# Patient Record
Sex: Female | Born: 1950
Health system: Southern US, Community
[De-identification: ages and names within clinical notes are randomized; demographics above are authoritative.]

## PROBLEM LIST (undated history)

## (undated) DIAGNOSIS — Z8711 Personal history of peptic ulcer disease: Secondary | ICD-10-CM

## (undated) DIAGNOSIS — K829 Disease of gallbladder, unspecified: Secondary | ICD-10-CM

## (undated) DIAGNOSIS — N979 Female infertility, unspecified: Secondary | ICD-10-CM

## (undated) DIAGNOSIS — Z9289 Personal history of other medical treatment: Secondary | ICD-10-CM

## (undated) DIAGNOSIS — Z9889 Other specified postprocedural states: Secondary | ICD-10-CM

## (undated) DIAGNOSIS — I493 Ventricular premature depolarization: Secondary | ICD-10-CM

## (undated) DIAGNOSIS — K219 Gastro-esophageal reflux disease without esophagitis: Secondary | ICD-10-CM

## (undated) DIAGNOSIS — H8110 Benign paroxysmal vertigo, unspecified ear: Secondary | ICD-10-CM

## (undated) DIAGNOSIS — E039 Hypothyroidism, unspecified: Secondary | ICD-10-CM

## (undated) DIAGNOSIS — K589 Irritable bowel syndrome without diarrhea: Secondary | ICD-10-CM

## (undated) DIAGNOSIS — Z8659 Personal history of other mental and behavioral disorders: Secondary | ICD-10-CM

## (undated) DIAGNOSIS — E559 Vitamin D deficiency, unspecified: Secondary | ICD-10-CM

## (undated) DIAGNOSIS — R202 Paresthesia of skin: Secondary | ICD-10-CM

## (undated) DIAGNOSIS — M545 Low back pain, unspecified: Secondary | ICD-10-CM

## (undated) DIAGNOSIS — H8109 Meniere's disease, unspecified ear: Secondary | ICD-10-CM

## (undated) DIAGNOSIS — E88819 Insulin resistance, unspecified: Secondary | ICD-10-CM

## (undated) DIAGNOSIS — I491 Atrial premature depolarization: Secondary | ICD-10-CM

## (undated) DIAGNOSIS — I10 Essential (primary) hypertension: Secondary | ICD-10-CM

## (undated) DIAGNOSIS — Z87898 Personal history of other specified conditions: Secondary | ICD-10-CM

## (undated) DIAGNOSIS — T8859XA Other complications of anesthesia, initial encounter: Secondary | ICD-10-CM

## (undated) DIAGNOSIS — E785 Hyperlipidemia, unspecified: Secondary | ICD-10-CM

## (undated) DIAGNOSIS — E8881 Metabolic syndrome: Secondary | ICD-10-CM

## (undated) DIAGNOSIS — R7303 Prediabetes: Secondary | ICD-10-CM

## (undated) DIAGNOSIS — M25559 Pain in unspecified hip: Secondary | ICD-10-CM

## (undated) DIAGNOSIS — R112 Nausea with vomiting, unspecified: Secondary | ICD-10-CM

## (undated) DIAGNOSIS — K635 Polyp of colon: Secondary | ICD-10-CM

## (undated) DIAGNOSIS — F419 Anxiety disorder, unspecified: Secondary | ICD-10-CM

## (undated) HISTORY — DX: Benign paroxysmal vertigo, unspecified ear: H81.10

## (undated) HISTORY — DX: Meniere's disease, unspecified ear: H81.09

## (undated) HISTORY — DX: Personal history of other mental and behavioral disorders: Z86.59

## (undated) HISTORY — DX: Atrial premature depolarization: I49.1

## (undated) HISTORY — DX: Hypothyroidism, unspecified: E03.9

## (undated) HISTORY — DX: Ventricular premature depolarization: I49.3

## (undated) HISTORY — DX: Personal history of other medical treatment: Z92.89

## (undated) HISTORY — DX: Vitamin D deficiency, unspecified: E55.9

## (undated) HISTORY — DX: Disease of gallbladder, unspecified: K82.9

## (undated) HISTORY — PX: REFRACTIVE SURGERY: SHX103

## (undated) HISTORY — DX: Pain in unspecified hip: M25.559

## (undated) HISTORY — DX: Personal history of other specified conditions: Z87.898

## (undated) HISTORY — DX: Paresthesia of skin: R20.2

## (undated) HISTORY — DX: Gastro-esophageal reflux disease without esophagitis: K21.9

## (undated) HISTORY — DX: Low back pain, unspecified: M54.50

## (undated) HISTORY — DX: Anxiety disorder, unspecified: F41.9

## (undated) HISTORY — PX: REDUCTION MAMMAPLASTY: SUR839

## (undated) HISTORY — DX: Insulin resistance, unspecified: E88.819

## (undated) HISTORY — DX: Prediabetes: R73.03

## (undated) HISTORY — DX: Essential (primary) hypertension: I10

## (undated) HISTORY — DX: Polyp of colon: K63.5

## (undated) HISTORY — DX: Female infertility, unspecified: N97.9

## (undated) HISTORY — DX: Irritable bowel syndrome, unspecified: K58.9

## (undated) HISTORY — DX: Other specified postprocedural states: Z98.890

## (undated) HISTORY — PX: OTHER SURGICAL HISTORY: SHX169

## (undated) HISTORY — DX: Hyperlipidemia, unspecified: E78.5

## (undated) HISTORY — DX: Personal history of peptic ulcer disease: Z87.11

## (undated) HISTORY — DX: Metabolic syndrome: E88.81

---
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
## 1973-10-07 HISTORY — PX: TONSILLECTOMY: SUR1361

## 1984-10-07 HISTORY — PX: FOOT NEUROMA SURGERY: SHX646

## 1994-10-07 HISTORY — PX: TOTAL ABDOMINAL HYSTERECTOMY: SHX209

## 1995-10-08 HISTORY — PX: BLADDER SURGERY: SHX569

## 1997-10-07 HISTORY — PX: BREAST REDUCTION SURGERY: SHX8

## 2009-10-07 HISTORY — PX: CHOLECYSTECTOMY: SHX55

## 2010-10-07 HISTORY — PX: THYROIDECTOMY: SHX17

## 2011-10-08 HISTORY — PX: BREAST BIOPSY: SHX20

## 2013-03-30 ENCOUNTER — Encounter: Payer: Self-pay | Admitting: Family Medicine

## 2013-07-03 LAB — HM PAP SMEAR: HM PAP: NEGATIVE

## 2013-08-18 ENCOUNTER — Encounter: Payer: Self-pay | Admitting: Family Medicine

## 2014-10-07 LAB — HM COLONOSCOPY

## 2015-05-08 HISTORY — PX: CARDIAC CATHETERIZATION: SHX172

## 2015-07-10 ENCOUNTER — Encounter: Payer: Self-pay | Admitting: Family Medicine

## 2015-07-17 ENCOUNTER — Encounter: Payer: Self-pay | Admitting: Family Medicine

## 2015-08-20 HISTORY — PX: COLONOSCOPY: SHX174

## 2016-01-24 ENCOUNTER — Encounter: Payer: Self-pay | Admitting: Family Medicine

## 2016-01-24 LAB — HM MAMMOGRAPHY

## 2016-07-08 LAB — HM DEXA SCAN

## 2017-01-13 HISTORY — PX: ESOPHAGOGASTRODUODENOSCOPY: SHX1529

## 2017-01-27 ENCOUNTER — Encounter: Payer: Self-pay | Admitting: Family Medicine

## 2017-08-05 LAB — FECAL OCCULT BLOOD, GUAIAC: FECAL OCCULT BLD: NEGATIVE

## 2017-10-03 ENCOUNTER — Encounter: Payer: Self-pay | Admitting: Family Medicine

## 2017-11-27 LAB — BASIC METABOLIC PANEL
BUN: 12 (ref 4–21)
CREATININE: 0.7 (ref 0.5–1.1)
Glucose: 78
POTASSIUM: 3.5 (ref 3.4–5.3)
SODIUM: 143 (ref 137–147)

## 2017-11-27 LAB — HEPATIC FUNCTION PANEL
ALT: 43 — AB (ref 7–35)
AST: 30 (ref 13–35)
Alkaline Phosphatase: 82 (ref 25–125)
Bilirubin, Total: 0.4

## 2017-11-27 LAB — LIPID PANEL
CHOLESTEROL: 186 (ref 0–200)
HDL: 61 (ref 35–70)
LDL Cholesterol: 92
Triglycerides: 164 — AB (ref 40–160)

## 2017-11-27 LAB — CBC AND DIFFERENTIAL
HEMATOCRIT: 41 (ref 36–46)
HEMOGLOBIN: 13.3 (ref 12.0–16.0)
Neutrophils Absolute: 3
Platelets: 248 (ref 150–399)
WBC: 6.4

## 2017-11-27 LAB — HEMOGLOBIN A1C: HEMOGLOBIN A1C: 5.7

## 2017-11-27 LAB — TSH: TSH: 0.49 (ref 0.41–5.90)

## 2018-02-25 ENCOUNTER — Ambulatory Visit: Payer: Self-pay | Admitting: Family Medicine

## 2018-02-25 ENCOUNTER — Encounter: Payer: Self-pay | Admitting: Family Medicine

## 2018-02-25 VITALS — BP 128/78 | HR 68 | Temp 98.6°F | Ht 64.0 in | Wt 170.0 lb

## 2018-02-25 DIAGNOSIS — R42 Dizziness and giddiness: Secondary | ICD-10-CM | POA: Diagnosis not present

## 2018-02-25 DIAGNOSIS — M79672 Pain in left foot: Secondary | ICD-10-CM

## 2018-02-25 DIAGNOSIS — E1159 Type 2 diabetes mellitus with other circulatory complications: Secondary | ICD-10-CM

## 2018-02-25 DIAGNOSIS — E1169 Type 2 diabetes mellitus with other specified complication: Secondary | ICD-10-CM

## 2018-02-25 DIAGNOSIS — I10 Essential (primary) hypertension: Secondary | ICD-10-CM

## 2018-02-25 DIAGNOSIS — E119 Type 2 diabetes mellitus without complications: Secondary | ICD-10-CM

## 2018-02-25 DIAGNOSIS — E039 Hypothyroidism, unspecified: Secondary | ICD-10-CM

## 2018-02-25 DIAGNOSIS — E785 Hyperlipidemia, unspecified: Secondary | ICD-10-CM

## 2018-02-25 DIAGNOSIS — I152 Hypertension secondary to endocrine disorders: Secondary | ICD-10-CM

## 2018-02-25 DIAGNOSIS — I493 Ventricular premature depolarization: Secondary | ICD-10-CM

## 2018-02-25 DIAGNOSIS — H579 Unspecified disorder of eye and adnexa: Secondary | ICD-10-CM

## 2018-02-25 NOTE — Progress Notes (Signed)
Mary Hernandez is a 67 y.o. female is here for TO ESTABLISH CARE.  History of Present Illness:   Lonell Grandchild, CMA acting as scribe for Dr. [name].   QVZ:DGLOVFI in office to establish care. She moved here from out of town to be closer to family. She was referred to our office by her daughter who is a nutritionist in the area.    Foot pain: her orthopedics in home town wanted her to have surgery to remove extra bone on foot. She would like to get referral to have addressed not what she has moved.   Vertigo:  has been evaluated by specialist in 2006 and has been on several medications that help with that. She feels like it has been going well but was interested in getting some PT to help with that.   Health Maintenance Due  Topic Date Due  . HEMOGLOBIN A1C  [birth year]  . Hepatitis C Screening  1951/06/19  . FOOT EXAM  06/22/1962  . OPHTHALMOLOGY EXAM  06/22/1962  . HIV Screening  06/23/1967  . TETANUS/TDAP  06/23/1971  . PAP SMEAR  06/22/1973  . MAMMOGRAM  06/22/2002  . COLONOSCOPY  06/22/2002  . DEXA SCAN  06/22/2017  . PNA vac Low Risk Adult (1 of 2 - PCV13) 06/22/2017     Depression screen PHQ 2/9 02/25/2018  Decreased Interest 0  Down, Depressed, Hopeless 0  PHQ - 2 Score 0     PMHx, SurgHx, SocialHx, FamHx, Medications, and Allergies were reviewed in the Visit Navigator and updated as appropriate.   Patient Active Problem List   Diagnosis Date Noted  . Type 2 diabetes mellitus without complication, without long-term current use of insulin ([place]) 03/01/2018  . Hypertension associated with diabetes ([place]) 03/01/2018  . Hyperlipidemia associated with type 2 diabetes mellitus ([place]) 03/01/2018  . Acquired hypothyroidism 03/01/2018  . Vertigo 03/01/2018  . Frequent PVCs 03/01/2018   Social History   Tobacco Use  . Smoking status: Never Smoker  . Smokeless tobacco: Never Used  Substance Use Topics  . Alcohol use: Yes    Comment: very limited   . Drug use: Not  on file   Current Medications and Allergies:   Current Outpatient Medications:  .  acebutolol (SECTRAL) 200 MG capsule, Take 200 mg by mouth daily., Disp: , Rfl:  .  benzonatate (TESSALON) 100 MG capsule, Take by mouth 3 (three) times daily as needed for cough., Disp: , Rfl:  .  Calcium Citrate-Vitamin D 315-250 MG-UNIT TABS, Take by mouth., Disp: , Rfl:  .  cetirizine (ZYRTEC) 10 MG tablet, Take 10 mg by mouth daily., Disp: , Rfl:  .  Evening Primrose Oil 1000 MG CAPS, Take 1,300 mg by mouth., Disp: , Rfl:  .  EVENING PRIMROSE OIL PO, Take 1,500 mg by mouth 2 (two) times daily., Disp: , Rfl:  .  flecainide (TAMBOCOR) 100 MG tablet, Take 100 mg by mouth 2 (two) times daily., Disp: , Rfl:  .  Glucosamine-Chondroit-Vit C-Mn (GLUCOSAMINE 1500 COMPLEX) CAPS, Take by mouth., Disp: , Rfl:  .  glycopyrrolate (ROBINUL) 1 MG tablet, Take 1 mg by mouth 2 (two) times daily., Disp: , Rfl:  .  levothyroxine (SYNTHROID, LEVOTHROID) 125 MCG tablet, Take 125 mcg by mouth daily before breakfast., Disp: , Rfl:  .  omega-3 acid ethyl esters (LOVAZA) 1 g capsule, Take 1 g by mouth 2 (two) times daily., Disp: , Rfl:  .  ondansetron (ZOFRAN) 8 MG tablet, Take by mouth every 8 (  eight) hours as needed for nausea or vomiting., Disp: , Rfl:  .  potassium chloride (K-DUR,KLOR-CON) 10 MEQ tablet, Take 10 mEq by mouth 2 (two) times daily., Disp: , Rfl:  .  pravastatin (PRAVACHOL) 10 MG tablet, Take 10 mg by mouth daily., Disp: , Rfl:  .  Probiotic Product (PROBIOTIC-10 PO), Take by mouth., Disp: , Rfl:  .  Semaglutide (OZEMPIC) 0.25 or 0.5 MG/DOSE SOPN, Inject 0.5 mg into the skin once a week., Disp: , Rfl:  .  triamterene-hydrochlorothiazide (DYAZIDE) 37.5-25 MG capsule, Take 1 capsule by mouth daily., Disp: , Rfl:   Allergies  Allergen Reactions  . Adhesive [Tape] Rash   Review of Systems   Pertinent items are noted in the HPI. Otherwise, ROS is negative.  Vitals:   Vitals:   02/25/18 1343  BP: 128/78    Pulse: 68  Temp: 98.6 F (37 C)  TempSrc: Oral  SpO2: 96%  Weight: 170 lb (77.1 kg)  Height: 5\' 4"  (1.626 m)     Body mass index is 29.18 kg/m.  Physical Exam:   Physical Exam  Constitutional: She is oriented to person, place, and time. She appears well-developed and well-nourished. No distress.  HENT:  Head: Normocephalic and atraumatic.  Right Ear: External ear normal.  Left Ear: External ear normal.  Nose: Nose normal.  Mouth/Throat: Oropharynx is clear and moist.  Eyes: Pupils are equal, round, and reactive to light. Conjunctivae and EOM are normal.  Neck: Normal range of motion. Neck supple. No thyromegaly present.  Cardiovascular: Normal rate, regular rhythm, normal heart sounds and intact distal pulses.  Pulmonary/Chest: Effort normal and breath sounds normal.  Abdominal: Soft. Bowel sounds are normal.  Musculoskeletal: Normal range of motion.  Lymphadenopathy:    She has no cervical adenopathy.  Neurological: She is alert and oriented to person, place, and time.  Skin: Skin is warm and dry. Capillary refill takes less than 2 seconds.  Psychiatric: She has a normal mood and affect. Her behavior is normal.  Nursing note and vitals reviewed.   Assessment and Plan:   [name] was seen today for establish care.  Diagnoses and all orders for this visit:  Frequent PVCs -     Ambulatory referral to Cardiology  Eye exam abnormal -     Ambulatory referral to Ophthalmology  Left foot pain -     Ambulatory referral to Orthopedic Surgery  Vertigo  Acquired hypothyroidism  Hyperlipidemia associated with type 2 diabetes mellitus ([place])  Hypertension associated with diabetes ([place])  Type 2 diabetes mellitus without complication, without long-term current use of insulin ([place])   . Reviewed expectations re: course of current medical issues. . Discussed self-management of symptoms. . Outlined signs and symptoms indicating need for more acute intervention. . Patient  verbalized understanding and all questions were answered. Marland Kitchen Health Maintenance issues including appropriate healthy diet, exercise, and smoking avoidance were discussed with patient. . See orders for this visit as documented in the electronic medical record. . Patient received an After Visit Summary.  [name], DO [place] 03/01/2018  Future Appointments  Date Time Provider [place]  03/09/2018  8:30 AM Newt Minion, MD [facility] None  08/28/2018 10:00 AM [name], DO [facility]   CMA served as scribe during this visit. History, Physical, and Plan performed by medical provider. The above documentation has been reviewed and is accurate and complete. [name], D.O.  Records requested if needed. Time spent with the patient: 30 minutes, of  which >50% was spent in obtaining information about her symptoms, reviewing her previous labs, evaluations, and treatments, counseling her about her condition (please see the discussed topics above), and developing a plan to further investigate it; she had a number of questions which I addressed.

## 2018-02-27 ENCOUNTER — Telehealth: Payer: Self-pay | Admitting: Family Medicine

## 2018-02-27 NOTE — Telephone Encounter (Signed)
I am unsure what the patient is calling about- this referral has not been sent to any office yet. If patient has/had a preference I will send it to whichever office she prefers.

## 2018-02-27 NOTE — Telephone Encounter (Signed)
I have updated referral sorry

## 2018-02-27 NOTE — Telephone Encounter (Unsigned)
Copied from [place] [id number]. Topic: Quick Communication - See Telephone Encounter >> Feb 27, 2018 10:19 AM Neva Seat wrote: Pt called Dr. Schuyler Amor - ophthalmology group [phone] ) -  they stated he is not a doctor in this group. Pt wanting to verify the correct number and if her information went to the correct office. Please call pt back to confirm asap.

## 2018-03-01 ENCOUNTER — Encounter: Payer: Self-pay | Admitting: Family Medicine

## 2018-03-01 DIAGNOSIS — E785 Hyperlipidemia, unspecified: Secondary | ICD-10-CM | POA: Insufficient documentation

## 2018-03-01 DIAGNOSIS — E782 Mixed hyperlipidemia: Secondary | ICD-10-CM | POA: Insufficient documentation

## 2018-03-01 DIAGNOSIS — I493 Ventricular premature depolarization: Secondary | ICD-10-CM | POA: Insufficient documentation

## 2018-03-01 DIAGNOSIS — I10 Essential (primary) hypertension: Secondary | ICD-10-CM

## 2018-03-01 DIAGNOSIS — E039 Hypothyroidism, unspecified: Secondary | ICD-10-CM

## 2018-03-01 DIAGNOSIS — R42 Dizziness and giddiness: Secondary | ICD-10-CM

## 2018-03-01 DIAGNOSIS — H8109 Meniere's disease, unspecified ear: Secondary | ICD-10-CM | POA: Insufficient documentation

## 2018-03-01 DIAGNOSIS — H8110 Benign paroxysmal vertigo, unspecified ear: Secondary | ICD-10-CM

## 2018-03-01 HISTORY — DX: Hypothyroidism, unspecified: E03.9

## 2018-03-01 HISTORY — DX: Essential (primary) hypertension: I10

## 2018-03-01 HISTORY — DX: Ventricular premature depolarization: I49.3

## 2018-03-01 HISTORY — DX: Benign paroxysmal vertigo, unspecified ear: H81.10

## 2018-03-01 HISTORY — DX: Dizziness and giddiness: R42

## 2018-03-06 ENCOUNTER — Encounter: Payer: Self-pay | Admitting: Family Medicine

## 2018-03-09 ENCOUNTER — Encounter: Payer: Self-pay | Admitting: Family Medicine

## 2018-03-09 ENCOUNTER — Ambulatory Visit (INDEPENDENT_AMBULATORY_CARE_PROVIDER_SITE_OTHER): Payer: PPO | Admitting: Orthopedic Surgery

## 2018-03-09 ENCOUNTER — Encounter (INDEPENDENT_AMBULATORY_CARE_PROVIDER_SITE_OTHER): Payer: Self-pay | Admitting: Orthopedic Surgery

## 2018-03-09 ENCOUNTER — Telehealth: Payer: Self-pay

## 2018-03-09 VITALS — Ht 64.0 in | Wt 170.0 lb

## 2018-03-09 DIAGNOSIS — E1142 Type 2 diabetes mellitus with diabetic polyneuropathy: Secondary | ICD-10-CM

## 2018-03-09 DIAGNOSIS — M6702 Short Achilles tendon (acquired), left ankle: Secondary | ICD-10-CM | POA: Diagnosis not present

## 2018-03-09 NOTE — Telephone Encounter (Signed)
Called patient she is not having issue any longer will call if any new problems.

## 2018-03-09 NOTE — Telephone Encounter (Signed)
Sent referral to scheduling attached notes to July file  

## 2018-03-09 NOTE — Progress Notes (Signed)
Office Visit Note   Patient: Mary Hernandez           Date of Birth: 1951/04/24           MRN: [id number] Visit Date: 03/09/2018              Requested by: [name], [address] [place], [place] [address] PCP: [name], DO  Chief Complaint  Patient presents with  . Left Foot - Pain      HPI: Patient is a 67 year old woman who presents complaining of pain base of the fifth metatarsal left foot.  She has been in a fracture boot she was seen in [place] an MRI scan was obtained and her treating physician felt that she was suffering from peroneal tendinitis from an os trigonum.  Patient's symptoms are on the plantar aspect of the fifth metatarsal base.  Assessment & Plan: Visit Diagnoses:  1. Achilles tendon contracture, left     Plan: Patient was given instructions of how to modify her orthotic to allow for plantarflexion of the first ray to unload the lateral column and she was given instruction and demonstrated Achilles stretching to do 5 times a day a minute at a time she is also to get a stiff soled Trail running sneaker.  Follow-Up Instructions: Return in about 2 months (around 05/09/2018).   Ortho Exam  Patient is alert, oriented, no adenopathy, well-dressed, normal affect, normal respiratory effort. Examination patient has a good dorsalis pedis and posterior tibial pulse she has good subtalar motion she has heel cord contracture with dorsiflexion about 10 degrees short of neutral with her knee extended she has good dorsiflexion of the knee flexed.  Patient has some tenderness to palpation over the third webspace this is where she has had a neuroma excised.  She is tender to palpation the plantar aspect of the base of the fifth metatarsal the peroneal tendons are nontender to palpation.  The Achilles tendon is nontender to palpation her os trigonum which is posterior medially is a little tender to palpation but is asymptomatic and not involved with her fifth  metatarsal base pain.  Patient's MRI scan was reviewed which showed no peroneal tendinitis or posterior tibial tendinitis the Achilles tendon is intact there is no evidence of any stress fractures.  Imaging: No results found. No images are attached to the encounter.  Labs: No results found for: HGBA1C, ESRSEDRATE, CRP, LABURIC, REPTSTATUS, GRAMSTAIN, CULT, LABORGA   No results found for: ALBUMIN, PREALBUMIN, LABURIC  Body mass index is 29.18 kg/m.  Orders:  No orders of the defined types were placed in this encounter.  No orders of the defined types were placed in this encounter.    Procedures: No procedures performed  Clinical Data: No additional findings.  ROS:  All other systems negative, except as noted in the HPI. Review of Systems  Objective: Vital Signs: Ht 5\' 4"  (1.626 m)   Wt 170 lb (77.1 kg)   BMI 29.18 kg/m   Specialty Comments:  No specialty comments available.  PMFS History: Patient Active Problem List   Diagnosis Date Noted  . Type 2 diabetes mellitus without complication, without long-term current use of insulin ([place]) 03/01/2018  . Hypertension associated with diabetes ([place]) 03/01/2018  . Hyperlipidemia associated with type 2 diabetes mellitus ([place]) 03/01/2018  . Acquired hypothyroidism 03/01/2018  . Vertigo 03/01/2018  . Frequent PVCs 03/01/2018   Past Medical History:  Diagnosis Date  . Acquired hypothyroidism 03/01/2018  . Frequent PVCs 03/01/2018  .  Hyperlipidemia associated with type 2 diabetes mellitus ([place]) 03/01/2018  . Hypertension associated with diabetes ([place]) 03/01/2018  . Type 2 diabetes mellitus without complication, without long-term current use of insulin ([place]) 03/01/2018  . Vertigo 03/01/2018    Family History  Problem Relation Age of Onset  . Cancer Mother   . Heart attack Father   . Early death Sister   . Depression Brother   . Hearing loss Brother   . Diabetes Daughter     Past Surgical History:  Procedure Laterality  Date  . BREAST BIOPSY  2013  . BREAST REDUCTION SURGERY  1999  . CHOLECYSTECTOMY  2011  . [place]  . S/P Eye Right 1967   Muscle Clipped   . THYROIDECTOMY  2012  . TONSILLECTOMY  1975  . TOTAL ABDOMINAL HYSTERECTOMY  1996   Social History   Occupational History  . Not on file  Tobacco Use  . Smoking status: Never Smoker  . Smokeless tobacco: Never Used  Substance and Sexual Activity  . Alcohol use: Yes    Comment: very limited   . Drug use: Not on file  . Sexual activity: Not on file

## 2018-03-19 ENCOUNTER — Other Ambulatory Visit: Payer: Self-pay

## 2018-03-19 ENCOUNTER — Encounter: Payer: Self-pay | Admitting: Family Medicine

## 2018-03-23 ENCOUNTER — Other Ambulatory Visit: Payer: Self-pay

## 2018-03-23 MED ORDER — SEMAGLUTIDE (1 MG/DOSE) 2 MG/1.5ML ~~LOC~~ SOPN: 1.0000 mg | PEN_INJECTOR | SUBCUTANEOUS | 1 refills | Status: DC

## 2018-03-24 ENCOUNTER — Encounter: Payer: Self-pay | Admitting: Family Medicine

## 2018-03-25 ENCOUNTER — Encounter: Payer: Self-pay | Admitting: Family Medicine

## 2018-03-25 DIAGNOSIS — H2513 Age-related nuclear cataract, bilateral: Secondary | ICD-10-CM | POA: Diagnosis not present

## 2018-03-25 DIAGNOSIS — H35371 Puckering of macula, right eye: Secondary | ICD-10-CM | POA: Diagnosis not present

## 2018-03-25 DIAGNOSIS — Z9889 Other specified postprocedural states: Secondary | ICD-10-CM | POA: Diagnosis not present

## 2018-03-26 ENCOUNTER — Telehealth: Payer: Self-pay | Admitting: Physician Assistant

## 2018-03-26 NOTE — Telephone Encounter (Signed)
Records received from [facility]. Pt appointment with Richardson Dopp PA-C. Placed in Chart prep.

## 2018-03-27 ENCOUNTER — Other Ambulatory Visit: Payer: Self-pay

## 2018-03-27 LAB — HM DIABETES EYE EXAM

## 2018-03-27 MED ORDER — POTASSIUM CHLORIDE CRYS ER 10 MEQ PO TBCR: 10.0000 meq | EXTENDED_RELEASE_TABLET | Freq: Two times a day (BID) | ORAL | 1 refills | Status: DC

## 2018-03-27 MED ORDER — FLECAINIDE ACETATE 100 MG PO TABS: 100.0000 mg | ORAL_TABLET | Freq: Two times a day (BID) | ORAL | 3 refills | Status: DC

## 2018-03-27 MED ORDER — LEVOTHYROXINE SODIUM 125 MCG PO TABS: 125.0000 ug | ORAL_TABLET | Freq: Every day | ORAL | 1 refills | Status: DC

## 2018-03-30 ENCOUNTER — Encounter: Payer: Self-pay | Admitting: Surgical

## 2018-03-30 ENCOUNTER — Encounter: Payer: Self-pay | Admitting: Family Medicine

## 2018-03-30 DIAGNOSIS — M1611 Unilateral primary osteoarthritis, right hip: Secondary | ICD-10-CM | POA: Insufficient documentation

## 2018-03-30 DIAGNOSIS — N63 Unspecified lump in unspecified breast: Secondary | ICD-10-CM | POA: Insufficient documentation

## 2018-03-30 DIAGNOSIS — K219 Gastro-esophageal reflux disease without esophagitis: Secondary | ICD-10-CM | POA: Insufficient documentation

## 2018-03-30 DIAGNOSIS — K5790 Diverticulosis of intestine, part unspecified, without perforation or abscess without bleeding: Secondary | ICD-10-CM | POA: Insufficient documentation

## 2018-03-30 DIAGNOSIS — K2960 Other gastritis without bleeding: Secondary | ICD-10-CM | POA: Insufficient documentation

## 2018-03-31 ENCOUNTER — Encounter: Payer: Self-pay | Admitting: Surgical

## 2018-04-01 ENCOUNTER — Encounter: Payer: Self-pay | Admitting: Family Medicine

## 2018-04-01 ENCOUNTER — Encounter: Payer: Self-pay | Admitting: Physician Assistant

## 2018-04-01 NOTE — Telephone Encounter (Signed)
Please check to see if the patient's records have arrived and let her know. Thanks Richardson Dopp, PA-C    04/01/2018 5:17 PM

## 2018-04-03 ENCOUNTER — Encounter (INDEPENDENT_AMBULATORY_CARE_PROVIDER_SITE_OTHER): Payer: Self-pay | Admitting: Orthopedic Surgery

## 2018-04-03 ENCOUNTER — Encounter: Payer: Self-pay | Admitting: Family Medicine

## 2018-04-03 ENCOUNTER — Ambulatory Visit (INDEPENDENT_AMBULATORY_CARE_PROVIDER_SITE_OTHER): Payer: PPO | Admitting: Orthopedic Surgery

## 2018-04-03 VITALS — Ht 64.0 in | Wt 170.0 lb

## 2018-04-03 DIAGNOSIS — E1142 Type 2 diabetes mellitus with diabetic polyneuropathy: Secondary | ICD-10-CM

## 2018-04-03 DIAGNOSIS — M6702 Short Achilles tendon (acquired), left ankle: Secondary | ICD-10-CM | POA: Diagnosis not present

## 2018-04-03 NOTE — Progress Notes (Signed)
Office Visit Note   Patient: Mary Hernandez           Date of Birth: [birth year]           MRN: [id number] Visit Date: 04/03/2018              Requested by: [name], [address] [place], [place] [address] PCP: [name], DO  Chief Complaint  Patient presents with  . Left Foot - Pain      HPI: Patient is a 67 year old woman who presents in follow-up for heel cord contracture on the left.  Patient states she is been doing her heel cord stretching she has modified her orthotics she is wearing the [place] shoes and feels like she is making slow steady progress.  Assessment & Plan: Visit Diagnoses:  1. Achilles tendon contracture, left   2. Diabetic polyneuropathy associated with type 2 diabetes mellitus ([place])     Plan: Continue with the stretching continue with her shoe wear follow-up if her symptoms worsen or she fails to steadily improve.  Follow-Up Instructions: Return if symptoms worsen or fail to improve.   Ortho Exam  Patient is alert, oriented, no adenopathy, well-dressed, normal affect, normal respiratory effort. Examination she has a good pulse she now has dorsiflexion 20 degrees past neutral with excellent improvement she has a relief cut out in her orthotic to allow for plantarflexion of the first ray to unload the lateral column.  The lateral column is still symptomatic but is feeling better.  Imaging: No results found. No images are attached to the encounter.  Labs: Lab Results  Component Value Date   HGBA1C 5.7 11/27/2017     No results found for: ALBUMIN, PREALBUMIN, LABURIC  Body mass index is 29.18 kg/m.  Orders:  No orders of the defined types were placed in this encounter.  No orders of the defined types were placed in this encounter.    Procedures: No procedures performed  Clinical Data: No additional findings.  ROS:  All other systems negative, except as noted in the HPI. Review of Systems  Objective: Vital Signs: Ht  5\' 4"  (1.626 m)   Wt 170 lb (77.1 kg)   BMI 29.18 kg/m   Specialty Comments:  No specialty comments available.  PMFS History: Patient Active Problem List   Diagnosis Date Noted  . Arthritis of right hip 03/30/2018  . Breast nodule 03/30/2018  . Diverticulosis 03/30/2018  . GERD (gastroesophageal reflux disease) 03/30/2018  . Nonerosive nonspecific gastritis 03/30/2018  . Type 2 diabetes mellitus without complication, without long-term current use of insulin ([place]) 03/01/2018  . Hypertension associated with diabetes ([place]) 03/01/2018  . Hyperlipidemia associated with type 2 diabetes mellitus ([place]) 03/01/2018  . Acquired hypothyroidism 03/01/2018  . Vertigo 03/01/2018  . Frequent PVCs 03/01/2018   Past Medical History:  Diagnosis Date  . Acquired hypothyroidism 03/01/2018  . Atrial premature depolarization   . Frequent PVCs 03/01/2018  . History of chest pain   . History of depression   . History of dizziness   . History of palpitations   . History of shortness of breath   . Hyperlipidemia associated with type 2 diabetes mellitus ([place]) 03/01/2018  . Hypertension associated with diabetes ([place]) 03/01/2018  . Type 2 diabetes mellitus without complication, without long-term current use of insulin ([place]) 03/01/2018  . Vertigo 03/01/2018    Family History  Problem Relation Age of Onset  . Breast cancer Mother   . Heart attack Father   .  Pulmonary fibrosis Father   . Early death Sister   . Depression Brother   . Hearing loss Brother   . Diabetes Daughter     Past Surgical History:  Procedure Laterality Date  . BREAST BIOPSY  2013  . BREAST REDUCTION SURGERY  1999  . CARDIAC CATHETERIZATION  05/2015  . Carpel Tunnel Release    . CHOLECYSTECTOMY  2011  . [place]  . REDUCTION MAMMAPLASTY    . REFRACTIVE SURGERY    . S/P Eye Right 1967   Muscle Clipped   . THYROIDECTOMY  2012  . TONSILLECTOMY  1975  . TOTAL ABDOMINAL HYSTERECTOMY  1996   Social History    Occupational History  . Not on file  Tobacco Use  . Smoking status: Never Smoker  . Smokeless tobacco: Never Used  Substance and Sexual Activity  . Alcohol use: Yes    Comment: very limited   . Drug use: Not on file  . Sexual activity: Not on file

## 2018-04-08 ENCOUNTER — Ambulatory Visit: Payer: PPO | Admitting: Physician Assistant

## 2018-04-08 ENCOUNTER — Encounter

## 2018-04-08 ENCOUNTER — Encounter: Payer: Self-pay | Admitting: Physician Assistant

## 2018-04-08 VITALS — BP 116/74 | HR 68 | Ht 64.0 in | Wt 176.8 lb

## 2018-04-08 DIAGNOSIS — I493 Ventricular premature depolarization: Secondary | ICD-10-CM | POA: Diagnosis not present

## 2018-04-08 DIAGNOSIS — E785 Hyperlipidemia, unspecified: Secondary | ICD-10-CM

## 2018-04-08 DIAGNOSIS — E119 Type 2 diabetes mellitus without complications: Secondary | ICD-10-CM | POA: Diagnosis not present

## 2018-04-08 NOTE — Patient Instructions (Addendum)
Medication Instructions:  Your physician recommends that you continue on your current medications as directed. Please refer to the Current Medication list given to you today.   Labwork: NONE ORDERED TODAY  Testing/Procedures: NONE ORDERED TODAY  Follow-Up: DR. Caryl Comes IN 3 MONTHS   Any Other Special Instructions Will Be Listed Below (If Applicable). YOU HAVE SIGNED A RELEASE OF INFORMATION SO THAT WE MAY OBTAIN EKG'S FROM 2015-2016 FROM [facility]     If you need a refill on your cardiac medications before your next appointment, please call your pharmacy.

## 2018-04-08 NOTE — Progress Notes (Signed)
Cardiology Office Note:    Date:  04/08/2018   ID:  Mary Hernandez, DOB [birth year], MRN [id number]  PCP:  Mary Deutscher, DO  Cardiologist: Mary Peach, MD   Referring MD: Mary Deutscher, DO   Chief Complaint  Patient presents with  . PVCs    History of Present Illness:    Mary Hernandez is a 67 y.o. female with symptomatic PVCs, diabetes, hyperlipidemia who is being seen today to establish with Cardiology at the request of Mary Deutscher, DO.   Mary Hernandez was previously followed by Dr. Gershon Hernandez at [facility] in [place].  She has a history of symptomatic PACs and PVCs controlled on flecainide and a sputum wall.  Cardiac catheterization in August 2016 demonstrated normal coronary arteries and normal LV systolic function.  Nuclear stress test in July 2016 demonstrated normal perfusion and EF 76%.  Echocardiogram in June 2007 demonstrated mildly dilated right ventricle, normal LV systolic function, trace MR.  She moved to [facility] in May 2019 to be closer to her children and grandchildren.  She is here today with her husband.  She has remained asymptomatic on Flecainide and Acebutolol.  She denies palpitations, chest pain, shortness of breath, syncope, paroxysmal nocturnal dyspnea.  She has had some foot issues and is followed by Mary Hernandez.    PAD Screen 04/08/2018  Previous PAD dx? No  Previous surgical procedure? No  Pain with walking? No  Feet/toe relief with dangling? No  Painful, non-healing ulcers? No  Extremities discolored? No    Prior CV studies:   The following studies were reviewed today:  Cardiac Catheterization 05/2015 Normal coronary arteries Normal LVEF  Nuclear stress test 04/2015 Normal perfusion, EF 76  Echo 03/2006 Normal LVEF, trace MR  Past Medical History:  Diagnosis Date  . Acquired hypothyroidism 03/01/2018   hx of goiter; s/p thyroidectomy  . Atrial premature depolarization   . BPPV (benign paroxysmal  positional vertigo) 03/01/2018  . DM2 (diabetes mellitus, type 2) ([place]) 03/01/2018  . Essential hypertension 03/01/2018   denies hx - on diuretic for Meniere's and beta blocker for PVCs  . Frequent PVCs 03/01/2018   Controlled on Flecainide, Acebutolol  . History of cardiac catheterization    Nuc 7/16:  normal perfusion; EF 76 // LHC 8/16:  normal coronary arteries  . History of depression   . History of dizziness   . History of echocardiogram    Echo 03/2006:  Normal LVEF  . History of shortness of breath   . HLD (hyperlipidemia)   . Meniere's disease     Past Surgical History:  Procedure Laterality Date  . BREAST BIOPSY  2013  . BREAST REDUCTION SURGERY  1999  . CARDIAC CATHETERIZATION  05/2015  . Carpel Tunnel Release    . CHOLECYSTECTOMY  2011  . [place]  . REDUCTION MAMMAPLASTY    . REFRACTIVE SURGERY    . S/P Eye Right 1967   Muscle Clipped   . THYROIDECTOMY  2012  . TONSILLECTOMY  1975  . TOTAL ABDOMINAL HYSTERECTOMY  1996    Current Medications: Current Meds  Medication Sig  . acebutolol (SECTRAL) 200 MG capsule Take 200 mg by mouth daily.  . benzonatate (TESSALON) 100 MG capsule Take by mouth 3 (three) times daily as needed for cough.  . Calcium Citrate-Vitamin D 315-250 MG-UNIT TABS Take by mouth.  . cetirizine (ZYRTEC) 10 MG tablet Take 10 mg by mouth daily.  . Evening Primrose  Oil 1000 MG CAPS Take 1,300 mg by mouth.  . EVENING PRIMROSE OIL PO Take 1,500 mg by mouth 2 (two) times daily.  . flecainide (TAMBOCOR) 100 MG tablet Take 1 tablet (100 mg total) by mouth 2 (two) times daily.  . Glucosamine-Chondroit-Vit C-Mn (GLUCOSAMINE 1500 COMPLEX) CAPS Take by mouth.  Marland Kitchen glycopyrrolate (ROBINUL) 1 MG tablet Take 1 mg by mouth 2 (two) times daily.  Marland Kitchen levothyroxine (SYNTHROID, LEVOTHROID) 125 MCG tablet Take 1 tablet (125 mcg total) by mouth daily before breakfast.  . omega-3 acid ethyl esters (LOVAZA) 1 g capsule Take 1 g by mouth 2 (two) times  daily.  . ondansetron (ZOFRAN) 8 MG tablet Take by mouth every 8 (eight) hours as needed for nausea or vomiting.  . potassium chloride (K-DUR,KLOR-CON) 10 MEQ tablet Take 1 tablet (10 mEq total) by mouth 2 (two) times daily.  . pravastatin (PRAVACHOL) 10 MG tablet Take 10 mg by mouth daily.  . Probiotic Product (PROBIOTIC-10 PO) Take by mouth.  . Semaglutide (OZEMPIC) 1 MG/DOSE SOPN Inject 1 mg into the skin once a week.  . triamterene-hydrochlorothiazide (DYAZIDE) 37.5-25 MG capsule Take 1 capsule by mouth daily.     Allergies:   Adhesive [tape]   Social History   Socioeconomic History  . Marital status: Married    Spouse name: Not on file  . Number of children: 2  . Years of education: Not on file  . Highest education level: Not on file  Occupational History  . Occupation: Retired  Scientific laboratory technician  . Financial resource strain: Not on file  . Food insecurity:    Worry: Not on file    Inability: Not on file  . Transportation needs:    Medical: Not on file    Non-medical: Not on file  Tobacco Use  . Smoking status: Never Smoker  . Smokeless tobacco: Never Used  Substance and Sexual Activity  . Alcohol use: Yes    Comment: very limited   . Drug use: Not on file  . Sexual activity: Not on file  Lifestyle  . Physical activity:    Days per week: Not on file    Minutes per session: Not on file  . Stress: Not on file  Relationships  . Social connections:    Talks on phone: Not on file    Gets together: Not on file    Attends religious service: Not on file    Active member of club or organization: Not on file    Attends meetings of clubs or organizations: Not on file    Relationship status: Not on file  Other Topics Concern  . 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  ROS:   Please see the history of present illness.    ROS All other systems reviewed and are negative.   EKGs/Labs/Other Test Reviewed:    EKG:  EKG is  ordered today.  The ekg ordered today demonstrates normal sinus rhythm,  HR 68, LAD, IVCD, QRS 136 ms, QTc 476 ms, septal Q waves  Recent Labs: 11/27/2017: ALT 43; BUN 12; Creatinine 0.7; Hemoglobin 13.3; Platelets 248; Potassium 3.5; Sodium 143; TSH 0.49   Recent Lipid Panel Lab Results  Component Value Date/Time   CHOL 186 11/27/2017   TRIG 164 (A) 11/27/2017   HDL 61 11/27/2017   LDLCALC 92 11/27/2017    Physical Exam:    VS:  BP 116/74   Pulse 68   Ht 5\' 4"  (1.626 m)   Wt 176 lb 12.8 oz (80.2 kg)   SpO2 95%   BMI 30.35 kg/m     Wt Readings from Last 3 Encounters:  04/08/18 176 lb 12.8 oz (80.2 kg)  04/03/18 170 lb (77.1 kg)  03/09/18 170 lb (77.1 kg)     Physical Exam  Constitutional: She is oriented to person, place, and time. She appears well-developed and well-nourished. No distress.  HENT:  Head: Normocephalic and atraumatic.  Neck: No JVD present. Carotid bruit is not present.  Cardiovascular: Normal rate, regular rhythm and normal heart sounds.  No murmur heard. Pulmonary/Chest: Effort normal and breath sounds normal. She has no rales.  Abdominal: Soft. There is no hepatomegaly.  Musculoskeletal: She exhibits no edema.  Neurological: She is alert and oriented to person, place, and time.  Skin: Skin is warm and dry.    ASSESSMENT & PLAN:    Frequent PVCs  History of symptomatic PVCs.  She has been controlled on Flecainide and Acebutolol.  She has a hx of Cardiac Catheterization in 2016 with normal coronary arteries.   Continue current medical regimen. We can refill her Flecainide and Acebutolol if her primary doctor would rather.  I reviewed her case today with Dr. Caryl Comes (attending MD).  We reviewed her ECGs.  Her QRS is somewhat wide.  We will request prior ECGs predating her initiation of flecainide.  I will have her follow-up with Dr. Caryl Comes in 3 months to determine if her flecainide dose needs to be adjusted.  Type 2 diabetes mellitus without complication, without long-term current use of insulin (HCC) Recent A1c < 6 on GLP1 agonist.  Continue follow up with primary care.  Hyperlipidemia, unspecified hyperlipidemia type Continue statin.  Managed by primary care.   Dispo:  Return in about 3 months (around 07/09/2018) for Routine Follow Up w/ Dr. Caryl Comes.   Medication Adjustments/Labs and Tests Ordered: Current medicines are reviewed at length with the patient today.  Concerns regarding medicines are outlined above.  Orders/Tests:  Orders Placed This Encounter  Procedures  . EKG 12-Lead   Medication changes: No orders of the defined types were placed in this encounter.  Signed, Richardson Dopp, PA-C  04/08/2018 5:00 PM    [address], [place], [place]  [address] Phone: [phone]; Fax: [phone]

## 2018-04-20 ENCOUNTER — Ambulatory Visit: Payer: Self-pay | Admitting: Family Medicine

## 2018-04-22 ENCOUNTER — Encounter: Payer: Self-pay | Admitting: Family Medicine

## 2018-04-23 NOTE — Telephone Encounter (Signed)
Called patient to get more information number busy.

## 2018-04-27 ENCOUNTER — Encounter: Payer: Self-pay | Admitting: Family Medicine

## 2018-04-29 NOTE — Telephone Encounter (Signed)
Patient called back states she never received a message back in regards to this. Patient scheduled appointment for 05/01/18.

## 2018-04-30 NOTE — Progress Notes (Signed)
I acted as a Education administrator for Dr. Harriette Ohara, LPN  Jermiya Reichl is a 67 y.o. female is here for follow up.  History of Present Illness:   Ear Fullness   There is pain in the left ear. Chronicity: Sarted 2 weeks ago. The problem occurs constantly. The problem has been gradually worsening. There has been no fever. The pain is at a severity of 0/10. The patient is experiencing no pain. Associated symptoms include headaches and hearing loss (Left ear feels muffled). Pertinent negatives include no coughing, diarrhea, ear discharge, sore throat or vomiting. Treatments tried: zyrtec. Has inner ear problem  Back Pain  This is a chronic (Right hip sciatica pain) problem. The problem occurs constantly. The problem has been gradually worsening (past couple of weeks) since onset. The quality of the pain is described as aching. The pain radiates to the right thigh. Pain scale: pain worsens with activity can go from a 3 to a 6. The pain is moderate. The pain is the same all the time (depends on activity). The symptoms are aggravated by sitting, position and standing. Stiffness is present in the morning. Associated symptoms include headaches and leg pain (back of right thigh). Pertinent negatives include no numbness or tingling. Treatments tried: exercises at the poop and stretching occassonally, Tylenol at night. The treatment provided mild relief.   Health Maintenance Due  Topic Date Due  . Hepatitis C Screening  15-Apr-1951  . OPHTHALMOLOGY EXAM  06/22/1962  . URINE MICROALBUMIN  06/22/1962  . HIV Screening  06/23/1967  . COLONOSCOPY  06/22/2002  . DEXA SCAN  06/22/2017  . PNA vac Low Risk Adult (1 of 2 - PCV13) 06/22/2017   Depression screen PHQ 2/9 02/25/2018  Decreased Interest 0  Down, Depressed, Hopeless 0  PHQ - 2 Score 0   PMHx, SurgHx, SocialHx, FamHx, Medications, and Allergies were reviewed in the Visit Navigator and updated as appropriate.   Patient Active Problem List   Diagnosis Date Noted  . Arthritis of right hip 03/30/2018  . Breast nodule 03/30/2018  . Diverticulosis 03/30/2018  . GERD (gastroesophageal reflux disease) 03/30/2018  . Nonerosive nonspecific gastritis 03/30/2018  . Type 2 diabetes mellitus without complication, without long-term current use of insulin ([place]) 03/01/2018  . Hypertension associated with diabetes ([place]) 03/01/2018  . Hyperlipidemia associated with type 2 diabetes mellitus ([place]) 03/01/2018  . Acquired hypothyroidism 03/01/2018  . Vertigo 03/01/2018  . Frequent PVCs 03/01/2018   Social History   Tobacco Use  . Smoking status: Never Smoker  . Smokeless tobacco: Never Used  Substance Use Topics  . Alcohol use: Yes    Comment: very limited   . Drug use: Not on file   Current Medications and Allergies:   Current Outpatient Medications:  .  acebutolol (SECTRAL) 200 MG capsule, Take 200 mg by mouth daily., Disp: , Rfl:  .  benzonatate (TESSALON) 100 MG capsule, Take by mouth 3 (three) times daily as needed for cough., Disp: , Rfl:  .  Calcium Citrate-Vitamin D 315-250 MG-UNIT TABS, Take by mouth., Disp: , Rfl:  .  cetirizine (ZYRTEC) 10 MG tablet, Take 10 mg by mouth daily., Disp: , Rfl:  .  Evening Primrose Oil 1000 MG CAPS, Take 1,300 mg by mouth., Disp: , Rfl:  .  EVENING PRIMROSE OIL PO, Take 1,500 mg by mouth 2 (two) times daily., Disp: , Rfl:  .  flecainide (TAMBOCOR) 100 MG tablet, Take 1 tablet (100 mg total) by mouth 2 (two) times  daily., Disp: 60 tablet, Rfl: 3 .  Glucosamine-Chondroit-Vit C-Mn (GLUCOSAMINE 1500 COMPLEX) CAPS, Take by mouth., Disp: , Rfl:  .  glycopyrrolate (ROBINUL) 1 MG tablet, Take 1 mg by mouth 2 (two) times daily., Disp: , Rfl:  .  levothyroxine (SYNTHROID, LEVOTHROID) 125 MCG tablet, Take 1 tablet (125 mcg total) by mouth daily before breakfast., Disp: 90 tablet, Rfl: 1 .  omega-3 acid ethyl esters (LOVAZA) 1 g capsule, Take 1 g by mouth 2 (two) times daily., Disp: , Rfl:  .  ondansetron  (ZOFRAN) 8 MG tablet, Take by mouth every 8 (eight) hours as needed for nausea or vomiting., Disp: , Rfl:  .  potassium chloride (K-DUR,KLOR-CON) 10 MEQ tablet, Take 1 tablet (10 mEq total) by mouth 2 (two) times daily., Disp: 90 tablet, Rfl: 1 .  pravastatin (PRAVACHOL) 10 MG tablet, Take 10 mg by mouth daily., Disp: , Rfl:  .  Probiotic Product (PROBIOTIC-10 PO), Take by mouth., Disp: , Rfl:  .  Semaglutide (OZEMPIC) 1 MG/DOSE SOPN, Inject 1 mg into the skin once a week., Disp: 6 pen, Rfl: 1 .  triamterene-hydrochlorothiazide (DYAZIDE) 37.5-25 MG capsule, Take 1 capsule by mouth daily., Disp: , Rfl:  .  fluticasone (FLONASE) 50 MCG/ACT nasal spray, Place 2 sprays into both nostrils daily., Disp: 16 g, Rfl: 6 .  traMADol (ULTRAM) 50 MG tablet, Take 1 tablet (50 mg total) by mouth every 8 (eight) hours as needed., Disp: 30 tablet, Rfl: 0   Allergies  Allergen Reactions  . Adhesive [Tape] Rash   Review of Systems   Pertinent items are noted in the HPI. Otherwise, ROS is negative.  Vitals:   Vitals:   05/01/18 0920  BP: 120/70  Pulse: 69  Temp: 98.1 F (36.7 C)  TempSrc: Oral  SpO2: 95%  Weight: 175 lb (79.4 kg)  Height: 5\' 4"  (1.626 m)     Body mass index is 30.04 kg/m.  Physical Exam:   Physical Exam  Constitutional: She appears well-nourished.  HENT:  Head: Normocephalic and atraumatic.  Eyes: Pupils are equal, round, and reactive to light. EOM are normal.  Neck: Normal range of motion. Neck supple.  Cardiovascular: Normal rate, regular rhythm, normal heart sounds and intact distal pulses.  Pulmonary/Chest: Effort normal.  Abdominal: Soft.  Skin: Skin is warm.  Psychiatric: She has a normal mood and affect. Her behavior is normal.  Nursing note and vitals reviewed.    Diabetic Foot Exam - Simple   Simple Foot Form Diabetic Foot exam was performed with the following findings:  Yes 05/01/2018  9:23 AM  Visual Inspection No deformities, no ulcerations, no other skin  breakdown bilaterally:  Yes Sensation Testing Intact to touch and monofilament testing bilaterally:  Yes Pulse Check Posterior Tibialis and Dorsalis pulse intact bilaterally:  Yes Comments     Results for orders placed or performed in visit on 03/31/18  CBC and differential  Result Value Ref Range   Hemoglobin 13.3 12.0 - 16.0   HCT 41 36 - 46   Neutrophils Absolute 3    Platelets 248 150 - 399   WBC 6.4   Basic metabolic panel  Result Value Ref Range   Glucose 78    BUN 12 4 - 21   Creatinine 0.7 0.5 - 1.1   Potassium 3.5 3.4 - 5.3   Sodium 143 137 - 147  Lipid panel  Result Value Ref Range   Triglycerides 164 (A) 40 - 160   Cholesterol 186 0 - 200  HDL 61 35 - 70   LDL Cholesterol 92   Hepatic function panel  Result Value Ref Range   Alkaline Phosphatase 82 25 - 125   ALT 43 (A) 7 - 35   AST 30 13 - 35   Bilirubin, Total 0.4   Hemoglobin A1c  Result Value Ref Range   Hemoglobin A1C 5.7   TSH  Result Value Ref Range   TSH 0.49 0.41 - 5.90    Assessment and Plan:   Mary Hernandez was seen today for left ear problem.  Diagnoses and all orders for this visit:  Acquired hypothyroidism -     TSH -     T4, free  Hyperlipidemia associated with type 2 diabetes mellitus ([place]) -     Lipid panel  Type 2 diabetes mellitus without complication, without long-term current use of insulin (HCC) -     CBC with Differential/Platelet -     Comprehensive metabolic panel -     Hemoglobin A1c -     Microalbumin / creatinine urine ratio  Hypertension associated with diabetes ([place]) -     Comprehensive metabolic panel  Encounter for hepatitis C virus screening test for high risk patient -     Hepatitis C antibody  Screening for HIV (human immunodeficiency virus) -     HIV antibody  Estrogen deficiency -     DG Bone Density; Future  Screening for malignant neoplasm of colon -     Ambulatory referral to Gastroenterology  Right sided sciatica -     traMADol (ULTRAM) 50 MG  tablet; Take 1 tablet (50 mg total) by mouth every 8 (eight) hours as needed. -     Ambulatory referral to Sports Medicine  ETD (Eustachian tube dysfunction), left -     fluticasone (FLONASE) 50 MCG/ACT nasal spray; Place 2 sprays into both nostrils daily.    . Reviewed expectations re: course of current medical issues. . Discussed self-management of symptoms. . Outlined signs and symptoms indicating need for more acute intervention. . Patient verbalized understanding and all questions were answered. Marland Kitchen Health Maintenance issues including appropriate healthy diet, exercise, and smoking avoidance were discussed with patient. . See orders for this visit as documented in the electronic medical record. . Patient received an After Visit Summary.  [name], DO [place] [place] [facility] 05/01/2018  Future Appointments  Date Time Provider [place]  05/28/2018  9:30 AM [facility] DEXA 1 [facility]  05/29/2018  9:40 AM [name], DO [facility]  05/29/2018 10:40 AM Gerda Diss, DO [facility]  07/17/2018  9:00 AM Deboraha Sprang, MD [facility]  08/28/2018 10:00 AM [name], DO [facility]   CMA served as scribe during this visit. History, Physical, and Plan performed by medical provider. The above documentation has been reviewed and is accurate and complete. [name], D.O.

## 2018-05-01 ENCOUNTER — Encounter: Payer: Self-pay | Admitting: Family Medicine

## 2018-05-01 ENCOUNTER — Ambulatory Visit (INDEPENDENT_AMBULATORY_CARE_PROVIDER_SITE_OTHER): Payer: PPO | Admitting: Family Medicine

## 2018-05-01 VITALS — BP 120/70 | HR 69 | Temp 98.1°F | Ht 64.0 in | Wt 175.0 lb

## 2018-05-01 DIAGNOSIS — E119 Type 2 diabetes mellitus without complications: Secondary | ICD-10-CM

## 2018-05-01 DIAGNOSIS — Z9189 Other specified personal risk factors, not elsewhere classified: Secondary | ICD-10-CM | POA: Diagnosis not present

## 2018-05-01 DIAGNOSIS — Z114 Encounter for screening for human immunodeficiency virus [HIV]: Secondary | ICD-10-CM | POA: Diagnosis not present

## 2018-05-01 DIAGNOSIS — E039 Hypothyroidism, unspecified: Secondary | ICD-10-CM

## 2018-05-01 DIAGNOSIS — E1169 Type 2 diabetes mellitus with other specified complication: Secondary | ICD-10-CM | POA: Diagnosis not present

## 2018-05-01 DIAGNOSIS — Z1159 Encounter for screening for other viral diseases: Secondary | ICD-10-CM | POA: Diagnosis not present

## 2018-05-01 DIAGNOSIS — M5431 Sciatica, right side: Secondary | ICD-10-CM | POA: Diagnosis not present

## 2018-05-01 DIAGNOSIS — I10 Essential (primary) hypertension: Secondary | ICD-10-CM

## 2018-05-01 DIAGNOSIS — E1159 Type 2 diabetes mellitus with other circulatory complications: Secondary | ICD-10-CM | POA: Diagnosis not present

## 2018-05-01 DIAGNOSIS — Z1211 Encounter for screening for malignant neoplasm of colon: Secondary | ICD-10-CM

## 2018-05-01 DIAGNOSIS — H6982 Other specified disorders of Eustachian tube, left ear: Secondary | ICD-10-CM

## 2018-05-01 DIAGNOSIS — E2839 Other primary ovarian failure: Secondary | ICD-10-CM | POA: Diagnosis not present

## 2018-05-01 DIAGNOSIS — H6992 Unspecified Eustachian tube disorder, left ear: Secondary | ICD-10-CM

## 2018-05-01 DIAGNOSIS — E785 Hyperlipidemia, unspecified: Secondary | ICD-10-CM | POA: Diagnosis not present

## 2018-05-01 DIAGNOSIS — I152 Hypertension secondary to endocrine disorders: Secondary | ICD-10-CM

## 2018-05-01 LAB — T4, FREE: Free T4: 1.09 ng/dL (ref 0.60–1.60)

## 2018-05-01 LAB — COMPREHENSIVE METABOLIC PANEL
ALT: 40 U/L — ABNORMAL HIGH (ref 0–35)
AST: 31 U/L (ref 0–37)
Albumin: 4.5 g/dL (ref 3.5–5.2)
Alkaline Phosphatase: 70 U/L (ref 39–117)
BUN: 13 mg/dL (ref 6–23)
CO2: 30 mEq/L (ref 19–32)
Calcium: 9.3 mg/dL (ref 8.4–10.5)
Chloride: 99 mEq/L (ref 96–112)
Creatinine, Ser: 0.71 mg/dL (ref 0.40–1.20)
GFR: 87.58 mL/min (ref >60.00)
Glucose, Bld: 89 mg/dL (ref 70–99)
Potassium: 3.2 mEq/L — ABNORMAL LOW (ref 3.5–5.1)
Sodium: 140 mEq/L (ref 135–145)
Total Bilirubin: 0.5 mg/dL (ref 0.2–1.2)
Total Protein: 7.3 g/dL (ref 6.0–8.3)

## 2018-05-01 LAB — LIPID PANEL
Cholesterol: 183 mg/dL (ref 0–200)
HDL: 63.4 mg/dL (ref >39.00)
NonHDL: 119.78
Total CHOL/HDL Ratio: 3
Triglycerides: 222 mg/dL — ABNORMAL HIGH (ref 0.0–149.0)
VLDL: 44.4 mg/dL — ABNORMAL HIGH (ref 0.0–40.0)

## 2018-05-01 LAB — CBC WITH DIFFERENTIAL/PLATELET
Basophils Absolute: 0 10*3/uL (ref 0.0–0.1)
Basophils Relative: 0.6 % (ref 0.0–3.0)
Eosinophils Absolute: 0.1 10*3/uL (ref 0.0–0.7)
Eosinophils Relative: 1.5 % (ref 0.0–5.0)
HCT: 40.1 % (ref 36.0–46.0)
Hemoglobin: 13.5 g/dL (ref 12.0–15.0)
Lymphocytes Relative: 35.6 % (ref 12.0–46.0)
Lymphs Abs: 2.3 10*3/uL (ref 0.7–4.0)
MCHC: 33.7 g/dL (ref 30.0–36.0)
MCV: 89.2 fl (ref 78.0–100.0)
Monocytes Absolute: 0.5 10*3/uL (ref 0.1–1.0)
Monocytes Relative: 7.3 % (ref 3.0–12.0)
Neutro Abs: 3.6 10*3/uL (ref 1.4–7.7)
Neutrophils Relative %: 55 % (ref 43.0–77.0)
Platelets: 215 10*3/uL (ref 150.0–400.0)
RBC: 4.49 Mil/uL (ref 3.87–5.11)
RDW: 13.7 % (ref 11.5–15.5)
WBC: 6.6 10*3/uL (ref 4.0–10.5)

## 2018-05-01 LAB — MICROALBUMIN / CREATININE URINE RATIO
Creatinine,U: 16.8 mg/dL
Microalb Creat Ratio: 4.2 mg/g (ref 0.0–30.0)
Microalb, Ur: <0.7 mg/dL (ref 0.0–1.9)

## 2018-05-01 LAB — LDL CHOLESTEROL, DIRECT: Direct LDL: 94 mg/dL

## 2018-05-01 LAB — HEMOGLOBIN A1C: Hgb A1c MFr Bld: 6.1 % (ref 4.6–6.5)

## 2018-05-01 LAB — TSH: TSH: 1.08 u[IU]/mL (ref 0.35–4.50)

## 2018-05-01 MED ORDER — TRAMADOL HCL 50 MG PO TABS: 50.0000 mg | ORAL_TABLET | Freq: Three times a day (TID) | ORAL | 0 refills | Status: DC | PRN

## 2018-05-01 MED ORDER — FLUTICASONE PROPIONATE 50 MCG/ACT NA SUSP: 2.0000 | Freq: Every day | NASAL | 6 refills | Status: DC

## 2018-05-01 NOTE — Telephone Encounter (Signed)
Copied from [place] [id number]. Topic: General - Other >> May 01, 2018 11:38 AM Lennox Solders wrote: Reason for CRM:pt received  a called from [facility]. Pt would like a specific GI md that dr Juleen China recommends

## 2018-05-01 NOTE — Patient Instructions (Signed)
Look up Gabapentin.  Tumeric goal is 1000 mg twice per day.

## 2018-05-01 NOTE — Telephone Encounter (Signed)
Spoke to pt told her Dr. Juleen China recommends either  Dr. Loletha Carrow or Dr. Hilarie Fredrickson with [facility]. Pt verbalized understanding.

## 2018-05-02 LAB — HIV ANTIBODY (ROUTINE TESTING W REFLEX): HIV 1&2 Ab, 4th Generation: NONREACTIVE

## 2018-05-02 LAB — HEPATITIS C ANTIBODY
Hepatitis C Ab: NONREACTIVE
SIGNAL TO CUT-OFF: 0.01 (ref <1.00)

## 2018-05-03 ENCOUNTER — Encounter: Payer: Self-pay | Admitting: Family Medicine

## 2018-05-07 ENCOUNTER — Other Ambulatory Visit: Payer: Self-pay

## 2018-05-07 MED ORDER — PRAVASTATIN SODIUM 20 MG PO TABS: 10.0000 mg | ORAL_TABLET | Freq: Every day | ORAL | 1 refills | Status: DC

## 2018-05-11 ENCOUNTER — Ambulatory Visit (INDEPENDENT_AMBULATORY_CARE_PROVIDER_SITE_OTHER): Payer: PPO | Admitting: Orthopedic Surgery

## 2018-05-14 ENCOUNTER — Ambulatory Visit (INDEPENDENT_AMBULATORY_CARE_PROVIDER_SITE_OTHER): Payer: PPO | Admitting: Orthopedic Surgery

## 2018-05-20 NOTE — Telephone Encounter (Signed)
LVM for return call to evaluate CP.

## 2018-05-21 NOTE — Telephone Encounter (Signed)
Pt returned my call this morning. She described her episode of CP as transient and followed a large meal with dessert. She stated she took an antacid and drank some water and her CP subsided. Pt denies feeling any PVC's and is currently taking her flecainide as prescribed. She denies any other episodes of CP since that time.    I have moved her new patient consult date up to 9/5 from October per patients request. Earlier appointments were offered, but patient will be out of town.   In the meantime, I have advised Ms Buckhalter if she has sustained or more frequent feelings of chest pain she should seek medical attention at an ED. She has agreed with this plan and has no additional questions or concerns.

## 2018-05-25 ENCOUNTER — Ambulatory Visit (INDEPENDENT_AMBULATORY_CARE_PROVIDER_SITE_OTHER): Payer: PPO | Admitting: Sports Medicine

## 2018-05-25 ENCOUNTER — Ambulatory Visit (INDEPENDENT_AMBULATORY_CARE_PROVIDER_SITE_OTHER): Payer: PPO

## 2018-05-25 ENCOUNTER — Encounter: Payer: Self-pay | Admitting: Sports Medicine

## 2018-05-25 ENCOUNTER — Telehealth: Payer: Self-pay | Admitting: Gastroenterology

## 2018-05-25 VITALS — BP 110/70 | HR 72 | Ht 64.0 in | Wt 177.8 lb

## 2018-05-25 DIAGNOSIS — M25551 Pain in right hip: Secondary | ICD-10-CM

## 2018-05-25 DIAGNOSIS — M25559 Pain in unspecified hip: Secondary | ICD-10-CM

## 2018-05-25 DIAGNOSIS — M75101 Unspecified rotator cuff tear or rupture of right shoulder, not specified as traumatic: Secondary | ICD-10-CM | POA: Diagnosis not present

## 2018-05-25 DIAGNOSIS — M5410 Radiculopathy, site unspecified: Secondary | ICD-10-CM

## 2018-05-25 DIAGNOSIS — K2960 Other gastritis without bleeding: Secondary | ICD-10-CM | POA: Diagnosis not present

## 2018-05-25 DIAGNOSIS — M25511 Pain in right shoulder: Secondary | ICD-10-CM | POA: Diagnosis not present

## 2018-05-25 DIAGNOSIS — M1611 Unilateral primary osteoarthritis, right hip: Secondary | ICD-10-CM

## 2018-05-25 MED ORDER — GABAPENTIN 100 MG PO CAPS: ORAL_CAPSULE | ORAL | 1 refills | Status: DC

## 2018-05-25 MED ORDER — PREDNISONE 50 MG PO TABS: 50.0000 mg | ORAL_TABLET | Freq: Every day | ORAL | 0 refills | Status: AC

## 2018-05-25 NOTE — Progress Notes (Signed)
Mary Hernandez. Mary Hernandez, [employer] [facility]  Mary Hernandez - 67 y.o. female MRN [id number]  Date of birth: [birth year]  Visit Date: 05/25/2018  PCP: [name], DO   Referred by: [name], DO  Scribe(s) for today's visit: Josepha Pigg, CMA  SUBJECTIVE:  Mary Hernandez "Mary Hernandez" is here for Initial Assessment (R hip pain) and New Patient (Initial Visit) (Right shoulder pain) .  Referred by: Dr. [name]  05/25/2018: Her R shoulder pain symptoms INITIALLY: Began about 1 month ago and she reports hx of frozen shoulder. She denies recent injury to the shoulder. She recently moved here from [place].  Described as moderate-severe constant aching, radiating to the R arm toward the elbow.  Worsened with picking anything up, rotation, reaching overhead Improved with rest Additional associated symptoms include: She denies clicking or popping in her shoulder. She denies neck pain but reports that tension builds up in her neck. She has tried massage and chiropractic therapy with some relief. She c/o mild swelling in the R elbow. She has received steroid injection in the past with minimal relief. She has received dry needling in the past with some relief.     At this time symptoms are worsening compared to onset.   No recent XR of neck or R shoulder.   Her R hip pain symptoms INITIALLY: Began several years ago and was dx with bursitis.  Described as moderate constant aching, radiating to the R thigh Worsened with activity, sitting, changing position, standing. Improved with rest, water therapy.  Additional associated symptoms include: She c/o increased stiffness in the morning. She c/o HA and pain in the posterior thigh. She denies n/t. She reports popping/clicking but isn't sure if it is her hip or her knee.     At this time symptoms are worsening compared to onset. She has been taking Tylenol nightly and stretching with  minimal relief. She has tried massage therapy with some relief. She has tried aquatic therapy with some relief. She has been sleeping with a pillow between her knees with minima relief. She has received steroid injection in the past with minimal relief. She has also received dry needling with minimal relief.   No recent XR of hip or lower back.   She has seen Dr. Sharol Given for L foot pain - was told in [place] that she has an extra bone in her foot. Dr. Sharol Given recommended new shoes and cut a hole in her inserts. She has been wearing these shoes for 2 months and reports good relief.   Flur/cyclo/lido 10/2/5% appl 3.2 g to affected area 5 x daily.  Diclofenac sodium 2% apply topically BID prn.  Biofreeze, heat, stretching, Tylenol She does not tolerate IBU/Naproxen - GI upset.    REVIEW OF SYSTEMS: Reports night time disturbances. Denies fevers, chills, or night sweats. Denies unexplained weight loss. Denies personal history of cancer. Denies changes in bowel or bladder habits. Denies recent unreported falls. Denies new or worsening dyspnea or wheezing. Reports headaches or dizziness.  Denies numbness, tingling or weakness  In the extremities.  Denies dizziness or presyncopal episodes Denies lower extremity edema    HISTORY & PERTINENT PRIOR DATA:  Prior History reviewed and updated per electronic medical record.  Significant/pertinent history, findings, studies include:  reports that she has never smoked. She has never used smokeless tobacco. Recent Labs    11/27/17 05/01/18 0955  HGBA1C 5.7 6.1   The 10-year ASCVD risk  score Mikey Bussing DC Jr., et al., 2013) is: 10.7%*   Values used to calculate the score:     Age: 27 years     Sex: Female     Is Non-Hispanic African American: No     Diabetic: Yes     Tobacco smoker: No     Systolic Blood Pressure: 417 mmHg     Is BP treated: Yes     HDL Cholesterol: 61 mg/dL*     Total Cholesterol: 186 mg/dL*     * - Cholesterol units were assumed for  this score calculation Problem  Radiculitis    OBJECTIVE:  VS:  HT:5\' 4"  (162.6 cm)   WT:177 lb 12.8 oz (80.6 kg)  BMI:30.5    BP:110/70  HR:72bpm  TEMP: ( )  RESP:96 %   PHYSICAL EXAM: CONSTITUTIONAL: Well-developed, Well-nourished and In no acute distress Alert & appropriately interactive. and Not depressed or anxious appearing. Respiratory: No increased work of breathing.  Trachea Midline EYES: Pupils are equal., EOM intact without nystagmus. and No scleral icterus.  Upper and Lower extremities: Warm and well perfused  Right HIP Exam: Well aligned, no significant deformity. No overlying skin changes. No focal bony tenderness   Log Roll: normal, no pain FADIR: normal, no pain FABER: normal, no pain Stinchfield testing: normal, no pain Strength: Normal   Right Shoulder Exam: Well aligned, no significant deformity. No overlying skin changes. Neck: normal range of motion and supple Non tender to palpation over: Bony Landmarks Axial loading produces: Mild pain and No crepitation Drop arm test: negative Internal Rotation:  ROM: Normal with no pain.   Strength: Normal External Rotation:  ROM: Normal with no pain.   Strength: Normal Hawkins: positive, mild pain Neers: positive, mild pain  Empty Can: positive, mild pain Strength: 4/5 Speed's: positive, mild pain Strength: 4/5 O'Briens: positive, mild pain Strength: 4/5   PROCEDURES & DATA REVIEWED:  Right shoulder x-rays reviewed today show slight degenerative changes but minimal.  Her right hip is well aligned with only minimal degenerative changes.  She does have some calcific changes of the hip left incidentally.  ASSESSMENT   1. Right hip pain   2. Right shoulder pain, unspecified chronicity   3. Nonerosive nonspecific gastritis   4. Arthritis of right hip   5. Greater trochanteric pain syndrome   6. Rotator cuff syndrome of right shoulder   7. Radiculitis     PLAN:  The pain is likely related to  tendinopathy.  Her hip abduction strength is well-maintained tenderness over the greater trochanter.  She has good internal and external rotation of the hips which is reassuring that osteoarthritis is less of an issue for her.  Given the generalized full body pain she is likely good candidate for aqua therapy to help resume her physical activity.   Right upper extremity is multifactorial likely subacromial bursitis versus rotator cuff tendinopathy with a likely component of radiculitis.  Would like to start her on a steroid Dosepak as well as gabapentin.  If any lack of improvement Further diagnostic evaluation of the cervical spine and/or diagnostic/therapeutic injection of either of the joints above.   No problem-specific Assessment & Plan notes found for this encounter.  Orders Placed This Encounter  Procedures  . DG HIP UNILAT W OR W/O PELVIS 2-3 VIEWS RIGHT  . DG Shoulder Right  . Ambulatory referral to Physical Therapy   Meds ordered this encounter  Medications  . gabapentin (NEURONTIN) 100 MG capsule    Sig: Start  with 1 tab po qhs X 1 week, then increase to 1 tab po bid X 1 week then 1 tab po tid prn    Dispense:  90 capsule    Refill:  1  . predniSONE (DELTASONE) 50 MG tablet    Sig: Take 1 tablet (50 mg total) by mouth daily with breakfast for 5 days.    Dispense:  5 tablet    Refill:  0     Follow-up: Return in about 4 weeks (around 06/22/2018).      Please see additional documentation for Objective, Assessment and Plan sections. Pertinent additional documentation may be included in corresponding procedure notes, imaging studies, problem based documentation and patient instructions. Please see these sections of the encounter for additional information regarding this visit.  CMA/ATC served as Education administrator during this visit. History, Physical, and Plan performed by medical provider. Documentation and orders reviewed and attested to.      Gerda Diss, [facility] Physician

## 2018-05-25 NOTE — Telephone Encounter (Signed)
ROI faxed to Dr. Almond Lint 8/19/19fbg

## 2018-05-28 ENCOUNTER — Telehealth: Payer: Self-pay | Admitting: Internal Medicine

## 2018-05-28 ENCOUNTER — Ambulatory Visit (INDEPENDENT_AMBULATORY_CARE_PROVIDER_SITE_OTHER)
Admission: RE | Admit: 2018-05-28 | Discharge: 2018-05-28 | Disposition: A | Discharge status: Home / Self Care | Payer: PPO | Source: Ambulatory Visit | Attending: Family Medicine | Admitting: Family Medicine

## 2018-05-28 DIAGNOSIS — E2839 Other primary ovarian failure: Secondary | ICD-10-CM

## 2018-05-28 MED ORDER — ACEBUTOLOL HCL 200 MG PO CAPS: 200.0000 mg | ORAL_CAPSULE | Freq: Every day | ORAL | 0 refills | Status: DC

## 2018-05-28 NOTE — Telephone Encounter (Signed)
New Message:   *STAT* If patient is at the pharmacy, call can be transferred to refill team.   1. Which medications need to be refilled? (please list name of each medication and dose if known) acebutolol (SECTRAL) 200 MG capsule  2. Which pharmacy/location (including street and city if local pharmacy) is medication to be sent to? Take 200 mg by mouth daily.  3. Do they need a 30 day or 90 day supply? 90 days

## 2018-05-28 NOTE — Addendum Note (Signed)
Addended by: Michae Kava on: 05/28/2018 02:58 PM   Modules accepted: Orders

## 2018-05-28 NOTE — Telephone Encounter (Signed)
Our office has never filled this for the patient as she is a new patient. Okay to refill? Please advise. Thanks, MI

## 2018-05-28 NOTE — Telephone Encounter (Signed)
[place], NP UNTIL PT SEE'S DR. KLEIN 06/11/18

## 2018-05-28 NOTE — Telephone Encounter (Signed)
It was prescribed by her previous cardiologist in [facility].

## 2018-05-29 ENCOUNTER — Other Ambulatory Visit: Payer: Self-pay | Admitting: *Deleted

## 2018-05-29 ENCOUNTER — Ambulatory Visit: Payer: PPO | Admitting: Sports Medicine

## 2018-05-29 ENCOUNTER — Ambulatory Visit: Payer: PPO | Admitting: Family Medicine

## 2018-05-29 MED ORDER — ACEBUTOLOL HCL 200 MG PO CAPS: 200.0000 mg | ORAL_CAPSULE | Freq: Every day | ORAL | 11 refills | Status: DC

## 2018-05-29 NOTE — Telephone Encounter (Signed)
She has established with Cardiology.  We are managing her Flecainide and Acebutolol.  - Please refill Acebutolol for 1 year. Richardson Dopp, PA-C    05/29/2018 5:56 AM

## 2018-05-29 NOTE — Telephone Encounter (Signed)
Refill for Acebutolol has been sent in for #30 x 11.

## 2018-06-10 ENCOUNTER — Encounter: Payer: Self-pay | Admitting: Internal Medicine

## 2018-06-11 ENCOUNTER — Ambulatory Visit: Payer: PPO | Admitting: Internal Medicine

## 2018-06-11 ENCOUNTER — Encounter: Payer: Self-pay | Admitting: Internal Medicine

## 2018-06-11 VITALS — BP 102/74 | HR 71 | Ht 64.0 in | Wt 175.0 lb

## 2018-06-11 DIAGNOSIS — M25551 Pain in right hip: Secondary | ICD-10-CM | POA: Diagnosis not present

## 2018-06-11 DIAGNOSIS — I493 Ventricular premature depolarization: Secondary | ICD-10-CM | POA: Diagnosis not present

## 2018-06-11 DIAGNOSIS — R079 Chest pain, unspecified: Secondary | ICD-10-CM | POA: Diagnosis not present

## 2018-06-11 DIAGNOSIS — M545 Low back pain: Secondary | ICD-10-CM | POA: Diagnosis not present

## 2018-06-11 DIAGNOSIS — M25521 Pain in right elbow: Secondary | ICD-10-CM | POA: Diagnosis not present

## 2018-06-11 DIAGNOSIS — M542 Cervicalgia: Secondary | ICD-10-CM | POA: Diagnosis not present

## 2018-06-11 DIAGNOSIS — M25511 Pain in right shoulder: Secondary | ICD-10-CM | POA: Diagnosis not present

## 2018-06-11 MED ORDER — METOPROLOL TARTRATE 50 MG PO TABS: 50.0000 mg | ORAL_TABLET | Freq: Once | ORAL | 0 refills | Status: DC

## 2018-06-11 NOTE — Progress Notes (Addendum)
ELECTROPHYSIOLOGY Office NOTE  Patient ID: Mary Hernandez, MRN: [id number], DOB/AGE: [birth year] 67 y.o. Admit date: (Not on file) Date of Consult: 06/11/2018  Primary Physician: [name], DO Primary Cardiologist: new  ( formerly [facility] Cardiology        HPI Mary Hernandez is a 67 y.o. female Seen to establish care in [place].  Previously seen at [facility] or symptomatic PACs and PVCs treated with flecainide.  Initally once daily then twice daily  This has been extremely successful  Has marked limitations in exercise tolerance 2/2 orthopedic issues.    About two weeks ago had prolonged episode of chest tightness without radiation but diaphoresis and SOB while standing; some nausea.  Resolved after 15 min  None previously   DATE TEST EF   2/16 LHC  60-65 % Coronary Art normal  6/17 Echo   75 %         DATE PR interval QRSduration Dose  4/14 170 90 0  2018/  224 112 100  9/19 218 130 100      Past Medical History:  Diagnosis Date  . Acquired hypothyroidism 03/01/2018   hx of goiter; s/p thyroidectomy  . Atrial premature depolarization   . BPPV (benign paroxysmal positional vertigo) 03/01/2018  . DM2 (diabetes mellitus, type 2) ([place]) 03/01/2018  . Essential hypertension 03/01/2018   denies hx - on diuretic for Meniere's and beta blocker for PVCs  . Frequent PVCs 03/01/2018   Controlled on Flecainide, Acebutolol  . History of cardiac catheterization    Nuc 7/16:  normal perfusion; EF 76 // LHC 8/16:  normal coronary arteries  . History of depression   . History of dizziness   . History of echocardiogram    Echo 03/2006:  Normal LVEF  . History of shortness of breath   . HLD (hyperlipidemia)   . Meniere's disease       Surgical History:  Past Surgical History:  Procedure Laterality Date  . BLADDER SURGERY  1997  . BREAST BIOPSY  2013  . BREAST REDUCTION SURGERY  1999  . CARDIAC CATHETERIZATION  05/2015  . Carpel Tunnel Release    .  CHOLECYSTECTOMY  2011  . [place]  . REDUCTION MAMMAPLASTY    . REFRACTIVE SURGERY    . S/P Eye Right 1967   Muscle Clipped   . THYROIDECTOMY  2012  . TONSILLECTOMY  1975  . TOTAL ABDOMINAL HYSTERECTOMY  1996     Home Meds: Prior to Admission medications   Medication Sig Start Date End Date Taking? Authorizing Provider  acebutolol (SECTRAL) 200 MG capsule Take 1 capsule (200 mg total) by mouth daily. 05/29/18  Yes Isaiah Serge, NP  benzonatate (TESSALON) 100 MG capsule Take by mouth 3 (three) times daily as needed for cough.   Yes [provider]  Calcium Citrate-Vitamin D 315-250 MG-UNIT TABS Take by mouth.   Yes [provider]  cetirizine (ZYRTEC) 10 MG tablet Take 10 mg by mouth daily.   Yes [provider]  Evening Primrose Oil 1000 MG CAPS Take 1,300 mg by mouth.   Yes [provider]  EVENING PRIMROSE OIL PO Take 1,500 mg by mouth 2 (two) times daily.   Yes [provider]  flecainide (TAMBOCOR) 100 MG tablet Take 1 tablet (100 mg total) by mouth 2 (two) times daily. 03/27/18  Yes [name], DO  fluticasone (FLONASE) 50 MCG/ACT nasal spray Place 2 sprays into both nostrils  daily. 05/01/18  Yes [name], DO  gabapentin (NEURONTIN) 100 MG capsule Start with 1 tab po qhs X 1 week, then increase to 1 tab po bid X 1 week then 1 tab po tid prn 05/25/18  Yes Gerda Diss, DO  Glucosamine-Chondroit-Vit C-Mn (GLUCOSAMINE 1500 COMPLEX) CAPS Take by mouth.   Yes [provider]  glycopyrrolate (ROBINUL) 1 MG tablet Take 1 mg by mouth 2 (two) times daily.   Yes [provider]  levothyroxine (SYNTHROID, LEVOTHROID) 125 MCG tablet Take 1 tablet (125 mcg total) by mouth daily before breakfast. 03/27/18  Yes [name], DO  omega-3 acid ethyl esters (LOVAZA) 1 g capsule Take 1 g by mouth 2 (two) times daily.   Yes [provider]  ondansetron (ZOFRAN) 8 MG tablet Take by mouth every 8 (eight)  hours as needed for nausea or vomiting.   Yes [provider]  potassium chloride (K-DUR,KLOR-CON) 10 MEQ tablet Take 1 tablet (10 mEq total) by mouth 2 (two) times daily. 03/27/18  Yes [name], DO  pravastatin (PRAVACHOL) 20 MG tablet Take 0.5 tablets (10 mg total) by mouth daily. 05/07/18  Yes [name], DO  Probiotic Product (PROBIOTIC-10 PO) Take by mouth.   Yes [provider]  Semaglutide (OZEMPIC) 1 MG/DOSE SOPN Inject 1 mg into the skin once a week. 03/23/18  Yes [name], DO  traMADol (ULTRAM) 50 MG tablet Take 1 tablet (50 mg total) by mouth every 8 (eight) hours as needed. 05/01/18  Yes [name], DO  triamterene-hydrochlorothiazide (DYAZIDE) 37.5-25 MG capsule Take 1 capsule by mouth daily.   Yes [provider]    Allergies:  Allergies  Allergen Reactions  . Adhesive [Tape] Rash    Social History   Socioeconomic History  . Marital status: Married    Spouse name: Not on file  . Number of children: 2  . Years of education: Not on file  . Highest education level: Not on file  Occupational History  . Occupation: Retired  Scientific laboratory technician  . Financial resource strain: Not on file  . Food insecurity:    Worry: Not on file    Inability: Not on file  . Transportation needs:    Medical: Not on file    Non-medical: Not on file  Tobacco Use  . Smoking status: Never Smoker  . Smokeless tobacco: Never Used  Substance and Sexual Activity  . Alcohol use: Yes    Comment: very limited   . Drug use: Never  . Sexual activity: Not on file  Lifestyle  . Physical activity:    Days per week: Not on file    Minutes per session: Not on file  . Stress: Not on file  Relationships  . Social connections:    Talks on phone: Not on file    Gets together: Not on file    Attends religious service: Not on file    Active member of club or organization: Not on file    Attends meetings of clubs or organizations: Not on file    Relationship status:  Not on file  . Intimate partner violence:    Fear of current or ex partner: Not on file    Emotionally abused: Not on file    Physically abused: Not on file    Forced sexual activity: Not on file  Other Topics Concern  . Not on file  Social History Narrative   Retired Licensed conveyancer   Born [place]  Grew up in [place] in [place] for 4 years   Moved to [place] in 2019   Twin daughters      Family History  Problem Relation Age of Onset  . Breast cancer Mother   . Heart attack Father   . Pulmonary fibrosis Father   . Peripheral Artery Disease Father        s/p CEA  . Early death Sister   . Depression Brother   . Hearing loss Brother   . Diabetes Daughter   . Heart disease Paternal Grandmother      ROS:  Please see the history of present illness.     All other systems reviewed and negative.    Physical Exam: Blood pressure 102/74, pulse 71, height 5\' 4"  (1.626 m), weight 175 lb (79.4 kg), SpO2 97 %. General: Well developed, well nourished female in no acute distress. Head: Normocephalic, atraumatic, sclera non-icteric, no xanthomas, nares are without discharge. EENT: normal  Lymph Nodes:  none Neck: Negative for carotid bruits. JVD not elevated. Back:without scoliosis kyphosis Lungs: Clear bilaterally to auscultation without wheezes, rales, or rhonchi. Breathing is unlabored. Heart: RRR with S1 S2. No  murmur . No rubs, or gallops appreciated. Abdomen: Soft, non-tender, non-distended with normoactive bowel sounds. No hepatomegaly. No rebound/guarding. No obvious abdominal masses. Msk:  Strength and tone appear normal for age. Extremities: No clubbing or cyanosis. No edema.  Distal pedal pulses are 2+ and equal bilaterally. Skin: Warm and Dry Neuro: Alert and oriented X 3. CN III-XII intact Grossly normal sensory and motor function . Psych:  Responds to questions appropriately with a normal affect.      Labs: Cardiac Enzymes No results for  input(s): CKTOTAL, CKMB, TROPONINI in the last 72 hours. CBC Lab Results  Component Value Date   WBC 6.6 05/01/2018   HGB 13.5 05/01/2018   HCT 40.1 05/01/2018   MCV 89.2 05/01/2018   PLT 215.0 05/01/2018   PROTIME: No results for input(s): LABPROT, INR in the last 72 hours. Chemistry No results for input(s): NA, K, CL, CO2, BUN, CREATININE, CALCIUM, PROT, BILITOT, ALKPHOS, ALT, AST, GLUCOSE in the last 168 hours.  Invalid input(s): LABALBU Lipids Lab Results  Component Value Date   CHOL 183 05/01/2018   HDL 63.40 05/01/2018   LDLCALC 92 11/27/2017   TRIG 222.0 (H) 05/01/2018   BNP No results found for: PROBNP Thyroid Function Tests: No results for input(s): TSH, T4TOTAL, T3FREE, THYROIDAB in the last 72 hours.  Invalid input(s): FREET3 Miscellaneous No results found for: DDIMER  Radiology/Studies:  Dg Shoulder Right  Result Date: 05/25/2018 CLINICAL DATA:  Chronic right shoulder pain with no known injury. EXAM: RIGHT SHOULDER - 2+ VIEW COMPARISON:  None. FINDINGS: The bones are subjectively adequately mineralized. The glenohumeral and AC joint spaces are well maintained. There is a small subacromial spur and a small spur from the inferior aspect of the lateral portion of the clavicle. The subacromial subdeltoid space is reasonably well-maintained. There is calcifications in the region of the insertion of the supraspinatus tendon on the greater tuberosity of the humerus. IMPRESSION: Findings compatible with calcific tendinosis of the rotator cuff. Small sub acromial and inferior distal clavicular spurs may be and packed ting the superior aspect of the rotator cuff as well. No high-grade joint space loss. Electronically Signed   By: David  Martinique M.D.   On: 05/25/2018 11:32   Dg Bone Density  Result Date: 05/31/2018 Date of study: 05/28/18 Exam: DUAL X-RAY ABSORPTIOMETRY (DXA)  FOR BONE MINERAL DENSITY (BMD) Instrument: [employer] [facility] Provider: PCP Indication:  screening for osteoporosis Comparison: none (please note that it is not possible to compare data from different instruments) Clinical data: Pt is a 67 y.o. female with previous fractures. Results:  Lumbar spine L1-L4 Femoral neck (FN) 33% distal radius T-score 0.4 RFN: -0.3 LFN: -0.2 n/a Change in BMD from previous DXA test (%) n/a n/a n/a (*) statistically significant Assessment: the BMD is normal according to the [facility] classification for osteoporosis (see below). Fracture risk: low FRAX score: not calculated due to normal BMD Comments: the technical quality of the study is good Recommend optimizing calcium (1200 mg/day) and vitamin D (800 IU/day) intake. No pharmacological treatment is indicated. Followup: Repeat BMD is appropriate after 2 years. WHO criteria for diagnosis of osteoporosis in postmenopausal women and in men 90 y/o or older: - normal: T-score -1.0 to + 1.0 - osteopenia/low bone density: T-score between -2.5 and -1.0 - osteoporosis: T-score below -2.5 - severe osteoporosis: T-score below -2.5 with history of fragility fracture Note: although not part of the WHO classification, the presence of a fragility fracture, regardless of the T-score, should be considered diagnostic of osteoporosis, provided other causes for the fracture have been excluded. Loura Pardon MD   Dg Hip Kaylyn Layer W Or W/o Pelvis 2-3 Views Right  Result Date: 05/25/2018 CLINICAL DATA:  Chronic right hip pain radiating down the right leg with no known injury. EXAM: DG HIP (WITH OR WITHOUT PELVIS) 2-3V RIGHT COMPARISON:  None in PACs FINDINGS: The bony pelvis is subjectively adequately mineralized. There is no lytic nor blastic lesion. AP and lateral views of the right hip reveal preservation of the joint space. The articular surfaces of the femoral head and acetabulum remains smoothly rounded. The femoral neck, intertrochanteric, and subtrochanteric regions are normal. IMPRESSION: There is no acute or significant chronic bony  abnormality of the right hip. Electronically Signed   By: David  Martinique M.D.   On: 05/25/2018 11:30    EKG: sinus 71 22/13/44   Assessment and Plan:  PVCs  Chest pain   She has had long-standing PVCs which have been obliterated with flecainide.  Her QRS/PR intervals are long; we have requested from her home cardiology office for reinitiation ECG to try to estimate QRS widening.  I reviewed this with her as we may need to reduce the dose.  I am also concerned about the episode of chest pain associate with diaphoresis and nausea as to whether it was an ischemic event.  She has not been able to exercise of late because of foot issues and back issues.  Once, we will undertake CTA to look for coronary artery disease.  In the event that is identified we will need to stop her flecainide.        Virl Axe

## 2018-06-11 NOTE — Patient Instructions (Addendum)
Medication Instructions:  Your physician recommends that you continue on your current medications as directed. Please refer to the Current Medication list given to you today.  Labwork: None ordered.  Testing/Procedures: Your physician has requested that you have cardiac CT. Cardiac computed tomography (CT) is a painless test that uses an x-ray machine to take clear, detailed pictures of your heart. For further information please visit [email or web address]. Please follow instruction sheet as given.    Follow-Up: Your physician recommends that you schedule a follow-up appointment in:   6 months with Dr Caryl Comes.  Any Other Special Instructions Will Be Listed Below (If Applicable).    Cardiac CT Instructions.   Please arrive at the [facility] main entrance of [facility] at ________________________ (30-45 minutes prior to test start time)  [facility] [address], [place] [id number] [phone]  Proceed to the [facility] Radiology Department (First Floor).  Please follow these instructions carefully (unless otherwise directed):   On the Night Before the Test: . Drink plenty of water. . Do not consume any caffeinated/decaffeinated beverages or chocolate 12 hours prior to your test. . Do not take any antihistamines 12 hours prior to your test.  On the Day of the Test: . Drink plenty of water. Do not drink any water within one hour of the test. . Do not eat any food 4 hours prior to the test. . You may take your regular medications prior to the test. . IF NOT ON A BETA BLOCKER - Take 50 mg of lopressor (metoprolol) one hour before the test.   After the Test: . Drink plenty of water. . After receiving IV contrast, you may experience a mild flushed feeling. This is normal. . On occasion, you may experience a mild rash up to 24 hours after the test. This is not dangerous. If this occurs, you can take Benadryl 25 mg and increase your fluid  intake. . If you experience trouble breathing, this can be serious. If it is severe call 911 IMMEDIATELY. If it is mild, please call our office.   If you need a refill on your cardiac medications before your next appointment, please call your pharmacy.

## 2018-06-15 NOTE — Progress Notes (Signed)
Mary Hernandez is a 67 y.o. female is here for follow up.  History of Present Illness:   Mary Hernandez, CMA acting as scribe for Dr. [name].   HPI: Patient in office for follow up.   Back Pain  This is a chronic (Right hip sciatica pain) problem. The problem occurs constantly. The problem has been gradually worsening (past couple of weeks) since onset. The quality of the pain is described as aching. The pain radiates to the right thigh. Pain scale: pain worsens with activity can go from a 3 to a 6. The pain is moderate. The pain is the same all the time (depends on activity). The symptoms are aggravated by sitting, position and standing. Stiffness is present in the morning. Associated symptoms include headaches and leg pain (back of right thigh). Pertinent negatives include no numbness or tingling. Treatments tried: exercises at the poop and stretching occassonally, Tylenol at night. The treatment provided mild relief. She has been seen by Dr. Paulla Fore and currently doing PT. She is having trouble with arm pain now. She is now having pain in arm but thinks it may be from overdoing it the last few days. She was informed to make sure that she lets the PT know. She was given tramadol at last visit that did not help at all. She is also on gabapentin that she has not had any help. She is not able to take NSAIDs. She was offered a prescription for pain but patient declined at this time. She will call if changes her mind. She is concerned with side effects. She was given information for lidocaine patches.   Cardiac CT: patient was seen by cardiology for chest pain symptoms. She had episode of chest pain and is followed up by cardiology. They have ordered that and she will follow up with them after to see if the medications need to be changed.   Skin Changes: she has three areas that she would like to have removed. Two in the face and one on the right thigh. She has been seen by dermatology before she  moved here. She admits that they are" bumpy and bothersome". The area on the right side of jaw has changed in shape after she has noticed.   Ear Fullness: has improved from last visit but she would like to have prescriptions called in that she has been on for her previous ENT.   Health Maintenance Due  Topic Date Due  . PNA vac Low Risk Adult (1 of 2 - PCV13) 06/22/2017   Depression screen PHQ 2/9 02/25/2018  Decreased Interest 0  Down, Depressed, Hopeless 0  PHQ - 2 Score 0   PMHx, SurgHx, SocialHx, FamHx, Medications, and Allergies were reviewed in the Visit Navigator and updated as appropriate.   Patient Active Problem List   Diagnosis Date Noted  . Radiculitis 05/25/2018  . Arthritis of right hip 03/30/2018  . Breast nodule 03/30/2018  . Diverticulosis 03/30/2018  . GERD (gastroesophageal reflux disease) 03/30/2018  . Nonerosive nonspecific gastritis 03/30/2018  . Type 2 diabetes mellitus without complication, without long-term current use of insulin ([place]) 03/01/2018  . Hypertension associated with diabetes ([place]) 03/01/2018  . Hyperlipidemia associated with type 2 diabetes mellitus ([place]) 03/01/2018  . Acquired hypothyroidism 03/01/2018  . Vertigo 03/01/2018  . Frequent PVCs 03/01/2018   Social History   Tobacco Use  . Smoking status: Never Smoker  . Smokeless tobacco: Never Used  Substance Use Topics  . Alcohol use: Yes  Comment: very limited   . Drug use: Never   Current Medications and Allergies:   .  acebutolol (SECTRAL) 200 MG capsule, Take 1 capsule (200 mg total) by mouth daily., Disp: 30 capsule, Rfl: 11 .  benzonatate (TESSALON) 100 MG capsule, Take by mouth 3 (three) times daily as needed for cough., Disp: , Rfl:  .  Calcium Citrate-Vitamin D 315-250 MG-UNIT TABS, Take by mouth., Disp: , Rfl:  .  cetirizine (ZYRTEC) 10 MG tablet, Take 10 mg by mouth daily., Disp: , Rfl:  .  Evening Primrose Oil 1000 MG CAPS, Take 1,300 mg by mouth., Disp: , Rfl:  .   EVENING PRIMROSE OIL PO, Take 1,500 mg by mouth 2 (two) times daily., Disp: , Rfl:  .  flecainide (TAMBOCOR) 100 MG tablet, Take 1 tablet (100 mg total) by mouth 2 (two) times daily., Disp: 60 tablet, Rfl: 3 .  fluticasone (FLONASE) 50 MCG/ACT nasal spray, Place 2 sprays into both nostrils daily., Disp: 16 g, Rfl: 6 .  gabapentin (NEURONTIN) 100 MG capsule, Start with 1 tab po qhs X 1 week, then increase to 1 tab po bid X 1 week then 1 tab po tid prn, Disp: 90 capsule, Rfl: 1 .  Glucosamine-Chondroit-Vit C-Mn (GLUCOSAMINE 1500 COMPLEX) CAPS, Take by mouth., Disp: , Rfl:  .  glycopyrrolate (ROBINUL) 1 MG tablet, Take 1 mg by mouth 2 (two) times daily., Disp: , Rfl:  .  levothyroxine (SYNTHROID, LEVOTHROID) 125 MCG tablet, Take 1 tablet (125 mcg total) by mouth daily before breakfast., Disp: 90 tablet, Rfl: 1 .  metoprolol tartrate (LOPRESSOR) 50 MG tablet, Take 1 tablet (50 mg total) by mouth once for 1 dose. Take one hour prior to your cardiac CT., Disp: 1 tablet, Rfl: 0 .  omega-3 acid ethyl esters (LOVAZA) 1 g capsule, Take 1 g by mouth 2 (two) times daily., Disp: , Rfl:  .  ondansetron (ZOFRAN) 8 MG tablet, Take by mouth every 8 (eight) hours as needed for nausea or vomiting., Disp: , Rfl:  .  potassium chloride (K-DUR,KLOR-CON) 10 MEQ tablet, Take 1 tablet (10 mEq total) by mouth 2 (two) times daily., Disp: 90 tablet, Rfl: 1 .  pravastatin (PRAVACHOL) 20 MG tablet, Take 0.5 tablets (10 mg total) by mouth daily., Disp: 30 tablet, Rfl: 1 .  Probiotic Product (PROBIOTIC-10 PO), Take by mouth., Disp: , Rfl:  .  Semaglutide (OZEMPIC) 1 MG/DOSE SOPN, Inject 1 mg into the skin once a week., Disp: 6 pen, Rfl: 1 .  traMADol (ULTRAM) 50 MG tablet, Take 1 tablet (50 mg total) by mouth every 8 (eight) hours as needed., Disp: 30 tablet, Rfl: 0 .  triamterene-hydrochlorothiazide (DYAZIDE) 37.5-25 MG capsule, Take 1 capsule by mouth daily., Disp: , Rfl:    Allergies  Allergen Reactions  . Adhesive [Tape]  Rash   Review of Systems   Pertinent items are noted in the HPI. Otherwise, ROS is negative.  Vitals:   Vitals:   06/16/18 0945  BP: 108/72  Pulse: 78  Temp: 98.5 F (36.9 C)  TempSrc: Oral  SpO2: 96%  Weight: 176 lb (79.8 kg)  Height: 5\' 4"  (1.626 m)     Body mass index is 30.21 kg/m.  Physical Exam:   Physical Exam  Constitutional: She appears well-nourished.  HENT:  Head: Normocephalic and atraumatic.  Eyes: Pupils are equal, round, and reactive to light. EOM are normal.  Neck: Normal range of motion. Neck supple.  Cardiovascular: Normal rate, regular rhythm, normal heart sounds  and intact distal pulses.  Pulmonary/Chest: Effort normal.  Abdominal: Soft.  Skin: Skin is warm.  Irritated SK bilateral temples, right inner thigh.  Psychiatric: She has a normal mood and affect. Her behavior is normal.  Nursing note and vitals reviewed.  Assessment and Plan:   Yaneth was seen today for follow-up.  Diagnoses and all orders for this visit:  Hypertension associated with diabetes ([place]) -     triamterene-hydrochlorothiazide (DYAZIDE) 37.5-25 MG capsule; Take 1 each (1 capsule total) by mouth daily. Must use brand name  Encounter for immunization -     Flu vaccine HIGH DOSE PF  Skin lesion of face Comments: Liquid nitrogen was applied using the liquid nitrogen gun without difficulty with an otoscope tip for concentration. Tolerated well without complications.  Common wart Comments:  Liquid nitrogen was applied using the liquid nitrogen gun without difficulty with an otoscope tip for concentration. Tolerated well without complications.  Vertigo -     glycopyrrolate (ROBINUL) 1 MG tablet; Take 1 tablet (1 mg total) by mouth 2 (two) times daily. -     ondansetron (ZOFRAN) 8 MG tablet; Take 1 tablet (8 mg total) by mouth every 8 (eight) hours as needed for nausea or vomiting.  Needs flu shot -     Flu vaccine HIGH DOSE PF  Right sided sciatica  . Reviewed  expectations re: course of current medical issues. . Discussed self-management of symptoms. . Outlined signs and symptoms indicating need for more acute intervention. . Patient verbalized understanding and all questions were answered. Marland Kitchen Health Maintenance issues including appropriate healthy diet, exercise, and smoking avoidance were discussed with patient. . See orders for this visit as documented in the electronic medical record. . Patient received an After Visit Summary.  CMA served as Education administrator during this visit. History, Physical, and Plan performed by medical provider. The above documentation has been reviewed and is accurate and complete. [name], D.O.  [name], DO [place], [facility] 06/22/2018

## 2018-06-16 ENCOUNTER — Encounter: Payer: Self-pay | Admitting: Family Medicine

## 2018-06-16 ENCOUNTER — Encounter: Payer: Self-pay | Admitting: Internal Medicine

## 2018-06-16 ENCOUNTER — Encounter: Payer: Self-pay | Admitting: Gastroenterology

## 2018-06-16 ENCOUNTER — Ambulatory Visit (INDEPENDENT_AMBULATORY_CARE_PROVIDER_SITE_OTHER): Payer: PPO | Admitting: Family Medicine

## 2018-06-16 ENCOUNTER — Telehealth: Payer: Self-pay | Admitting: Family Medicine

## 2018-06-16 ENCOUNTER — Other Ambulatory Visit: Payer: Self-pay

## 2018-06-16 ENCOUNTER — Telehealth: Payer: Self-pay | Admitting: Internal Medicine

## 2018-06-16 VITALS — BP 108/72 | HR 78 | Temp 98.5°F | Ht 64.0 in | Wt 176.0 lb

## 2018-06-16 DIAGNOSIS — M25551 Pain in right hip: Secondary | ICD-10-CM | POA: Diagnosis not present

## 2018-06-16 DIAGNOSIS — L989 Disorder of the skin and subcutaneous tissue, unspecified: Secondary | ICD-10-CM | POA: Diagnosis not present

## 2018-06-16 DIAGNOSIS — R42 Dizziness and giddiness: Secondary | ICD-10-CM | POA: Diagnosis not present

## 2018-06-16 DIAGNOSIS — I10 Essential (primary) hypertension: Secondary | ICD-10-CM | POA: Diagnosis not present

## 2018-06-16 DIAGNOSIS — Z23 Encounter for immunization: Secondary | ICD-10-CM

## 2018-06-16 DIAGNOSIS — M25511 Pain in right shoulder: Secondary | ICD-10-CM | POA: Diagnosis not present

## 2018-06-16 DIAGNOSIS — I152 Hypertension secondary to endocrine disorders: Secondary | ICD-10-CM

## 2018-06-16 DIAGNOSIS — B078 Other viral warts: Secondary | ICD-10-CM | POA: Diagnosis not present

## 2018-06-16 DIAGNOSIS — E1159 Type 2 diabetes mellitus with other circulatory complications: Secondary | ICD-10-CM | POA: Diagnosis not present

## 2018-06-16 DIAGNOSIS — M25521 Pain in right elbow: Secondary | ICD-10-CM | POA: Diagnosis not present

## 2018-06-16 DIAGNOSIS — M5431 Sciatica, right side: Secondary | ICD-10-CM | POA: Diagnosis not present

## 2018-06-16 DIAGNOSIS — M545 Low back pain: Secondary | ICD-10-CM | POA: Diagnosis not present

## 2018-06-16 DIAGNOSIS — M542 Cervicalgia: Secondary | ICD-10-CM | POA: Diagnosis not present

## 2018-06-16 MED ORDER — ONDANSETRON HCL 8 MG PO TABS: 8.0000 mg | ORAL_TABLET | Freq: Three times a day (TID) | ORAL | 1 refills | Status: DC | PRN

## 2018-06-16 MED ORDER — GLYCOPYRROLATE 1 MG PO TABS: 1.0000 mg | ORAL_TABLET | Freq: Two times a day (BID) | ORAL | 1 refills | Status: DC

## 2018-06-16 MED ORDER — FLECAINIDE ACETATE 50 MG PO TABS: 75.0000 mg | ORAL_TABLET | Freq: Two times a day (BID) | ORAL | 3 refills | Status: DC

## 2018-06-16 MED ORDER — TRIAMTERENE-HCTZ 37.5-25 MG PO CAPS: 1.0000 | ORAL_CAPSULE | Freq: Every day | ORAL | 1 refills | Status: DC

## 2018-06-16 MED ORDER — ONDANSETRON 8 MG PO TBDP: 8.0000 mg | ORAL_TABLET | Freq: Three times a day (TID) | ORAL | 0 refills | Status: DC | PRN

## 2018-06-16 NOTE — Progress Notes (Signed)
REVIEWED ECG from [facility] cardiology 2014 with measurements addended to my last note  We will decrease dose to 75 and reassess as the prolongation of PR/GRS is 35-40%

## 2018-06-16 NOTE — Telephone Encounter (Addendum)
Copied from [place] [id number]. Topic: General - Other >> Jun 16, 2018 12:05 PM Lennox Solders wrote: Reason for VFM:[facility] pharm is calling the pt would like ondansetron 8 mg orally disintegrate tablets instead of regular ondansetron 8 mg. [employer] [address]

## 2018-06-16 NOTE — Telephone Encounter (Signed)
New message  Patient is calling to see if her medical records were received by Dr. Caryl Comes. Would like a call or send to mychart to discuss medication adjustments.

## 2018-06-16 NOTE — Telephone Encounter (Signed)
Called in new script called patient to let her know but no answer and no v/m

## 2018-06-16 NOTE — Patient Instructions (Signed)
4% lidocaine patches over the counter

## 2018-06-18 DIAGNOSIS — M25521 Pain in right elbow: Secondary | ICD-10-CM | POA: Diagnosis not present

## 2018-06-18 DIAGNOSIS — M25511 Pain in right shoulder: Secondary | ICD-10-CM | POA: Diagnosis not present

## 2018-06-18 DIAGNOSIS — M545 Low back pain: Secondary | ICD-10-CM | POA: Diagnosis not present

## 2018-06-18 DIAGNOSIS — M25551 Pain in right hip: Secondary | ICD-10-CM | POA: Diagnosis not present

## 2018-06-18 DIAGNOSIS — M542 Cervicalgia: Secondary | ICD-10-CM | POA: Diagnosis not present

## 2018-06-19 ENCOUNTER — Other Ambulatory Visit: Payer: Self-pay

## 2018-06-19 ENCOUNTER — Encounter: Payer: Self-pay | Admitting: Family Medicine

## 2018-06-19 MED ORDER — POTASSIUM CHLORIDE CRYS ER 10 MEQ PO TBCR: 10.0000 meq | EXTENDED_RELEASE_TABLET | Freq: Two times a day (BID) | ORAL | 1 refills | Status: DC

## 2018-06-22 ENCOUNTER — Encounter: Payer: Self-pay | Admitting: Family Medicine

## 2018-06-22 ENCOUNTER — Telehealth: Payer: Self-pay | Admitting: Gastroenterology

## 2018-06-22 ENCOUNTER — Other Ambulatory Visit: Payer: Self-pay

## 2018-06-22 DIAGNOSIS — M545 Low back pain: Secondary | ICD-10-CM | POA: Diagnosis not present

## 2018-06-22 DIAGNOSIS — M542 Cervicalgia: Secondary | ICD-10-CM | POA: Diagnosis not present

## 2018-06-22 DIAGNOSIS — M25551 Pain in right hip: Secondary | ICD-10-CM | POA: Diagnosis not present

## 2018-06-22 DIAGNOSIS — M25511 Pain in right shoulder: Secondary | ICD-10-CM | POA: Diagnosis not present

## 2018-06-22 DIAGNOSIS — M25521 Pain in right elbow: Secondary | ICD-10-CM | POA: Diagnosis not present

## 2018-06-22 MED ORDER — POTASSIUM CHLORIDE CRYS ER 10 MEQ PO TBCR: 10.0000 meq | EXTENDED_RELEASE_TABLET | Freq: Two times a day (BID) | ORAL | 1 refills | Status: DC

## 2018-06-22 NOTE — Telephone Encounter (Signed)
Please see portal message from patient re: her outside records.  I am not sure how to attend to that, or whether we could really get records without a signed release form from patient.  It sounds like she has already requested them.  Can you find out why she needs a colonoscopy to save her an office visit if possible?

## 2018-06-23 MED ORDER — ACEBUTOLOL HCL 200 MG PO CAPS: 200.0000 mg | ORAL_CAPSULE | Freq: Every day | ORAL | 3 refills | Status: DC

## 2018-06-23 NOTE — Addendum Note (Signed)
Addended by: Dollene Primrose on: 06/23/2018 09:04 AM   Modules accepted: Orders

## 2018-06-24 ENCOUNTER — Ambulatory Visit: Payer: PPO | Admitting: Family Medicine

## 2018-06-24 DIAGNOSIS — M545 Low back pain: Secondary | ICD-10-CM | POA: Diagnosis not present

## 2018-06-24 DIAGNOSIS — M25551 Pain in right hip: Secondary | ICD-10-CM | POA: Diagnosis not present

## 2018-06-24 DIAGNOSIS — M25521 Pain in right elbow: Secondary | ICD-10-CM | POA: Diagnosis not present

## 2018-06-24 DIAGNOSIS — M542 Cervicalgia: Secondary | ICD-10-CM | POA: Diagnosis not present

## 2018-06-24 DIAGNOSIS — M25511 Pain in right shoulder: Secondary | ICD-10-CM | POA: Diagnosis not present

## 2018-06-25 ENCOUNTER — Ambulatory Visit (INDEPENDENT_AMBULATORY_CARE_PROVIDER_SITE_OTHER): Payer: PPO | Admitting: Sports Medicine

## 2018-06-25 ENCOUNTER — Encounter: Payer: Self-pay | Admitting: Sports Medicine

## 2018-06-25 VITALS — BP 102/70 | HR 80 | Ht 64.0 in | Wt 176.6 lb

## 2018-06-25 DIAGNOSIS — M75101 Unspecified rotator cuff tear or rupture of right shoulder, not specified as traumatic: Secondary | ICD-10-CM | POA: Diagnosis not present

## 2018-06-25 DIAGNOSIS — M25559 Pain in unspecified hip: Secondary | ICD-10-CM | POA: Diagnosis not present

## 2018-06-25 DIAGNOSIS — M5431 Sciatica, right side: Secondary | ICD-10-CM | POA: Diagnosis not present

## 2018-06-25 DIAGNOSIS — M25551 Pain in right hip: Secondary | ICD-10-CM | POA: Diagnosis not present

## 2018-06-25 DIAGNOSIS — Z8739 Personal history of other diseases of the musculoskeletal system and connective tissue: Secondary | ICD-10-CM | POA: Insufficient documentation

## 2018-06-25 DIAGNOSIS — M25511 Pain in right shoulder: Secondary | ICD-10-CM | POA: Diagnosis not present

## 2018-06-25 DIAGNOSIS — M7711 Lateral epicondylitis, right elbow: Secondary | ICD-10-CM | POA: Insufficient documentation

## 2018-06-25 MED ORDER — CELECOXIB 100 MG PO CAPS: 100.0000 mg | ORAL_CAPSULE | Freq: Two times a day (BID) | ORAL | 2 refills | Status: DC | PRN

## 2018-06-25 NOTE — Progress Notes (Signed)
Mary Hernandez. Mary Hernandez, [facility] [place]  Mary Hernandez - 67 y.o. female MRN [id number]  Date of birth: April 05, 1951  Visit Date: 06/25/2018  PCP: [name], DO   Referred by: [name], DO  Scribe(s) for today's visit: Josepha Pigg, CMA  SUBJECTIVE:  Mary Hernandez "Mary Hernandez" is here for No chief complaint on file. Marland Kitchen  Referred by: Dr. [name]  HPI   05/25/2018: Her R shoulder pain symptoms INITIALLY: Began about 1 month ago and she reports hx of frozen shoulder. She denies recent injury to the shoulder. She recently moved here from [place].  Described as moderate-severe constant aching, radiating to the R arm toward the elbow.  Worsened with picking anything up, rotation, reaching overhead Improved with rest Additional associated symptoms include: She denies clicking or popping in her shoulder. She denies neck pain but reports that tension builds up in her neck. She has tried massage and chiropractic therapy with some relief. She c/o mild swelling in the R elbow. She has received steroid injection in the past with minimal relief. She has received dry needling in the past with some relief.    At this time symptoms are worsening compared to onset.  No recent XR of neck or R shoulder.   Her R hip pain symptoms INITIALLY: Began several years ago and was dx with bursitis.  Described as moderate constant aching, radiating to the R thigh Worsened with activity, sitting, changing position, standing. Improved with rest, water therapy.  Additional associated symptoms include: She c/o increased stiffness in the morning. She c/o HA and pain in the posterior thigh. She denies n/t. She reports popping/clicking but isn't sure if it is her hip or her knee.    At this time symptoms are worsening compared to onset. She has been taking Tylenol nightly and stretching with minimal relief. She has tried massage therapy with  some relief. She has tried aquatic therapy with some relief. She has been sleeping with a pillow between her knees with minima relief. She has received steroid injection in the past with minimal relief. She has also received dry needling with minimal relief.  No recent XR of hip or lower back.   She has seen Dr. Sharol Given for L foot pain - was told in [place] that she has an extra bone in her foot. Dr. Sharol Given recommended new shoes and cut a hole in her inserts. She has been wearing these shoes for 2 months and reports good relief.   Flur/cyclo/lido 10/2/5% appl 3.2 g to affected area 5 x daily.  Diclofenac sodium 2% apply topically BID prn.  Biofreeze, heat, stretching, Tylenol She does not tolerate IBU/Naproxen - GI upset.   06/25/2018: R shoulder and elbow: Compared to the last office visit, her previously described symptoms are worsening. She did PT yesterday and her sx have been exacerbated.  Current symptoms are moderate-severe & are radiating to the elbow. She is having trouble with ADLs d/t pain.  She has been seeing Emily at [facility], they have done stem, heat, and massage.  She started Gabapentin 100 mg but didn't notice any diffence she she d/t a few days ago. She has been taking Tylenol with minimal relief. She does not tolerate IBU or Naproxen d/t GI upset R hip: Compared to the last office visit, her previously described symptoms are improving. She denies n/t or weakness in the hip but does note that she has to take  her time standing up d/t stiffness.  Current symptoms are mild-moderate & are radiating to the lower back, gluteal region and into the leg if sitting for a long period of time.  She has been going to PT and yesterday they did aqua therapy, she responded well to this. She is doing HEP 2 x daily with no trouble.   REVIEW OF SYSTEMS: Reports night time disturbances. Sleeps better with Melatonin, Tylenol PM, SleepyTime Tea.  Denies fevers, chills, or night sweats. Denies  unexplained weight loss. Denies personal history of cancer. Denies changes in bowel or bladder habits. Denies recent unreported falls. Denies new or worsening dyspnea or wheezing. Denies headaches or dizziness.  Denies numbness, tingling or weakness  In the extremities.  Denies dizziness or presyncopal episodes Denies lower extremity edema    HISTORY:  Prior history reviewed and updated per electronic medical record.  Social History   Occupational History  . Occupation: Retired  Tobacco Use  . Smoking status: Never Smoker  . Smokeless tobacco: Never Used  Substance and Sexual Activity  . Alcohol use: Yes    Comment: very limited   . Drug use: Never  . Sexual activity: Not on file   Social History   Social History Narrative   Retired Licensed conveyancer   Born [place] in [place] in [place] for 30 years   Moved to [place] in 2019   Twin daughters     [place]:   Recent Labs    11/27/17 05/01/18 0955  HGBA1C 5.7 6.1   . X-ray right shoulder 05/25/2018: Mild degenerative changes with calcific tendinosis of the rotator cuff. . X-rays right hip 05/25/2018 normal-appearing .    OBJECTIVE:  VS:  HT:5\' 4"  (162.6 cm)   WT:176 lb 9.6 oz (80.1 kg)  BMI:30.3    BP:102/70  HR:80bpm  TEMP: ( )  RESP:96 %   PHYSICAL EXAM: CONSTITUTIONAL: Well-developed, Well-nourished and In no acute distress PSYCHIATRIC: Alert & appropriately interactive. and Not depressed or anxious appearing. RESPIRATORY: No increased work of breathing and Trachea Midline EYES: Pupils are equal., EOM intact without nystagmus. and No scleral icterus.  VASCULAR EXAM: Warm and well perfused NEURO: unremarkable Normal associated myotomal distribution strength to manual muscle testing Normal sensation to light touch Normal and symmetric associated DTRs    MSK Exam: Right arm  Well aligned, no significant deformity. No overlying skin changes. TTP over  Lateral epicondyle of the elbow.  Common extensor musculature. Non tender over Shoulder anteriorly   RANGE OF MOTION & STRENGTH  Slightly limited overhead range of motion of the shoulder.  Full flexion extension of the elbow.  She has normal intrinsic rotator cuff strength as well as normal wrist extension and elbow flexion and extension although painful.   SPECIALITY TESTING:  Right shoulder positive Hawkins and Neer's but this is minimal.  Intrinsic rotator cuff strength is normal.  No significant pain with axial load and circumduction.  Right elbow is tender over the lateral condyle as well as with wrist extension and has a positive text book test.  No significant pain with third finger extension resistance.  Mild pain with brachial plexus squeeze but this is tightness and does not cause any radiation.  Negative Spurling's compression test and Lhermitte's compression test with overall good range of motion of her neck. Good internal and external rotation of the hip.  Gait is improved and stable anterior chain dominance.    ASSESSMENT  1. Right sided sciatica   2. Right hip pain   3. Right shoulder pain, unspecified chronicity   4. Lateral epicondylitis of right elbow   5. Greater trochanteric pain syndrome   6. Rotator cuff syndrome of right shoulder     PLAN:  Pertinent additional documentation may be included in corresponding procedure notes, imaging studies, problem based documentation and patient instructions.  Procedures:  . None  Medications:  Meds ordered this encounter  Medications  . celecoxib (CELEBREX) 100 MG capsule    Sig: Take 1 capsule (100 mg total) by mouth 2 (two) times daily as needed.    Dispense:  60 capsule    Refill:  2   Discussion/Instructions: No problem-specific Assessment & Plan notes found for this encounter.  . Overall her hip and back symptoms have improved especially since starting aqua therapy which she is only been able to do once.  I  recommend she continue doing this to improve her overall range of motion. . Gabapentin was reportedly not helpful so we will discontinue this at this time.  She did not pick up the steroid and we will actually agree on Celebrex today in place at this since the GI symptoms that she reported with NSAIDs previously mainly been stomach upset that should be mitigated with Celebrex. . Discussed red flag symptoms that warrant earlier emergent evaluation and patient voices understanding. . Activity modifications and the importance of avoiding exacerbating activities (limiting pain to no more than a 4 / 10 during or following activity) recommended and discussed. . >50% of this 25 minutes minute visit spent in direct patient counseling and/or coordination of care. Discussion was focused on education regarding the in discussing the pathoetiology and anticipated clinical course of the above condition.  Follow-up:  . Return in about 4 weeks (around 07/23/2018) for consideration of repeat injections.   . If any lack of improvement consider: Further diagnostic evaluation of the elbow and shoulder with MSK ultrasound.  Can consider nitroglycerin protocol for the elbow if persistent pain but would like to try an anti-inflammatory  .      CMA/ATC served as Education administrator during this visit. History, Physical, and Plan performed by medical provider. Documentation and orders reviewed and attested to.      Gerda Diss, [facility] Physician

## 2018-06-30 DIAGNOSIS — M25551 Pain in right hip: Secondary | ICD-10-CM | POA: Diagnosis not present

## 2018-06-30 DIAGNOSIS — M25511 Pain in right shoulder: Secondary | ICD-10-CM | POA: Diagnosis not present

## 2018-06-30 DIAGNOSIS — M25521 Pain in right elbow: Secondary | ICD-10-CM | POA: Diagnosis not present

## 2018-06-30 DIAGNOSIS — M545 Low back pain: Secondary | ICD-10-CM | POA: Diagnosis not present

## 2018-06-30 DIAGNOSIS — M542 Cervicalgia: Secondary | ICD-10-CM | POA: Diagnosis not present

## 2018-07-01 ENCOUNTER — Telehealth: Payer: Self-pay | Admitting: Gastroenterology

## 2018-07-01 NOTE — Telephone Encounter (Signed)
I have not seen any records.  Whether or not her insurance company considers this a diagnostic or screening procedure is not the question at present.  I am merely trying to determine, if I can, whether or not she is even due for a colonoscopy based on current guidelines.  I can only do that with report of at least the most recent colonoscopy (or prior as well, if available).  I am going to kindly ask her to communicate directly with you about this going forward.  - HD

## 2018-07-01 NOTE — Telephone Encounter (Signed)
Spoke with [facility] and they told her that if she ever had polyps then all future colonoscopies should be coded as diagnostic. She stated that she has had polyps before.

## 2018-07-01 NOTE — Telephone Encounter (Signed)
Dr. Loletha Carrow needs to see the records first before anything can be scheduled. Thanks.

## 2018-07-02 ENCOUNTER — Other Ambulatory Visit: Payer: PPO | Admitting: *Deleted

## 2018-07-02 ENCOUNTER — Other Ambulatory Visit: Payer: Self-pay

## 2018-07-02 DIAGNOSIS — E119 Type 2 diabetes mellitus without complications: Secondary | ICD-10-CM

## 2018-07-02 DIAGNOSIS — E1159 Type 2 diabetes mellitus with other circulatory complications: Secondary | ICD-10-CM

## 2018-07-02 DIAGNOSIS — I10 Essential (primary) hypertension: Secondary | ICD-10-CM | POA: Diagnosis not present

## 2018-07-02 DIAGNOSIS — I152 Hypertension secondary to endocrine disorders: Secondary | ICD-10-CM

## 2018-07-02 DIAGNOSIS — I493 Ventricular premature depolarization: Secondary | ICD-10-CM | POA: Diagnosis not present

## 2018-07-02 LAB — BASIC METABOLIC PANEL
BUN/Creatinine Ratio: 19 (ref 12–28)
BUN: 12 mg/dL (ref 8–27)
CO2: 25 mmol/L (ref 20–29)
Calcium: 9 mg/dL (ref 8.7–10.3)
Chloride: 100 mmol/L (ref 96–106)
Creatinine, Ser: 0.63 mg/dL (ref 0.57–1.00)
GFR calc Af Amer: 108 mL/min/{1.73_m2} (ref >59)
GFR, EST NON AFRICAN AMERICAN: 94 mL/min/{1.73_m2} (ref >59)
Glucose: 86 mg/dL (ref 65–99)
POTASSIUM: 3.8 mmol/L (ref 3.5–5.2)
SODIUM: 142 mmol/L (ref 134–144)

## 2018-07-03 ENCOUNTER — Telehealth: Payer: Self-pay

## 2018-07-03 NOTE — Telephone Encounter (Signed)
Called her prior GI doctor's office and left message for supervisor to please send records. Gave her fax# [phone], attn: Almyra Free.

## 2018-07-06 ENCOUNTER — Telehealth: Payer: Self-pay | Admitting: Gastroenterology

## 2018-07-06 NOTE — Telephone Encounter (Signed)
Recv'd medical records from [facility]/Dr. Frankey Poot and forwarded 10 pages to Dr. Wilfrid Lund

## 2018-07-08 ENCOUNTER — Telehealth: Payer: Self-pay | Admitting: Gastroenterology

## 2018-07-08 ENCOUNTER — Encounter (HOSPITAL_COMMUNITY): Payer: Self-pay

## 2018-07-08 ENCOUNTER — Ambulatory Visit (HOSPITAL_COMMUNITY)
Admission: RE | Admit: 2018-07-08 | Discharge: 2018-07-08 | Disposition: A | Discharge status: Home / Self Care | Payer: PPO | Source: Ambulatory Visit | Attending: Internal Medicine | Admitting: Internal Medicine

## 2018-07-08 ENCOUNTER — Other Ambulatory Visit: Payer: Self-pay

## 2018-07-08 DIAGNOSIS — R079 Chest pain, unspecified: Secondary | ICD-10-CM | POA: Diagnosis not present

## 2018-07-08 DIAGNOSIS — Z1211 Encounter for screening for malignant neoplasm of colon: Secondary | ICD-10-CM

## 2018-07-08 DIAGNOSIS — I493 Ventricular premature depolarization: Secondary | ICD-10-CM | POA: Insufficient documentation

## 2018-07-08 MED ORDER — NITROGLYCERIN 0.4 MG SL SUBL
SUBLINGUAL_TABLET | SUBLINGUAL | Status: AC
  Filled 2018-07-08: qty 2

## 2018-07-08 MED ORDER — METOPROLOL TARTRATE 5 MG/5ML IV SOLN
INTRAVENOUS | Status: AC
  Filled 2018-07-08: qty 5

## 2018-07-08 MED ORDER — METOPROLOL TARTRATE 5 MG/5ML IV SOLN
5.0000 mg | INTRAVENOUS | Status: DC | PRN
  Administered 2018-07-08 (×2): 5 mg via INTRAVENOUS
  Filled 2018-07-08: qty 5

## 2018-07-08 MED ORDER — IOPAMIDOL (ISOVUE-370) INJECTION 76%
100.0000 mL | Freq: Once | INTRAVENOUS | Status: AC | PRN
  Administered 2018-07-08: 80 mL via INTRAVENOUS

## 2018-07-08 MED ORDER — NITROGLYCERIN 0.4 MG SL SUBL
0.8000 mg | SUBLINGUAL_TABLET | Freq: Once | SUBLINGUAL | Status: AC
  Administered 2018-07-08: 0.8 mg via SUBLINGUAL
  Filled 2018-07-08: qty 25

## 2018-07-08 NOTE — Progress Notes (Signed)
Tolerated CT without incident. Drank coffee and ate peanut butter crackers after CT. Ambulatory to exit steady gait.

## 2018-07-08 NOTE — Telephone Encounter (Signed)
Patient wanted to meet Dr. Loletha Carrow, she is not having any issues but wanted to meet him prior to the procedure. Patient is scheduled for colonoscopy at [facility].

## 2018-07-08 NOTE — Telephone Encounter (Signed)
Records reviewed from [facility] [place]  (For documentation: Patient was having epigastric pain spring 2019.  EGD 01/13/2017 showed normal esophagus, nonerosive gastritis, biopsies gastric antrum and body negative for H. pylori.  Normal duodenum.  Pain was felt to be functional and patient was on Bentyl with improvement.  Had previously been on Elavil for diarrhea predominant IBS.  Colonoscopy November 2016 complete to the terminal ileum, preparation good.  Cecal polyp described as 5 mm on colonoscopy report, however measured a 1.2 cm in pathology.  Tubular adenoma.  Transverse colon polyp described as 11 mm, pathology measures at 8 mm, reportedly sessile serrated polyp without dysplasia.  An additional diminutive polyp in transverse colon was fulgurated with snare tip, so no pathology available.  Grade 2 internal hemorrhoids reported as well Colonoscopy 08/16/2013 for chronic diarrhea reports normal terminal ileum, normal colon without biopsies.  EGD November 2014 dysphagia and chronic diarrhea shows grade 1 esophagitis, small bowel biopsies with mild nonspecific lymphocytosis, normal villous architecture. Screening colonoscopy June 2012 terminal ileum, internal hemorrhoids, otherwise normal.  EGD January 2010 for GERD, normal study.  Colonoscopy 01/04/2008 for screening, removed apparent polyp which on pathology was lymphoid aggregate with hyperplastic changes.)  This patient is due for a surveillance colonoscopy for history of adenomatous colon polyps.  If she is not having any particular problems she would like addressed at an office visit, please arrange a direct book colonoscopy with [facility] nurse so patient can be put on my schedule.

## 2018-07-09 NOTE — Progress Notes (Signed)
Subjective:    Mary Hernandez is a 66 y.o. female and is here for a comprehensive physical exam.  Health Maintenance Due  Topic Date Due  . PNA vac Low Risk Adult (1 of 2 - PCV13) 06/10/2018   Current Outpatient Medications:  .  acebutolol (SECTRAL) 200 MG capsule, Take 1 capsule (200 mg total) by mouth daily., Disp: 90 capsule, Rfl: 3 .  Calcium Citrate-Vitamin D 315-250 MG-UNIT TABS, Take by mouth., Disp: , Rfl:  .  celecoxib (CELEBREX) 100 MG capsule, Take 1 capsule (100 mg total) by mouth 2 (two) times daily as needed., Disp: 60 capsule, Rfl: 2 .  cetirizine (ZYRTEC) 10 MG tablet, Take 10 mg by mouth daily., Disp: , Rfl:  .  EVENING PRIMROSE OIL PO, Take 1,500 mg by mouth 2 (two) times daily., Disp: , Rfl:  .  flecainide (TAMBOCOR) 50 MG tablet, Take 1.5 tablets (75 mg total) by mouth 2 (two) times daily., Disp: 270 tablet, Rfl: 3 .  fluticasone (FLONASE) 50 MCG/ACT nasal spray, Place 2 sprays into both nostrils daily., Disp: 16 g, Rfl: 6 .  Glucosamine-Chondroit-Vit C-Mn (GLUCOSAMINE 1500 COMPLEX) CAPS, Take by mouth., Disp: , Rfl:  .  glycopyrrolate (ROBINUL) 1 MG tablet, Take 1 tablet (1 mg total) by mouth 2 (two) times daily., Disp: 180 tablet, Rfl: 1 .  levothyroxine (SYNTHROID, LEVOTHROID) 125 MCG tablet, Take 1 tablet (125 mcg total) by mouth daily before breakfast., Disp: 90 tablet, Rfl: 1 .  metoprolol tartrate (LOPRESSOR) 50 MG tablet, Take 1 tablet (50 mg total) by mouth once for 1 dose. Take one hour prior to your cardiac CT., Disp: 1 tablet, Rfl: 0 .  omega-3 acid ethyl esters (LOVAZA) 1 g capsule, Take 1 g by mouth 2 (two) times daily., Disp: , Rfl:  .  ondansetron (ZOFRAN ODT) 8 MG disintegrating tablet, Take 1 tablet (8 mg total) by mouth every 8 (eight) hours as needed for nausea or vomiting., Disp: 20 tablet, Rfl: 0 .  potassium chloride (K-DUR,KLOR-CON) 10 MEQ tablet, Take 1 tablet (10 mEq total) by mouth 2 (two) times daily., Disp: 180 tablet, Rfl: 1 .  pravastatin  (PRAVACHOL) 20 MG tablet, Take 0.5 tablets (10 mg total) by mouth daily., Disp: 30 tablet, Rfl: 1 .  Probiotic Product (PROBIOTIC-10 PO), Take by mouth., Disp: , Rfl:  .  Semaglutide (OZEMPIC) 1 MG/DOSE SOPN, Inject 1 mg into the skin once a week., Disp: 6 pen, Rfl: 1 .  triamterene-hydrochlorothiazide (DYAZIDE) 37.5-25 MG capsule, Take 1 each (1 capsule total) by mouth daily. Must use brand name, Disp: 90 capsule, Rfl: 1   PMHx, SurgHx, SocialHx, Medications, and Allergies were reviewed in the Visit Navigator and updated as appropriate.   Past Medical History:  Diagnosis Date  . Acquired hypothyroidism 03/01/2018   hx of goiter; s/p thyroidectomy  . Anxiety   . Atrial premature depolarization   . BPPV (benign paroxysmal positional vertigo) 03/01/2018  . Colon polyps   . DM2 (diabetes mellitus, type 2) ([place]) 03/01/2018  . Essential hypertension 03/01/2018   denies hx - on diuretic for Meniere's and beta blocker for PVCs  . Frequent PVCs 03/01/2018   Controlled on Flecainide, Acebutolol  . GERD (gastroesophageal reflux disease)   . History of cardiac catheterization    Nuc 7/16:  normal perfusion; EF 76 // LHC 8/16:  normal coronary arteries  . History of depression   . History of dizziness   . History of echocardiogram    Echo 03/2006:  Normal  LVEF  . History of shortness of breath   . HLD (hyperlipidemia)   . Hypothyroidism   . IBS (irritable bowel syndrome)   . Meniere's disease      Past Surgical History:  Procedure Laterality Date  . BLADDER SURGERY  1997  . BREAST BIOPSY  2013  . BREAST REDUCTION SURGERY  1999  . CARDIAC CATHETERIZATION  05/2015  . Carpel Tunnel Release    . CHOLECYSTECTOMY  2011  . COLONOSCOPY  08/20/2015  . ESOPHAGOGASTRODUODENOSCOPY  01/13/2017  . [place]  . REDUCTION MAMMAPLASTY    . REFRACTIVE SURGERY    . S/P Eye Right 1967   Muscle Clipped   . THYROIDECTOMY  2012  . TONSILLECTOMY  1975  . TOTAL ABDOMINAL HYSTERECTOMY   1996     Family History  Problem Relation Age of Onset  . Breast cancer Mother   . Heart attack Father   . Pulmonary fibrosis Father   . Peripheral Artery Disease Father        s/p CEA  . Heart disease Father   . Early death Sister   . Depression Brother   . Hearing loss Brother   . Diabetes Daughter   . Heart disease Paternal Grandmother   . Colon cancer Neg Hx   . Stomach cancer Neg Hx     Social History   Tobacco Use  . Smoking status: Never Smoker  . Smokeless tobacco: Never Used  Substance Use Topics  . Alcohol use: Yes    Comment: very limited   . Drug use: Never    Review of Systems:   Pertinent items are noted in the HPI. Otherwise, ROS is negative.  Objective:   BP 112/68   Pulse 73   Temp 98.7 F (37.1 C) (Oral)   Ht 5\' 4"  (1.626 m)   Wt 176 lb 6.4 oz (80 kg)   SpO2 97%   BMI 30.28 kg/m   General appearance: alert, cooperative and appears stated age. Head: normocephalic, without obvious abnormality, atraumatic. Neck: no adenopathy, supple, symmetrical, trachea midline; thyroid not enlarged, symmetric, no tenderness/mass/nodules. Lungs: clear to auscultation bilaterally. Breasts: inspection negative, no nipple retraction or dimpling, no nipple discharge or bleeding, no axillary or supraclavicular adenopathy, normal to palpation without dominant masses. Heart: regular rate and rhythm Abdomen: soft, non-tender; no masses,  no organomegaly. Extremities: extremities normal, atraumatic, no cyanosis or edema. Skin: skin color, texture, turgor normal, no rashes or lesions. Lymph: cervical, supraclavicular, and axillary nodes normal; no abnormal inguinal nodes palpated. Neurologic: grossly normal.  Pelvic:  External genitalia: no lesions. Urethra: normal appearing urethra with no masses, tenderness or lesions. Bartholin's and Skene's: normal. Vagina: normal appearing vagina with normal color and discharge, no lesions. Cervix: normal appearance. Pap and  high risk HPV testing done: No.                                       Assessment/Plan:   Mary Hernandez was seen today for annual exam.  Diagnoses and all orders for this visit:  Routine physical examination  Hyperlipidemia associated with type 2 diabetes mellitus ([place]) -     Lipid panel   Patient Counseling: [x]    Nutrition: Stressed importance of moderation in sodium/caffeine intake, saturated fat and cholesterol, caloric balance, sufficient intake of fresh fruits, vegetables, fiber, calcium, iron, and 1 mg of folate supplement per day (for females capable of  pregnancy).  [x]    Stressed the importance of regular exercise.   [x]    Substance Abuse: Discussed cessation/primary prevention of tobacco, alcohol, or other drug use; driving or other dangerous activities under the influence; availability of treatment for abuse.   [x]    Injury prevention: Discussed safety belts, safety helmets, smoke detector, smoking near bedding or upholstery.   [x]    Sexuality: Discussed sexually transmitted diseases, partner selection, use of condoms, avoidance of unintended pregnancy  and contraceptive alternatives.  [x]    Dental health: Discussed importance of regular tooth brushing, flossing, and dental visits.  [x]    Health maintenance and immunizations reviewed. Please refer to Health maintenance section.   [name], DO [place]

## 2018-07-10 ENCOUNTER — Encounter: Payer: Self-pay | Admitting: Family Medicine

## 2018-07-10 ENCOUNTER — Ambulatory Visit (INDEPENDENT_AMBULATORY_CARE_PROVIDER_SITE_OTHER): Payer: PPO | Admitting: Family Medicine

## 2018-07-10 VITALS — BP 112/68 | HR 73 | Temp 98.7°F | Ht 64.0 in | Wt 176.4 lb

## 2018-07-10 DIAGNOSIS — E1169 Type 2 diabetes mellitus with other specified complication: Secondary | ICD-10-CM | POA: Diagnosis not present

## 2018-07-10 DIAGNOSIS — E785 Hyperlipidemia, unspecified: Secondary | ICD-10-CM | POA: Diagnosis not present

## 2018-07-10 DIAGNOSIS — Z0000 Encounter for general adult medical examination without abnormal findings: Secondary | ICD-10-CM | POA: Diagnosis not present

## 2018-07-10 LAB — LIPID PANEL
Cholesterol: 178 mg/dL (ref 0–200)
HDL: 64.4 mg/dL (ref >39.00)
LDL Cholesterol: 76 mg/dL (ref 0–99)
NonHDL: 113.37
Total CHOL/HDL Ratio: 3
Triglycerides: 187 mg/dL — ABNORMAL HIGH (ref 0.0–149.0)
VLDL: 37.4 mg/dL (ref 0.0–40.0)

## 2018-07-13 ENCOUNTER — Encounter: Payer: Self-pay | Admitting: Family Medicine

## 2018-07-14 ENCOUNTER — Ambulatory Visit (INDEPENDENT_AMBULATORY_CARE_PROVIDER_SITE_OTHER): Payer: PPO | Admitting: Sports Medicine

## 2018-07-14 ENCOUNTER — Ambulatory Visit (INDEPENDENT_AMBULATORY_CARE_PROVIDER_SITE_OTHER): Payer: PPO

## 2018-07-14 ENCOUNTER — Encounter: Payer: Self-pay | Admitting: Sports Medicine

## 2018-07-14 VITALS — BP 112/72 | HR 77 | Ht 64.0 in | Wt 178.8 lb

## 2018-07-14 DIAGNOSIS — M25521 Pain in right elbow: Secondary | ICD-10-CM | POA: Diagnosis not present

## 2018-07-14 DIAGNOSIS — M7918 Myalgia, other site: Secondary | ICD-10-CM | POA: Diagnosis not present

## 2018-07-14 DIAGNOSIS — M9908 Segmental and somatic dysfunction of rib cage: Secondary | ICD-10-CM | POA: Diagnosis not present

## 2018-07-14 DIAGNOSIS — M25511 Pain in right shoulder: Secondary | ICD-10-CM

## 2018-07-14 DIAGNOSIS — M9901 Segmental and somatic dysfunction of cervical region: Secondary | ICD-10-CM | POA: Diagnosis not present

## 2018-07-14 DIAGNOSIS — M25421 Effusion, right elbow: Secondary | ICD-10-CM | POA: Diagnosis not present

## 2018-07-14 DIAGNOSIS — M7711 Lateral epicondylitis, right elbow: Secondary | ICD-10-CM

## 2018-07-14 DIAGNOSIS — M9902 Segmental and somatic dysfunction of thoracic region: Secondary | ICD-10-CM

## 2018-07-14 DIAGNOSIS — M542 Cervicalgia: Secondary | ICD-10-CM | POA: Diagnosis not present

## 2018-07-14 NOTE — Progress Notes (Signed)
PROCEDURE NOTE : OSTEOPATHIC MANIPULATION The decision today to treat with Osteopathic Manipulative Therapy (OMT) was based on physical exam findings. Verbal consent was obtained following a discussion with the patient regarding the of risks, benefits and potential side effects, including an acute pain flare,post manipulation soreness and need for repeat treatments. Additionally, we specifically discussed the minimal risk of  injury to neurovascular structures associated with Cervical manipulation.   Contraindications to OMT: NONE  Manipulation was performed as below: Regions Treated OMT Techniques Used  Cervical spine Thoracic spine Ribs HVLA muscle energy myofascial release   The patient tolerated the treatment well and reported Improved symptoms following treatment today. Patient was given medications, exercises, stretches and lifestyle modifications per AVS and verbally.   OSTEOPATHIC/STRUCTURAL EXAM:   OA - rotated right C4 FRS right (Flexed, Rotated & Sidebent) T2 ERS left (Extended, Rotated & Sidebent) T6 -8 Neutral, Rotated RIGHT, Sidebent LEFT Rib 2 Right  Posterior

## 2018-07-14 NOTE — Progress Notes (Signed)
Mary Hernandez at [place]  Mary Hernandez - 67 y.o. female MRN [id number]  Date of birth: Aug 18, 1951  Visit Date: 07/14/2018  PCP: [name], DO   Referred by: [name], DO   Scribe(s) for today's visit: Josepha Pigg, CMA  SUBJECTIVE:  Mary Hernandez "Mary Hernandez" is here for R elbow pain   HPI: Her R elbow pain and swelling symptoms INITIALLY: Began this past Fri/Sat and MOI is unknown.  Described as mild dull ache at rest but moderate-severe pain with use, radiating to the upper arm.  Worsened with activity Improved with rest Additional associated symptoms include: She is not sure if the "bump" on her elbow is new or if she is just now noticing. She denies increased warmth and erythema. She reports that it is tender to palpation.     At this time symptoms are improving compared to onset. The size is about the same but the tenderness is worsening.  She has been doing PT and taking Celebrex with minimal relief. She has tried applying heat with no change in sx.     REVIEW OF SYSTEMS: Reports night time disturbances. Denies fevers, chills, or night sweats. Denies unexplained weight loss. Denies personal history of cancer. Denies changes in bowel or bladder habits. Denies recent unreported falls. Denies new or worsening dyspnea or wheezing. Denies headaches or dizziness.  Denies numbness, tingling or weakness  In the extremities.  Denies dizziness or presyncopal episodes Denies lower extremity edema    HISTORY:  Prior history reviewed and updated per electronic medical record.  Social History   Occupational History  . Occupation: Retired  Tobacco Use  . Smoking status: Never Smoker  . Smokeless tobacco: Never Used  Substance and Sexual Activity  . Alcohol use: Yes    Comment: very limited   . Drug use: Never  . 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 07/14/2018: Two-view x-ray cervical spine 2 view x-ray elbow: Unremarkable.  No significant degenerative changes.  OBJECTIVE:  VS:  HT:5\' 4"  (162.6 cm)   WT:178 lb 12.8 oz (81.1 kg)  BMI:30.68    BP:112/72  HR:77bpm  TEMP: ( )  RESP:96 %   PHYSICAL EXAM: CONSTITUTIONAL: Well-developed, Well-nourished and In no acute distress PSYCHIATRIC: Alert & appropriately interactive. and Not depressed or anxious appearing. RESPIRATORY: No increased work of breathing and Trachea Midline EYES: Pupils are equal., EOM intact without nystagmus. and No scleral icterus.  VASCULAR EXAM: Warm and well perfused NEURO: unremarkable  MSK Exam: Right arm  Well aligned, no significant deformity. No overlying skin changes. TTP over Medial and lateral elbow with a small amount of swelling in the lateral aspect of the upper arm none.  Marked tenderness across the extensor tendons of the forearm, less tender over the flexors.  Only mild TTP over the lateral epicondyle.   RANGE OF MOTION & STRENGTH  Well-maintained cervical range of motion and full flexion and extension of the elbow.   SPECIALITY TESTING:  Negative Spurling's compression test and Lhermitte's compression test.  No pain with arm squeeze.  Some pain with palpation over the arcade of Frohse's as well as within the common extensor tendons of the forearm.     ASSESSMENT   1. Swelling of right elbow   2. Right shoulder pain, unspecified chronicity   3. Myofascial pain syndrome   4. Lateral epicondylitis of right elbow   5. Somatic dysfunction of cervical region   6. Somatic dysfunction of thoracic region   7. Somatic dysfunction of rib cage region     PLAN:    Pertinent additional documentation may be included in corresponding procedure notes, imaging studies, problem based documentation and patient instructions.  Procedures:  . None  Medications:  No orders of the defined types were placed in this encounter.  Discussion/Instructions: Lateral epicondylitis of right elbow Mild pain over the actual epicondyle however she does have more focal pain over the common extensor tendons and arcade of Frohse.  May be reflective of underlying radial nerve radial neuritis with fascial restrictions.  . She has generalized tightness of the entire right upper extremity with slight swelling over the forearm that is of unclear etiology but likely related to myofascial type pain.  Continue with therapeutic exercises and working with physical therapy.  Dry needling for physical therapy would be beneficial. . Discussed red flag symptoms that warrant earlier emergent evaluation and patient voices understanding. . Activity modifications and the importance of avoiding exacerbating activities (limiting pain to no more than a 4 / 10 during or following activity) recommended and discussed.  Follow-up:  . Return for as scheduled next week.    . If any lack of improvement consider: referral to Physical Therapy  . At follow up will plan to consider: Osteopathic manipulation      CMA/ATC served as scribe during this visit. History, Physical, and Plan performed by medical provider. Documentation and orders reviewed and attested to.      Gerda Diss, [facility] Physician

## 2018-07-14 NOTE — Patient Instructions (Addendum)
Get dry needling for your shoulders  [facility] is who I use - they are over near [place] the gabapentin and look into CAPSACIN cream if the elbow continues to hurt.

## 2018-07-15 ENCOUNTER — Encounter: Payer: Self-pay | Admitting: Family Medicine

## 2018-07-15 ENCOUNTER — Telehealth: Payer: Self-pay | Admitting: Radiology

## 2018-07-15 DIAGNOSIS — M542 Cervicalgia: Secondary | ICD-10-CM | POA: Diagnosis not present

## 2018-07-15 DIAGNOSIS — M25551 Pain in right hip: Secondary | ICD-10-CM | POA: Diagnosis not present

## 2018-07-15 DIAGNOSIS — M25521 Pain in right elbow: Secondary | ICD-10-CM | POA: Diagnosis not present

## 2018-07-15 DIAGNOSIS — M545 Low back pain: Secondary | ICD-10-CM | POA: Diagnosis not present

## 2018-07-15 DIAGNOSIS — M25511 Pain in right shoulder: Secondary | ICD-10-CM | POA: Diagnosis not present

## 2018-07-15 NOTE — Telephone Encounter (Signed)
Copied from [place] [id number]. Topic: Quick Communication - See Telephone Encounter >> Jul 15, 2018  4:15 PM Berneta Levins wrote: CRM for notification. See Telephone encounter for: 07/15/18.  Tammy from [facility] calling.  States pt told them she needed dry needling done, but won't let them do it until they have the office notes from last visit.  Please fax notes to [facility] at [phone].

## 2018-07-16 ENCOUNTER — Other Ambulatory Visit: Payer: Self-pay | Admitting: *Deleted

## 2018-07-16 ENCOUNTER — Encounter: Payer: Self-pay | Admitting: *Deleted

## 2018-07-16 MED ORDER — PRAVASTATIN SODIUM 20 MG PO TABS: 10.0000 mg | ORAL_TABLET | Freq: Every day | ORAL | 1 refills | Status: DC

## 2018-07-17 ENCOUNTER — Encounter: Payer: Self-pay | Admitting: Sports Medicine

## 2018-07-17 ENCOUNTER — Telehealth: Payer: Self-pay | Admitting: Podiatry

## 2018-07-17 ENCOUNTER — Institutional Professional Consult (permissible substitution): Payer: PPO | Admitting: Internal Medicine

## 2018-07-17 ENCOUNTER — Ambulatory Visit (INDEPENDENT_AMBULATORY_CARE_PROVIDER_SITE_OTHER): Payer: PPO | Admitting: Family Medicine

## 2018-07-17 ENCOUNTER — Encounter: Payer: Self-pay | Admitting: Family Medicine

## 2018-07-17 VITALS — BP 118/74 | HR 74 | Temp 98.6°F | Ht 64.0 in | Wt 179.0 lb

## 2018-07-17 DIAGNOSIS — L600 Ingrowing nail: Secondary | ICD-10-CM | POA: Diagnosis not present

## 2018-07-17 DIAGNOSIS — M7918 Myalgia, other site: Secondary | ICD-10-CM | POA: Insufficient documentation

## 2018-07-17 MED ORDER — HYDROCODONE-ACETAMINOPHEN 5-325 MG PO TABS: 1.0000 | ORAL_TABLET | Freq: Four times a day (QID) | ORAL | 0 refills | Status: DC | PRN

## 2018-07-17 MED ORDER — LIDOCAINE HCL 2 % EX GEL: 1.0000 "application " | CUTANEOUS | 0 refills | Status: DC | PRN

## 2018-07-17 MED ORDER — AMOXICILLIN-POT CLAVULANATE 875-125 MG PO TABS: 1.0000 | ORAL_TABLET | Freq: Two times a day (BID) | ORAL | 0 refills | Status: DC

## 2018-07-17 MED ORDER — PRAVASTATIN SODIUM 20 MG PO TABS: 20.0000 mg | ORAL_TABLET | Freq: Every day | ORAL | 1 refills | Status: DC

## 2018-07-17 NOTE — Assessment & Plan Note (Signed)
Mild pain over the actual epicondyle however she does have more focal pain over the common extensor tendons and arcade of Frohse.  May be reflective of underlying radial nerve radial neuritis with fascial restrictions.

## 2018-07-17 NOTE — Telephone Encounter (Signed)
I called pt and she states she is very needle phobic and her doctor has prescribed a medication to take an hour prior to the procedure and a numbing cream to use, but she doesn't know when to apply. I told pt to apply about 8:45am and may reapply if necessary. Pt states understanding. I made note on the Appt List for pt's appt.

## 2018-07-17 NOTE — Progress Notes (Signed)
Mary Hernandez is a 67 y.o. female here for an acute visit.  History of Present Illness:   HPI: Ingrown nail, right foot. Tender. Red. No drainage. Ongoing issue that worsens intermittently. Tried treatment with foot bath, Neosporin. Does not tolerate pain very well.  PMHx, SurgHx, SocialHx, Medications, and Allergies were reviewed in the Visit Navigator and updated as appropriate.  Current Medications:   .  acebutolol (SECTRAL) 200 MG capsule, Take 1 capsule (200 mg total) by mouth daily., Disp: 90 capsule, Rfl: 3 .  Calcium Citrate-Vitamin D 315-250 MG-UNIT TABS, Take by mouth., Disp: , Rfl:  .  celecoxib (CELEBREX) 100 MG capsule, Take 1 capsule (100 mg total) by mouth 2 (two) times daily as needed., Disp: 60 capsule, Rfl: 2 .  cetirizine (ZYRTEC) 10 MG tablet, Take 10 mg by mouth daily., Disp: , Rfl:  .  EVENING PRIMROSE OIL PO, Take 1,500 mg by mouth 2 (two) times daily., Disp: , Rfl:  .  flecainide (TAMBOCOR) 50 MG tablet, Take 1.5 tablets (75 mg total) by mouth 2 (two) times daily., Disp: 270 tablet, Rfl: 3 .  fluticasone (FLONASE) 50 MCG/ACT nasal spray, Place 2 sprays into both nostrils daily., Disp: 16 g, Rfl: 6 .  Glucosamine-Chondroit-Vit C-Mn (GLUCOSAMINE 1500 COMPLEX) CAPS, Take by mouth., Disp: , Rfl:  .  glycopyrrolate (ROBINUL) 1 MG tablet, Take 1 tablet (1 mg total) by mouth 2 (two) times daily., Disp: 180 tablet, Rfl: 1 .  levothyroxine (SYNTHROID, LEVOTHROID) 125 MCG tablet, Take 1 tablet (125 mcg total) by mouth daily before breakfast., Disp: 90 tablet, Rfl: 1 .  omega-3 acid ethyl esters (LOVAZA) 1 g capsule, Take 1 g by mouth 2 (two) times daily., Disp: , Rfl:  .  ondansetron (ZOFRAN ODT) 8 MG disintegrating tablet, Take 1 tablet (8 mg total) by mouth every 8 (eight) hours as needed for nausea or vomiting., Disp: 20 tablet, Rfl: 0 .  potassium chloride (K-DUR,KLOR-CON) 10 MEQ tablet, Take 1 tablet (10 mEq total) by mouth 2 (two) times daily., Disp: 180 tablet, Rfl: 1 .   pravastatin (PRAVACHOL) 20 MG tablet, Take 1 tablet (20 mg total) by mouth daily., Disp: 90 tablet, Rfl: 1 .  Probiotic Product (PROBIOTIC-10 PO), Take by mouth., Disp: , Rfl:  .  Semaglutide (OZEMPIC) 1 MG/DOSE SOPN, Inject 1 mg into the skin once a week., Disp: 6 pen, Rfl: 1 .  triamterene-hydrochlorothiazide (DYAZIDE) 37.5-25 MG capsule, Take 1 each (1 capsule total) by mouth daily. Must use brand name, Disp: 90 capsule, Rfl: 1  Allergies  Allergen Reactions  . Adhesive [Tape] Rash   Review of Systems:   Pertinent items are noted in the HPI. Otherwise, ROS is negative.  Vitals:   Vitals:   07/17/18 0933  BP: 118/74  Pulse: 74  Temp: 98.6 F (37 C)  TempSrc: Oral  SpO2: 96%  Weight: 179 lb (81.2 kg)  Height: 5\' 4"  (1.626 m)     Body mass index is 30.73 kg/m.  Physical Exam:   Physical Exam  Constitutional: She appears well-nourished.  HENT:  Head: Normocephalic and atraumatic.  Eyes: Pupils are equal, round, and reactive to light. EOM are normal.  Neck: Normal range of motion. Neck supple.  Cardiovascular: Normal rate and intact distal pulses.  Pulmonary/Chest: Effort normal.  Musculoskeletal:  Great toe paronychia bilateral edges. No fluctuance or streaking.  Neurological: She is alert.  Skin: Skin is warm.  Psychiatric: She has a normal mood and affect. Her behavior is normal.  Nursing  note and vitals reviewed.  Assessment and Plan:   Atalie was seen today for ingrown toenail.  Diagnoses and all orders for this visit:  Ingrown nail of great toe of right foot -     amoxicillin-clavulanate (AUGMENTIN) 875-125 MG tablet; Take 1 tablet by mouth 2 (two) times daily. -     Ambulatory referral to Podiatry -     lidocaine (XYLOCAINE) 2 % jelly; Apply 1 application topically as needed. -     HYDROcodone-acetaminophen (NORCO/VICODIN) 5-325 MG tablet; Take 1 tablet by mouth every 6 (six) hours as needed for moderate pain.  . Reviewed expectations re: course of  current medical issues. . Discussed self-management of symptoms. . Outlined signs and symptoms indicating need for more acute intervention. . Patient verbalized understanding and all questions were answered. Marland Kitchen Health Maintenance issues including appropriate healthy diet, exercise, and smoking avoidance were discussed with patient. . See orders for this visit as documented in the electronic medical record. . Patient received an After Visit Summary.  [name], DO [place], [facility] 07/17/2018

## 2018-07-17 NOTE — Telephone Encounter (Signed)
Pt wants to speak with you about procedure, and a medication she needs to take before have the ingrown removed

## 2018-07-20 ENCOUNTER — Other Ambulatory Visit: Payer: Self-pay

## 2018-07-20 DIAGNOSIS — M25421 Effusion, right elbow: Secondary | ICD-10-CM

## 2018-07-20 DIAGNOSIS — M25511 Pain in right shoulder: Secondary | ICD-10-CM

## 2018-07-20 NOTE — Telephone Encounter (Signed)
Notes have been faxed

## 2018-07-22 ENCOUNTER — Encounter: Payer: Self-pay | Admitting: Family Medicine

## 2018-07-23 ENCOUNTER — Encounter: Payer: Self-pay | Admitting: Sports Medicine

## 2018-07-23 ENCOUNTER — Telehealth: Payer: Self-pay | Admitting: *Deleted

## 2018-07-23 ENCOUNTER — Ambulatory Visit (INDEPENDENT_AMBULATORY_CARE_PROVIDER_SITE_OTHER): Payer: PPO | Admitting: Sports Medicine

## 2018-07-23 VITALS — BP 110/68 | HR 78 | Ht 64.0 in | Wt 180.2 lb

## 2018-07-23 DIAGNOSIS — M9902 Segmental and somatic dysfunction of thoracic region: Secondary | ICD-10-CM | POA: Diagnosis not present

## 2018-07-23 DIAGNOSIS — G540 Brachial plexus disorders: Secondary | ICD-10-CM

## 2018-07-23 DIAGNOSIS — M9908 Segmental and somatic dysfunction of rib cage: Secondary | ICD-10-CM

## 2018-07-23 DIAGNOSIS — M7918 Myalgia, other site: Secondary | ICD-10-CM

## 2018-07-23 DIAGNOSIS — M25421 Effusion, right elbow: Secondary | ICD-10-CM | POA: Diagnosis not present

## 2018-07-23 DIAGNOSIS — M25511 Pain in right shoulder: Secondary | ICD-10-CM

## 2018-07-23 DIAGNOSIS — M9901 Segmental and somatic dysfunction of cervical region: Secondary | ICD-10-CM | POA: Diagnosis not present

## 2018-07-23 MED ORDER — NITROGLYCERIN 0.2 MG/HR TD PT24: MEDICATED_PATCH | TRANSDERMAL | 1 refills | Status: DC

## 2018-07-23 MED ORDER — AMITRIPTYLINE HCL 25 MG PO TABS: 25.0000 mg | ORAL_TABLET | Freq: Every day | ORAL | 3 refills | Status: DC

## 2018-07-23 NOTE — Telephone Encounter (Signed)
Called pt and advised. Will be in contact once we hear back from insurance.

## 2018-07-23 NOTE — Telephone Encounter (Signed)
See note

## 2018-07-23 NOTE — Telephone Encounter (Signed)
Copied from [place] [id number]. Topic: Quick Communication - Rx Refill/Question >> Jul 23, 2018 12:47 PM Reyne Dumas L wrote: Medication:  amitriptyline (ELAVIL) 25 MG tablet  Pharmacy is telling pt that prior authorization must be done by physician before they can give her the medication.  Pt states that this was a medication that Dr. Paulla Fore wanted her to start today.  Pt wants to know what she needs to do. Pt can be reached at [phone]

## 2018-07-23 NOTE — Telephone Encounter (Signed)
PA has been initiated.

## 2018-07-23 NOTE — Progress Notes (Signed)
PROCEDURE NOTE : OSTEOPATHIC MANIPULATION The decision today to treat with Osteopathic Manipulative Therapy (OMT) was based on physical exam findings. Verbal consent was obtained following a discussion with the patient regarding the of risks, benefits and potential side effects, including an acute pain flare,post manipulation soreness and need for repeat treatments.     Contraindications to OMT: NONE  Manipulation was performed as below: Regions Treated OMT Techniques Used  Cervical spine Thoracic spine Ribs HVLA muscle energy myofascial release   The patient tolerated the treatment well and reported Improved symptoms following treatment today. Patient was given medications, exercises, stretches and lifestyle modifications per AVS and verbally.   OSTEOPATHIC/STRUCTURAL EXAM:   OA - rotated right C4 FRS right (Flexed, Rotated & Sidebent) C6 FRS left (Flexed, Rotated & Sidebent) T2 -6 Neutral, Rotated LEFT, Sidebent RIGHT Rib 6 Right  Posterior

## 2018-07-23 NOTE — Patient Instructions (Addendum)

## 2018-07-23 NOTE — Telephone Encounter (Signed)
Please see message. °

## 2018-07-23 NOTE — Progress Notes (Signed)
Juanda Bond. Ngan Qualls, [facility]  Bevelyn Arriola - 67 y.o. female MRN [id number]  Date of birth: 10/29/1950  Visit Date: 07/23/2018  PCP: [name], DO   Referred by: [name], DO   Scribe(s) for today's visit: Wendy Poet, LAT, ATC  SUBJECTIVE:  Mary Hernandez "Alisa Graff Bettes" is here for Follow-up (R shoulder and elbow pain)   HPI: Her R elbow pain and swelling symptoms INITIALLY: Began this past Fri/Sat and MOI is unknown.  Described as mild dull ache at rest but moderate-severe pain with use, radiating to the upper arm.  Worsened with activity Improved with rest Additional associated symptoms include: She is not sure if the "bump" on her elbow is new or if she is just now noticing. She denies increased warmth and erythema. She reports that it is tender to palpation.     At this time symptoms are improving compared to onset. The size is about the same but the tenderness is worsening.  She has been doing PT and taking Celebrex with minimal relief. She has tried applying heat with no change in sx.   07/23/2018: Compared to the last office visit on 07/14/18, her previously described R elbow and shoulder symptoms show no change.  She states that she had one round of dry needling at [facility] and notes that temporarily helped her R shoulder and neck but notes no change in her elbow. Current symptoms are moderate-severe aching pain that varies w/ activity & are radiating to R upper arm and elbow She has been taking Celebrex and going to PT.  R elbow and C-spine XR - 07/14/18  REVIEW OF SYSTEMS: Reports night time disturbances. Denies fevers, chills, or night sweats. Denies unexplained weight loss. Denies personal history of cancer. Denies changes in bowel or bladder habits. Denies recent unreported falls. Denies new or worsening dyspnea or wheezing. Denies headaches or dizziness.  Denies  numbness, tingling or weakness  In the extremities.  Denies dizziness or presyncopal episodes Denies lower extremity edema    HISTORY:  Prior history reviewed and updated per electronic medical record.  Social History   Occupational History  . Occupation: Retired  Tobacco Use  . Smoking status: Never Smoker  . Smokeless tobacco: Never Used  Substance and Sexual Activity  . Alcohol use: Yes    Comment: very limited   . Drug use: Never  . Sexual activity: Not on file   Social History   Social History Narrative   Retired Licensed conveyancer   Born [place] in [place] in [place] for 62 years   Moved to [place] in 2019   Twin daughters       [place]:   Recent Labs    11/27/17 05/01/18 0955  HGBA1C 5.7 6.1   . 07/14/2018: Two-view x-ray cervical spine 2 view x-ray elbow: Unremarkable.  No significant degenerative changes.  OBJECTIVE:  VS:  HT:5\' 4"  (162.6 cm)   WT:180 lb 3.2 oz (81.7 kg)  BMI:30.92    BP:110/68  HR:78bpm  TEMP: ( )  RESP:96 %   PHYSICAL EXAM: CONSTITUTIONAL: Well-developed, Well-nourished and In no acute distress PSYCHIATRIC: Alert & appropriately interactive. and Not depressed or anxious appearing. RESPIRATORY: No increased work of breathing and Trachea Midline EYES: Pupils are equal., EOM intact without nystagmus. and No scleral icterus.  VASCULAR EXAM: Warm and well perfused NEURO: unremarkable  MSK Exam:  Right arm  Well aligned, no significant deformity. No overlying skin changes. TTP over Medial and lateral elbow with a small amount of swelling in the lateral aspect of the upper arm none.  Marked tenderness across the extensor tendons of the forearm, less tender over the flexors.  Only mild TTP over the lateral epicondyle.   RANGE OF MOTION & STRENGTH  Well-maintained cervical range of motion and full flexion and extension of the elbow.   SPECIALITY TESTING:  Negative Spurling's  compression test and Lhermitte's compression test.  No pain with arm squeeze.  Some pain with palpation over the arcade of Frohse's as well as within the common extensor tendons of the forearm.     ASSESSMENT   1. Right shoulder pain, unspecified chronicity   2. Swelling of right elbow   3. Myofascial pain syndrome   4. Thoracic outlet syndrome   5. Somatic dysfunction of cervical region   6. Somatic dysfunction of thoracic region   7. Somatic dysfunction of rib cage region     PLAN:  Pertinent additional documentation may be included in corresponding procedure notes, imaging studies, problem based documentation and patient instructions.  Procedures:  . Osteopathic manipulation was performed today based on physical exam findings.  Please see procedure note for further information including Osteopathic Exam findings  Medications:  Meds ordered this encounter  Medications  . amitriptyline (ELAVIL) 25 MG tablet    Sig: Take 1 tablet (25 mg total) by mouth at bedtime.    Dispense:  30 tablet    Refill:  3  . nitroGLYCERIN (NITRODUR - DOSED IN MG/24 HR) 0.2 mg/hr patch    Sig: Place 1/4 to 1/2 of a patch over affected region. Remove and replace once daily.  Slightly alter skin placement daily    Dispense:  30 patch    Refill:  1    For musculoskeletal purposes.  Okay to cut patch.   Discussion/Instructions: No problem-specific Assessment & Plan notes found for this encounter.  . She has generalized tightness of the entire right upper extremity with slight swelling over the forearm that is of unclear etiology but likely related to myofascial type pain.  Continue with therapeutic exercises and working with physical therapy.  Dry needling for physical therapy would be beneficial. . Add Amitriptyline qhs and d/c gabapentin . Sx consistent with Thoracic Outlet / Myofascial pain . Continue previously prescribed home exercise program.  . Discussed options with the patient today including  biologic treatment with topical nitroglycerin. Patient has no contraindications & understands the risks, benefits and intentions of treatment. Emphasized the importance of rotating sites as well as appropriate and expected adverse reactions including orthostasis, headache, adhesive sensitivity. Begin with 1/4 patch to the affected area. Okay to titrate to half a patch as tolerated. . Discussed red flag symptoms that warrant earlier emergent evaluation and patient voices understanding. . Activity modifications and the importance of avoiding exacerbating activities (limiting pain to no more than a 4 / 10 during or following activity) recommended and discussed.  Follow-up:  . Return in about 3 weeks (around 08/13/2018) for consideration of repeat Osteopathic Manipulation.    . If any lack of improvement consider: referral to Physical Therapy  . At follow up will plan to consider: Osteopathic manipulation      CMA/ATC served as scribe during this visit. History, Physical, and Plan performed by medical provider. Documentation and orders reviewed and attested to.      Gerda Diss, DO  [facility]

## 2018-07-23 NOTE — Telephone Encounter (Signed)
Patient wants to know what the plan will be if the insurance denies this. She said that Dr Yong Channel wants her to be on this medication as soon as possible. Patient would like a call back, patient is aware the PA has been started.

## 2018-07-24 DIAGNOSIS — M25521 Pain in right elbow: Secondary | ICD-10-CM | POA: Diagnosis not present

## 2018-07-24 DIAGNOSIS — M542 Cervicalgia: Secondary | ICD-10-CM | POA: Diagnosis not present

## 2018-07-24 DIAGNOSIS — M25551 Pain in right hip: Secondary | ICD-10-CM | POA: Diagnosis not present

## 2018-07-24 DIAGNOSIS — M545 Low back pain: Secondary | ICD-10-CM | POA: Diagnosis not present

## 2018-07-24 DIAGNOSIS — M25511 Pain in right shoulder: Secondary | ICD-10-CM | POA: Diagnosis not present

## 2018-07-24 NOTE — Telephone Encounter (Signed)
Called pt and advised that insurance has approved coverage of Amitriptyline.

## 2018-07-28 ENCOUNTER — Ambulatory Visit: Payer: PPO | Admitting: Gastroenterology

## 2018-07-28 ENCOUNTER — Encounter: Payer: Self-pay | Admitting: Gastroenterology

## 2018-07-28 VITALS — BP 116/72 | HR 79 | Ht 65.0 in | Wt <= 1120 oz

## 2018-07-28 DIAGNOSIS — Z86010 Personal history of colonic polyps: Secondary | ICD-10-CM

## 2018-07-28 MED ORDER — PEG-KCL-NACL-NASULF-NA ASC-C 140 G PO SOLR: 140.0000 g | ORAL | 0 refills | Status: DC

## 2018-07-28 NOTE — Progress Notes (Signed)
[facility] Consult Note:  History: Mary Hernandez 07/28/2018  Referring physician: [name], DO  Reason for consult/chief complaint: Colonoscopy (already scheduled for Colon 08/10/18)   Subjective  HPI:  This is a very pleasant woman referred to [place] for history of colon polyps.  Her records came to me for review after some initial difficulty obtaining them from her previous GI physician.  From my recent telephone note summarizing her previous records: "Records reviewed from [facility] [place]   (For documentation: Patient was having epigastric pain spring 2019.  EGD 01/13/2017 showed normal esophagus, nonerosive gastritis, biopsies gastric antrum and body negative for H. pylori.  Normal duodenum.  Pain was felt to be functional and patient was on Bentyl with improvement.  Had previously been on Elavil for diarrhea predominant IBS.    Colonoscopy November 2016 complete to the terminal ileum, preparation good.  Cecal polyp described as 5 mm on colonoscopy report, however measured a 1.2 cm in pathology.  Tubular adenoma.  Transverse colon polyp described as 11 mm, pathology measures at 8 mm, reportedly sessile serrated polyp without dysplasia.  An additional diminutive polyp in transverse colon was fulgurated with snare tip, so no pathology available.  Grade 2 internal hemorrhoids reported as well  Colonoscopy 08/16/2013 for chronic diarrhea reports normal terminal ileum, normal colon without biopsies.  EGD November 2014 dysphagia and chronic diarrhea shows grade 1 esophagitis, small bowel biopsies with mild nonspecific lymphocytosis, normal villous architecture.  Screening colonoscopy June 2012 terminal ileum, internal hemorrhoids, otherwise normal.  EGD January 2010 for GERD, normal study.  Colonoscopy 01/04/2008 for screening, removed apparent polyp which on pathology was lymphoid aggregate with hyperplastic  changes.)   This patient is due for a surveillance colonoscopy for history of adenomatous colon polyps.  If she is not having any particular problems she would like addressed at an office visit, please arrange a direct book colonoscopy with [facility] nurse so patient can be put on my schedule."  She is scheduled for a colonoscopy with me next month, and wanted to meet me at a clinic visit today.  ROS:  Review of Systems  She denies chest pain dyspnea or dysuria Lately has had right shoulder and arm pain being treated by primary care.  Past Medical History: Past Medical History:  Diagnosis Date  . Acquired hypothyroidism 03/01/2018   hx of goiter; s/p thyroidectomy  . Anxiety   . Atrial premature depolarization   . BPPV (benign paroxysmal positional vertigo) 03/01/2018  . Colon polyps   . DM2 (diabetes mellitus, type 2) ([employer]) 03/01/2018  . Essential hypertension 03/01/2018   denies hx - on diuretic for Meniere's and beta blocker for PVCs  . Frequent PVCs 03/01/2018   Controlled on Flecainide, Acebutolol  . GERD (gastroesophageal reflux disease)   . History of cardiac catheterization    Nuc 7/16:  normal perfusion; EF 76 // LHC 8/16:  normal coronary arteries  . History of depression   . History of dizziness   . History of echocardiogram    Echo 03/2006:  Normal LVEF  . History of shortness of breath   . HLD (hyperlipidemia)   . Hypothyroidism   . IBS (irritable bowel syndrome)   . Meniere's disease      Past Surgical History: Past Surgical History:  Procedure Laterality Date  . BLADDER SURGERY  1997  . BREAST BIOPSY  2013  . BREAST REDUCTION SURGERY  1999  . CARDIAC CATHETERIZATION  05/2015  .  Carpel Tunnel Release    . CHOLECYSTECTOMY  2011  . COLONOSCOPY  08/20/2015  . ESOPHAGOGASTRODUODENOSCOPY  01/13/2017  . [place]  . REDUCTION MAMMAPLASTY    . REFRACTIVE SURGERY    . S/P Eye Right 1967   Muscle Clipped   . THYROIDECTOMY  2012  .  TONSILLECTOMY  1975  . TOTAL ABDOMINAL HYSTERECTOMY  1996     Family History: Family History  Problem Relation Age of Onset  . Breast cancer Mother   . Heart attack Father   . Pulmonary fibrosis Father   . Peripheral Artery Disease Father        s/p CEA  . Heart disease Father   . Early death Sister   . Depression Brother   . Hearing loss Brother   . Diabetes Daughter   . Heart disease Paternal Grandmother   . Colon cancer Neg Hx   . Stomach cancer Neg Hx     Social History: Social History   Socioeconomic History  . Marital status: Married    Spouse name: Not on file  . Number of children: 2  . Years of education: Not on file  . Highest education level: Not on file  Occupational History  . Occupation: Retired  Scientific laboratory technician  . Financial resource strain: Not on file  . Food insecurity:    Worry: Not on file    Inability: Not on file  . Transportation needs:    Medical: Not on file    Non-medical: Not on file  Tobacco Use  . Smoking status: Never Smoker  . Smokeless tobacco: Never Used  Substance and Sexual Activity  . Alcohol use: Yes    Comment: very limited   . Drug use: Never  . Sexual activity: Not on file  Lifestyle  . Physical activity:    Days per week: Not on file    Minutes per session: Not on file  . Stress: Not on file  Relationships  . Social connections:    Talks on phone: Not on file    Gets together: Not on file    Attends religious service: Not on file    Active member of club or organization: Not on file    Attends meetings of clubs or organizations: Not on file    Relationship status: Not on file  Other Topics Concern  . Not on file  Social History Narrative   Retired Licensed conveyancer   Born [place] up in [place] in [place] for 20 years   Moved to [place] in 2019   Twin daughters     Allergies: Allergies  Allergen Reactions  . Adhesive [Tape] Rash    Outpatient Meds: Current Outpatient  Medications  Medication Sig Dispense Refill  . acebutolol (SECTRAL) 200 MG capsule Take 1 capsule (200 mg total) by mouth daily. 90 capsule 3  . amitriptyline (ELAVIL) 25 MG tablet Take 1 tablet (25 mg total) by mouth at bedtime. 30 tablet 3  . Calcium Citrate-Vitamin D 315-250 MG-UNIT TABS Take by mouth.    . cetirizine (ZYRTEC) 10 MG tablet Take 10 mg by mouth daily.    Marland Kitchen EVENING PRIMROSE OIL PO Take 1,500 mg by mouth 2 (two) times daily.    . flecainide (TAMBOCOR) 50 MG tablet Take 1.5 tablets (75 mg total) by mouth 2 (two) times daily. 270 tablet 3  . fluticasone (FLONASE) 50 MCG/ACT nasal spray Place 2 sprays into both nostrils  daily. 16 g 6  . Glucosamine-Chondroit-Vit C-Mn (GLUCOSAMINE 1500 COMPLEX) CAPS Take by mouth.    Marland Kitchen glycopyrrolate (ROBINUL) 1 MG tablet Take 1 tablet (1 mg total) by mouth 2 (two) times daily. 180 tablet 1  . HYDROcodone-acetaminophen (NORCO/VICODIN) 5-325 MG tablet Take 1 tablet by mouth every 6 (six) hours as needed for moderate pain. 5 tablet 0  . levothyroxine (SYNTHROID, LEVOTHROID) 125 MCG tablet Take 1 tablet (125 mcg total) by mouth daily before breakfast. 90 tablet 1  . lidocaine (XYLOCAINE) 2 % jelly Apply 1 application topically as needed. 30 mL 0  . nitroGLYCERIN (NITRODUR - DOSED IN MG/24 HR) 0.2 mg/hr patch Place 1/4 to 1/2 of a patch over affected region. Remove and replace once daily.  Slightly alter skin placement daily 30 patch 1  . omega-3 acid ethyl esters (LOVAZA) 1 g capsule Take 1 g by mouth 2 (two) times daily.    . ondansetron (ZOFRAN ODT) 8 MG disintegrating tablet Take 1 tablet (8 mg total) by mouth every 8 (eight) hours as needed for nausea or vomiting. 20 tablet 0  . potassium chloride (K-DUR,KLOR-CON) 10 MEQ tablet Take 1 tablet (10 mEq total) by mouth 2 (two) times daily. 180 tablet 1  . pravastatin (PRAVACHOL) 20 MG tablet Take 1 tablet (20 mg total) by mouth daily. 90 tablet 1  . Probiotic Product (PROBIOTIC-10 PO) Take by mouth.    .  Semaglutide (OZEMPIC) 1 MG/DOSE SOPN Inject 1 mg into the skin once a week. 6 pen 1  . triamterene-hydrochlorothiazide (DYAZIDE) 37.5-25 MG capsule Take 1 each (1 capsule total) by mouth daily. Must use brand name 90 capsule 1  . metoprolol tartrate (LOPRESSOR) 50 MG tablet Take 1 tablet (50 mg total) by mouth once for 1 dose. Take one hour prior to your cardiac CT. 1 tablet 0  . PEG-KCl-NaCl-NaSulf-Na Asc-C (PLENVU) 140 g SOLR Take 140 g by mouth as directed. 1 each 0   No current facility-administered medications for this visit.       ___________________________________________________________________ Objective   Exam:  BP 116/72   Pulse 79   Ht 5\' 5"  (1.651 m)   Wt 18 lb 4 oz (8.278 kg)   BMI 3.04 kg/m    General: this is a(n) well-appearing woman  Eyes: sclera anicteric, no redness  ENT: oral mucosa moist without lesions, no cervical or supraclavicular lymphadenopathy, good dentition  CV: RRR without murmur, S1/S2, no JVD, no peripheral edema  Resp: clear to auscultation bilaterally, normal RR and effort noted  GI: soft, no tenderness, with active bowel sounds. No guarding or palpable organomegaly noted.    Labs:  CBC Latest Ref Rng & Units 05/01/2018 11/27/2017  WBC 4.0 - 10.5 K/uL 6.6 6.4  Hemoglobin 12.0 - 15.0 g/dL 13.5 13.3  Hematocrit 36.0 - 46.0 % 40.1 41  Platelets 150.0 - 400.0 K/uL 215.0 248    Assessment: Encounter Diagnosis  Name Primary?  . Personal history of colonic polyps Yes    She had questions regarding review of her prior records, current surveillance guidelines, type of sedation used in bowel preparation as well as other logistical questions related to the colonoscopy.  All questions were answered, total 30-minute visit, over half spent face-to-face with patient and husband, remainder in record review.   Thank you for the courtesy of this consult.  Please call me with any questions or concerns.  Nelida Meuse III  CC: [name],  DO

## 2018-07-28 NOTE — Patient Instructions (Signed)
If you are age 66 or older, your body mass index should be between 23-30. Your Body mass index is 3.04 kg/m. If this is out of the aforementioned range listed, please consider follow up with your Primary Care Provider.  If you are age 51 or younger, your body mass index should be between 19-25. Your Body mass index is 3.04 kg/m. If this is out of the aformentioned range listed, please consider follow up with your Primary Care Provider.   You have been scheduled for a colonoscopy. Please follow written instructions given to you at your visit today.  Please pick up your prep supplies at the pharmacy within the next 1-3 days. If you use inhalers (even only as needed), please bring them with you on the day of your procedure. Your physician has requested that you go to www.startemmi.com and enter the access code given to you at your visit today. This web site gives a general overview about your procedure. However, you should still follow specific instructions given to you by our office regarding your preparation for the procedure.  It was a pleasure to see you today!  Dr. Loletha Carrow

## 2018-07-29 DIAGNOSIS — M25551 Pain in right hip: Secondary | ICD-10-CM | POA: Diagnosis not present

## 2018-07-29 DIAGNOSIS — M25521 Pain in right elbow: Secondary | ICD-10-CM | POA: Diagnosis not present

## 2018-07-29 DIAGNOSIS — M542 Cervicalgia: Secondary | ICD-10-CM | POA: Diagnosis not present

## 2018-07-29 DIAGNOSIS — M25511 Pain in right shoulder: Secondary | ICD-10-CM | POA: Diagnosis not present

## 2018-07-29 DIAGNOSIS — M545 Low back pain: Secondary | ICD-10-CM | POA: Diagnosis not present

## 2018-07-31 ENCOUNTER — Telehealth: Payer: Self-pay | Admitting: Gastroenterology

## 2018-07-31 ENCOUNTER — Ambulatory Visit: Payer: PPO | Admitting: Podiatry

## 2018-07-31 ENCOUNTER — Encounter: Payer: Self-pay | Admitting: Podiatry

## 2018-07-31 DIAGNOSIS — L600 Ingrowing nail: Secondary | ICD-10-CM

## 2018-07-31 MED ORDER — NEOMYCIN-POLYMYXIN-HC 3.5-10000-1 OT SOLN: OTIC | 0 refills | Status: DC

## 2018-07-31 NOTE — Progress Notes (Signed)
   Subjective:    Patient ID: Mary Hernandez, female    DOB: [birth year], 67 y.o.   MRN: [id number]  HPI    Review of Systems  All other systems reviewed and are negative.      Objective:   Physical Exam        Assessment & Plan:

## 2018-07-31 NOTE — Telephone Encounter (Signed)
Returned patients call. A sample of Plenvu was left at the 3rd floor front desk for the patient. Patient will pick it up on Monday.   Riki Sheer, LPN ( [facility] )

## 2018-07-31 NOTE — Patient Instructions (Signed)

## 2018-08-03 ENCOUNTER — Encounter: Payer: Self-pay | Admitting: Gastroenterology

## 2018-08-04 ENCOUNTER — Encounter: Payer: Self-pay | Admitting: Podiatry

## 2018-08-05 DIAGNOSIS — M25521 Pain in right elbow: Secondary | ICD-10-CM | POA: Diagnosis not present

## 2018-08-05 DIAGNOSIS — M25511 Pain in right shoulder: Secondary | ICD-10-CM | POA: Diagnosis not present

## 2018-08-05 DIAGNOSIS — M542 Cervicalgia: Secondary | ICD-10-CM | POA: Diagnosis not present

## 2018-08-05 DIAGNOSIS — M545 Low back pain: Secondary | ICD-10-CM | POA: Diagnosis not present

## 2018-08-05 DIAGNOSIS — M25551 Pain in right hip: Secondary | ICD-10-CM | POA: Diagnosis not present

## 2018-08-05 NOTE — Telephone Encounter (Signed)
Left message requesting a call back to discuss her toe.

## 2018-08-05 NOTE — Telephone Encounter (Signed)
Pt returned my call. I asked pt, what post procedure soak she was performing. Pt states epsom salt. I told pt the toenail procedure site was look about like it should at this timeframe, but the rash looked like it could be caused by an irritant from the soak or a bandaid. I asked pt if she was rinsing after the epsom salt soaks. Pt stated no. I told pt to change to 1/2 cup white vinegar to 1 quart of warm water and rinse after the soak and continue with the drops, then cover the toe with a trimmed telfa and wrap with gauze. Pt states she is just using the gauze after the drops and using a sock to hold in place. I told pt that was fine, and to call or write with any questions. Pt states understanding.

## 2018-08-05 NOTE — Progress Notes (Signed)
Subjective:   Patient ID: Mary Hernandez, female   DOB: 66 y.o.   MRN: [id number]   HPI Patient presents stating this ingrown toenail is really bother me make it hard for me to be comfortable.  Patient states she is on antibiotics is not been draining but it still sore patient has irritation of the border and states that she is tried to soak it trim it herself.  Patient does not smoke likes to be active   Review of Systems  All other systems reviewed and are negative.       Objective:  Physical Exam  Constitutional: She appears well-developed and well-nourished.  Cardiovascular: Intact distal pulses.  Pulmonary/Chest: Effort normal.  Musculoskeletal: Normal range of motion.  Neurological: She is alert.  Skin: Skin is warm.  Nursing note and vitals reviewed.   Neurovascular status intact muscle strength is adequate range of motion within normal limits with patient found to have incurvated painful right hallux lateral border with no drainage or redness noted.  There is structural deformity of the nailbed and it is painful when palpated.  Patient has good digital perfusion well oriented x3     Assessment:  Chronic ingrown toenail deformity right hallux lateral border with no indications of acute infection     Plan:  H&P condition reviewed and recommended nail correction.  Explained procedure and risks and I reviewed with her the surgery to correct this.  Patient wants surgery understanding risk and at this time I infiltrated the right hallux 60 mg like Marcaine mixture remove the border exposed matrix and applied phenol 3 applications 30 seconds followed by alcohol lavage sterile dressing.  Gave instructions on soaks and patient is instructed to leave dressing on 24 hours but to take it off earlier if it should start to throb.  Patient will be checked back in the next couple weeks and is encouraged to call with any questions

## 2018-08-10 ENCOUNTER — Ambulatory Visit (AMBULATORY_SURGERY_CENTER): Payer: PPO | Admitting: Gastroenterology

## 2018-08-10 ENCOUNTER — Encounter: Payer: Self-pay | Admitting: Gastroenterology

## 2018-08-10 VITALS — BP 155/76 | HR 79 | Temp 96.4°F | Resp 13 | Ht 65.0 in | Wt 180.0 lb

## 2018-08-10 DIAGNOSIS — Z1211 Encounter for screening for malignant neoplasm of colon: Secondary | ICD-10-CM | POA: Diagnosis not present

## 2018-08-10 DIAGNOSIS — Z86010 Personal history of colonic polyps: Secondary | ICD-10-CM | POA: Diagnosis not present

## 2018-08-10 DIAGNOSIS — I10 Essential (primary) hypertension: Secondary | ICD-10-CM | POA: Diagnosis not present

## 2018-08-10 DIAGNOSIS — E119 Type 2 diabetes mellitus without complications: Secondary | ICD-10-CM | POA: Diagnosis not present

## 2018-08-10 MED ORDER — SODIUM CHLORIDE 0.9 % IV SOLN: 500.0000 mL | Freq: Once | INTRAVENOUS | Status: DC

## 2018-08-10 NOTE — Patient Instructions (Signed)
YOU HAD AN ENDOSCOPIC PROCEDURE TODAY AT THE [facility]:   Refer to the procedure report that was given to you for any specific questions about what was found during the examination.  If the procedure report does not answer your questions, please call your gastroenterologist to clarify.  If you requested that your care partner not be given the details of your procedure findings, then the procedure report has been included in a sealed envelope for you to review at your convenience later.  YOU SHOULD EXPECT: Some feelings of bloating in the abdomen. Passage of more gas than usual.  Walking can help get rid of the air that was put into your GI tract during the procedure and reduce the bloating. If you had a lower endoscopy (such as a colonoscopy or flexible sigmoidoscopy) you may notice spotting of blood in your stool or on the toilet paper. If you underwent a bowel prep for your procedure, you may not have a normal bowel movement for a few days.  Please Note:  You might notice some irritation and congestion in your nose or some drainage.  This is from the oxygen used during your procedure.  There is no need for concern and it should clear up in a day or so.  SYMPTOMS TO REPORT IMMEDIATELY:   Following lower endoscopy (colonoscopy or flexible sigmoidoscopy):  Excessive amounts of blood in the stool  Significant tenderness or worsening of abdominal pains  Swelling of the abdomen that is new, acute  Fever of 100F or higher  For urgent or emergent issues, a gastroenterologist can be reached at any hour by calling [phone].   DIET:  We do recommend a small meal at first, but then you may proceed to your regular diet.  Drink plenty of fluids but you should avoid alcoholic beverages for 24 hours.  ACTIVITY:  You should plan to take it easy for the rest of today and you should NOT DRIVE or use heavy machinery until tomorrow (because of the sedation medicines used during the test).     FOLLOW UP: Our staff will call the number listed on your records the next business day following your procedure to check on you and address any questions or concerns that you may have regarding the information given to you following your procedure. If we do not reach you, we will leave a message.  However, if you are feeling well and you are not experiencing any problems, there is no need to return our call.  We will assume that you have returned to your regular daily activities without incident.  If any biopsies were taken you will be contacted by phone or by letter within the next 1-3 weeks.  Please call us at [phone] if you have not heard about the biopsies in 3 weeks.    SIGNATURES/CONFIDENTIALITY: You and/or your care partner have signed paperwork which will be entered into your electronic medical record.  These signatures attest to the fact that that the information above on your After Visit Summary has been reviewed and is understood.  Full responsibility of the confidentiality of this discharge information lies with you and/or your care-partner. 

## 2018-08-10 NOTE — Op Note (Signed)
[facility] Patient Name: Mary Hernandez Procedure Date: 08/10/2018 2:16 PM MRN: [id number] Endoscopist: Mallie Mussel L. Loletha Carrow , MD Age: 67 Referring MD:  Date of Birth: 11-11-1950 Gender: Female Account #: [id number] Procedure:                Colonoscopy Indications:              Surveillance: Personal history of adenomatous                            polyps on last colonoscopy 3 years ago (TA <66mm                            and TA > 4mm in 08/2015 at another institution) Medicines:                Monitored Anesthesia Care Procedure:                Pre-Anesthesia Assessment:                           - Prior to the procedure, a History and Physical                            was performed, and patient medications and                            allergies were reviewed. The patient's tolerance of                            previous anesthesia was also reviewed. The risks                            and benefits of the procedure and the sedation                            options and risks were discussed with the patient.                            All questions were answered, and informed consent                            was obtained. Prior Anticoagulants: The patient has                            taken no previous anticoagulant or antiplatelet                            agents. ASA Grade Assessment: II - A patient with                            mild systemic disease. After reviewing the risks                            and benefits, the patient was deemed in  satisfactory condition to undergo the procedure.                           After obtaining informed consent, the colonoscope                            was passed under direct vision. Throughout the                            procedure, the patient's blood pressure, pulse, and                            oxygen saturations were monitored continuously. The                            Model PCF-H190DL  [id number]) scope was introduced                            through the anus and advanced to the the cecum,                            identified by appendiceal orifice and ileocecal                            valve. The colonoscopy was somewhat difficult due                            to a redundant colon. Successful completion of the                            procedure was aided by using manual pressure. The                            quality of the bowel preparation was good. The                            ileocecal valve, appendiceal orifice, and rectum                            were photographed. The quality of the bowel                            preparation was evaluated using the BBPS [facility]                            Bowel Preparation Scale) with scores of: Right                            Colon = 2, Transverse Colon = 2 and Left Colon = 2.                            The total BBPS score equals 6.,after lavage. The  bowel preparation used was Plenvu. Scope In: 2:27:48 PM Scope Out: 2:47:47 PM Scope Withdrawal Time: 0 hours 12 minutes 36 seconds  Total Procedure Duration: 0 hours 19 minutes 59 seconds  Findings:                 The perianal and digital rectal examinations were                            normal.                           The sigmoid colon was redundant.                           The exam was otherwise without abnormality on                            direct and retroflexion views. Complications:            No immediate complications. Estimated Blood Loss:     Estimated blood loss: none. Impression:               - Redundant colon.                           - The examination was otherwise normal on direct                            and retroflexion views.                           - No specimens collected. Recommendation:           - Patient has a contact number available for                            emergencies. The signs and  symptoms of potential                            delayed complications were discussed with the                            patient. Return to normal activities tomorrow.                            Written discharge instructions were provided to the                            patient.                           - Resume previous diet.                           - Continue present medications.                           - Repeat colonoscopy in 5 years for surveillance. Anetta Olvera L. Loletha Carrow, MD 08/10/2018 2:53:41  PM This report has been signed electronically.

## 2018-08-10 NOTE — Progress Notes (Signed)
Pt's states no medical or surgical changes since previsit or office visit. 

## 2018-08-10 NOTE — Progress Notes (Signed)
Report given to PACU, vss 

## 2018-08-11 ENCOUNTER — Telehealth: Payer: Self-pay | Admitting: *Deleted

## 2018-08-11 ENCOUNTER — Telehealth: Payer: Self-pay

## 2018-08-11 NOTE — Telephone Encounter (Signed)
  Follow up Call-  Call back number 08/10/2018  Post procedure Call Back phone  # [phone]  Permission to leave phone message Yes  Some recent data might be hidden    [facility]

## 2018-08-11 NOTE — Telephone Encounter (Signed)
Was not able to leave message, will try again later this afternoon.

## 2018-08-12 ENCOUNTER — Ambulatory Visit (INDEPENDENT_AMBULATORY_CARE_PROVIDER_SITE_OTHER): Payer: PPO | Admitting: Sports Medicine

## 2018-08-12 ENCOUNTER — Encounter: Payer: Self-pay | Admitting: Sports Medicine

## 2018-08-12 VITALS — BP 102/66 | HR 75 | Ht 65.0 in | Wt 176.4 lb

## 2018-08-12 DIAGNOSIS — M7918 Myalgia, other site: Secondary | ICD-10-CM | POA: Diagnosis not present

## 2018-08-12 DIAGNOSIS — M25511 Pain in right shoulder: Secondary | ICD-10-CM | POA: Diagnosis not present

## 2018-08-12 DIAGNOSIS — M7711 Lateral epicondylitis, right elbow: Secondary | ICD-10-CM

## 2018-08-12 DIAGNOSIS — G8929 Other chronic pain: Secondary | ICD-10-CM | POA: Diagnosis not present

## 2018-08-12 DIAGNOSIS — M25521 Pain in right elbow: Secondary | ICD-10-CM | POA: Diagnosis not present

## 2018-08-12 DIAGNOSIS — M75101 Unspecified rotator cuff tear or rupture of right shoulder, not specified as traumatic: Secondary | ICD-10-CM

## 2018-08-12 NOTE — Progress Notes (Signed)
Juanda Bond. Desera Graffeo, [place] [facility] [place]  [place] - 67 y.o. female MRN [id number]  Date of birth: 1951-04-03  Visit Date: 08/12/2018  PCP: [name], DO   Referred by: [name], DO   Scribe(s) for today's visit: Wendy Poet, LAT, ATC  SUBJECTIVE:  Mary Hernandez is here for Follow-up (R shoulder and R elbow pain)   HPI: Her R elbow pain and swelling symptoms INITIALLY: Began this past Fri/Sat and MOI is unknown.  Described as mild dull ache at rest but moderate-severe pain with use, radiating to the upper arm.  Worsened with activity Improved with rest Additional associated symptoms include: She is not sure if the "bump" on her elbow is new or if she is just now noticing. She denies increased warmth and erythema. She reports that it is tender to palpation.     At this time symptoms are improving compared to onset. The size is about the same but the tenderness is worsening.  She has been doing PT and taking Celebrex with minimal relief. She has tried applying heat with no change in sx.   07/23/2018: Compared to the last office visit on 07/14/18, her previously described R elbow and shoulder symptoms show no change.  She states that she had one round of dry needling at [facility] and notes that temporarily helped her R shoulder and neck but notes no change in her elbow. Current symptoms are moderate-severe aching pain that varies w/ activity & are radiating to R upper arm and elbow She has been taking Celebrex and going to PT.  08/12/2018: Compared to the last office visit on 07/23/18, her previously described R elbow and shoulder symptoms are improving slightly.  She's had 4 rounds of dry needling and has another appt on Friday.  She states that she gets mild relief for a few days w/ the dry needling but nothing long-lasting.  She states that she doesn't feel that the nitro patches are helping her  R shoulder. Current symptoms are moderate-severe aching pain that varies in intensity depending on activity & are radiating to R upper arm She has been taking Celebrex and Amitriptyline and using the nitroglycerin patches. She's been going to PT at [facility] x 4 visits.  R elbow and C-spine XR - 07/14/18  REVIEW OF SYSTEMS: Reports night time disturbances. Denies fevers, chills, or night sweats. Denies unexplained weight loss. Denies personal history of cancer. Denies changes in bowel or bladder habits. Denies recent unreported falls. Denies new or worsening dyspnea or wheezing. Denies headaches or dizziness.  Denies numbness, tingling or weakness  In the extremities.  Denies dizziness or presyncopal episodes Denies lower extremity edema    HISTORY:  Prior history reviewed and updated per electronic medical record.  Social History   Occupational History  . Occupation: Retired  Tobacco Use  . Smoking status: Never Smoker  . Smokeless tobacco: Never Used  Substance and Sexual Activity  . Alcohol use: Yes    Comment: very limited   . Drug use: Never  . Sexual activity: Not on file   Social History   Social History Narrative   Retired Licensed conveyancer   Born [place] in [place] for 52 years   Moved to [place] in 2019   Twin daughters       DATA OBTAINED & REVIEWED:   Recent Labs  11/27/17 05/01/18 0955 10/12/18 0955  HGBA1C 5.7 6.1 5.9   . 07/14/2018: Two-view x-ray cervical spine 2 view x-ray elbow: Unremarkable.  No significant degenerative changes.  OBJECTIVE:  VS:  HT:5\' 5"  (176.1 cm)   WT:176 lb 6.4 oz (80 kg)  BMI:29.35    BP:102/66  HR:75bpm  TEMP: ( )  RESP:94 %   PHYSICAL EXAM: CONSTITUTIONAL: Well-developed, Well-nourished and In no acute distress PSYCHIATRIC: Alert & appropriately interactive. and Not depressed or anxious appearing. RESPIRATORY: No increased work of breathing and Trachea  Midline EYES: Pupils are equal., EOM intact without nystagmus. and No scleral icterus.  VASCULAR EXAM: Warm and well perfused NEURO: unremarkable  MSK Exam: Right arm  Well aligned, no significant deformity. No overlying skin changes. TTP over Medial and lateral elbow with a small amount of swelling in the lateral aspect of the upper arm none.  Marked tenderness across the extensor tendons of the forearm, less tender over the flexors.  Only mild TTP over the lateral epicondyle.   RANGE OF MOTION & STRENGTH  Well-maintained cervical range of motion and full flexion and extension of the elbow.   Right shoulder with painful shoulder arc and positive Hawkins.  Empty can test is slightly weak and painful. Marked TTP over the lateral condyle.   ASSESSMENT   1. Right shoulder pain, unspecified chronicity   2. Right elbow pain   3. Chronic right shoulder pain   4. Myofascial pain syndrome   5. Lateral epicondylitis of right elbow   6. Rotator cuff syndrome of right shoulder     PLAN:  Pertinent additional documentation may be included in corresponding procedure notes, imaging studies, problem based documentation and patient instructions.  Procedures:  . None  Medications:  No orders of the defined types were placed in this encounter.  Discussion/Instructions: No problem-specific Assessment & Plan notes found for this encounter.  . Symptoms are consistent with rotator cuff tendinopathy as well as severe lateral epicondylosis.  Further diagnostic evaluation given failure of previous treatments indicated at this time.  Follow-up:  . Return for MRI results review in person.       CMA/ATC served as Education administrator during this visit. History, Physical, and Plan performed by medical provider. Documentation and orders reviewed and attested to.      Gerda Diss, [facility] Physician

## 2018-08-12 NOTE — Patient Instructions (Addendum)
We are ordering an MRI for you today.  The imaging office will be calling you to schedule your appointment after we obtain authorization from your insurance company.   Please be sure you have signed up for MyChart so that we can get your results to you.  We will be in touch with you as soon as we can.  Please know, it can take up to 3-4 business days for the radiologist and Dr. Rigby to have time to review the results and determine the best appropriate action.  If there is something that appears to be surgical or needs a referral to other specialists we will let you know through MyChart or telephone.  Otherwise we will plan to schedule a follow up appointment with Dr. Rigby once we have the results.    [facility]: [phone]  

## 2018-08-14 DIAGNOSIS — M25521 Pain in right elbow: Secondary | ICD-10-CM | POA: Diagnosis not present

## 2018-08-14 DIAGNOSIS — M542 Cervicalgia: Secondary | ICD-10-CM | POA: Diagnosis not present

## 2018-08-14 DIAGNOSIS — M545 Low back pain: Secondary | ICD-10-CM | POA: Diagnosis not present

## 2018-08-14 DIAGNOSIS — M25551 Pain in right hip: Secondary | ICD-10-CM | POA: Diagnosis not present

## 2018-08-14 DIAGNOSIS — M25511 Pain in right shoulder: Secondary | ICD-10-CM | POA: Diagnosis not present

## 2018-08-22 ENCOUNTER — Ambulatory Visit
Admission: RE | Admit: 2018-08-22 | Discharge: 2018-08-22 | Disposition: A | Discharge status: Home / Self Care | Payer: PPO | Source: Ambulatory Visit | Attending: Sports Medicine | Admitting: Sports Medicine

## 2018-08-22 DIAGNOSIS — M25521 Pain in right elbow: Secondary | ICD-10-CM

## 2018-08-22 DIAGNOSIS — M25511 Pain in right shoulder: Secondary | ICD-10-CM | POA: Diagnosis not present

## 2018-08-22 DIAGNOSIS — S56511A Strain of other extensor muscle, fascia and tendon at forearm level, right arm, initial encounter: Secondary | ICD-10-CM | POA: Diagnosis not present

## 2018-08-22 DIAGNOSIS — G8929 Other chronic pain: Secondary | ICD-10-CM

## 2018-08-24 ENCOUNTER — Telehealth: Payer: Self-pay | Admitting: Sports Medicine

## 2018-08-24 NOTE — Progress Notes (Signed)
We will plan to follow-up at tomorrow's visit on 08/25/2018.

## 2018-08-24 NOTE — Telephone Encounter (Signed)
See note  Copied from [facility] [id number]. Topic: General - Inquiry >> Aug 24, 2018  9:24 AM Mary Hernandez, NT wrote: Reason for CRM: patient is calling and states she had a MRI on Saturday and Dr. Paulla Fore wanted to discuss it with her before she left time. She states she leaves town Wednesday morning so she will need to hear from him by tomorrow morning. Please advise and contact patient.

## 2018-08-24 NOTE — Telephone Encounter (Signed)
Called pt and scheduled her for MRI f/u visit at 8:40 am on 08/25/18

## 2018-08-25 ENCOUNTER — Ambulatory Visit (INDEPENDENT_AMBULATORY_CARE_PROVIDER_SITE_OTHER): Payer: PPO | Admitting: Family Medicine

## 2018-08-25 ENCOUNTER — Ambulatory Visit: Payer: PPO | Admitting: Sports Medicine

## 2018-08-25 VITALS — BP 110/64 | HR 74 | Temp 98.3°F | Ht 65.0 in | Wt 177.6 lb

## 2018-08-25 DIAGNOSIS — M25521 Pain in right elbow: Secondary | ICD-10-CM | POA: Diagnosis not present

## 2018-08-25 DIAGNOSIS — G4701 Insomnia due to medical condition: Secondary | ICD-10-CM | POA: Diagnosis not present

## 2018-08-25 DIAGNOSIS — M25511 Pain in right shoulder: Secondary | ICD-10-CM | POA: Diagnosis not present

## 2018-08-25 DIAGNOSIS — G8929 Other chronic pain: Secondary | ICD-10-CM

## 2018-08-25 MED ORDER — TRAZODONE HCL 50 MG PO TABS: 25.0000 mg | ORAL_TABLET | Freq: Every evening | ORAL | 3 refills | Status: DC | PRN

## 2018-08-25 MED ORDER — NITROGLYCERIN 0.2 MG/HR TD PT24: MEDICATED_PATCH | TRANSDERMAL | 1 refills | Status: DC

## 2018-08-25 NOTE — Progress Notes (Signed)
Mary Hernandez is a 67 y.o. female here for an acute visit.  History of Present Illness:   Lonell Grandchild, CMA acting as scribe for Dr. [name].   HPI: Patient in office for MRI results from Dr. Paulla Fore. She will be traveling to see grandchildren and would like to know her restrictions and plan of care going forward. Still with pain in right shoulder and elbow.  PMHx, SurgHx, SocialHx, Medications, and Allergies were reviewed in the Visit Navigator and updated as appropriate.  Current Medications:   .  acebutolol (SECTRAL) 200 MG capsule, Take 1 capsule (200 mg total) by mouth daily., Disp: 90 capsule, Rfl: 3 .  Calcium Citrate-Vitamin D 315-250 MG-UNIT TABS, Take by mouth., Disp: , Rfl:  .  cetirizine (ZYRTEC) 10 MG tablet, Take 10 mg by mouth daily., Disp: , Rfl:  .  EVENING PRIMROSE OIL PO, Take 1,500 mg by mouth 2 (two) times daily., Disp: , Rfl:  .  flecainide (TAMBOCOR) 50 MG tablet, Take 1.5 tablets (75 mg total) by mouth 2 (two) times daily., Disp: 270 tablet, Rfl: 3 .  fluticasone (FLONASE) 50 MCG/ACT nasal spray, Place 2 sprays into both nostrils daily., Disp: 16 g, Rfl: 6 .  Glucosamine-Chondroit-Vit C-Mn (GLUCOSAMINE 1500 COMPLEX) CAPS, Take by mouth., Disp: , Rfl:  .  glycopyrrolate (ROBINUL) 1 MG tablet, Take 1 tablet (1 mg total) by mouth 2 (two) times daily., Disp: 180 tablet, Rfl: 1 .  HYDROcodone-acetaminophen (NORCO/VICODIN) 5-325 MG tablet, Take 1 tablet by mouth every 6 (six) hours as needed for moderate pain., Disp: 5 tablet, Rfl: 0 .  levothyroxine (SYNTHROID, LEVOTHROID) 125 MCG tablet, Take 1 tablet (125 mcg total) by mouth daily before breakfast., Disp: 90 tablet, Rfl: 1 .  lidocaine (XYLOCAINE) 2 % jelly, Apply 1 application topically as needed., Disp: 30 mL, Rfl: 0 .  neomycin-polymyxin-hydrocortisone (CORTISPORIN) OTIC solution, Apply 1-2 drops to toe after soaking twice a day, Disp: 10 mL, Rfl: 0 .  nitroGLYCERIN (NITRODUR - DOSED IN MG/24 HR) 0.2 mg/hr  patch, Place 1/4 to 1/2 of a patch over affected region. Remove and replace once daily.  Slightly alter skin placement daily, Disp: 30 patch, Rfl: 1 .  omega-3 acid ethyl esters (LOVAZA) 1 g capsule, Take 1 g by mouth 2 (two) times daily., Disp: , Rfl:  .  ondansetron (ZOFRAN ODT) 8 MG disintegrating tablet, Take 1 tablet (8 mg total) by mouth every 8 (eight) hours as needed for nausea or vomiting., Disp: 20 tablet, Rfl: 0 .  potassium chloride (K-DUR,KLOR-CON) 10 MEQ tablet, Take 1 tablet (10 mEq total) by mouth 2 (two) times daily., Disp: 180 tablet, Rfl: 1 .  pravastatin (PRAVACHOL) 20 MG tablet, Take 1 tablet (20 mg total) by mouth daily., Disp: 90 tablet, Rfl: 1 .  Probiotic Product (PROBIOTIC-10 PO), Take by mouth., Disp: , Rfl:  .  Semaglutide (OZEMPIC) 1 MG/DOSE SOPN, Inject 1 mg into the skin once a week., Disp: 6 pen, Rfl: 1 .  traZODone (DESYREL) 50 MG tablet, Take 0.5-1 tablets (25-50 mg total) by mouth at bedtime as needed for sleep., Disp: 30 tablet, Rfl: 3 .  triamterene-hydrochlorothiazide (DYAZIDE) 37.5-25 MG capsule, Take 1 each (1 capsule total) by mouth daily. Must use brand name, Disp: 90 capsule, Rfl: 1 .  metoprolol tartrate (LOPRESSOR) 50 MG tablet, Take 1 tablet (50 mg total) by mouth once for 1 dose. Take one hour prior to your cardiac CT., Disp: 1 tablet, Rfl: 0   Allergies  Allergen Reactions  .  Adhesive [Tape] Rash   Review of Systems:   Pertinent items are noted in the HPI. Otherwise, ROS is negative.  Vitals:   Vitals:   08/25/18 1407  BP: 110/64  Pulse: 74  Temp: 98.3 F (36.8 C)  TempSrc: Oral  SpO2: 96%  Weight: 177 lb 9.6 oz (80.6 kg)  Height: 5\' 5"  (1.651 m)     Body mass index is 29.55 kg/m.  Physical Exam:   Physical Exam  Constitutional: She appears well-nourished.  HENT:  Head: Normocephalic and atraumatic.  Eyes: Pupils are equal, round, and reactive to light. EOM are normal.  Neck: Normal range of motion. Neck supple.    Cardiovascular: Normal rate, regular rhythm, normal heart sounds and intact distal pulses.  Pulmonary/Chest: Effort normal.  Abdominal: Soft.  Musculoskeletal:       Right shoulder: She exhibits decreased range of motion and bony tenderness.       Right elbow: She exhibits decreased range of motion. She exhibits no effusion. Tenderness found.  Skin: Skin is warm.  Psychiatric: She has a normal mood and affect. Her behavior is normal.  Nursing note and vitals reviewed.  Assessment and Plan:   Mary Hernandez was seen today for results.  Diagnoses and all orders for this visit:  Chronic right shoulder pain -     nitroGLYCERIN (NITRODUR - DOSED IN MG/24 HR) 0.2 mg/hr patch; Place 1/4 to 1/2 of a patch over affected region. Remove and replace once daily.  Slightly alter skin placement daily  Right elbow pain -     nitroGLYCERIN (NITRODUR - DOSED IN MG/24 HR) 0.2 mg/hr patch; Place 1/4 to 1/2 of a patch over affected region. Remove and replace once daily.  Slightly alter skin placement daily  Insomnia due to medical condition -     traZODone (DESYREL) 50 MG tablet; Take 0.5-1 tablets (25-50 mg total) by mouth at bedtime as needed for sleep.  Reviewed images with patient and her husband. Spoke with Dr. Paulla Fore via phone to discuss management going forward. See AVS for changes. Reviewed PT and surgery (as a last resort). She will fu with Dr. Paulla Fore when she gets back.  . Reviewed expectations re: course of current medical issues. . Discussed self-management of symptoms. . Outlined signs and symptoms indicating need for more acute intervention. . Patient verbalized understanding and all questions were answered. Marland Kitchen Health Maintenance issues including appropriate healthy diet, exercise, and smoking avoidance were discussed with patient. . See orders for this visit as documented in the electronic medical record. . Patient received an After Visit Summary.  CMA served as Education administrator during this visit. History,  Physical, and Plan performed by medical provider. The above documentation has been reviewed and is accurate and complete. [name], D.O.  [name], DO [place], [facility] 09/01/2018

## 2018-08-25 NOTE — Patient Instructions (Signed)
Take Tylenol 1000 mg three times daily (8 am, 1 pm, 8 pm). Take Trazodone at night. Stop the Elavil. You may use 1/2 nitroglycerin patch at shoulder and elbow.

## 2018-08-28 ENCOUNTER — Ambulatory Visit: Payer: Medicare PPO | Admitting: Family Medicine

## 2018-09-01 ENCOUNTER — Encounter: Payer: Self-pay | Admitting: Family Medicine

## 2018-09-11 ENCOUNTER — Encounter: Payer: Self-pay | Admitting: Sports Medicine

## 2018-09-11 ENCOUNTER — Ambulatory Visit (INDEPENDENT_AMBULATORY_CARE_PROVIDER_SITE_OTHER): Payer: PPO | Admitting: Sports Medicine

## 2018-09-11 VITALS — BP 102/62 | HR 74 | Ht 65.0 in | Wt 177.0 lb

## 2018-09-11 DIAGNOSIS — M7711 Lateral epicondylitis, right elbow: Secondary | ICD-10-CM

## 2018-09-11 DIAGNOSIS — M75101 Unspecified rotator cuff tear or rupture of right shoulder, not specified as traumatic: Secondary | ICD-10-CM | POA: Diagnosis not present

## 2018-09-11 DIAGNOSIS — M25511 Pain in right shoulder: Secondary | ICD-10-CM | POA: Diagnosis not present

## 2018-09-11 DIAGNOSIS — G8929 Other chronic pain: Secondary | ICD-10-CM | POA: Diagnosis not present

## 2018-09-11 DIAGNOSIS — Z8739 Personal history of other diseases of the musculoskeletal system and connective tissue: Secondary | ICD-10-CM | POA: Insufficient documentation

## 2018-09-11 MED ORDER — ZOLPIDEM TARTRATE 5 MG PO TABS: ORAL_TABLET | ORAL | 0 refills | Status: DC

## 2018-09-11 NOTE — Patient Instructions (Signed)
We are going to be setting you up to perform a PRP (platelet rich plasma) injection. The cost of this is $495 out of pocket.  We will plan to have you come back at your convenience.  You will need to be off of anti-inflammatories for approximately 2 weeks prior to the injection and continue to avoid these medications (ibuprofen, Aleeve(naproxen), Meloxicam(mobic), Voltaren(diclofenac) during the initial 6 weeks following your injection. It is okay to take Tylenol if needed.  Please go ahead and fill your tramadol prescription so you have it available for after the procedure.

## 2018-09-14 ENCOUNTER — Encounter: Payer: Self-pay | Admitting: Sports Medicine

## 2018-09-16 ENCOUNTER — Encounter: Payer: Self-pay | Admitting: Sports Medicine

## 2018-09-17 NOTE — Telephone Encounter (Signed)
Rx pending. Forwarding to Dr. Paulla Fore.

## 2018-09-20 ENCOUNTER — Encounter: Payer: Self-pay | Admitting: Sports Medicine

## 2018-09-21 MED ORDER — TRAMADOL HCL 50 MG PO TABS: 50.0000 mg | ORAL_TABLET | Freq: Four times a day (QID) | ORAL | 0 refills | Status: AC | PRN

## 2018-09-22 ENCOUNTER — Encounter: Payer: Self-pay | Admitting: Family Medicine

## 2018-10-01 MED ORDER — TRAMADOL HCL 50 MG PO TABS: 50.0000 mg | ORAL_TABLET | Freq: Four times a day (QID) | ORAL | 0 refills | Status: DC | PRN

## 2018-10-02 ENCOUNTER — Ambulatory Visit: Payer: Self-pay | Admitting: Sports Medicine

## 2018-10-02 ENCOUNTER — Other Ambulatory Visit: Payer: Self-pay

## 2018-10-02 ENCOUNTER — Ambulatory Visit: Payer: PPO | Admitting: Sports Medicine

## 2018-10-03 ENCOUNTER — Encounter: Payer: Self-pay | Admitting: Sports Medicine

## 2018-10-05 ENCOUNTER — Other Ambulatory Visit: Payer: Self-pay | Admitting: *Deleted

## 2018-10-05 ENCOUNTER — Ambulatory Visit: Payer: Self-pay

## 2018-10-05 ENCOUNTER — Ambulatory Visit: Payer: PPO | Admitting: Sports Medicine

## 2018-10-05 ENCOUNTER — Other Ambulatory Visit: Payer: Self-pay

## 2018-10-05 DIAGNOSIS — G8929 Other chronic pain: Secondary | ICD-10-CM

## 2018-10-05 DIAGNOSIS — M25521 Pain in right elbow: Secondary | ICD-10-CM

## 2018-10-05 DIAGNOSIS — M7711 Lateral epicondylitis, right elbow: Secondary | ICD-10-CM

## 2018-10-05 DIAGNOSIS — M25511 Pain in right shoulder: Secondary | ICD-10-CM

## 2018-10-05 DIAGNOSIS — M75101 Unspecified rotator cuff tear or rupture of right shoulder, not specified as traumatic: Secondary | ICD-10-CM

## 2018-10-05 MED ORDER — DIAZEPAM 10 MG PO TABS: ORAL_TABLET | ORAL | 0 refills | Status: DC

## 2018-10-05 MED ORDER — OXYCODONE-ACETAMINOPHEN 5-325 MG PO TABS: 1.0000 | ORAL_TABLET | Freq: Four times a day (QID) | ORAL | 0 refills | Status: DC | PRN

## 2018-10-05 MED FILL — DIAZEPAM 10 MG TABS: 10 | 1 days supply | Qty: 1 | Fill #0

## 2018-10-05 NOTE — Telephone Encounter (Signed)
Per Dr. Paulla Fore, [place] to send in [place]. Forwarding to Dr. Paulla Fore.

## 2018-10-05 NOTE — Telephone Encounter (Signed)
Rx sent in. Take with zofran

## 2018-10-05 NOTE — Addendum Note (Signed)
Addended by: Jasper Loser on: 10/05/2018 12:32 PM   Modules accepted: Orders

## 2018-10-05 NOTE — Addendum Note (Signed)
Addended by: Teresa Coombs D on: 10/05/2018 12:55 PM   Modules accepted: Orders

## 2018-10-05 NOTE — Telephone Encounter (Signed)
Spoke with pt and advised that Percocet has been sent in to [facility]. Per Dr. Paulla Fore, [place] to take when she is able to pick up from pharmacy, even after taking Tramadol 2 hours ago. Do not take Tramadol while taking Percocet. Take Zofran prior to taking 1st Percocet and then a directed on the bottle. Pt already had Zofran so no additional rx was sent in for Zofran. Pt verbalized understanding.

## 2018-10-05 NOTE — Telephone Encounter (Signed)
Pt and husband called. Pt has had a procedure today by Dr Paulla Fore and states after taking all prescribed medication she is in extreme pain.  Her pain is in the right elbow and rt shoulder. Pt demands to speak now to the doctor or nurse that was part of the procedure.  Brandy at the office was contacted and pt was transferred to office as requested. No triage was done.  Answer Assessment - Initial Assessment Questions 1. ONSET: "When did the pain start?"     today 2. LOCATION: "Where is the pain located?"     *No Answer* 3. PAIN: "How bad is the pain?" (Scale 1-10; or mild, moderate, severe)   - MILD (1-3): doesn't interfere with normal activities   - MODERATE (4-7): interferes with normal activities (e.g., work or school) or awakens from sleep   - SEVERE (8-10): excruciating pain, unable to do any normal activities, unable to hold a cup of water     *No Answer* 4. WORK OR EXERCISE: "Has there been any recent work or exercise that involved this part of the body?"     *No Answer* 5. CAUSE: "What do you think is causing the arm pain?"     *No Answer* 6. OTHER SYMPTOMS: "Do you have any other symptoms?" (e.g., neck pain, swelling, rash, fever, numbness, weakness)     *No Answer* 7. PREGNANCY: "Is there any chance you are pregnant?" "When was your last menstrual period?"     *No Answer*  Protocols used: ARM PAIN-A-AH

## 2018-10-05 NOTE — Telephone Encounter (Signed)
Returned call left VM to call office

## 2018-10-05 NOTE — Telephone Encounter (Signed)
Called pt and left VM to call the office.  

## 2018-10-05 NOTE — Telephone Encounter (Signed)
See second triage encounter. 

## 2018-10-05 NOTE — Procedures (Signed)
  Mary Hernandez. , [facility] [place]  W[place] - 67 y.o. female MRN [id number]  Date of birth: December 27, 1950  Visit Date: 10/05/2018  PCP: [name], DO   Referred by: [name], DO  PRP Visit & Procedure Note  SUBJECTIVE:  Joelys Staubs is here for a procedure only visit for PRP (Platelet Rich Plasma).    Pt reports no significant changes in her general health or in her day to day symptoms for the condition(s) that are being treated today.   They deny any fevers, chills, or night sweats.   OBJECTIVE:  PHYSICAL EXAM: Please see previous exam notes for full joint and extremities No overlying skin changes appreciated.  MSK Exam: She has focal pain over the lateral epicondyle.  Rotator cuff strength is intact but slightly painful with supraspinatus testing.  ASSESSMENT   1. Chronic right shoulder pain   2. Right elbow pain   3. Lateral epicondylitis of right elbow   4. Rotator cuff syndrome of right shoulder     PLAN:   PROCEDURE NOTE:  Ultrasound Guided: PRP Injection: Right rotator cuff interval and lateral epicondyle common extensor tendon  DESCRIPTION OF PROCEDURE:  The patient's clinical condition is marked by substantial pain and/or significant functional disability. Other conservative therapy has not provided relief, is contraindicated, or not appropriate. There is a reasonable likelihood that injection will significantly improve the patient's pain and/or functional impairment.   After discussing the risks, benefits and expected outcomes of the injection and all questions were reviewed and answered, the patient wished to undergo the above named procedure.  Verbal consent was obtained. The skin was then prepped in sterile fashion and the target structure was injected as below:  Both of the above regions were injected in similar fashion as below.  PREP: Alcohol, Ethel Chloride and 3 cc 1% lidocaine  on 25g 1.5 in. needle APPROACH: direct, single injection, 21g 2 in. INJECTATE: 2 cc at each site using PRP harvested with PEAK PRP System per manufactures recommendations. DRESSING: Band-Aid  Post procedural instructions including recommending icing and warning signs for infection were reviewed.    This procedure was well tolerated and there were no complications.  Ultimately we discussed the appropriate healing time and healing components with PRP and she is to expect an acute flare in her pain over the next several days to weeks.  Arm immobilizer provided today.  Prescription for tramadol initially provided but once arriving home she did request a slightly stronger medicine of Percocet was sent in.  She should take this with her Zofran to prevent GI/nausea/vomiting.   Return in about 2 weeks (around 10/19/2018).        Gerda Diss, [facility] Physician

## 2018-10-05 NOTE — Telephone Encounter (Signed)
Spoke with Dr. Paulla Fore and pt. Called in Diazepam 10 mg to take 1 tablet prior to procedure. Rx called in to [facility], pt aware.

## 2018-10-05 NOTE — Telephone Encounter (Signed)
Summary: advise for pain relief    Pt husband called in wanting to speak with Dr. Nicolasa Ducking nurse stating that Pt took meds prescribed and had no relief from pain and then took a tylenol and still no relief. Pt is in severe pain and needs to know what to do or if there is any meds that are stronger to help with the relief of her pain from the procedure she had done this morning. Husband asked if they can be called before lunch      Left message to call back

## 2018-10-06 ENCOUNTER — Other Ambulatory Visit: Payer: Self-pay

## 2018-10-06 MED ORDER — PROMETHAZINE HCL 25 MG RE SUPP: 25.0000 mg | RECTAL | 1 refills | Status: DC | PRN

## 2018-10-06 NOTE — Addendum Note (Signed)
Addended by: Jasper Loser on: 10/06/2018 12:06 PM   Modules accepted: Orders

## 2018-10-06 NOTE — Telephone Encounter (Signed)
Gene called the office and left VM to advise that pt has been taking Percocet q4h and using Zofran with no relief. They have Phenergan suppositories but they are out of date. He requested a call back because he had questions/concerns.   Spoke with Gene, pt took Percocet at 8:00 PM, 12:15 AM, and 4:00 AM. She had a lot of nausea in the morning and vomited. She took ODT Zofran and vomited again. At 10:45 AM she used an expired phenergan suppository and got some relief. She has not taken a Percocet since 4:00 AM. She has been drowsy and sleeping since using the phenergan. Gene also advised that she had been taking Tylenol 500 mg, 2 tablets, between doses of Percocet. He feels that her nausea may be related to her vertigo, as she feels nauseated when sitting up or standing.   Made Dr. Paulla Fore verbally aware. Per Dr. Paulla Fore, advised Gene that pt should take Percocet ONLY every 6 hours for pain. She may take 1 Tylenol 500 mg 3 hours after taking Percocet if needed. Advised that she should take the meds for pain on regular schedule to help get ahead of the pain. Will send in new rx for Phenergan 25 mg suppository to use q4h prn for nausea. Pt should stay hydrated and rest. May sleep in sling using abd strap to keep arm close to the body if that feels better. Advised Gene that the office is closing for the day soon and will also be closed tomorrow. There will be a provider on call if he has any questions/concerns while the office is closed. Gene verbalized understanding.

## 2018-10-11 NOTE — Progress Notes (Signed)
Mary Hernandez is a 68 y.o. female is here for follow up.  History of Present Illness:   HPI:   1. Acquired hypothyroidism.   Current symptoms: none.   Lab Results  Component Value Date   TSH 1.51 10/12/2018   TSH 1.08 05/01/2018   TSH 0.49 11/27/2017   Lab Results  Component Value Date   FREET4 0.98 10/12/2018   FREET4 1.09 05/01/2018       2. Insulin resistance. Medication compliance: compliant all of the time, diabetic diet compliance: compliant most of the time, home glucose monitoring: is performed sporadically, further diabetic ROS: no polyuria or polydipsia, no chest pain, dyspnea or TIA's, no numbness, tingling or pain in extremities.    3. Pure hypercholesterolemia.  Trying to exercise on a regular basis? No. Compliant with diet? Yes.  Lab Results  Component Value Date   CHOL 178 07/10/2018   HDL 64.40 07/10/2018   LDLCALC 76 07/10/2018   LDLDIRECT 94.0 05/01/2018   TRIG 187.0 (H) 07/10/2018   CHOLHDL 3 07/10/2018   Lab Results  Component Value Date   ALT 46 (H) 10/12/2018   AST 33 10/12/2018   ALKPHOS 89 10/12/2018   BILITOT 0.4 10/12/2018       4. Essential hypertension.   Review: taking medications as instructed, no medication side effects noted, no TIAs, no chest pain on exertion, no dyspnea on exertion, no swelling of ankles. Smoker: No. BP Readings from Last 3 Encounters:  10/12/18 122/70  10/12/18 122/70  09/11/18 102/62   Lab Results  Component Value Date   CREATININE 0.63 10/12/2018   CREATININE 0.63 07/02/2018   CREATININE 0.71 05/01/2018        There are no preventive care reminders to display for this patient.   Depression screen [facility] 2/9 10/12/2018 02/25/2018  Decreased Interest 0 0  Down, Depressed, Hopeless 0 0  PHQ - 2 Score 0 0   PMHx, SurgHx, SocialHx, FamHx, Medications, and Allergies were reviewed in the Visit Navigator and updated as appropriate.   Patient Active Problem List   Diagnosis Date Noted  . Insomnia due to  medical condition 10/18/2018  . Elevated ALT measurement 10/18/2018  . Insulin resistance 10/12/2018  . Chronic right shoulder pain 09/11/2018  . Myofascial pain syndrome 07/17/2018  . Lateral epicondylitis of right elbow 06/25/2018  . Radiculitis 05/25/2018  . Arthritis of right hip 03/30/2018  . Breast nodule 03/30/2018  . Diverticulosis 03/30/2018  . GERD (gastroesophageal reflux disease) 03/30/2018  . Nonerosive nonspecific gastritis 03/30/2018  . Hypertension 03/01/2018  . Hyperlipidemia 03/01/2018  . Acquired hypothyroidism 03/01/2018  . Vertigo 03/01/2018  . Frequent PVCs 03/01/2018   Social History   Tobacco Use  . Smoking status: Never Smoker  . Smokeless tobacco: Never Used  Substance Use Topics  . Alcohol use: Yes    Comment: very limited   . Drug use: Never   Current Medications and Allergies:   .  acebutolol (SECTRAL) 200 MG capsule, Take 1 capsule (200 mg total) by mouth daily., Disp: 90 capsule, Rfl: 3 .  Calcium Citrate-Vitamin D 315-250 MG-UNIT TABS, Take by mouth., Disp: , Rfl:  .  cetirizine (ZYRTEC) 10 MG tablet, Take 10 mg by mouth daily., Disp: , Rfl:  .  diazepam (VALIUM) 10 MG tablet, Take 1 tablet prior to procedure, Disp: 1 tablet, Rfl: 0 .  EVENING PRIMROSE OIL PO, Take 1,500 mg by mouth 2 (two) times daily., Disp: , Rfl:  .  flecainide (TAMBOCOR) 50 MG tablet,  Take 1.5 tablets (75 mg total) by mouth 2 (two) times daily., Disp: 270 tablet, Rfl: 3 .  fluticasone (FLONASE) 50 MCG/ACT nasal spray, Place 2 sprays into both nostrils daily., Disp: 16 g, Rfl: 6 .  Glucosamine-Chondroit-Vit C-Mn (GLUCOSAMINE 1500 COMPLEX) CAPS, Take by mouth., Disp: , Rfl:  .  glycopyrrolate (ROBINUL) 1 MG tablet, Take 1 tablet (1 mg total) by mouth 2 (two) times daily., Disp: 180 tablet, Rfl: 1 .  HYDROcodone-acetaminophen (NORCO/VICODIN) 5-325 MG tablet, Take 1 tablet by mouth every 6 (six) hours as needed for moderate pain., Disp: 5 tablet, Rfl: 0 .  levothyroxine  (SYNTHROID, LEVOTHROID) 125 MCG tablet, Take 1 tablet (125 mcg total) by mouth daily before breakfast., Disp: 90 tablet, Rfl: 1 .  lidocaine (XYLOCAINE) 2 % jelly, Apply 1 application topically as needed., Disp: 30 mL, Rfl: 0 .  neomycin-polymyxin-hydrocortisone (CORTISPORIN) OTIC solution, Apply 1-2 drops to toe after soaking twice a day, Disp: 10 mL, Rfl: 0 .  nitroGLYCERIN (NITRODUR - DOSED IN MG/24 HR) 0.2 mg/hr patch, Place 1/4 to 1/2 of a patch over affected region. Remove and replace once daily.  Slightly alter skin placement daily, Disp: 30 patch, Rfl: 1 .  omega-3 acid ethyl esters (LOVAZA) 1 g capsule, Take 1 g by mouth 2 (two) times daily., Disp: , Rfl:  .  oxyCODONE-acetaminophen (PERCOCET) 5-325 MG tablet, Take 1 tablet by mouth every 6 (six) hours as needed for severe pain., Disp: 15 tablet, Rfl: 0 .  potassium chloride (K-DUR,KLOR-CON) 10 MEQ tablet, Take 1 tablet (10 mEq total) by mouth 2 (two) times daily., Disp: 180 tablet, Rfl: 1 .  pravastatin (PRAVACHOL) 20 MG tablet, Take 1 tablet (20 mg total) by mouth daily., Disp: 90 tablet, Rfl: 1 .  Probiotic Product (PROBIOTIC-10 PO), Take by mouth., Disp: , Rfl:  .  promethazine (PHENERGAN) 25 MG suppository, Place 1 suppository (25 mg total) rectally every 4 (four) hours as needed for nausea or vomiting., Disp: 12 each, Rfl: 1 .  Semaglutide (OZEMPIC) 1 MG/DOSE SOPN, Inject 1 mg into the skin once a week., Disp: 6 pen, Rfl: 1 .  traMADol (ULTRAM) 50 MG tablet, Take 1 tablet (50 mg total) by mouth every 6 (six) hours as needed., Disp: 20 tablet, Rfl: 0 .  traZODone (DESYREL) 50 MG tablet, Take 0.5-1 tablets (25-50 mg total) by mouth at bedtime as needed for sleep., Disp: 30 tablet, Rfl: 3 .  triamterene-hydrochlorothiazide (DYAZIDE) 37.5-25 MG capsule, Take 1 each (1 capsule total) by mouth daily. Must use brand name, Disp: 90 capsule, Rfl: 1   Allergies  Allergen Reactions  . Adhesive [Tape] Rash   Review of Systems   Pertinent  items are noted in the HPI. Otherwise, a complete ROS is negative.  Vitals:   Vitals:   10/12/18 0901  BP: 122/70  Pulse: (!) 56  Temp: 98.5 F (36.9 C)  TempSrc: Oral  SpO2: 94%  Weight: 179 lb 9.6 oz (81.5 kg)  Height: 5\' 5"  (1.651 m)     Body mass index is 29.89 kg/m.  Physical Exam:   Physical Exam Vitals signs and nursing note reviewed.  HENT:     Head: Normocephalic and atraumatic.  Eyes:     Pupils: Pupils are equal, round, and reactive to light.  Neck:     Musculoskeletal: Normal range of motion and neck supple.  Cardiovascular:     Rate and Rhythm: Normal rate and regular rhythm.     Heart sounds: Normal heart sounds.  Pulmonary:  Effort: Pulmonary effort is normal.  Abdominal:     Palpations: Abdomen is soft.  Musculoskeletal:     Right shoulder: She exhibits decreased range of motion and bony tenderness.     Right elbow: She exhibits decreased range of motion. She exhibits no effusion. Tenderness found.  Skin:    General: Skin is warm.  Psychiatric:        Behavior: Behavior normal.    Assessment and Plan:   Tymeshia was seen today for follow-up.  Diagnoses and all orders for this visit:  Acquired hypothyroidism -     TSH -     T4, free  Insulin resistance -     Hemoglobin A1c -     Semaglutide, 1 MG/DOSE, (OZEMPIC, 1 MG/DOSE,) 2 MG/1.5ML SOPN; Inject 1 mg into the skin once a week.  Pure hypercholesterolemia -     Comprehensive metabolic panel  Essential hypertension  Insomnia due to medical condition  Chronic right shoulder pain -     gabapentin (NEURONTIN) 100 MG capsule; Take 1 capsule (100 mg total) by mouth 3 (three) times daily. -     celecoxib (CELEBREX) 100 MG capsule; Take 1 capsule (100 mg total) by mouth 2 (two) times daily. -     oxyCODONE-acetaminophen (PERCOCET/ROXICET) 5-325 MG tablet; Take 1-2 tablets by mouth every 4 (four) hours as needed for up to 7 days for severe pain.  Nonerosive nonspecific gastritis -      omeprazole (PRILOSEC) 40 MG capsule; Take 1 capsule (40 mg total) by mouth daily.  Elevated ALT measurement -     US ABDOMEN LIMITED RUQ; Future   . Orders and follow up as documented in [place], reviewed diet, exercise and weight control, cardiovascular risk and specific lipid/LDL goals reviewed, reviewed medications and side effects in detail.  . Reviewed expectations re: course of current medical issues. . Outlined signs and symptoms indicating need for more acute intervention. . Patient verbalized understanding and all questions were answered. . Patient received an After Visit Summary.  [name], DO [place], [facility] 10/18/2018

## 2018-10-12 ENCOUNTER — Encounter: Payer: Self-pay | Admitting: Sports Medicine

## 2018-10-12 ENCOUNTER — Ambulatory Visit (INDEPENDENT_AMBULATORY_CARE_PROVIDER_SITE_OTHER): Payer: PPO | Admitting: Sports Medicine

## 2018-10-12 ENCOUNTER — Encounter: Payer: Self-pay | Admitting: Family Medicine

## 2018-10-12 ENCOUNTER — Ambulatory Visit (INDEPENDENT_AMBULATORY_CARE_PROVIDER_SITE_OTHER): Payer: PPO | Admitting: Family Medicine

## 2018-10-12 VITALS — BP 122/70 | HR 56 | Ht 65.0 in | Wt 179.0 lb

## 2018-10-12 VITALS — BP 122/70 | HR 56 | Temp 98.5°F | Ht 65.0 in | Wt 179.6 lb

## 2018-10-12 DIAGNOSIS — E7800 Pure hypercholesterolemia, unspecified: Secondary | ICD-10-CM

## 2018-10-12 DIAGNOSIS — R7303 Prediabetes: Secondary | ICD-10-CM | POA: Insufficient documentation

## 2018-10-12 DIAGNOSIS — M7711 Lateral epicondylitis, right elbow: Secondary | ICD-10-CM

## 2018-10-12 DIAGNOSIS — M25521 Pain in right elbow: Secondary | ICD-10-CM

## 2018-10-12 DIAGNOSIS — E8881 Metabolic syndrome: Secondary | ICD-10-CM | POA: Diagnosis not present

## 2018-10-12 DIAGNOSIS — G8929 Other chronic pain: Secondary | ICD-10-CM | POA: Diagnosis not present

## 2018-10-12 DIAGNOSIS — I10 Essential (primary) hypertension: Secondary | ICD-10-CM | POA: Diagnosis not present

## 2018-10-12 DIAGNOSIS — K2960 Other gastritis without bleeding: Secondary | ICD-10-CM

## 2018-10-12 DIAGNOSIS — M25511 Pain in right shoulder: Secondary | ICD-10-CM

## 2018-10-12 DIAGNOSIS — R740 Nonspecific elevation of levels of transaminase and lactic acid dehydrogenase [LDH]: Secondary | ICD-10-CM | POA: Diagnosis not present

## 2018-10-12 DIAGNOSIS — M75101 Unspecified rotator cuff tear or rupture of right shoulder, not specified as traumatic: Secondary | ICD-10-CM

## 2018-10-12 DIAGNOSIS — E88819 Insulin resistance, unspecified: Secondary | ICD-10-CM

## 2018-10-12 DIAGNOSIS — E039 Hypothyroidism, unspecified: Secondary | ICD-10-CM | POA: Diagnosis not present

## 2018-10-12 DIAGNOSIS — G4701 Insomnia due to medical condition: Secondary | ICD-10-CM

## 2018-10-12 DIAGNOSIS — R7401 Elevation of levels of liver transaminase levels: Secondary | ICD-10-CM

## 2018-10-12 LAB — COMPREHENSIVE METABOLIC PANEL
ALT: 46 U/L — ABNORMAL HIGH (ref 0–35)
AST: 33 U/L (ref 0–37)
Albumin: 4.8 g/dL (ref 3.5–5.2)
Alkaline Phosphatase: 89 U/L (ref 39–117)
BUN: 12 mg/dL (ref 6–23)
CO2: 29 mEq/L (ref 19–32)
Calcium: 9.4 mg/dL (ref 8.4–10.5)
Chloride: 98 mEq/L (ref 96–112)
Creatinine, Ser: 0.63 mg/dL (ref 0.40–1.20)
GFR: 100.39 mL/min (ref >60.00)
Glucose, Bld: 78 mg/dL (ref 70–99)
Potassium: 3.5 mEq/L (ref 3.5–5.1)
Sodium: 139 mEq/L (ref 135–145)
Total Bilirubin: 0.4 mg/dL (ref 0.2–1.2)
Total Protein: 7.5 g/dL (ref 6.0–8.3)

## 2018-10-12 LAB — TSH: TSH: 1.51 u[IU]/mL (ref 0.35–4.50)

## 2018-10-12 LAB — T4, FREE: Free T4: 0.98 ng/dL (ref 0.60–1.60)

## 2018-10-12 LAB — HEMOGLOBIN A1C: Hgb A1c MFr Bld: 5.9 % (ref 4.6–6.5)

## 2018-10-12 MED ORDER — GABAPENTIN 100 MG PO CAPS: 100.0000 mg | ORAL_CAPSULE | Freq: Three times a day (TID) | ORAL | 3 refills | Status: DC

## 2018-10-12 MED ORDER — HYDROCORTISONE 2.5 % RE CREA: 1.0000 "application " | TOPICAL_CREAM | Freq: Two times a day (BID) | RECTAL | 3 refills | Status: DC

## 2018-10-12 MED ORDER — OMEPRAZOLE 40 MG PO CPDR: 40.0000 mg | DELAYED_RELEASE_CAPSULE | Freq: Every day | ORAL | 3 refills | Status: DC

## 2018-10-12 MED ORDER — SEMAGLUTIDE (1 MG/DOSE) 2 MG/1.5ML ~~LOC~~ SOPN: 1.0000 mg | PEN_INJECTOR | SUBCUTANEOUS | 1 refills | Status: DC

## 2018-10-12 MED ORDER — OXYCODONE-ACETAMINOPHEN 5-325 MG PO TABS: 1.0000 | ORAL_TABLET | ORAL | 0 refills | Status: DC | PRN

## 2018-10-12 MED ORDER — CELECOXIB 100 MG PO CAPS: 100.0000 mg | ORAL_CAPSULE | Freq: Two times a day (BID) | ORAL | 3 refills | Status: DC

## 2018-10-12 NOTE — Telephone Encounter (Signed)
Mr. Boyack calling, yesterday the pain continued to increase appreciably in her right arm until it was really bad. She tried her medication, on schedule, last night as she went to bed. About 4:30 this morning she woke up and it was really bad, she took some Tylenol then. She couldn't wait any longer to take her other medication which was due at 8, she took it at 6:00. She has her 6 mo f/u this morning with Dr. Juleen China at 9:20. She needs to be worked in with Dr. Paulla Fore before then or after then to tell them what time they can come in to see Dr. Paulla Fore. The pain was worse this morning than it has ever been.   Pt calling back again to make sure we got the earlier message. She reports that she is in excruciating unbearable pain and needs to be worked in this morning to see Dr. Paulla Fore.

## 2018-10-12 NOTE — Telephone Encounter (Signed)
Spoke with pt prior to her appointment with Dr. Juleen China this morning. Dr. Juleen China consulted with Dr. Paulla Fore and she will offer treatments options during their visit. If pt still wishes to see Dr. Paulla Fore after seeing Dr. Juleen China, we will work her in on Dr. Nicolasa Ducking schedule at that time.

## 2018-10-12 NOTE — Progress Notes (Signed)
Patient comes in today for follow-up after PRP injection last week. She had an acute exacerbation of pain last night after going for a 20-minute walk without a shoulder immobilizer on. She is also been performing some home therapeutic exercises including internal and external rotation exercises.  She reports some stiffness with wearing the arm immobilizer and has discontinued at this time.  Patient is sitting comfortably and appears in no acute distress. She reports her pain is severe at this time. Her injection site is slightly bruised but no signs of infection. She is able to externally rotate and internally rotate with pain but strength is intact. Empty can testing has a moderate amount of pain but this is minimal.  Assessment: Status post PRP injection to the right elbow and shoulder  Plan We did discuss that this really should be something she should be almost immobilizing at this point as we previously instructed. She has received refills from Dr. Juleen China including Percocet, gabapentin, trazodone, omeprazole and does have a prescription for Celebrex. We discussed that using Celebrex on a regular basis is not recommended but if she has an acute flareup it is acceptable to do this. She should avoid any type of motion with her shoulder and elbow at this time and the exercises she was performing should not be continued at this time. We also recommended that if she is ambulatory at all that she wear the shoulder immobilizer as a suspect the acute flareup she had yesterday was secondary to overdoing it and having gravity effect pulling on the shoulder and elbow.  She has a follow-up scheduled already for next week and we will plan to see her back at that time. Refills have been provided per Dr. Juleen China and I appreciate her assistance with this.

## 2018-10-14 ENCOUNTER — Encounter: Payer: Self-pay | Admitting: Family Medicine

## 2018-10-15 ENCOUNTER — Encounter: Payer: Self-pay | Admitting: Family Medicine

## 2018-10-15 DIAGNOSIS — G8929 Other chronic pain: Secondary | ICD-10-CM

## 2018-10-15 DIAGNOSIS — M25511 Pain in right shoulder: Principal | ICD-10-CM

## 2018-10-15 NOTE — Telephone Encounter (Signed)
See note

## 2018-10-15 NOTE — Telephone Encounter (Signed)
Have addressed in another message to patient

## 2018-10-15 NOTE — Progress Notes (Signed)
Pt calling to state that she would ike this med in by tomorrow morning if possible.

## 2018-10-16 ENCOUNTER — Telehealth: Payer: Self-pay | Admitting: Family Medicine

## 2018-10-16 ENCOUNTER — Encounter: Payer: Self-pay | Admitting: Family Medicine

## 2018-10-16 ENCOUNTER — Telehealth: Payer: Self-pay | Admitting: Radiology

## 2018-10-16 MED ORDER — OXYCODONE-ACETAMINOPHEN 5-325 MG PO TABS: 1.0000 | ORAL_TABLET | ORAL | 0 refills | Status: AC | PRN

## 2018-10-16 NOTE — Telephone Encounter (Signed)
My chart message sent to make sure she had picked up.

## 2018-10-16 NOTE — Telephone Encounter (Signed)
Spoke with Dr. Juleen China, she advised OK to have rx refilled. [place] and advised. Sent MyChart message to pt advising.

## 2018-10-16 NOTE — Telephone Encounter (Signed)
Copied from [place] [id number]. Topic: Quick Communication - See Telephone Encounter >> Oct 16, 2018 12:31 PM Percell Belt A wrote: CRM for notification. See Telephone encounter for: 10/16/18.   Pt called in and stated that the pharmacy told her that she is not able to fill the oxyCODONE-acetaminophen (PERCOCET/ROXICET) 5-325 MG tablet [[id number]] today due to it saying something about the 7 days.  She is in a panic and said that she totally out and is due for another at 1pm and is panicking because she may not get to take it on time.  She asked that I walk back to nurse right now and tell jolleen that she was not able to fill it and to call it in as quick as she can so she can make sure she gets it at 1 pm.  I explain I was going to send a message to let nurse know what happened.  She stated that she wanted me to promise that someone would call her right after 1 to let her know that this has been handled.  She Stated she did not want to keep calling back to check on the status.

## 2018-10-16 NOTE — Telephone Encounter (Signed)
I believe that Theadora Rama took care of this? Please confirm. I think that they needed verbal confirmation?

## 2018-10-16 NOTE — Telephone Encounter (Signed)
Copied from [place] [id number]. Topic: General - Other >> Oct 16, 2018 12:34 PM Leward Quan A wrote: Reason for CRM: Amy with [place] called to say that Pt received an Rx on 10/12/2018 for oxyCODONE-acetaminophen (PERCOCET/ROXICET) 5-325 MG tablet and another Rx was received today for same med in the same quantity for 7 days. She is needing to know if its ok to go ahead and fill this today. Please call pharmacy to clarify Ph# [phone]

## 2018-10-16 NOTE — Telephone Encounter (Signed)
Please advise 

## 2018-10-18 ENCOUNTER — Encounter: Payer: Self-pay | Admitting: Family Medicine

## 2018-10-18 DIAGNOSIS — R740 Nonspecific elevation of levels of transaminase and lactic acid dehydrogenase [LDH]: Secondary | ICD-10-CM

## 2018-10-18 DIAGNOSIS — G4701 Insomnia due to medical condition: Secondary | ICD-10-CM | POA: Insufficient documentation

## 2018-10-18 DIAGNOSIS — R7401 Elevation of levels of liver transaminase levels: Secondary | ICD-10-CM | POA: Insufficient documentation

## 2018-10-20 ENCOUNTER — Ambulatory Visit (INDEPENDENT_AMBULATORY_CARE_PROVIDER_SITE_OTHER): Payer: PPO | Admitting: Sports Medicine

## 2018-10-20 ENCOUNTER — Encounter: Payer: Self-pay | Admitting: Sports Medicine

## 2018-10-20 VITALS — BP 120/80 | HR 72 | Ht 65.0 in | Wt 175.2 lb

## 2018-10-20 DIAGNOSIS — M75101 Unspecified rotator cuff tear or rupture of right shoulder, not specified as traumatic: Secondary | ICD-10-CM

## 2018-10-20 DIAGNOSIS — M25521 Pain in right elbow: Secondary | ICD-10-CM | POA: Diagnosis not present

## 2018-10-20 DIAGNOSIS — M7711 Lateral epicondylitis, right elbow: Secondary | ICD-10-CM | POA: Diagnosis not present

## 2018-10-20 DIAGNOSIS — G8929 Other chronic pain: Secondary | ICD-10-CM | POA: Diagnosis not present

## 2018-10-20 DIAGNOSIS — M25511 Pain in right shoulder: Secondary | ICD-10-CM

## 2018-10-20 NOTE — Progress Notes (Signed)
Mary Hernandez, Mary Hernandez at [place]  Gizel Riedlinger - 68 y.o. female MRN [id number]  Date of birth: [birth year]  Visit Date: October 20, 2018  PCP: [name], DO   Referred by: [name], DO  SUBJECTIVE:  Chief Complaint  Patient presents with  . f/u R shoulder    Received PRP injection 10/05/2018, did not tolerate well. Taking Percocet, last dose this past Sunday. Taking Tylenol prn. Using sling.   . f/u R elbow    PRP injection 10/05/18.     HPI: She is here and reports overall doing better over the last several days.  She has been able to wean off of the Percocet has not needed any over the past 3 days.  She is also been able to sleep through the night for the past 2 nights.  She denies any fevers, chills or night sweats.  She does continue to have some GI irritation and is eating a bland diet due to the side effects from the medications.  She is using gabapentin and trazodone intermittently.  She has not been using Celebrex but is taking Tylenol at night.  She is using the arm sling on a regular basis.  Not performing any significant therapeutic exercises as requested.  REVIEW OF SYSTEMS: Per HPI  HISTORY:  Prior history reviewed and updated per electronic medical record.  Social History   Occupational History  . Occupation: Retired  Tobacco Use  . Smoking status: Never Smoker  . Smokeless tobacco: Never Used  Substance and Sexual Activity  . Alcohol use: Yes    Comment: very limited   . Drug use: Never  . Sexual activity: Not on file   Social History   Social History Narrative   Retired Licensed conveyancer   Born [place] up in [place] in [place] for 69 years   Moved to [place] in 2019   Twin daughters      OBJECTIVE:  VS:  HT:5\' 5"  (165.1 cm)   WT:175 lb 3.2 oz (79.5 kg)  BMI:29.15    BP:120/80  HR:72bpm  TEMP: ( )  RESP:97 %   PHYSICAL EXAM: Adult female.   No acute distress.  Alert and appropriate. Her right shoulder and elbow appear normal although focally tender over the lateral shoulder and over the lateral epicondyle.  She does have tenderness within the common extensor tendon but has good wrist flexion and extension.  Rotator cuff strength is intact with manual muscle testing although painful with resisted shoulder abduction.   ASSESSMENT   1. Chronic right shoulder pain   2. Right elbow pain   3. Lateral epicondylitis of right elbow   4. Rotator cuff syndrome of right shoulder      PROCEDURES:  None  PLAN:  Pertinent additional documentation may be included in corresponding procedure notes, imaging studies, problem based documentation and patient instructions.  No problem-specific Assessment & Plan notes found for this encounter.  She is continuing to improve following the PRP injection 2 weeks ago.  Ultimately she should continue trying to avoid exacerbating activities but will have to have her begin working on Codman exercises that were reviewed personally with her today.  In 2 weeks we will plan to repeat ultrasound at that time and likely restart nitroglycerin protocol then but at this time we will have her continue to hold off on dual therapy with PRP and nitroglycerin.  Activity  modifications and the importance of avoiding exacerbating activities (limiting pain to no more than a 4 / 10 during or following activity) recommended and discussed. Discussed red flag symptoms that warrant earlier emergent evaluation and patient voices understanding.  At follow up will plan: repeat [facility] Ultrasound Return in about 2 weeks (around 11/03/2018).          Gerda Diss, [facility] Physician

## 2018-10-21 ENCOUNTER — Encounter: Payer: Self-pay | Admitting: Sports Medicine

## 2018-10-26 ENCOUNTER — Encounter: Payer: Self-pay | Admitting: Family Medicine

## 2018-10-26 ENCOUNTER — Ambulatory Visit
Admission: RE | Admit: 2018-10-26 | Discharge: 2018-10-26 | Disposition: A | Discharge status: Home / Self Care | Payer: PPO | Source: Ambulatory Visit | Attending: Family Medicine | Admitting: Family Medicine

## 2018-10-26 DIAGNOSIS — K760 Fatty (change of) liver, not elsewhere classified: Secondary | ICD-10-CM | POA: Diagnosis not present

## 2018-10-26 DIAGNOSIS — R7401 Elevation of levels of liver transaminase levels: Secondary | ICD-10-CM

## 2018-10-26 DIAGNOSIS — R740 Nonspecific elevation of levels of transaminase and lactic acid dehydrogenase [LDH]: Principal | ICD-10-CM

## 2018-10-27 MED ORDER — PRAVASTATIN SODIUM 20 MG PO TABS: 20.0000 mg | ORAL_TABLET | Freq: Every day | ORAL | 3 refills | Status: DC

## 2018-11-01 ENCOUNTER — Encounter: Payer: Self-pay | Admitting: Sports Medicine

## 2018-11-03 ENCOUNTER — Ambulatory Visit (INDEPENDENT_AMBULATORY_CARE_PROVIDER_SITE_OTHER): Payer: PPO | Admitting: Sports Medicine

## 2018-11-03 ENCOUNTER — Ambulatory Visit: Payer: Self-pay

## 2018-11-03 VITALS — BP 102/70 | HR 77 | Ht 65.0 in | Wt 175.8 lb

## 2018-11-03 DIAGNOSIS — M7711 Lateral epicondylitis, right elbow: Secondary | ICD-10-CM

## 2018-11-03 DIAGNOSIS — M75101 Unspecified rotator cuff tear or rupture of right shoulder, not specified as traumatic: Secondary | ICD-10-CM

## 2018-11-03 DIAGNOSIS — M25521 Pain in right elbow: Secondary | ICD-10-CM

## 2018-11-03 DIAGNOSIS — G8929 Other chronic pain: Secondary | ICD-10-CM | POA: Diagnosis not present

## 2018-11-03 DIAGNOSIS — M25511 Pain in right shoulder: Secondary | ICD-10-CM

## 2018-11-03 MED ORDER — AMITRIPTYLINE HCL 25 MG PO TABS: 25.0000 mg | ORAL_TABLET | Freq: Every day | ORAL | 3 refills | Status: DC

## 2018-11-03 NOTE — Procedures (Signed)
LIMITED [facility] ULTRASOUND OF Right shoulder, elbow Images were obtained and interpreted by myself, Teresa Coombs, DO  Images have been saved and stored to PACS system. Images obtained on: GE S7 Ultrasound machine  FINDINGS:   Right elbow with improved areas of hypoechoic change within the common extensor tendon origin.    There is marked increased neovascularity within the prior areas of dysfunction.  This is significantly improved compared to the ultrasound at the time of the PRP.    Good tissue congruency.   Right shoulder with intact supraspinatus tendon with a small amount of bursal swelling.    She does have poor tissue mobility consistent with early adhesive capsulitis.  IMPRESSION:  1. Healing lateral epicondylosis 2. Healing rotator cuff tendinosis involving the supraspinatus with likely postprocedural early adhesive capsulitis.

## 2018-11-03 NOTE — Progress Notes (Signed)
[name], [place] [facility] [name]  Jodean Valade - 68 y.o. female MRN [id number]  Date of birth: [birth year]  Visit Date: November 08, 2018  PCP: [name], DO   Referred by: [name], DO  SUBJECTIVE:  Chief Complaint  Patient presents with  . Right Shoulder - Follow-up    PRP inj 10/05/2018. MRI R shoulder 08/22/2018.   . Right Elbow - Follow-up    PRP inj 10/05/2018. MRI R elbow 08/22/2018.     HPI: Patient is here for follow-up after PRP performed on 10/05/2018.  She reports her pain is moderate and continues to be improving.  She has some increased range of motion.  She has swelling that is mild in her fingers this is mild.  She continues to use the shoulder immobilizer.  Becoming more independent in her ADLs but she does have right-sided neck tightness and right-sided chest tightness and pain.  She has continues take the trazodone and gabapentin.  She continues to perform her Codman exercises 3 times a day.  She is no longer using the sling at bedtime or right after exercises but otherwise has been diligently in this.  REVIEW OF SYSTEMS: Per HPI   HISTORY:  Prior history reviewed and updated per electronic medical record.  Social History   Occupational History  . Occupation: Retired  Tobacco Use  . Smoking status: Never Smoker  . Smokeless tobacco: Never Used  Substance and Sexual Activity  . Alcohol use: Yes    Comment: very limited   . Drug use: Never  . Sexual activity: Not on file   Social History   Social History Narrative   Retired Licensed conveyancer   Born [place] up in [place] in [place] for 28 years   Moved to [place] in 2019   Twin daughters     OBJECTIVE:  VS:  HT:5\' 5"  (165.1 cm)   WT:175 lb 12.8 oz (79.7 kg)  BMI:29.25    BP:102/70  HR:77bpm  TEMP: ( )  RESP:97 %   PHYSICAL EXAM: Adult female. No acute distress.  Alert and  appropriate. Right shoulder has limited shoulder arc especially at 30 degrees of abduction.  Her intrinsic rotator cuff strength with internal rotation, external rotation and forward flexion are normal although she has marked limitations in range of motion with this.  Her right elbow is normal-appearing with no significant tissue disruption.  She has decreased pain at the lateral epicondyle but continues to have pain over the common extensor tendons.   ASSESSMENT   1. Chronic right shoulder pain   2. Right elbow pain   3. Lateral epicondylitis of right elbow   4. Rotator cuff syndrome of right shoulder      PROCEDURES:      PLAN:  Pertinent additional documentation may be included in corresponding procedure notes, imaging studies, problem based documentation and patient instructions.  Her elbow is doing significantly better. Right shoulder has evidence of good healing occurring within the supraspinatus tendon but she does have a generalized hyperesthesia in the area and I am concerned for a likely early adhesive capsulitis.  I would like to transition her from gabapentin to amitriptyline and see if this is beneficial for the is a adhesive capsulitis as well.  I would like to plan to have her follow-up in 2 additional weeks to consider the addition of nitroglycerin back to her treatment regimen.  We will discuss this further at follow-up.  Formal physical therapy referral placed today as well to begin working on appropriate rehab for rotator cuff mainly as well as for the lateral epicondylosis.  Ultimately this should be addressed like a postsurgical/posterior repair rotator cuff rehab.  We did discuss adhesive capsulitis is a possible outcome following any type of shoulder intervention and if any persistent ongoing symptoms would consider large-volume intra-articular injection.  I would like to see her wean out of the arm sling at this time but okay to use just for symptomatic relief but  she should not be spending the majority of her time using this.  Referral to physical therapy placed today  TENDINOPATHY - Discussed that the anticipated amount of time for healing is 12- 24 weeks for Tendinopathic changes.  Emphasized the importance of improving blood flow as well as eccentric loading of the tendon.   Discussed appropriate use of both heat and ice with the patient today.    Activity modifications and the importance of avoiding exacerbating activities (limiting pain to no more than a 4 / 10 during or following activity) recommended and discussed.   Discussed red flag symptoms that warrant earlier emergent evaluation and patient voices understanding.    Meds ordered this encounter  Medications  . amitriptyline (ELAVIL) 25 MG tablet    Sig: Take 1 tablet (25 mg total) by mouth at bedtime.    Dispense:  30 tablet    Refill:  3    Referral Orders     Ambulatory referral to Physical Therapy   Return in about 2 weeks (around 11/17/2018).          Gerda Diss, [facility] Physician

## 2018-11-08 ENCOUNTER — Encounter: Payer: Self-pay | Admitting: Sports Medicine

## 2018-11-11 ENCOUNTER — Encounter

## 2018-11-11 ENCOUNTER — Encounter: Payer: Self-pay | Admitting: Physical Therapy

## 2018-11-11 ENCOUNTER — Ambulatory Visit: Payer: PPO | Admitting: Physical Therapy

## 2018-11-11 DIAGNOSIS — M25511 Pain in right shoulder: Secondary | ICD-10-CM

## 2018-11-11 DIAGNOSIS — M6281 Muscle weakness (generalized): Secondary | ICD-10-CM

## 2018-11-11 DIAGNOSIS — M25521 Pain in right elbow: Secondary | ICD-10-CM

## 2018-11-11 NOTE — Patient Instructions (Addendum)
Access Code: [email or web address]  URL: [email or web address]  Date: 11/11/2018  Prepared by: Lyndee Hensen   Exercises  Supine Active Assistive Single Arm Shoulder flexion  - 10 reps - 1 sets - 5 hold - 3x daily  Supine Shoulder Flexion Extension AAROM with Dowel - 10 reps - 1 sets - 3x daily  Supine Shoulder External Rotation with Dowel - 10 reps - 1 sets - 3x daily  Seated Shoulder Flexion Towel Slide at Table Top - 10 reps - 1 sets - 5 hold - 2x daily  Seated Scapular Retraction - 10 reps - 1 sets - 2x daily  Circular Shoulder Pendulum with Table Support - 10 reps - 2 sets - 2x daily

## 2018-11-12 ENCOUNTER — Telehealth: Payer: Self-pay | Admitting: Family Medicine

## 2018-11-12 ENCOUNTER — Encounter: Payer: Self-pay | Admitting: Sports Medicine

## 2018-11-12 NOTE — Telephone Encounter (Signed)
See note  Copied from [facility] [phone]. Topic: General - Other >> Nov 12, 2018 12:34 PM Mcneil, Ja-Kwan wrote: Reason for CRM: Pt stated that she received a denial for the arm sling due to non-participating provider. Pt provided a denial reason code: [id number] and a procedure code: I3382. Pt stated that she needs a statement from Dr. Paulla Fore to assist with the appeals process as it requires her to get a supporting statement for medical necessity along with the provider sign off.

## 2018-11-12 NOTE — Telephone Encounter (Signed)
Think this is for you guys

## 2018-11-12 NOTE — Progress Notes (Signed)
Mary Hernandez. Mary Hernandez, [place] at [place]  Mary Hernandez - 68 y.o. female MRN [id number]  Date of birth: Feb 08, 1951  Visit Date: 12/06/2019February 6, 2020  PCP: [name], DO   Referred by: [name], DO  SUBJECTIVE:   Chief Complaint  Patient presents with  . f/u R shoulder and R elbow    Sx have improved slightly. D/c Celebrex, Amitriptyline. Following Nitro Protocol, increased dose 1/4 in 2 areas. Taking Tylenol and Trazodone. Has been to PT at [facility], tried dry needling, now doing HEP.   Marland Kitchen MRI review    HPI: Symptoms as above.  MRIs do show Tendinopathic changes within the rotator cuff and small partial-thickness tearing as well as  REVIEW OF SYSTEMS: Per HPI  HISTORY:  Prior history reviewed and updated per electronic medical record.  Patient Active Problem List   Diagnosis Date Noted  . Chronic right shoulder pain 09/11/2018    MRI R shoulder 08/22/18: IMPRESSION: 1. Calcific tendinosis and low-grade partial-thickness bursal surface tear of the distal anterior supraspinatus tendon at the footprint. 2. Moderate acromioclavicular osteoarthritis.   . Myofascial pain syndrome 07/17/2018  . Lateral epicondylitis of right elbow 06/25/2018    MRI R elbow 08/22/18: IMPRESSION: 1. Partial tear of the common forearm extensor tendon origin.   . Radiculitis 05/25/2018  . Arthritis of right hip 03/30/2018  . Breast nodule 03/30/2018    Right breast. Benign. Noted 01/14/13.   . Diverticulosis 03/30/2018  . GERD (gastroesophageal reflux disease) 03/30/2018  . Nonerosive nonspecific gastritis 03/30/2018    Noted on EGD 01/2017.   Marland Kitchen Hypertension 03/01/2018  . Hyperlipidemia 03/01/2018  . Acquired hypothyroidism 03/01/2018  . Vertigo 03/01/2018  . Frequent PVCs 03/01/2018   Social History   Occupational History  . Occupation: Retired  Tobacco Use  . Smoking status: Never Smoker  . Smokeless  tobacco: Never Used  Substance and Sexual Activity  . Alcohol use: Yes    Comment: very limited   . Drug use: Never  . 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  In no acute distress.  Alert appropriate. Right elbow with focal pain over the lateral epicondyle with marked tightness within the common extensor tendons. Right shoulder with pain with Hawkins and Neer's as well as with empty can testing.  Strength is moderately intact.   ASSESSMENT:   1. Rotator cuff syndrome of right shoulder   2. Lateral epicondylitis of right elbow   3. Chronic right shoulder pain     PROCEDURES:  None  PLAN:  Pertinent additional documentation may be included in corresponding procedure notes, imaging studies, problem based documentation and patient instructions.  No problem-specific Assessment & Plan notes found for this encounter.   >50% of this 30+ minute visit spent in direct patient counseling and/or coordination of care.  Discussion was focused on education regarding the in discussing the pathoetiology and anticipated clinical course of the above condition.  We did discuss the options for  intervention including surgical intervention versus biologic therapy with PRP and she understands that this is considered experimental and she will have to pay out-of-pocket for this but is electing to go with the PRP route.  We will plan to have her follow-up for this at her convenience and provided her a short prescription for Ambien pre-procedural for preprocedural sedation.  We discussed the technical details of the procedure in detail and all questions were  answered including expected course of action and recovery following the procedure and need for short-term pain control.  Additionally she does understand that a subacromial impingement that she has will not be addressed with the PRP and that ultimately she may require surgical intervention on the shoulder.  TENDINOPATHY - Discussed that the anticipated amount of time for healing is 12- 24 weeks for Tendinopathic changes.  Emphasized the importance of improving blood flow as well as eccentric loading of the tendon.  Activity modifications and the importance of avoiding exacerbating activities (limiting pain to no more than a 4 / 10 during or following activity) recommended and discussed.  Discussed red flag symptoms that warrant earlier emergent evaluation and patient voices understanding.   Meds ordered this encounter  Medications  . DISCONTD: zolpidem (AMBIEN) 5 MG tablet    Sig: Take 1 tablet po 2 hours prior to procedure    Dispense:  5 tablet    Refill:  0   Lab Orders  No laboratory test(s) ordered today   Imaging Orders  No imaging studies ordered today   Referral Orders  No referral(s) requested today    Return for for PRP.          Gerda Diss, [facility] Physician

## 2018-11-15 ENCOUNTER — Encounter: Payer: Self-pay | Admitting: Physical Therapy

## 2018-11-15 NOTE — Therapy (Signed)
[facility] [address] [place], [place], [address] Phone: [phone]   Fax:  [phone]  Physical Therapy Evaluation  Patient Details  Name: Mary Hernandez MRN: [id number] Date of Birth: 07-14-1951 Referring Provider (PT): Teresa Coombs   Encounter Date: 11/11/2018  PT End of Session - 11/15/18 2144    Visit Number  1    Number of Visits  12    Date for PT Re-Evaluation  12/23/18    Authorization Type  HTA    PT Start Time  1100    PT Stop Time  1148    PT Time Calculation (min)  48 min    Activity Tolerance  Patient tolerated treatment well;Patient limited by pain    Behavior During Therapy  [facility] for tasks assessed/performed       Past Medical History:  Diagnosis Date  . Acquired hypothyroidism 03/01/2018   hx of goiter; s/p thyroidectomy  . Anxiety   . Atrial premature depolarization   . BPPV (benign paroxysmal positional vertigo) 03/01/2018  . Colon polyps   . Essential hypertension 03/01/2018   denies hx - on diuretic for Meniere's and beta blocker for PVCs  . Frequent PVCs 03/01/2018   Controlled on Flecainide, Acebutolol  . GERD (gastroesophageal reflux disease)   . History of cardiac catheterization    Nuc 7/16:  normal perfusion; EF 76 // LHC 8/16:  normal coronary arteries  . History of depression   . History of dizziness   . History of echocardiogram    Echo 03/2006:  Normal LVEF  . HLD (hyperlipidemia)   . Hypothyroidism   . IBS (irritable bowel syndrome)   . Insulin resistance   . Meniere's disease     Past Surgical History:  Procedure Laterality Date  . BLADDER SURGERY  1997  . BREAST BIOPSY  2013  . BREAST REDUCTION SURGERY  1999  . CARDIAC CATHETERIZATION  05/2015  . Carpel Tunnel Release    . CHOLECYSTECTOMY  2011  . COLONOSCOPY  08/20/2015  . ESOPHAGOGASTRODUODENOSCOPY  01/13/2017  . [place]  . REDUCTION MAMMAPLASTY    . REFRACTIVE SURGERY    . S/P Eye Right 1967   Muscle  Clipped   . THYROIDECTOMY  2012  . TONSILLECTOMY  1975  . TOTAL ABDOMINAL HYSTERECTOMY  1996    There were no vitals filed for this visit.   Subjective Assessment - 11/15/18 2142    Subjective  Pt states increased pain in R shoulder, in May. R handed. She also reports frozen shoulder bilaterally (in last 10 years). She states most pain with behind the back motions, elevation, reaching, ADLS. Pt is retired. She has been unable to do most activities, dressing, cooking, etc at this time, due pain. She also has had recent PRP injections. She is still wearing sling today, but states she is only wearing in public, for arm protection.     Limitations  Lifting;House hold activities;Writing    Patient Stated Goals  Decreased pain, improved use of UE.     Currently in Pain?  Yes    Pain Score  7     Pain Location  Shoulder    Pain Orientation  Right    Pain Descriptors / Indicators  Aching    Pain Type  Acute pain    Pain Onset  More than a month ago    Pain Frequency  Intermittent    Aggravating Factors   reaching, ADLS, most movements.  Multiple Pain Sites  Yes    Pain Score  5    Pain Location  Elbow    Pain Orientation  Right    Pain Descriptors / Indicators  Aching    Pain Type  Acute pain    Pain Onset  More than a month ago    Pain Frequency  Intermittent    Aggravating Factors   reaching, use of R arm, ADLs.          [facility] PT Assessment - 11/15/18 0001      Assessment   Medical Diagnosis  R shoulder pain/ R elbow Pain    Referring Provider (PT)  Teresa Coombs    Hand Dominance  Right    Prior Therapy  no      Balance Screen   Has the patient fallen in the past 6 months  No      Prior Function   Level of Independence  Independent      Cognition   Overall Cognitive Status  Within Functional Limits for tasks assessed      AROM   Right Shoulder Flexion  40 Degrees    Right Shoulder ABduction  25 Degrees    Right Shoulder Internal Rotation  60 Degrees    Right  Shoulder External Rotation  40 Degrees      PROM   Right Shoulder Flexion  85 Degrees    Right Shoulder ABduction  85 Degrees    Right Shoulder Internal Rotation  60 Degrees    Right Shoulder External Rotation  40 Degrees      Strength   Right Shoulder Extension  2/5    Right Shoulder ABduction  2/5    Right Shoulder Internal Rotation  4/5    Right Shoulder External Rotation  3+/5      Special Tests   Other special tests  Unable to perform special tests, unable to position arm, due to pain.                 Objective measurements completed on examination: See above findings.      [facility] Adult PT Treatment/Exercise - 11/15/18 0001      Exercises   Exercises  Shoulder      Shoulder Exercises: Supine   Flexion  AAROM;10 reps    Flexion Limitations  x10 with 2 UE, x10 with cane      Shoulder Exercises: Seated   Retraction  20 reps    Retraction Limitations  scap squeeze      Shoulder Exercises: Stretch   Table Stretch - Flexion  10 seconds;5 reps    Other Shoulder Stretches  Pendulum x20              PT Education - 11/15/18 2202    Education Details  PT POC, HEP     Person(s) Educated  Patient;Spouse    Methods  Explanation;Demonstration;Tactile cues;Verbal cues;Handout    Comprehension  Verbalized understanding;Verbal cues required;Tactile cues required;Need further instruction;Returned demonstration       PT Short Term Goals - 11/15/18 2147      PT SHORT TERM GOAL #1   Title  Pt to report decreased pain in R Shoulder to 4/10     Time  2    Period  Weeks    Status  New    Target Date  11/25/18      PT SHORT TERM GOAL #2   Title  Pt to demo improved PROM of R shoulder, by at least  20 degrees for flexion and abd.     Time  2    Period  Weeks    Status  New    Target Date  11/25/18        PT Long Term Goals - 11/15/18 2155      PT LONG TERM GOAL #1   Title  Pt to demo improved PROM to be WNL    Time  6    Period  Weeks    Status  New     Target Date  12/23/18      PT LONG TERM GOAL #2   Title  Pt to demo improved AROM of R shoulder, to be [facility], to improve ability for reaching, and ADLs.     Time  6    Period  Weeks    Status  New    Target Date  12/23/18      PT LONG TERM GOAL #3   Title  Pt to demo improved strength of R shoulder, to at least 4/5 to improve ability for reaching, lifting, and IADLs.     Time  6    Period  Weeks    Status  New    Target Date  12/23/18      PT LONG TERM GOAL #4   Title  Pt to report decreased pain to 0-2/10 with activity and AROM.     Time  6    Period  Weeks    Status  New    Target Date  12/23/18             Plan - 11/15/18 2203    Clinical Impression Statement  Pt presents with primary complaint of increased pain in R shoulder. Pt with severe ROM limitations for AROM, and moderate limitations for PROM, due to pain. She has poor strength measurements today, likely due to pain, unable to get arm to neutral elevation for testing. Pt with severe limitations for functional use of R UE at this time. She has inability for reaching, lifting, carrying, ADLs and IADLS with R UE. Pt admits to having poor pain tolerance, but pain levels are much elevated from what is expected, given imaging results. Pt to benefit from skilled PT to improve functional use of R UE, to return to PLOF without pain.     Clinical Presentation  Stable    Clinical Decision Making  Low    Rehab Potential  Good    PT Frequency  2x / week    PT Duration  6 weeks    PT Treatment/Interventions  ADLs/Self Care Home Management;Cryotherapy;Electrical Stimulation;Iontophoresis 4mg /ml Dexamethasone;Functional mobility training;Ultrasound;Moist Heat;Therapeutic activities;Therapeutic exercise;Neuromuscular re-education;Patient/family education;Passive range of motion;Manual techniques;Dry needling;Taping;Vasopneumatic Device;Joint Manipulations;Spinal Manipulations    Consulted and Agree with Plan of Care  Patient        Patient will benefit from skilled therapeutic intervention in order to improve the following deficits and impairments:  Abnormal gait, Decreased range of motion, Impaired UE functional use, Increased muscle spasms, Decreased endurance, Decreased activity tolerance, Pain, Improper body mechanics, Impaired flexibility, Hypomobility, Postural dysfunction, Decreased strength, Decreased mobility  Visit Diagnosis: Acute pain of right shoulder  Muscle weakness (generalized)  Right elbow pain     Problem List Patient Active Problem List   Diagnosis Date Noted  . Insomnia due to medical condition 10/18/2018  . Elevated ALT measurement 10/18/2018  . Insulin resistance 10/12/2018  . Chronic right shoulder pain 09/11/2018  . Myofascial pain syndrome 07/17/2018  . Lateral epicondylitis of right  elbow 06/25/2018  . Radiculitis 05/25/2018  . Arthritis of right hip 03/30/2018  . Breast nodule 03/30/2018  . Diverticulosis 03/30/2018  . GERD (gastroesophageal reflux disease) 03/30/2018  . Nonerosive nonspecific gastritis 03/30/2018  . Hypertension 03/01/2018  . Hyperlipidemia 03/01/2018  . Acquired hypothyroidism 03/01/2018  . Vertigo 03/01/2018  . Frequent PVCs 03/01/2018    Lyndee Hensen, PT, DPT 10:12 PM  11/15/18    [place], [place], [address] Phone: [phone]   Fax:  [phone]  Name: Mary Hernandez MRN: [id number] Date of Birth: 06-18-1951

## 2018-11-16 ENCOUNTER — Encounter: Payer: Self-pay | Admitting: Physical Therapy

## 2018-11-16 ENCOUNTER — Ambulatory Visit: Payer: PPO | Admitting: Physical Therapy

## 2018-11-16 DIAGNOSIS — M25521 Pain in right elbow: Secondary | ICD-10-CM

## 2018-11-16 DIAGNOSIS — M6281 Muscle weakness (generalized): Secondary | ICD-10-CM | POA: Diagnosis not present

## 2018-11-16 DIAGNOSIS — M25511 Pain in right shoulder: Secondary | ICD-10-CM

## 2018-11-16 NOTE — Therapy (Signed)
[facility] [address] [place], [place], [address] Phone: [phone]   Fax:  [phone]  Physical Therapy Treatment  Patient Details  Name: Mary Hernandez MRN: [id number] Date of Birth: [birth year] Referring Provider (PT): Teresa Coombs   Encounter Date: 11/16/2018  PT End of Session - 11/16/18 1213    Visit Number  2    Number of Visits  12    Date for PT Re-Evaluation  12/23/18    Authorization Type  HTA    PT Start Time  1020    PT Stop Time  1100    PT Time Calculation (min)  40 min    Activity Tolerance  Patient tolerated treatment well;Patient limited by pain    Behavior During Therapy  [facility] for tasks assessed/performed       Past Medical History:  Diagnosis Date  . Acquired hypothyroidism 03/01/2018   hx of goiter; s/p thyroidectomy  . Anxiety   . Atrial premature depolarization   . BPPV (benign paroxysmal positional vertigo) 03/01/2018  . Colon polyps   . Essential hypertension 03/01/2018   denies hx - on diuretic for Meniere's and beta blocker for PVCs  . Frequent PVCs 03/01/2018   Controlled on Flecainide, Acebutolol  . GERD (gastroesophageal reflux disease)   . History of cardiac catheterization    Nuc 7/16:  normal perfusion; EF 76 // LHC 8/16:  normal coronary arteries  . History of depression   . History of dizziness   . History of echocardiogram    Echo 03/2006:  Normal LVEF  . HLD (hyperlipidemia)   . Hypothyroidism   . IBS (irritable bowel syndrome)   . Insulin resistance   . Meniere's disease     Past Surgical History:  Procedure Laterality Date  . BLADDER SURGERY  1997  . BREAST BIOPSY  2013  . BREAST REDUCTION SURGERY  1999  . CARDIAC CATHETERIZATION  05/2015  . Carpel Tunnel Release    . CHOLECYSTECTOMY  2011  . COLONOSCOPY  08/20/2015  . ESOPHAGOGASTRODUODENOSCOPY  01/13/2017  . [place]  . REDUCTION MAMMAPLASTY    . REFRACTIVE SURGERY    . S/P Eye Right 1967   Muscle  Clipped   . THYROIDECTOMY  2012  . TONSILLECTOMY  1975  . TOTAL ABDOMINAL HYSTERECTOMY  1996    There were no vitals filed for this visit.  Subjective Assessment - 11/16/18 1212    Subjective  Pt states soreness in shoulder. She has been doing HEP. Did purchase pulley.     Currently in Pain?  Yes    Pain Score  6     Pain Location  Shoulder    Pain Orientation  Right    Pain Descriptors / Indicators  Aching    Pain Type  Acute pain    Pain Onset  More than a month ago    Pain Frequency  Intermittent    Pain Score  3    Pain Location  Elbow    Pain Orientation  Right    Pain Descriptors / Indicators  Aching    Pain Type  Acute pain    Pain Onset  More than a month ago    Pain Frequency  Intermittent                       [facility] Adult PT Treatment/Exercise - 11/16/18 1044      Exercises   Exercises  Shoulder  Shoulder Exercises: Supine   External Rotation  AAROM;20 reps    External Rotation Limitations  cane    Flexion  AAROM;20 reps    Flexion Limitations  x10 with 2 UE, x10 with cane    Other Supine Exercises  Chest press AAROm with cane x10;       Shoulder Exercises: Seated   Retraction  20 reps    Retraction Limitations  scap squeeze      Shoulder Exercises: Standing   Other Standing Exercises  wall slides 2 UE, x15;  Wall walk flexion x5;        Shoulder Exercises: Pulleys   Flexion  1 minute    Scaption  1 minute      Shoulder Exercises: Stretch   Table Stretch - Flexion  10 seconds;5 reps    Table Stretch -Flexion Limitations  Standing.     Other Shoulder Stretches  --      Manual Therapy   Manual Therapy  Passive ROM;Joint mobilization    Joint Mobilization  GHJ mobs posterior and inferior     Passive ROM   R GHJ all motions, R elbow                PT Short Term Goals - 11/15/18 2147      PT SHORT TERM GOAL #1   Title  Pt to report decreased pain in R Shoulder to 4/10     Time  2    Period  Weeks    Status  New     Target Date  11/25/18      PT SHORT TERM GOAL #2   Title  Pt to demo improved PROM of R shoulder, by at least 20 degrees for flexion and abd.     Time  2    Period  Weeks    Status  New    Target Date  11/25/18        PT Long Term Goals - 11/15/18 2155      PT LONG TERM GOAL #1   Title  Pt to demo improved PROM to be WNL    Time  6    Period  Weeks    Status  New    Target Date  12/23/18      PT LONG TERM GOAL #2   Title  Pt to demo improved AROM of R shoulder, to be [facility], to improve ability for reaching, and ADLs.     Time  6    Period  Weeks    Status  New    Target Date  12/23/18      PT LONG TERM GOAL #3   Title  Pt to demo improved strength of R shoulder, to at least 4/5 to improve ability for reaching, lifting, and IADLs.     Time  6    Period  Weeks    Status  New    Target Date  12/23/18      PT LONG TERM GOAL #4   Title  Pt to report decreased pain to 0-2/10 with activity and AROM.     Time  6    Period  Weeks    Status  New    Target Date  12/23/18            Plan - 11/16/18 1214    Clinical Impression Statement  Pt with mild improvments of PROM today. Ther ex progressed with AAROM, and for ER, added to HEP today. Pt with  most pain with ER. Plan to progress ROM as tolerated.     Rehab Potential  Good    PT Frequency  2x / week    PT Duration  6 weeks    PT Treatment/Interventions  ADLs/Self Care Home Management;Cryotherapy;Electrical Stimulation;Iontophoresis 4mg /ml Dexamethasone;Functional mobility training;Ultrasound;Moist Heat;Therapeutic activities;Therapeutic exercise;Neuromuscular re-education;Patient/family education;Passive range of motion;Manual techniques;Dry needling;Taping;Vasopneumatic Device;Joint Manipulations;Spinal Manipulations    Consulted and Agree with Plan of Care  Patient       Patient will benefit from skilled therapeutic intervention in order to improve the following deficits and impairments:  Abnormal gait, Decreased range  of motion, Impaired UE functional use, Increased muscle spasms, Decreased endurance, Decreased activity tolerance, Pain, Improper body mechanics, Impaired flexibility, Hypomobility, Postural dysfunction, Decreased strength, Decreased mobility  Visit Diagnosis: Acute pain of right shoulder  Muscle weakness (generalized)  Right elbow pain     Problem List Patient Active Problem List   Diagnosis Date Noted  . Insomnia due to medical condition 10/18/2018  . Elevated ALT measurement 10/18/2018  . Insulin resistance 10/12/2018  . Chronic right shoulder pain 09/11/2018  . Myofascial pain syndrome 07/17/2018  . Lateral epicondylitis of right elbow 06/25/2018  . Radiculitis 05/25/2018  . Arthritis of right hip 03/30/2018  . Breast nodule 03/30/2018  . Diverticulosis 03/30/2018  . GERD (gastroesophageal reflux disease) 03/30/2018  . Nonerosive nonspecific gastritis 03/30/2018  . Hypertension 03/01/2018  . Hyperlipidemia 03/01/2018  . Acquired hypothyroidism 03/01/2018  . Vertigo 03/01/2018  . Frequent PVCs 03/01/2018    Lyndee Hensen, PT, DPT 12:15 PM  11/16/18    [place], [place], [address] Phone: [phone]   Fax:  [phone]  Name: Mary Hernandez MRN: [id number] Date of Birth: January 01, 1951

## 2018-11-17 ENCOUNTER — Encounter: Payer: Self-pay | Admitting: Sports Medicine

## 2018-11-17 ENCOUNTER — Ambulatory Visit (INDEPENDENT_AMBULATORY_CARE_PROVIDER_SITE_OTHER): Payer: PPO | Admitting: Sports Medicine

## 2018-11-17 VITALS — BP 94/68 | HR 68 | Ht 65.0 in | Wt 176.4 lb

## 2018-11-17 DIAGNOSIS — M9902 Segmental and somatic dysfunction of thoracic region: Secondary | ICD-10-CM

## 2018-11-17 DIAGNOSIS — M7711 Lateral epicondylitis, right elbow: Secondary | ICD-10-CM | POA: Diagnosis not present

## 2018-11-17 DIAGNOSIS — M25521 Pain in right elbow: Secondary | ICD-10-CM

## 2018-11-17 DIAGNOSIS — G8929 Other chronic pain: Secondary | ICD-10-CM

## 2018-11-17 DIAGNOSIS — M75101 Unspecified rotator cuff tear or rupture of right shoulder, not specified as traumatic: Secondary | ICD-10-CM | POA: Diagnosis not present

## 2018-11-17 DIAGNOSIS — M9901 Segmental and somatic dysfunction of cervical region: Secondary | ICD-10-CM

## 2018-11-17 DIAGNOSIS — M7918 Myalgia, other site: Secondary | ICD-10-CM | POA: Diagnosis not present

## 2018-11-17 DIAGNOSIS — M25511 Pain in right shoulder: Secondary | ICD-10-CM | POA: Diagnosis not present

## 2018-11-17 DIAGNOSIS — M9907 Segmental and somatic dysfunction of upper extremity: Secondary | ICD-10-CM

## 2018-11-17 NOTE — Telephone Encounter (Signed)
Letter completed and will give to pt at today's visit.

## 2018-11-17 NOTE — Progress Notes (Signed)
Mary Hernandez. [place], [facility] [place]  Mary Hernandez - 68 y.o. female MRN [id number]  Date of birth: [birth year]  Visit Date: November 17, 2018  PCP: [name], DO   Referred by: [name], DO  SUBJECTIVE:  Chief Complaint  Patient presents with  . Right Shoulder - Follow-up  . Right Elbow - Follow-up    HPI: Patient is here in follow-up of right shoulder and elbow pain as well as neck and back pain.  She is 6 weeks out from [facility] and reports that her elbow is feeling better than prior to the PRP injection her shoulder still stiff and sore.  She has worked with physical therapy has some soreness following this but is significantly improved since last visit.  Range of motion continues to improve in her shoulder.  She is sleeping well at night.  No nighttime disturbances.  She is taking the amitriptyline 25 mg as well as gabapentin at night.  She has been out of the shoulder immobilizer for the past 1-1/2 to 2 weeks.  She is performing home therapeutic exercises 3 times a day.  REVIEW OF SYSTEMS: No significant nighttime awakenings due to this issue. Per HPI  HISTORY:  Prior history reviewed and updated per electronic medical record.  Patient Active Problem List   Diagnosis Date Noted  . Insomnia due to medical condition 10/18/2018  . Elevated ALT measurement 10/18/2018  . Insulin resistance 10/12/2018  . Chronic right shoulder pain 09/11/2018    MRI R shoulder 08/22/18: IMPRESSION: 1. Calcific tendinosis and low-grade partial-thickness bursal surface tear of the distal anterior supraspinatus tendon at the footprint. 2. Moderate acromioclavicular osteoarthritis.  10/05/18 - PRP inj   . Myofascial pain syndrome 07/17/2018  . Lateral epicondylitis of right elbow 06/25/2018    MRI R elbow 08/22/18: IMPRESSION: 1. Partial tear of the common forearm extensor tendon origin.  09/25/18 - PRP inj    . Radiculitis  05/25/2018  . Arthritis of right hip 03/30/2018  . Breast nodule 03/30/2018    Right breast. Benign. Noted 01/14/13.   . Diverticulosis 03/30/2018  . GERD (gastroesophageal reflux disease) 03/30/2018  . Nonerosive nonspecific gastritis 03/30/2018    Noted on EGD 01/2017.   [facility] Hypertension 03/01/2018  . Hyperlipidemia 03/01/2018  . Acquired hypothyroidism 03/01/2018  . Vertigo 03/01/2018  . Frequent PVCs 03/01/2018   Social History   Occupational History  . Occupation: Retired  Tobacco Use  . Smoking status: Never Smoker  . Smokeless tobacco: Never Used  Substance and Sexual Activity  . Alcohol use: Yes    Comment: very limited   . Drug use: Never  . Sexual activity: Not on file   Social History   Social History Narrative   Retired Licensed conveyancer   Born [place] in [place] for 55 years   Moved to [place] in 2019   Twin daughters    OBJECTIVE:  VS:  HT:5\' 5"  (165.1 cm)   WT:176 lb 6.4 oz (80 kg)  BMI:29.35    BP:94/68  HR:68bpm  TEMP: ( )  RESP:96 %   PHYSICAL EXAM: Adult female. No acute distress.  Alert and appropriate. Right shoulder elbow overall well aligned.  She has good elbow flexion extension with full range of motion.  Mild pain over the common extensor tendons and musculature but this is significantly improved.  She is still tight but once again  the forearm compartment is more supple than in the past.  Her right shoulder has limited overhead range of motion although this is improved compared to last visit.  She has a shoulder arc while held at 30 degrees of abduction approaching 60 degrees.  Intrinsic rotator cuff strength with internal rotation, external rotation, empty can testing strength is 5/5 although this does produce pain.  She has marked paraspinal cervical muscle spasms.  Negative Spurling's compression test.   ASSESSMENT:   1. Chronic right shoulder pain   2. Right elbow pain   3. Rotator cuff  syndrome of right shoulder   4. Lateral epicondylitis of right elbow   5. Myofascial pain syndrome   6. Somatic dysfunction of cervical region   7. Somatic dysfunction of thoracic region   8. Somatic dysfunction of upper extremity     PROCEDURES:  PROCEDURE NOTE: OSTEOPATHIC MANIPULATION  The decision today to treat with Osteopathic Manipulative Therapy (OMT) was based on physical exam findings. Verbal consent was obtained following a discussion with the patient regarding the of risks, benefits and potential side effects, including an acute pain flare,post manipulation soreness and need for repeat treatments.   Contraindications to OMT: NONE Manipulation was performed as below: [facility] Treated & Osteopathic Exam Findings CERVICAL SPINE: OA - rotated right C5 FRS RIGHT (Flexed, Rotated & Sidebent) THORACIC SPINE:  T2 - 6 Neutral, rotated RIGHT, sidebent LEFT UPPER EXTREMITIES: UPPER EXTREM: Tissue texture changes over the inferior triangle of the shoulder.  Tenderness of the common extensor tendons. Right supraspinatous tenderpoint Right trapezius tenderpoint Right anterior radial head  OMT Techniques Used: muscle energy myofascial release articulatory HVLA - (Not in cervical spine region)  The patient tolerated the treatment well and reported Improved symptoms following treatment today. Patient was given medications, exercises, stretches and lifestyle modifications per AVS and verbally.      PLAN:  Pertinent additional documentation may be included in corresponding procedure notes, imaging studies, problem based documentation and patient instructions.  No problem-specific Assessment & Plan notes found for this encounter.   She is continue to improve.  She could be a candidate for dry needling if any lack of improvement.  Other options would also consider changing from amitriptyline to Cymbalta given the generalized myofascial pain syndrome that she has this will be followed  up in 2 weeks I see her back for repeat consideration of osteopathic manipulation.  Continue previously prescribed home exercise program.  Continue working with physical therapy  Osteopathic manipulation was performed today based on physical exam findings.  Patient has responded well to osteopathic manipulation previously the prior manipulation did not provide permanent long lasting relief.  The patient does feel as though there was significant benefit to the prior manipulation and they wished for repeat manipulation today.  They understand that home therapeutic exercises are critical part of the healing/treatment process and will continue with self treatment between now and their next visit as outlined.  The patient understands that the frequency of visits is meant to provide a stimulus to promote the body's own ability to heal and is not meant to be the sole means for improvement in their symptoms.  Activity modifications and the importance of avoiding exacerbating activities (limiting pain to no more than a 4 / 10 during or following activity) recommended and discussed.  Discussed red flag symptoms that warrant earlier emergent evaluation and patient voices understanding.   No orders of the defined types were placed in this encounter.  Lab Orders  No  laboratory test(s) ordered today   Imaging Orders  No imaging studies ordered today   Referral Orders  No referral(s) requested today    Return in about 2 weeks (around 12/01/2018) for consideration of repeat Osteopathic Manipulation.          Gerda Diss, [facility] Physician

## 2018-11-18 ENCOUNTER — Ambulatory Visit: Payer: PPO | Admitting: Physical Therapy

## 2018-11-18 ENCOUNTER — Encounter: Payer: Self-pay | Admitting: Physical Therapy

## 2018-11-18 DIAGNOSIS — M25521 Pain in right elbow: Secondary | ICD-10-CM

## 2018-11-18 DIAGNOSIS — M25511 Pain in right shoulder: Secondary | ICD-10-CM

## 2018-11-18 DIAGNOSIS — G8929 Other chronic pain: Secondary | ICD-10-CM

## 2018-11-18 NOTE — Patient Instructions (Signed)
Access Code: [email or web address]  URL: [email or web address]  Date: 11/18/2018  Prepared by: Lyndee Hensen   Exercises  Supine Active Assistive Single Arm Shoulder Protraction - 10 reps - 1 sets - 5 hold - 3x daily  Supine Shoulder Flexion Extension AAROM with Dowel - 10 reps - 1 sets - 3x daily  Supine Shoulder External Rotation with Dowel - 10 reps - 1 sets - 3x daily  Seated Shoulder Flexion Towel Slide at Table Top - 10 reps - 1 sets - 5 hold - 2x daily  Seated Scapular Retraction - 10 reps - 1 sets - 2x daily  Circular Shoulder Pendulum with Table Support - 10 reps - 2 sets - 2x daily  Supine Chest Stretch with Elbows Bent - 10 reps - 2 sets - 2x daily  Supine Chest Stretch with Elbows Bent - 10 reps - 10 hold - 2x daily  Standing Shoulder Internal Rotation Stretch with Hands Behind Back - 10 reps - 5 hold - 2x daily  Doorway Pec Stretch at 60 Elevation - 3 reps - 30 hold - 2x daily

## 2018-11-18 NOTE — Therapy (Signed)
[facility] [address] [place], [place], [address] Phone: [phone]   Fax:  [phone]  Physical Therapy Treatment  Patient Details  Name: Mary Hernandez MRN: [id number] Date of Birth: [birth year] Referring Provider (PT): Teresa Coombs   Encounter Date: 11/18/2018  PT End of Session - 11/18/18 1247    Visit Number  3    Number of Visits  12    Date for PT Re-Evaluation  12/23/18    Authorization Type  HTA    PT Start Time  1217    PT Stop Time  1303    PT Time Calculation (min)  46 min    Activity Tolerance  Patient tolerated treatment well;Patient limited by pain    Behavior During Therapy  [facility] for tasks assessed/performed       Past Medical History:  Diagnosis Date  . Acquired hypothyroidism 03/01/2018   hx of goiter; s/p thyroidectomy  . Anxiety   . Atrial premature depolarization   . BPPV (benign paroxysmal positional vertigo) 03/01/2018  . Colon polyps   . Essential hypertension 03/01/2018   denies hx - on diuretic for Meniere's and beta blocker for PVCs  . Frequent PVCs 03/01/2018   Controlled on Flecainide, Acebutolol  . GERD (gastroesophageal reflux disease)   . History of cardiac catheterization    Nuc 7/16:  normal perfusion; EF 76 // LHC 8/16:  normal coronary arteries  . History of depression   . History of dizziness   . History of echocardiogram    Echo 03/2006:  Normal LVEF  . HLD (hyperlipidemia)   . Hypothyroidism   . IBS (irritable bowel syndrome)   . Insulin resistance   . Meniere's disease     Past Surgical History:  Procedure Laterality Date  . BLADDER SURGERY  1997  . BREAST BIOPSY  2013  . BREAST REDUCTION SURGERY  1999  . CARDIAC CATHETERIZATION  05/2015  . Carpel Tunnel Release    . CHOLECYSTECTOMY  2011  . COLONOSCOPY  08/20/2015  . ESOPHAGOGASTRODUODENOSCOPY  01/13/2017  . [place]  . REDUCTION MAMMAPLASTY    . REFRACTIVE SURGERY    . S/P Eye Right 1967   Muscle  Clipped   . THYROIDECTOMY  2012  . TONSILLECTOMY  1975  . TOTAL ABDOMINAL HYSTERECTOMY  1996    There were no vitals filed for this visit.  Subjective Assessment - 11/18/18 1247    Subjective  Pt states soreness today.     Patient Stated Goals  Decreased pain, improved use of UE.     Currently in Pain?  Yes    Pain Score  4     Pain Location  Shoulder    Pain Orientation  Right    Pain Descriptors / Indicators  Aching    Pain Type  Acute pain    Pain Onset  More than a month ago    Pain Frequency  Intermittent    Pain Score  2    Pain Location  Elbow    Pain Orientation  Right    Pain Descriptors / Indicators  Aching    Pain Type  Acute pain                       [facility] Adult PT Treatment/Exercise - 11/18/18 1243      Exercises   Exercises  Shoulder      Shoulder Exercises: Supine   External Rotation  AAROM;20 reps  External Rotation Limitations  cane    Flexion  AAROM;20 reps    Flexion Limitations  cane    Other Supine Exercises  Chest press AAROm with cane x10;       Shoulder Exercises: Seated   Retraction  --    Retraction Limitations  --    Row  20 reps    Theraband Level (Shoulder Row)  Level 2 (Red)      Shoulder Exercises: Standing   Other Standing Exercises  wall slides 2 UE, x15;        Shoulder Exercises: Pulleys   Flexion  1 minute    Scaption  1 minute      Shoulder Exercises: Stretch   Internal Rotation Stretch  5 reps    Internal Rotation Stretch Limitations  2 hands behind back;     Table Stretch - Flexion  --    Table Stretch -Flexion Limitations  Pball rolls on table x20 flex/abd    Other Shoulder Stretches  Post shoulder stretch 30 sec     Other Shoulder Stretches  Supine ER butterfly stretch 10 sec x10;       Manual Therapy   Manual Therapy  Passive ROM;Joint mobilization    Joint Mobilization  GHJ mobs posterior and inferior     Passive ROM   R GHJ all motions, R elbow              PT Education - 11/18/18  1301    Education Details  HEP updated.     Person(s) Educated  Patient    Methods  Explanation;Handout    Comprehension  Verbalized understanding       PT Short Term Goals - 11/15/18 2147      PT SHORT TERM GOAL #1   Title  Pt to report decreased pain in R Shoulder to 4/10     Time  2    Period  Weeks    Status  New    Target Date  11/25/18      PT SHORT TERM GOAL #2   Title  Pt to demo improved PROM of R shoulder, by at least 20 degrees for flexion and abd.     Time  2    Period  Weeks    Status  New    Target Date  11/25/18        PT Long Term Goals - 11/15/18 2155      PT LONG TERM GOAL #1   Title  Pt to demo improved PROM to be WNL    Time  6    Period  Weeks    Status  New    Target Date  12/23/18      PT LONG TERM GOAL #2   Title  Pt to demo improved AROM of R shoulder, to be [facility], to improve ability for reaching, and ADLs.     Time  6    Period  Weeks    Status  New    Target Date  12/23/18      PT LONG TERM GOAL #3   Title  Pt to demo improved strength of R shoulder, to at least 4/5 to improve ability for reaching, lifting, and IADLs.     Time  6    Period  Weeks    Status  New    Target Date  12/23/18      PT LONG TERM GOAL #4   Title  Pt to report decreased pain to  0-2/10 with activity and AROM.     Time  6    Period  Weeks    Status  New    Target Date  12/23/18            Plan - 11/18/18 1303    Clinical Impression Statement  Pt able to progress ther ex for ROM today, HEP updated. Pt limited by soreness for PROM and most all ther ex. Plan to progress as tolerated.     Rehab Potential  Good    PT Frequency  2x / week    PT Duration  6 weeks    PT Treatment/Interventions  ADLs/Self Care Home Management;Cryotherapy;Electrical Stimulation;Iontophoresis 4mg /ml Dexamethasone;Functional mobility training;Ultrasound;Moist Heat;Therapeutic activities;Therapeutic exercise;Neuromuscular re-education;Patient/family education;Passive range of  motion;Manual techniques;Dry needling;Taping;Vasopneumatic Device;Joint Manipulations;Spinal Manipulations    Consulted and Agree with Plan of Care  Patient       Patient will benefit from skilled therapeutic intervention in order to improve the following deficits and impairments:  Abnormal gait, Decreased range of motion, Impaired UE functional use, Increased muscle spasms, Decreased endurance, Decreased activity tolerance, Pain, Improper body mechanics, Impaired flexibility, Hypomobility, Postural dysfunction, Decreased strength, Decreased mobility  Visit Diagnosis: Chronic right shoulder pain  Right elbow pain     Problem List Patient Active Problem List   Diagnosis Date Noted  . Insomnia due to medical condition 10/18/2018  . Elevated ALT measurement 10/18/2018  . Insulin resistance 10/12/2018  . Chronic right shoulder pain 09/11/2018  . Myofascial pain syndrome 07/17/2018  . Lateral epicondylitis of right elbow 06/25/2018  . Radiculitis 05/25/2018  . Arthritis of right hip 03/30/2018  . Breast nodule 03/30/2018  . Diverticulosis 03/30/2018  . GERD (gastroesophageal reflux disease) 03/30/2018  . Nonerosive nonspecific gastritis 03/30/2018  . Hypertension 03/01/2018  . Hyperlipidemia 03/01/2018  . Acquired hypothyroidism 03/01/2018  . Vertigo 03/01/2018  . Frequent PVCs 03/01/2018    Lyndee Hensen, PT, DPT 1:05 PM  11/18/18    [facility] [place], [place], [address] Phone: [phone]   Fax:  [phone]  Name: Larkyn Greenberger MRN: [id number] Date of Birth: 1950/11/06

## 2018-11-23 ENCOUNTER — Encounter: Payer: Self-pay | Admitting: Physical Therapy

## 2018-11-23 ENCOUNTER — Encounter: Payer: Self-pay | Admitting: Sports Medicine

## 2018-11-23 ENCOUNTER — Ambulatory Visit: Payer: PPO | Admitting: Physical Therapy

## 2018-11-23 DIAGNOSIS — G8929 Other chronic pain: Secondary | ICD-10-CM

## 2018-11-23 DIAGNOSIS — M25521 Pain in right elbow: Secondary | ICD-10-CM

## 2018-11-23 DIAGNOSIS — M25511 Pain in right shoulder: Secondary | ICD-10-CM | POA: Diagnosis not present

## 2018-11-23 NOTE — Therapy (Signed)
[facility] [address] [place], [place], [address] Phone: [phone]   Fax:  [phone]  Physical Therapy Treatment  Patient Details  Name: Mary Hernandez MRN: [id number] Date of Birth: 03-17-1951 Referring Provider (PT): Teresa Coombs   Encounter Date: 11/23/2018  PT End of Session - 11/23/18 1321    Visit Number  4    Number of Visits  12    Date for PT Re-Evaluation  12/23/18    Authorization Type  HTA    PT Start Time  2831    PT Stop Time  1345    PT Time Calculation (min)  40 min    Activity Tolerance  Patient tolerated treatment well;Patient limited by pain    Behavior During Therapy  [facility] for tasks assessed/performed       Past Medical History:  Diagnosis Date  . Acquired hypothyroidism 03/01/2018   hx of goiter; s/p thyroidectomy  . Anxiety   . Atrial premature depolarization   . BPPV (benign paroxysmal positional vertigo) 03/01/2018  . Colon polyps   . Essential hypertension 03/01/2018   denies hx - on diuretic for Meniere's and beta blocker for PVCs  . Frequent PVCs 03/01/2018   Controlled on Flecainide, Acebutolol  . GERD (gastroesophageal reflux disease)   . History of cardiac catheterization    Nuc 7/16:  normal perfusion; EF 76 // LHC 8/16:  normal coronary arteries  . History of depression   . History of dizziness   . History of echocardiogram    Echo 03/2006:  Normal LVEF  . HLD (hyperlipidemia)   . Hypothyroidism   . IBS (irritable bowel syndrome)   . Insulin resistance   . Meniere's disease     Past Surgical History:  Procedure Laterality Date  . BLADDER SURGERY  1997  . BREAST BIOPSY  2013  . BREAST REDUCTION SURGERY  1999  . CARDIAC CATHETERIZATION  05/2015  . Carpel Tunnel Release    . CHOLECYSTECTOMY  2011  . COLONOSCOPY  08/20/2015  . ESOPHAGOGASTRODUODENOSCOPY  01/13/2017  . [place]  . REDUCTION MAMMAPLASTY    . REFRACTIVE SURGERY    . S/P Eye Right 1967   Muscle  Clipped   . THYROIDECTOMY  2012  . TONSILLECTOMY  1975  . TOTAL ABDOMINAL HYSTERECTOMY  1996    There were no vitals filed for this visit.  Subjective Assessment - 11/23/18 1320    Subjective  Pt states improving motion and pain.     Currently in Pain?  Yes    Pain Score  3     Pain Location  Shoulder    Pain Orientation  Right    Pain Descriptors / Indicators  Aching    Pain Type  Acute pain    Pain Onset  More than a month ago    Pain Frequency  Intermittent                       [facility] Adult PT Treatment/Exercise - 11/23/18 1318      Exercises   Exercises  Shoulder      Shoulder Exercises: Supine   External Rotation  AAROM;20 reps    External Rotation Limitations  cane    Flexion  AAROM;20 reps    Flexion Limitations  cane    Other Supine Exercises  Chest press AAROm with cane x10;       Shoulder Exercises: Seated   Row  --  Theraband Level (Shoulder Row)  --      Shoulder Exercises: Standing   Row  20 reps    Theraband Level (Shoulder Row)  Level 2 (Red)    Other Standing Exercises  wall slides 2 UE, x15;        Shoulder Exercises: Pulleys   Flexion  1 minute    Scaption  1 minute      Shoulder Exercises: Stretch   Internal Rotation Stretch  --    Internal Rotation Stretch Limitations  --    Table Stretch -Flexion Limitations  Pball rolls on table x20 flex/abd    Other Shoulder Stretches  --    Other Shoulder Stretches  --      Manual Therapy   Manual Therapy  Passive ROM;Joint mobilization    Joint Mobilization  GHJ mobs posterior and inferior     Passive ROM   R GHJ all motions, R elbow                PT Short Term Goals - 11/15/18 2147      PT SHORT TERM GOAL #1   Title  Pt to report decreased pain in R Shoulder to 4/10     Time  2    Period  Weeks    Status  New    Target Date  11/25/18      PT SHORT TERM GOAL #2   Title  Pt to demo improved PROM of R shoulder, by at least 20 degrees for flexion and abd.     Time   2    Period  Weeks    Status  New    Target Date  11/25/18        PT Long Term Goals - 11/15/18 2155      PT LONG TERM GOAL #1   Title  Pt to demo improved PROM to be WNL    Time  6    Period  Weeks    Status  New    Target Date  12/23/18      PT LONG TERM GOAL #2   Title  Pt to demo improved AROM of R shoulder, to be [facility], to improve ability for reaching, and ADLs.     Time  6    Period  Weeks    Status  New    Target Date  12/23/18      PT LONG TERM GOAL #3   Title  Pt to demo improved strength of R shoulder, to at least 4/5 to improve ability for reaching, lifting, and IADLs.     Time  6    Period  Weeks    Status  New    Target Date  12/23/18      PT LONG TERM GOAL #4   Title  Pt to report decreased pain to 0-2/10 with activity and AROM.     Time  6    Period  Weeks    Status  New    Target Date  12/23/18            Plan - 11/23/18 1339    Clinical Impression Statement  Pt with improving PROM, up to 100 degrees for flexion and abd today, does have pain and GHJ stiffness at this level that limits further motion. Pt with increased muscle guarding with PROM due to pain. Plan to progress as tolerated.     Rehab Potential  Good    PT Frequency  2x / week  PT Duration  6 weeks    PT Treatment/Interventions  ADLs/Self Care Home Management;Cryotherapy;Electrical Stimulation;Iontophoresis 4mg /ml Dexamethasone;Functional mobility training;Ultrasound;Moist Heat;Therapeutic activities;Therapeutic exercise;Neuromuscular re-education;Patient/family education;Passive range of motion;Manual techniques;Dry needling;Taping;Vasopneumatic Device;Joint Manipulations;Spinal Manipulations    Consulted and Agree with Plan of Care  Patient       Patient will benefit from skilled therapeutic intervention in order to improve the following deficits and impairments:  Abnormal gait, Decreased range of motion, Impaired UE functional use, Increased muscle spasms, Decreased endurance,  Decreased activity tolerance, Pain, Improper body mechanics, Impaired flexibility, Hypomobility, Postural dysfunction, Decreased strength, Decreased mobility  Visit Diagnosis: Chronic right shoulder pain  Right elbow pain     Problem List Patient Active Problem List   Diagnosis Date Noted  . Insomnia due to medical condition 10/18/2018  . Elevated ALT measurement 10/18/2018  . Insulin resistance 10/12/2018  . Chronic right shoulder pain 09/11/2018  . Myofascial pain syndrome 07/17/2018  . Lateral epicondylitis of right elbow 06/25/2018  . Radiculitis 05/25/2018  . Arthritis of right hip 03/30/2018  . Breast nodule 03/30/2018  . Diverticulosis 03/30/2018  . GERD (gastroesophageal reflux disease) 03/30/2018  . Nonerosive nonspecific gastritis 03/30/2018  . Hypertension 03/01/2018  . Hyperlipidemia 03/01/2018  . Acquired hypothyroidism 03/01/2018  . Vertigo 03/01/2018  . Frequent PVCs 03/01/2018    Lyndee Hensen, PT, DPT 1:43 PM  11/23/18    [facility] [place], [place], [address] Phone: [phone]   Fax:  [phone]  Name: Mary Hernandez MRN: [id number] Date of Birth: Oct 20, 1950

## 2018-11-25 ENCOUNTER — Encounter: Payer: Self-pay | Admitting: Physical Therapy

## 2018-11-25 ENCOUNTER — Ambulatory Visit: Payer: PPO | Admitting: Physical Therapy

## 2018-11-25 DIAGNOSIS — M25521 Pain in right elbow: Secondary | ICD-10-CM

## 2018-11-25 DIAGNOSIS — G8929 Other chronic pain: Secondary | ICD-10-CM

## 2018-11-25 DIAGNOSIS — M25511 Pain in right shoulder: Secondary | ICD-10-CM | POA: Diagnosis not present

## 2018-11-25 NOTE — Therapy (Signed)
[facility] [address] [place], [place], [address] Phone: [phone]   Fax:  [phone]  Physical Therapy Treatment  Patient Details  Name: Mary Hernandez MRN: [id number] Date of Birth: 05/06/1951 Referring Provider (PT): Teresa Coombs   Encounter Date: 11/25/2018  PT End of Session - 11/25/18 1108    Visit Number  5    Number of Visits  12    Date for PT Re-Evaluation  12/23/18    Authorization Type  HTA    PT Start Time  0935    PT Stop Time  1015    PT Time Calculation (min)  40 min    Activity Tolerance  Patient tolerated treatment well;Patient limited by pain    Behavior During Therapy  [facility] for tasks assessed/performed       Past Medical History:  Diagnosis Date  . Acquired hypothyroidism 03/01/2018   hx of goiter; s/p thyroidectomy  . Anxiety   . Atrial premature depolarization   . BPPV (benign paroxysmal positional vertigo) 03/01/2018  . Colon polyps   . Essential hypertension 03/01/2018   denies hx - on diuretic for Meniere's and beta blocker for PVCs  . Frequent PVCs 03/01/2018   Controlled on Flecainide, Acebutolol  . GERD (gastroesophageal reflux disease)   . History of cardiac catheterization    Nuc 7/16:  normal perfusion; EF 76 // LHC 8/16:  normal coronary arteries  . History of depression   . History of dizziness   . History of echocardiogram    Echo 03/2006:  Normal LVEF  . HLD (hyperlipidemia)   . Hypothyroidism   . IBS (irritable bowel syndrome)   . Insulin resistance   . Meniere's disease     Past Surgical History:  Procedure Laterality Date  . BLADDER SURGERY  1997  . BREAST BIOPSY  2013  . BREAST REDUCTION SURGERY  1999  . CARDIAC CATHETERIZATION  05/2015  . Carpel Tunnel Release    . CHOLECYSTECTOMY  2011  . COLONOSCOPY  08/20/2015  . ESOPHAGOGASTRODUODENOSCOPY  01/13/2017  . [place]  . REDUCTION MAMMAPLASTY    . REFRACTIVE SURGERY    . S/P Eye Right 1967   Muscle  Clipped   . THYROIDECTOMY  2012  . TONSILLECTOMY  1975  . TOTAL ABDOMINAL HYSTERECTOMY  1996    There were no vitals filed for this visit.  Subjective Assessment - 11/25/18 1107    Subjective  Pt had massage yesterday. States improivng ROM.     Limitations  Lifting;House hold activities;Writing    Patient Stated Goals  Decreased pain, improved use of UE.     Currently in Pain?  Yes    Pain Score  4     Pain Location  Shoulder    Pain Orientation  Right    Pain Descriptors / Indicators  Aching    Pain Type  Acute pain    Pain Onset  More than a month ago    Pain Frequency  Intermittent    Aggravating Factors   reaching, ADLS, IADLS,     Pain Relieving Factors  none         [facility] PT Assessment - 11/25/18 0001      AROM   Right Shoulder Flexion  90 Degrees    Right Shoulder ABduction  80 Degrees      PROM   Right Shoulder Flexion  125 Degrees    Right Shoulder ABduction  125 Degrees    Right Shoulder  Internal Rotation  70 Degrees    Right Shoulder External Rotation  55 Degrees                   [facility] Adult PT Treatment/Exercise - 11/25/18 0001      Exercises   Exercises  Shoulder      Shoulder Exercises: Supine   External Rotation  --    External Rotation Limitations  --    Flexion  AAROM;15 reps    Flexion Limitations  cane    Other Supine Exercises  --      Shoulder Exercises: Standing   Row  20 reps    Theraband Level (Shoulder Row)  Level 2 (Red)    Other Standing Exercises  wall slides 2 UE, x15;        Shoulder Exercises: Pulleys   Flexion  2 minutes    Scaption  1 minute      Shoulder Exercises: Stretch   Corner Stretch  3 reps;30 seconds    Corner Stretch Limitations  45 deg    Internal Rotation Stretch  5 reps    Internal Rotation Stretch Limitations  stick behind back    Table Stretch -Flexion Limitations  standing at counter x5, 10 sec holds    Other Shoulder Stretches  Supine ER butterfly stretch 10 sec x10;       Manual Therapy    Manual Therapy  Passive ROM;Joint mobilization    Joint Mobilization  GHJ mobs posterior and inferior     Passive ROM   R GHJ all motions, R elbow              PT Education - 11/25/18 1108    Education Details  HEP discussed    Person(s) Educated  Patient    Methods  Explanation    Comprehension  Verbalized understanding       PT Short Term Goals - 11/25/18 1108      PT SHORT TERM GOAL #1   Title  Pt to report decreased pain in R Shoulder to 4/10     Time  2    Period  Weeks    Status  Achieved    Target Date  11/25/18      PT SHORT TERM GOAL #2   Title  Pt to demo improved PROM of R shoulder, by at least 20 degrees for flexion and abd.     Time  2    Period  Weeks    Status  Achieved    Target Date  11/25/18        PT Long Term Goals - 11/15/18 2155      PT LONG TERM GOAL #1   Title  Pt to demo improved PROM to be WNL    Time  6    Period  Weeks    Status  New    Target Date  12/23/18      PT LONG TERM GOAL #2   Title  Pt to demo improved AROM of R shoulder, to be [facility], to improve ability for reaching, and ADLs.     Time  6    Period  Weeks    Status  New    Target Date  12/23/18      PT LONG TERM GOAL #3   Title  Pt to demo improved strength of R shoulder, to at least 4/5 to improve ability for reaching, lifting, and IADLs.     Time  6  Period  Weeks    Status  New    Target Date  12/23/18      PT LONG TERM GOAL #4   Title  Pt to report decreased pain to 0-2/10 with activity and AROM.     Time  6    Period  Weeks    Status  New    Target Date  12/23/18            Plan - 11/25/18 1109    Clinical Impression Statement  Pt with significant soreness at end of available range for elevation and ER. All motions improving. Pt with improved abilty for IR behind the back today.     Rehab Potential  Good    PT Frequency  2x / week    PT Duration  6 weeks    PT Treatment/Interventions  ADLs/Self Care Home Management;Cryotherapy;Electrical  Stimulation;Iontophoresis 4mg /ml Dexamethasone;Functional mobility training;Ultrasound;Moist Heat;Therapeutic activities;Therapeutic exercise;Neuromuscular re-education;Patient/family education;Passive range of motion;Manual techniques;Dry needling;Taping;Vasopneumatic Device;Joint Manipulations;Spinal Manipulations    Consulted and Agree with Plan of Care  Patient       Patient will benefit from skilled therapeutic intervention in order to improve the following deficits and impairments:  Abnormal gait, Decreased range of motion, Impaired UE functional use, Increased muscle spasms, Decreased endurance, Decreased activity tolerance, Pain, Improper body mechanics, Impaired flexibility, Hypomobility, Postural dysfunction, Decreased strength, Decreased mobility  Visit Diagnosis: Chronic right shoulder pain  Right elbow pain     Problem List Patient Active Problem List   Diagnosis Date Noted  . Insomnia due to medical condition 10/18/2018  . Elevated ALT measurement 10/18/2018  . Insulin resistance 10/12/2018  . Chronic right shoulder pain 09/11/2018  . Myofascial pain syndrome 07/17/2018  . Lateral epicondylitis of right elbow 06/25/2018  . Radiculitis 05/25/2018  . Arthritis of right hip 03/30/2018  . Breast nodule 03/30/2018  . Diverticulosis 03/30/2018  . GERD (gastroesophageal reflux disease) 03/30/2018  . Nonerosive nonspecific gastritis 03/30/2018  . Hypertension 03/01/2018  . Hyperlipidemia 03/01/2018  . Acquired hypothyroidism 03/01/2018  . Vertigo 03/01/2018  . Frequent PVCs 03/01/2018    Lyndee Hensen, PT, DPT 11:10 AM  11/25/18    [facility] [place], [place], [address] Phone: [phone]   Fax:  [phone]  Name: Ladoris Lythgoe MRN: [id number] Date of Birth: 1951-01-17

## 2018-11-28 ENCOUNTER — Encounter: Payer: Self-pay | Admitting: Family Medicine

## 2018-11-30 ENCOUNTER — Other Ambulatory Visit: Payer: Self-pay

## 2018-11-30 ENCOUNTER — Encounter: Payer: Self-pay | Admitting: Physical Therapy

## 2018-11-30 ENCOUNTER — Ambulatory Visit: Payer: PPO | Admitting: Physical Therapy

## 2018-11-30 DIAGNOSIS — G8929 Other chronic pain: Secondary | ICD-10-CM

## 2018-11-30 DIAGNOSIS — M25511 Pain in right shoulder: Secondary | ICD-10-CM | POA: Diagnosis not present

## 2018-11-30 DIAGNOSIS — M25521 Pain in right elbow: Secondary | ICD-10-CM

## 2018-11-30 MED ORDER — LEVOTHYROXINE SODIUM 125 MCG PO TABS: 125.0000 ug | ORAL_TABLET | Freq: Every day | ORAL | 3 refills | Status: DC

## 2018-11-30 NOTE — Therapy (Signed)
[facility] [address] [place], [place], [address] Phone: [phone]   Fax:  [phone]  Physical Therapy Treatment  Patient Details  Name: Mary Hernandez MRN: [id number] Date of Birth: 22-Apr-1951 Referring Provider (PT): Teresa Coombs   Encounter Date: 11/30/2018  PT End of Session - 11/30/18 1334    Visit Number  6    Number of Visits  12    Date for PT Re-Evaluation  12/23/18    Authorization Type  HTA    PT Start Time  1302    PT Stop Time  1342    PT Time Calculation (min)  40 min    Activity Tolerance  Patient tolerated treatment well;Patient limited by pain    Behavior During Therapy  [facility] for tasks assessed/performed       Past Medical History:  Diagnosis Date  . Acquired hypothyroidism 03/01/2018   hx of goiter; s/p thyroidectomy  . Anxiety   . Atrial premature depolarization   . BPPV (benign paroxysmal positional vertigo) 03/01/2018  . Colon polyps   . Essential hypertension 03/01/2018   denies hx - on diuretic for Meniere's and beta blocker for PVCs  . Frequent PVCs 03/01/2018   Controlled on Flecainide, Acebutolol  . GERD (gastroesophageal reflux disease)   . History of cardiac catheterization    Nuc 7/16:  normal perfusion; EF 76 // LHC 8/16:  normal coronary arteries  . History of depression   . History of dizziness   . History of echocardiogram    Echo 03/2006:  Normal LVEF  . HLD (hyperlipidemia)   . Hypothyroidism   . IBS (irritable bowel syndrome)   . Insulin resistance   . Meniere's disease     Past Surgical History:  Procedure Laterality Date  . BLADDER SURGERY  1997  . BREAST BIOPSY  2013  . BREAST REDUCTION SURGERY  1999  . CARDIAC CATHETERIZATION  05/2015  . Carpel Tunnel Release    . CHOLECYSTECTOMY  2011  . COLONOSCOPY  08/20/2015  . ESOPHAGOGASTRODUODENOSCOPY  01/13/2017  . [place]  . REDUCTION MAMMAPLASTY    . REFRACTIVE SURGERY    . S/P Eye Right 1967   Muscle  Clipped   . THYROIDECTOMY  2012  . TONSILLECTOMY  1975  . TOTAL ABDOMINAL HYSTERECTOMY  1996    There were no vitals filed for this visit.  Subjective Assessment - 11/30/18 1333    Subjective  Pt states she is doing a little better all the time.     Currently in Pain?  Yes    Pain Score  5     Pain Location  Shoulder    Pain Orientation  Right    Pain Descriptors / Indicators  Aching    Pain Type  Acute pain    Pain Onset  More than a month ago    Pain Frequency  Intermittent                       [facility] Adult PT Treatment/Exercise - 11/30/18 1304      Exercises   Exercises  Shoulder      Shoulder Exercises: Supine   Protraction  20 reps    Protraction Limitations  cane, SA punch    External Rotation  AAROM;20 reps    External Rotation Limitations  cane    Flexion  AAROM;20 reps    Flexion Limitations  cane    Other Supine Exercises  90/90 small  range up/down and L/R x15 each;       Shoulder Exercises: Standing   Row  20 reps    Theraband Level (Shoulder Row)  Level 2 (Red)    Other Standing Exercises  wall slides 1 UE, x15;        Shoulder Exercises: Pulleys   Flexion  2 minutes    Scaption  1 minute      Shoulder Exercises: Chemical engineer Limitations  --    Internal Rotation Stretch  --    Internal Rotation Stretch Limitations  --    Table Stretch -Flexion Limitations  --    Other Shoulder Stretches  Supine ER butterfly stretch 10 sec x10;       Modalities   Modalities  Iontophoresis      Iontophoresis   Type of Iontophoresis  Dexamethasone    Location  R lateral elbow    Time  4-6 hour patch      Manual Therapy   Manual Therapy  Passive ROM;Joint mobilization;Soft tissue mobilization    Joint Mobilization  GHJ mobs posterior and inferior     Soft tissue mobilization  DTM/IASTM to R lateral elbow     Passive ROM   R GHJ all motions, R elbow                PT Short Term Goals - 11/25/18 1108       PT SHORT TERM GOAL #1   Title  Pt to report decreased pain in R Shoulder to 4/10     Time  2    Period  Weeks    Status  Achieved    Target Date  11/25/18      PT SHORT TERM GOAL #2   Title  Pt to demo improved PROM of R shoulder, by at least 20 degrees for flexion and abd.     Time  2    Period  Weeks    Status  Achieved    Target Date  11/25/18        PT Long Term Goals - 11/15/18 2155      PT LONG TERM GOAL #1   Title  Pt to demo improved PROM to be WNL    Time  6    Period  Weeks    Status  New    Target Date  12/23/18      PT LONG TERM GOAL #2   Title  Pt to demo improved AROM of R shoulder, to be [facility], to improve ability for reaching, and ADLs.     Time  6    Period  Weeks    Status  New    Target Date  12/23/18      PT LONG TERM GOAL #3   Title  Pt to demo improved strength of R shoulder, to at least 4/5 to improve ability for reaching, lifting, and IADLs.     Time  6    Period  Weeks    Status  New    Target Date  12/23/18      PT LONG TERM GOAL #4   Title  Pt to report decreased pain to 0-2/10 with activity and AROM.     Time  6    Period  Weeks    Status  New    Target Date  12/23/18            Plan - 11/30/18 1334  Clinical Impression Statement  Pt with quite a bit of soreness with PROM today, most limited and painful for ER. She is improivng with ability for ther ex and improved AAROM. Pain is limiting factor, will progres as able .    Rehab Potential  Good    PT Frequency  2x / week    PT Duration  6 weeks    PT Treatment/Interventions  ADLs/Self Care Home Management;Cryotherapy;Electrical Stimulation;Iontophoresis 4mg /ml Dexamethasone;Functional mobility training;Ultrasound;Moist Heat;Therapeutic activities;Therapeutic exercise;Neuromuscular re-education;Patient/family education;Passive range of motion;Manual techniques;Dry needling;Taping;Vasopneumatic Device;Joint Manipulations;Spinal Manipulations    Consulted and Agree with Plan of  Care  Patient       Patient will benefit from skilled therapeutic intervention in order to improve the following deficits and impairments:  Abnormal gait, Decreased range of motion, Impaired UE functional use, Increased muscle spasms, Decreased endurance, Decreased activity tolerance, Pain, Improper body mechanics, Impaired flexibility, Hypomobility, Postural dysfunction, Decreased strength, Decreased mobility  Visit Diagnosis: Chronic right shoulder pain  Right elbow pain     Problem List Patient Active Problem List   Diagnosis Date Noted  . Insomnia due to medical condition 10/18/2018  . Elevated ALT measurement 10/18/2018  . Insulin resistance 10/12/2018  . Chronic right shoulder pain 09/11/2018  . Myofascial pain syndrome 07/17/2018  . Lateral epicondylitis of right elbow 06/25/2018  . Radiculitis 05/25/2018  . Arthritis of right hip 03/30/2018  . Breast nodule 03/30/2018  . Diverticulosis 03/30/2018  . GERD (gastroesophageal reflux disease) 03/30/2018  . Nonerosive nonspecific gastritis 03/30/2018  . Hypertension 03/01/2018  . Hyperlipidemia 03/01/2018  . Acquired hypothyroidism 03/01/2018  . Vertigo 03/01/2018  . Frequent PVCs 03/01/2018     Lyndee Hensen, PT, DPT 1:58 PM  11/30/18   [facility] [place], [place], [address] Phone: [phone]   Fax:  [phone]  Name: Mary Hernandez MRN: [id number] Date of Birth: [birth year]

## 2018-12-01 ENCOUNTER — Encounter: Payer: Self-pay | Admitting: Sports Medicine

## 2018-12-01 ENCOUNTER — Ambulatory Visit (INDEPENDENT_AMBULATORY_CARE_PROVIDER_SITE_OTHER): Payer: PPO | Admitting: Sports Medicine

## 2018-12-01 VITALS — BP 112/72 | HR 79 | Ht 65.0 in | Wt 177.4 lb

## 2018-12-01 DIAGNOSIS — M25511 Pain in right shoulder: Secondary | ICD-10-CM

## 2018-12-01 DIAGNOSIS — M25521 Pain in right elbow: Secondary | ICD-10-CM | POA: Diagnosis not present

## 2018-12-01 DIAGNOSIS — G8929 Other chronic pain: Secondary | ICD-10-CM | POA: Diagnosis not present

## 2018-12-01 DIAGNOSIS — M9902 Segmental and somatic dysfunction of thoracic region: Secondary | ICD-10-CM | POA: Diagnosis not present

## 2018-12-01 DIAGNOSIS — M9901 Segmental and somatic dysfunction of cervical region: Secondary | ICD-10-CM

## 2018-12-01 DIAGNOSIS — M9905 Segmental and somatic dysfunction of pelvic region: Secondary | ICD-10-CM

## 2018-12-01 DIAGNOSIS — M75101 Unspecified rotator cuff tear or rupture of right shoulder, not specified as traumatic: Secondary | ICD-10-CM | POA: Diagnosis not present

## 2018-12-01 DIAGNOSIS — M9907 Segmental and somatic dysfunction of upper extremity: Secondary | ICD-10-CM

## 2018-12-01 DIAGNOSIS — M9904 Segmental and somatic dysfunction of sacral region: Secondary | ICD-10-CM

## 2018-12-01 DIAGNOSIS — M7711 Lateral epicondylitis, right elbow: Secondary | ICD-10-CM

## 2018-12-01 DIAGNOSIS — M7918 Myalgia, other site: Secondary | ICD-10-CM | POA: Diagnosis not present

## 2018-12-01 NOTE — Patient Instructions (Addendum)

## 2018-12-01 NOTE — Progress Notes (Signed)
Mary Hernandez. Mary Hernandez, [facility]  Mary Hernandez - 68 y.o. female MRN [id number]  Date of birth: May 15, 1951  Visit Date: December 01, 2018  PCP: Mary Deutscher, DO   Referred by: Mary Deutscher, DO  SUBJECTIVE:  Chief Complaint  Patient presents with  . Left Elbow - Follow-up    MRI 08/22/18. PRP 10/05/18. Going to PT. Taking Amitriptyline 25 mg and Gabapentin.   . Left Shoulder - Follow-up    MRI 08/22/18. PRP 10/05/18. Going to PT. Taking Amitriptyline 25 mg and Gabapentin.     HPI: Patient is here for routine follow-up after PRP injections as above.  She is started working with physical therapy and is having good improvements but is quite sore from interventions yesterday.  She did have iontophoresis performed on her elbow yesterday and this was helpful and she tolerated it well.  She is overall happy with her progress and she is back to doing normal day-to-day activities including washing her hair and driving.  She is continued to have neck stiffness and pain with underlying scapular dyskinesis.  REVIEW OF SYSTEMS: No significant nighttime awakenings due to this issue.  Only intermittent discomfort. Denies fevers, chills, recent weight gain or weight loss.  No night sweats.  Pt denies any change in bowel or bladder habits, muscle weakness, numbness or falls associated with this pain.  HISTORY:  Prior history reviewed and updated per electronic medical record.  Patient Active Problem List   Diagnosis Date Noted  . Insomnia due to medical condition 10/18/2018  . Elevated ALT measurement 10/18/2018  . Insulin resistance 10/12/2018  . Chronic right shoulder pain 09/11/2018    MRI R shoulder 08/22/18: IMPRESSION: 1. Calcific tendinosis and low-grade partial-thickness bursal surface tear of the distal anterior supraspinatus tendon at the footprint. 2. Moderate acromioclavicular osteoarthritis.  10/05/18 - PRP inj   . Myofascial pain syndrome 07/17/2018  . Lateral epicondylitis of right elbow 06/25/2018    MRI R elbow 08/22/18: IMPRESSION: 1. Partial tear of the common forearm extensor tendon origin.  09/25/18 - PRP inj    . Radiculitis 05/25/2018  . Arthritis of right hip 03/30/2018  . Breast nodule 03/30/2018    Right breast. Benign. Noted 01/14/13.   . Diverticulosis 03/30/2018  . GERD (gastroesophageal reflux disease) 03/30/2018  . Nonerosive nonspecific gastritis 03/30/2018    Noted on EGD 01/2017.   [facility] [facility] Hypertension 03/01/2018  . Hyperlipidemia 03/01/2018  . Acquired hypothyroidism 03/01/2018  . Vertigo 03/01/2018  . Frequent PVCs 03/01/2018   Social History   Occupational History  . Occupation: Retired  Tobacco Use  . Smoking status: Never Smoker  . Smokeless tobacco: Never Used  Substance and Sexual Activity  . Alcohol use: Yes    Comment: very limited   . Drug use: Never  . Sexual activity: Not on file   Social History   Social History Narrative   Retired Licensed conveyancer   Born [place] in [place] in [place] for 23 years   Moved to [place] in 2019   Twin daughters     OBJECTIVE:  VS:  HT:5\' 5"  (165.1 cm)   WT:177 lb 6.4 oz (80.5 kg)  BMI:29.52    BP:112/72  HR:79bpm  TEMP: ( )  RESP:97 %   PHYSICAL EXAM: Adult female. No acute distress.  Alert and appropriate. Overhead range of motion is markedly limited on the right compared to the  left.  She continues to have generalized tenderness within the pectoralis and periscapular regions.  Intrinsic rotator cuff strength is diminished but markedly improved compared to the past.  She has antigravity with empty can testing, speeds testing and O'Brien's.  Consistently painful Hawkins test.  Tenderness over the lateral epicondyle.  She does have underlying hip pain as well.  Slightly restricted internal and external rotation of her bilateral legs.  Negative straight leg raise bilaterally.    ASSESSMENT:   1. Right elbow pain   2. Chronic right shoulder pain   3. Rotator cuff syndrome of right shoulder   4. Lateral epicondylitis of right elbow   5. Myofascial pain syndrome   6. Somatic dysfunction of cervical region   7. Somatic dysfunction of thoracic region   8. Somatic dysfunction of upper extremity   9. Somatic dysfunction of pelvis region   10. Somatic dysfunction of sacral region     PROCEDURES:  PROCEDURE NOTE : OSTEOPATHIC MANIPULATION The decision today to treat with Osteopathic Manipulative Therapy (OMT) was based on physical exam findings. Verbal consent was obtained following a discussion with the patient regarding the of risks, benefits and potential side effects, including an acute pain flare,post manipulation soreness and need for repeat treatments. Minimal risk of injury to neurovascular structures with cervical manipulation previously discussed in detailed and reaffirmed today.   Contraindications to OMT: NONE  Manipulation was performed as below: [facility] Treated & Osteopathic Exam Findings  CERVICAL SPINE: OA - rotated right C3 Flexed, rotated LEFT, sidebent RIGHT C5 Extended, rotated RIGHT, sidebent LEFT THORACIC SPINE:  T2 - 4 Neutral, rotated RIGHT, sidebent LEFT T6 - 8 Neutral, rotated LEFT, sidebent RIGHT PELVIS:  Right psoas spasm Right anterior innonimate SACRUM:  L on L sacral torsion UPPER EXTREMITIES: UPPER EXTREM: Right pec minor tenderpoint Right supraspinatous tenderpoint Right trapezius tenderpoint   OMT Techniques Used  HVLA muscle energy myofascial release HVLA - Long Lever    The patient tolerated the treatment well and reported Improved symptoms following treatment today. Patient was given medications, exercises, stretches and lifestyle modifications per AVS and verbally.     PLAN:  Pertinent additional documentation may be included in corresponding procedure notes, imaging studies, problem based documentation and  patient instructions.  No problem-specific Assessment & Plan notes found for this encounter.   Overall she is continue to make steady progress.  At this time her scapular dyskinesis is still limiting her progress.  She is also having continued ongoing pain in her hips and back and she responded well to osteopathic manipulation.  We did perform kinesiotaping on her shoulder with retraction from the supraclavicular fossa to the tip of the left scapula.  Links to [employer] provided today per Patient Instructions.  These exercises were developed by Minerva Ends, DC with a strong emphasis on core neuromuscular reducation and postural realignment through body-weight exercises.  Continue working with physical therapy  Discussed the underlying features of tight hip flexors leading to crouched, fetal like position that results in spinal column compression.  Including lumbar hyperflexion with hypermobility, thoracic flexion with restrictive rotation and cervical lordosis reversal.   Osteopathic manipulation was performed today based on physical exam findings.  Patient has responded well to osteopathic manipulation previously the prior manipulation did not provide permanent long lasting relief.  The patient does feel as though there was significant benefit to the prior manipulation and they wished for repeat manipulation today.  They understand that home therapeutic exercises are critical part of  the healing/treatment process and will continue with self treatment between now and their next visit as outlined.  The patient understands that the frequency of visits is meant to provide a stimulus to promote the body's own ability to heal and is not meant to be the sole means for improvement in their symptoms.  Activity modifications and the importance of avoiding exacerbating activities (limiting pain to no more than a 4 / 10 during or following activity) recommended and discussed.  Discussed  red flag symptoms that warrant earlier emergent evaluation and patient voices understanding.   No orders of the defined types were placed in this encounter.  Lab Orders  No laboratory test(s) ordered today   Imaging Orders  No imaging studies ordered today   Referral Orders  No referral(s) requested today    At follow up will plan to consider: repeat osteopathic manipulation  Return in about 2 weeks (around 12/15/2018).          Gerda Diss, [facility] Physician

## 2018-12-02 ENCOUNTER — Encounter: Payer: Self-pay | Admitting: Physical Therapy

## 2018-12-02 ENCOUNTER — Ambulatory Visit: Payer: PPO | Admitting: Physical Therapy

## 2018-12-02 DIAGNOSIS — M25521 Pain in right elbow: Secondary | ICD-10-CM | POA: Diagnosis not present

## 2018-12-02 DIAGNOSIS — G8929 Other chronic pain: Secondary | ICD-10-CM

## 2018-12-02 DIAGNOSIS — M25511 Pain in right shoulder: Secondary | ICD-10-CM

## 2018-12-02 NOTE — Therapy (Signed)
[facility] [address] [place], [place], [address] Phone: [phone]   Fax:  [phone]  Physical Therapy Treatment  Patient Details  Name: Mary Hernandez MRN: [id number] Date of Birth: 11-19-1950 Referring Provider (PT): Teresa Coombs   Encounter Date: 12/02/2018  PT End of Session - 12/02/18 1414    Visit Number  7    Number of Visits  12    Date for PT Re-Evaluation  12/23/18    Authorization Type  HTA    PT Start Time  1302    PT Stop Time  1346    PT Time Calculation (min)  44 min    Activity Tolerance  Patient tolerated treatment well;Patient limited by pain    Behavior During Therapy  [facility] for tasks assessed/performed       Past Medical History:  Diagnosis Date  . Acquired hypothyroidism 03/01/2018   hx of goiter; s/p thyroidectomy  . Anxiety   . Atrial premature depolarization   . BPPV (benign paroxysmal positional vertigo) 03/01/2018  . Colon polyps   . Essential hypertension 03/01/2018   denies hx - on diuretic for Meniere's and beta blocker for PVCs  . Frequent PVCs 03/01/2018   Controlled on Flecainide, Acebutolol  . GERD (gastroesophageal reflux disease)   . History of cardiac catheterization    Nuc 7/16:  normal perfusion; EF 76 // LHC 8/16:  normal coronary arteries  . History of depression   . History of dizziness   . History of echocardiogram    Echo 03/2006:  Normal LVEF  . HLD (hyperlipidemia)   . Hypothyroidism   . IBS (irritable bowel syndrome)   . Insulin resistance   . Meniere's disease     Past Surgical History:  Procedure Laterality Date  . BLADDER SURGERY  1997  . BREAST BIOPSY  2013  . BREAST REDUCTION SURGERY  1999  . CARDIAC CATHETERIZATION  05/2015  . Carpel Tunnel Release    . CHOLECYSTECTOMY  2011  . COLONOSCOPY  08/20/2015  . ESOPHAGOGASTRODUODENOSCOPY  01/13/2017  . [place]  . REDUCTION MAMMAPLASTY    . REFRACTIVE SURGERY    . S/P Eye Right 1967   Muscle  Clipped   . THYROIDECTOMY  2012  . TONSILLECTOMY  1975  . TOTAL ABDOMINAL HYSTERECTOMY  1996    There were no vitals filed for this visit.  Subjective Assessment - 12/02/18 1413    Subjective  Pt sore today. Had MD follow up yesterday .     Currently in Pain?  Yes    Pain Score  5     Pain Location  Shoulder    Pain Orientation  Right    Pain Descriptors / Indicators  Aching    Pain Type  Acute pain    Pain Onset  More than a month ago    Pain Frequency  Intermittent                       [facility] Adult PT Treatment/Exercise - 12/02/18 1316      Exercises   Exercises  Shoulder      Shoulder Exercises: Supine   Protraction  20 reps    Protraction Limitations  cane, SA punch    External Rotation  AAROM;20 reps    External Rotation Limitations  cane    Flexion  AAROM;20 reps    Flexion Limitations  cane    Other Supine Exercises  90/90 small range up/down  and L/R x15 each;       Shoulder Exercises: Standing   Row  --    Theraband Level (Shoulder Row)  --    Other Standing Exercises  wall slides 1 UE, x20;        Shoulder Exercises: Pulleys   Flexion  2 minutes    Scaption  1 minute      Shoulder Exercises: Stretch   Other Shoulder Stretches  --      Modalities   Modalities  Iontophoresis      Iontophoresis   Type of Iontophoresis  --    Location  --    Time  --      Manual Therapy   Manual Therapy  Passive ROM;Joint mobilization;Soft tissue mobilization    Joint Mobilization  GHJ mobs posterior and inferior     Soft tissue mobilization  --    Passive ROM   R GHJ all motions, R elbow                PT Short Term Goals - 11/25/18 1108      PT SHORT TERM GOAL #1   Title  Pt to report decreased pain in R Shoulder to 4/10     Time  2    Period  Weeks    Status  Achieved    Target Date  11/25/18      PT SHORT TERM GOAL #2   Title  Pt to demo improved PROM of R shoulder, by at least 20 degrees for flexion and abd.     Time  2     Period  Weeks    Status  Achieved    Target Date  11/25/18        PT Long Term Goals - 11/15/18 2155      PT LONG TERM GOAL #1   Title  Pt to demo improved PROM to be WNL    Time  6    Period  Weeks    Status  New    Target Date  12/23/18      PT LONG TERM GOAL #2   Title  Pt to demo improved AROM of R shoulder, to be [facility], to improve ability for reaching, and ADLs.     Time  6    Period  Weeks    Status  New    Target Date  12/23/18      PT LONG TERM GOAL #3   Title  Pt to demo improved strength of R shoulder, to at least 4/5 to improve ability for reaching, lifting, and IADLs.     Time  6    Period  Weeks    Status  New    Target Date  12/23/18      PT LONG TERM GOAL #4   Title  Pt to report decreased pain to 0-2/10 with activity and AROM.     Time  6    Period  Weeks    Status  New    Target Date  12/23/18            Plan - 12/02/18 1415    Clinical Impression Statement  Pt sore today, increased stiffness with PROM today for all motions. Recommended that pt increase frequency of light PROM/AAROM at home with pulley and stick for improved ROM.     Rehab Potential  Good    PT Frequency  2x / week    PT Duration  6 weeks  PT Treatment/Interventions  ADLs/Self Care Home Management;Cryotherapy;Electrical Stimulation;Iontophoresis 4mg /ml Dexamethasone;Functional mobility training;Ultrasound;Moist Heat;Therapeutic activities;Therapeutic exercise;Neuromuscular re-education;Patient/family education;Passive range of motion;Manual techniques;Dry needling;Taping;Vasopneumatic Device;Joint Manipulations;Spinal Manipulations    Consulted and Agree with Plan of Care  Patient       Patient will benefit from skilled therapeutic intervention in order to improve the following deficits and impairments:  Abnormal gait, Decreased range of motion, Impaired UE functional use, Increased muscle spasms, Decreased endurance, Decreased activity tolerance, Pain, Improper body mechanics,  Impaired flexibility, Hypomobility, Postural dysfunction, Decreased strength, Decreased mobility  Visit Diagnosis: Chronic right shoulder pain  Right elbow pain     Problem List Patient Active Problem List   Diagnosis Date Noted  . Insomnia due to medical condition 10/18/2018  . Elevated ALT measurement 10/18/2018  . Insulin resistance 10/12/2018  . Chronic right shoulder pain 09/11/2018  . Myofascial pain syndrome 07/17/2018  . Lateral epicondylitis of right elbow 06/25/2018  . Radiculitis 05/25/2018  . Arthritis of right hip 03/30/2018  . Breast nodule 03/30/2018  . Diverticulosis 03/30/2018  . GERD (gastroesophageal reflux disease) 03/30/2018  . Nonerosive nonspecific gastritis 03/30/2018  . Hypertension 03/01/2018  . Hyperlipidemia 03/01/2018  . Acquired hypothyroidism 03/01/2018  . Vertigo 03/01/2018  . Frequent PVCs 03/01/2018    Lyndee Hensen, PT, DPT 2:16 PM  12/02/18    [facility] [place], [place], [address] Phone: [phone]   Fax:  [phone]  Name: Mary Hernandez MRN: [id number] Date of Birth: March 17, 1951

## 2018-12-07 ENCOUNTER — Encounter: Payer: Self-pay | Admitting: Physical Therapy

## 2018-12-07 ENCOUNTER — Ambulatory Visit: Payer: PPO | Admitting: Physical Therapy

## 2018-12-07 DIAGNOSIS — M25511 Pain in right shoulder: Secondary | ICD-10-CM

## 2018-12-07 DIAGNOSIS — G8929 Other chronic pain: Secondary | ICD-10-CM | POA: Diagnosis not present

## 2018-12-07 DIAGNOSIS — M25521 Pain in right elbow: Secondary | ICD-10-CM

## 2018-12-08 ENCOUNTER — Encounter: Payer: Self-pay | Admitting: Physical Therapy

## 2018-12-08 NOTE — Therapy (Signed)
[facility] [address] [place], [place], [address] Phone: [phone]   Fax:  [phone]  Physical Therapy Treatment  Patient Details  Name: Mary Hernandez MRN: [id number] Date of Birth: [birth year] Referring Provider (PT): Teresa Coombs   Encounter Date: 12/07/2018  PT End of Session - 12/07/18 1426    Visit Number  8    Number of Visits  12    Date for PT Re-Evaluation  12/23/18    Authorization Type  HTA    PT Start Time  1345    PT Stop Time  1432    PT Time Calculation (min)  47 min    Activity Tolerance  Patient tolerated treatment well;Patient limited by pain    Behavior During Therapy  [facility] for tasks assessed/performed       Past Medical History:  Diagnosis Date  . Acquired hypothyroidism 03/01/2018   hx of goiter; s/p thyroidectomy  . Anxiety   . Atrial premature depolarization   . BPPV (benign paroxysmal positional vertigo) 03/01/2018  . Colon polyps   . Essential hypertension 03/01/2018   denies hx - on diuretic for Meniere's and beta blocker for PVCs  . Frequent PVCs 03/01/2018   Controlled on Flecainide, Acebutolol  . GERD (gastroesophageal reflux disease)   . History of cardiac catheterization    Nuc 7/16:  normal perfusion; EF 76 // LHC 8/16:  normal coronary arteries  . History of depression   . History of dizziness   . History of echocardiogram    Echo 03/2006:  Normal LVEF  . HLD (hyperlipidemia)   . Hypothyroidism   . IBS (irritable bowel syndrome)   . Insulin resistance   . Meniere's disease     Past Surgical History:  Procedure Laterality Date  . BLADDER SURGERY  1997  . BREAST BIOPSY  2013  . BREAST REDUCTION SURGERY  1999  . CARDIAC CATHETERIZATION  05/2015  . Carpel Tunnel Release    . CHOLECYSTECTOMY  2011  . COLONOSCOPY  08/20/2015  . ESOPHAGOGASTRODUODENOSCOPY  01/13/2017  . [place]  . REDUCTION MAMMAPLASTY    . REFRACTIVE SURGERY    . S/P Eye Right 1967   Muscle  Clipped   . THYROIDECTOMY  2012  . TONSILLECTOMY  1975  . TOTAL ABDOMINAL HYSTERECTOMY  1996    There were no vitals filed for this visit.  Subjective Assessment - 12/07/18 1425    Subjective  Pt has been doing HEP     Currently in Pain?  Yes    Pain Score  5     Pain Location  Shoulder    Pain Orientation  Right    Pain Descriptors / Indicators  Aching    Pain Type  Acute pain    Pain Onset  More than a month ago    Pain Frequency  Intermittent                       [facility] Adult PT Treatment/Exercise - 12/07/18 1350      Exercises   Exercises  Shoulder      Shoulder Exercises: Supine   Protraction  --    Protraction Limitations  --    External Rotation  20 reps;AROM    External Rotation Limitations  --    Flexion  AAROM;20 reps    Flexion Limitations  cane    Other Supine Exercises  90/90 small range up/down 15 each;  Shoulder Exercises: Standing   Flexion  AROM;10 reps    Flexion Limitations  to 90 deg    ABduction  AROM;10 reps    ABduction Limitations  to 90 deg    Row  20 reps    Theraband Level (Shoulder Row)  Level 3 (Green)    Other Standing Exercises  wall slides 1 UE, x20;  wall circles x10 each;       Shoulder Exercises: Pulleys   Flexion  2 minutes    Scaption  1 minute      Shoulder Exercises: Stretch   Internal Rotation Stretch Limitations  stick behind back x15;       Modalities   Modalities  Iontophoresis      Manual Therapy   Manual Therapy  Passive ROM;Joint mobilization;Soft tissue mobilization    Joint Mobilization  GHJ mobs posterior and inferior     Passive ROM   R GHJ all motions, R elbow                PT Short Term Goals - 11/25/18 1108      PT SHORT TERM GOAL #1   Title  Pt to report decreased pain in R Shoulder to 4/10     Time  2    Period  Weeks    Status  Achieved    Target Date  11/25/18      PT SHORT TERM GOAL #2   Title  Pt to demo improved PROM of R shoulder, by at least 20 degrees for  flexion and abd.     Time  2    Period  Weeks    Status  Achieved    Target Date  11/25/18        PT Long Term Goals - 11/15/18 2155      PT LONG TERM GOAL #1   Title  Pt to demo improved PROM to be WNL    Time  6    Period  Weeks    Status  New    Target Date  12/23/18      PT LONG TERM GOAL #2   Title  Pt to demo improved AROM of R shoulder, to be [facility], to improve ability for reaching, and ADLs.     Time  6    Period  Weeks    Status  New    Target Date  12/23/18      PT LONG TERM GOAL #3   Title  Pt to demo improved strength of R shoulder, to at least 4/5 to improve ability for reaching, lifting, and IADLs.     Time  6    Period  Weeks    Status  New    Target Date  12/23/18      PT LONG TERM GOAL #4   Title  Pt to report decreased pain to 0-2/10 with activity and AROM.     Time  6    Period  Weeks    Status  New    Target Date  12/23/18            Plan - 12/08/18 2130    Clinical Impression Statement  Pt with improving ability for AROM. She continues to have stiffness and pain with PROM. Focus has been on improving PROM and joint stiffness. Pt with pain that is limiting PROM and further ROM.     Rehab Potential  Good    PT Frequency  2x / week  PT Duration  6 weeks    PT Treatment/Interventions  ADLs/Self Care Home Management;Cryotherapy;Electrical Stimulation;Iontophoresis 4mg /ml Dexamethasone;Functional mobility training;Ultrasound;Moist Heat;Therapeutic activities;Therapeutic exercise;Neuromuscular re-education;Patient/family education;Passive range of motion;Manual techniques;Dry needling;Taping;Vasopneumatic Device;Joint Manipulations;Spinal Manipulations    Consulted and Agree with Plan of Care  Patient       Patient will benefit from skilled therapeutic intervention in order to improve the following deficits and impairments:  Abnormal gait, Decreased range of motion, Impaired UE functional use, Increased muscle spasms, Decreased endurance, Decreased  activity tolerance, Pain, Improper body mechanics, Impaired flexibility, Hypomobility, Postural dysfunction, Decreased strength, Decreased mobility  Visit Diagnosis: Chronic right shoulder pain  Right elbow pain     Problem List Patient Active Problem List   Diagnosis Date Noted  . Insomnia due to medical condition 10/18/2018  . Elevated ALT measurement 10/18/2018  . Insulin resistance 10/12/2018  . Chronic right shoulder pain 09/11/2018  . Myofascial pain syndrome 07/17/2018  . Lateral epicondylitis of right elbow 06/25/2018  . Radiculitis 05/25/2018  . Arthritis of right hip 03/30/2018  . Breast nodule 03/30/2018  . Diverticulosis 03/30/2018  . GERD (gastroesophageal reflux disease) 03/30/2018  . Nonerosive nonspecific gastritis 03/30/2018  . Hypertension 03/01/2018  . Hyperlipidemia 03/01/2018  . Acquired hypothyroidism 03/01/2018  . Vertigo 03/01/2018  . Frequent PVCs 03/01/2018   Lyndee Hensen, PT, DPT 9:42 PM  12/08/18    [facility], [place], [address] Phone: [phone]   Fax:  [phone]  Name: Mary Hernandez MRN: [id number] Date of Birth: Jun 27, 1951

## 2018-12-09 ENCOUNTER — Ambulatory Visit: Payer: PPO | Admitting: Physical Therapy

## 2018-12-09 ENCOUNTER — Encounter: Payer: Self-pay | Admitting: Physical Therapy

## 2018-12-09 DIAGNOSIS — G8929 Other chronic pain: Secondary | ICD-10-CM

## 2018-12-09 DIAGNOSIS — M25511 Pain in right shoulder: Secondary | ICD-10-CM

## 2018-12-09 DIAGNOSIS — M25521 Pain in right elbow: Secondary | ICD-10-CM | POA: Diagnosis not present

## 2018-12-09 MED ORDER — AMITRIPTYLINE HCL 25 MG PO TABS: 25.0000 mg | ORAL_TABLET | Freq: Every day | ORAL | 3 refills | Status: DC

## 2018-12-09 NOTE — Therapy (Signed)
[facility] [address] [place], [place], [address] Phone: [phone]   Fax:  [phone]  Physical Therapy Treatment  Patient Details  Name: Mary Hernandez MRN: [id number] Date of Birth: 05/15/1951 Referring Provider (PT): Teresa Coombs   Encounter Date: 12/09/2018  PT End of Session - 12/09/18 1319    Visit Number  9    Number of Visits  12    Date for PT Re-Evaluation  12/23/18    Authorization Type  HTA    PT Start Time  1310    PT Stop Time  1350    PT Time Calculation (min)  40 min    Activity Tolerance  Patient tolerated treatment well;Patient limited by pain    Behavior During Therapy  [facility] for tasks assessed/performed       Past Medical History:  Diagnosis Date  . Acquired hypothyroidism 03/01/2018   hx of goiter; s/p thyroidectomy  . Anxiety   . Atrial premature depolarization   . BPPV (benign paroxysmal positional vertigo) 03/01/2018  . Colon polyps   . Essential hypertension 03/01/2018   denies hx - on diuretic for Meniere's and beta blocker for PVCs  . Frequent PVCs 03/01/2018   Controlled on Flecainide, Acebutolol  . GERD (gastroesophageal reflux disease)   . History of cardiac catheterization    Nuc 7/16:  normal perfusion; EF 76 // LHC 8/16:  normal coronary arteries  . History of depression   . History of dizziness   . History of echocardiogram    Echo 03/2006:  Normal LVEF  . HLD (hyperlipidemia)   . Hypothyroidism   . IBS (irritable bowel syndrome)   . Insulin resistance   . Meniere's disease     Past Surgical History:  Procedure Laterality Date  . BLADDER SURGERY  1997  . BREAST BIOPSY  2013  . BREAST REDUCTION SURGERY  1999  . CARDIAC CATHETERIZATION  05/2015  . Carpel Tunnel Release    . CHOLECYSTECTOMY  2011  . COLONOSCOPY  08/20/2015  . ESOPHAGOGASTRODUODENOSCOPY  01/13/2017  . [place]  . REDUCTION MAMMAPLASTY    . REFRACTIVE SURGERY    . S/P Eye Right 1967   Muscle  Clipped   . THYROIDECTOMY  2012  . TONSILLECTOMY  1975  . TOTAL ABDOMINAL HYSTERECTOMY  1996    There were no vitals filed for this visit.  Subjective Assessment - 12/09/18 1318    Subjective  Pt is sore today.     Currently in Pain?  Yes    Pain Score  7     Pain Location  Shoulder    Pain Orientation  Right    Pain Descriptors / Indicators  Aching    Pain Type  Acute pain    Pain Onset  More than a month ago    Pain Frequency  Intermittent    Aggravating Factors   reaching, ADLS, movement    Pain Score  2    Pain Location  Elbow    Pain Orientation  Right    Pain Descriptors / Indicators  Aching    Pain Type  Acute pain    Pain Onset  More than a month ago    Pain Frequency  Intermittent                       [facility] Adult PT Treatment/Exercise - 12/09/18 0001      Exercises   Exercises  Shoulder  Shoulder Exercises: Supine   External Rotation  20 reps;AROM    External Rotation Limitations  cane/standing    Flexion  AAROM;20 reps    Flexion Limitations  cane    Other Supine Exercises  AROM full range flexion x15;       Shoulder Exercises: Standing   Flexion  AROM;20 reps    Flexion Limitations  x10 to 90 deg, x10 full ROM    ABduction  AROM;20 reps    ABduction Limitations  x10 to 90 deg, x10 full ROM    Row  20 reps    Theraband Level (Shoulder Row)  Level 3 (Green)    Other Standing Exercises  wall slides 1 UE, x20;  wall circles x10 each;       Shoulder Exercises: Pulleys   Flexion  2 minutes    Scaption  1 minute      Shoulder Exercises: Stretch   Corner Stretch  3 reps;30 seconds    Corner Stretch Limitations  45 deg    Internal Rotation Stretch Limitations  stick behind back x15;       Modalities   Modalities  Iontophoresis      Manual Therapy   Manual Therapy  Passive ROM;Joint mobilization;Soft tissue mobilization    Joint Mobilization  GHJ mobs posterior and inferior     Passive ROM   R GHJ all motions, R elbow                 PT Short Term Goals - 11/25/18 1108      PT SHORT TERM GOAL #1   Title  Pt to report decreased pain in R Shoulder to 4/10     Time  2    Period  Weeks    Status  Achieved    Target Date  11/25/18      PT SHORT TERM GOAL #2   Title  Pt to demo improved PROM of R shoulder, by at least 20 degrees for flexion and abd.     Time  2    Period  Weeks    Status  Achieved    Target Date  11/25/18        PT Long Term Goals - 11/15/18 2155      PT LONG TERM GOAL #1   Title  Pt to demo improved PROM to be WNL    Time  6    Period  Weeks    Status  New    Target Date  12/23/18      PT LONG TERM GOAL #2   Title  Pt to demo improved AROM of R shoulder, to be [facility], to improve ability for reaching, and ADLs.     Time  6    Period  Weeks    Status  New    Target Date  12/23/18      PT LONG TERM GOAL #3   Title  Pt to demo improved strength of R shoulder, to at least 4/5 to improve ability for reaching, lifting, and IADLs.     Time  6    Period  Weeks    Status  New    Target Date  12/23/18      PT LONG TERM GOAL #4   Title  Pt to report decreased pain to 0-2/10 with activity and AROM.     Time  6    Period  Weeks    Status  New    Target Date  12/23/18  Plan - 12/09/18 1552    Clinical Impression Statement  Pt with improving ability for AROM. PROM slightly improved, but very stiff and sore today. Reinforced continuting stretching for elbow as well as frequent ROM for shoulder at home.     Rehab Potential  Good    PT Frequency  2x / week    PT Duration  6 weeks    PT Treatment/Interventions  ADLs/Self Care Home Management;Cryotherapy;Electrical Stimulation;Iontophoresis 4mg /ml Dexamethasone;Functional mobility training;Ultrasound;Moist Heat;Therapeutic activities;Therapeutic exercise;Neuromuscular re-education;Patient/family education;Passive range of motion;Manual techniques;Dry needling;Taping;Vasopneumatic Device;Joint Manipulations;Spinal  Manipulations    Consulted and Agree with Plan of Care  Patient       Patient will benefit from skilled therapeutic intervention in order to improve the following deficits and impairments:  Abnormal gait, Decreased range of motion, Impaired UE functional use, Increased muscle spasms, Decreased endurance, Decreased activity tolerance, Pain, Improper body mechanics, Impaired flexibility, Hypomobility, Postural dysfunction, Decreased strength, Decreased mobility  Visit Diagnosis: Chronic right shoulder pain  Right elbow pain     Problem List Patient Active Problem List   Diagnosis Date Noted  . Insomnia due to medical condition 10/18/2018  . Elevated ALT measurement 10/18/2018  . Insulin resistance 10/12/2018  . Chronic right shoulder pain 09/11/2018  . Myofascial pain syndrome 07/17/2018  . Lateral epicondylitis of right elbow 06/25/2018  . Radiculitis 05/25/2018  . Arthritis of right hip 03/30/2018  . Breast nodule 03/30/2018  . Diverticulosis 03/30/2018  . GERD (gastroesophageal reflux disease) 03/30/2018  . Nonerosive nonspecific gastritis 03/30/2018  . Hypertension 03/01/2018  . Hyperlipidemia 03/01/2018  . Acquired hypothyroidism 03/01/2018  . Vertigo 03/01/2018  . Frequent PVCs 03/01/2018   Lyndee Hensen, PT, DPT 3:53 PM  12/09/18    [place], [place], [address] Phone: [phone]   Fax:  [phone]  Name: Mary Hernandez MRN: [id number] Date of Birth: 09/30/1951

## 2018-12-13 ENCOUNTER — Other Ambulatory Visit: Payer: Self-pay | Admitting: Family Medicine

## 2018-12-13 ENCOUNTER — Encounter: Payer: Self-pay | Admitting: Sports Medicine

## 2018-12-13 DIAGNOSIS — I152 Hypertension secondary to endocrine disorders: Secondary | ICD-10-CM

## 2018-12-13 DIAGNOSIS — E1159 Type 2 diabetes mellitus with other circulatory complications: Secondary | ICD-10-CM

## 2018-12-13 DIAGNOSIS — I10 Essential (primary) hypertension: Principal | ICD-10-CM

## 2018-12-14 ENCOUNTER — Encounter: Payer: Self-pay | Admitting: Physical Therapy

## 2018-12-14 ENCOUNTER — Ambulatory Visit (INDEPENDENT_AMBULATORY_CARE_PROVIDER_SITE_OTHER): Payer: PPO | Admitting: Sports Medicine

## 2018-12-14 ENCOUNTER — Ambulatory Visit: Payer: PPO | Admitting: Physical Therapy

## 2018-12-14 ENCOUNTER — Encounter: Payer: Self-pay | Admitting: Sports Medicine

## 2018-12-14 ENCOUNTER — Encounter: Payer: PPO | Admitting: Physical Therapy

## 2018-12-14 VITALS — BP 120/76 | HR 76 | Ht 65.0 in | Wt 179.6 lb

## 2018-12-14 DIAGNOSIS — M9901 Segmental and somatic dysfunction of cervical region: Secondary | ICD-10-CM | POA: Diagnosis not present

## 2018-12-14 DIAGNOSIS — M25521 Pain in right elbow: Secondary | ICD-10-CM | POA: Diagnosis not present

## 2018-12-14 DIAGNOSIS — G8929 Other chronic pain: Secondary | ICD-10-CM | POA: Diagnosis not present

## 2018-12-14 DIAGNOSIS — M75101 Unspecified rotator cuff tear or rupture of right shoulder, not specified as traumatic: Secondary | ICD-10-CM

## 2018-12-14 DIAGNOSIS — M9902 Segmental and somatic dysfunction of thoracic region: Secondary | ICD-10-CM

## 2018-12-14 DIAGNOSIS — M7711 Lateral epicondylitis, right elbow: Secondary | ICD-10-CM | POA: Diagnosis not present

## 2018-12-14 DIAGNOSIS — M25511 Pain in right shoulder: Secondary | ICD-10-CM

## 2018-12-14 DIAGNOSIS — M9907 Segmental and somatic dysfunction of upper extremity: Secondary | ICD-10-CM

## 2018-12-14 DIAGNOSIS — M7918 Myalgia, other site: Secondary | ICD-10-CM

## 2018-12-14 NOTE — Progress Notes (Signed)
Mary Hernandez. [name], [name] [phone]  Mary Hernandez - 68 y.o. female MRN [id number]  Date of birth: 06/25/1951  Visit Date: December 14, 2018  PCP: [name], DO   Referred by: [name], DO  SUBJECTIVE:  Chief Complaint  Patient presents with  . Right Elbow - Follow-up    MRI R elbow 08/22/2018. PRP 10/05/2018. Has been going to PT. Taking Amitriptyline and Gabapentin.   . Right Shoulder - Follow-up    MRI R shoulder 08/22/2018. PRP 10/05/2018. Has been going to PT.     HPI: Patient is here for follow-up of her right shoulder pain and elbow pain.  She is continued to progress and do well with regaining her range of motion.  She is continue on the amitriptyline and gabapentin with good improvement.  She has an upcoming trip to [place] and is going to be with her grandchildren for almost 3 weeks.  She is noticing only minimal elbow symptoms.  REVIEW OF SYSTEMS: No significant nighttime awakenings due to this issue. Denies fevers, chills, recent weight gain or weight loss.  No night sweats.  Pt denies any change in bowel or bladder habits, muscle weakness, numbness or falls associated with this pain.  HISTORY:  Prior history reviewed and updated per electronic medical record.  Patient Active Problem List   Diagnosis Date Noted  . Insomnia due to medical condition 10/18/2018  . Elevated ALT measurement 10/18/2018  . Insulin resistance 10/12/2018  . Chronic right shoulder pain 09/11/2018    MRI R shoulder 08/22/18: IMPRESSION: 1. Calcific tendinosis and low-grade partial-thickness bursal surface tear of the distal anterior supraspinatus tendon at the footprint. 2. Moderate acromioclavicular osteoarthritis.  10/05/18 - PRP inj   . Myofascial pain syndrome 07/17/2018  . Lateral epicondylitis of right elbow 06/25/2018    MRI R elbow 08/22/18: IMPRESSION: 1. Partial tear of the common forearm extensor tendon  origin.  09/25/18 - PRP inj    . Radiculitis 05/25/2018  . Arthritis of right hip 03/30/2018  . Breast nodule 03/30/2018    Right breast. Benign. Noted 01/14/13.   . Diverticulosis 03/30/2018  . GERD (gastroesophageal reflux disease) 03/30/2018  . Nonerosive nonspecific gastritis 03/30/2018    Noted on EGD 01/2017.   [place] Hypertension 03/01/2018  . Hyperlipidemia 03/01/2018  . Acquired hypothyroidism 03/01/2018  . Vertigo 03/01/2018  . Frequent PVCs 03/01/2018   Social History   Occupational History  . Occupation: Retired  Tobacco Use  . Smoking status: Never Smoker  . Smokeless tobacco: Never Used  Substance and Sexual Activity  . Alcohol use: Yes    Comment: very limited   . Drug use: Never  . Sexual activity: Not on file   Social History   Social History Narrative   Retired Licensed conveyancer   Born [place] in [place] for 32 years   Moved to [place] in 2019   Twin daughters     OBJECTIVE:  VS:  HT:5\' 5"  (165.1 cm)   WT:179 lb 9.6 oz (81.5 kg)  BMI:29.89    BP:120/76  HR:76bpm  TEMP: ( )  RESP:97 %   PHYSICAL EXAM: Adult female. No acute distress.  Alert and appropriate. Right shoulder is markedly improved with good improvements in her overhead range of motion although still painful with Hawkins and Neer's testing.  External rotation and internal rotation strength is 4+/5. Elbow is only minimally  painful.  Otherwise functional limitations appreciated only.   ASSESSMENT:   1. Chronic right shoulder pain   2. Right elbow pain   3. Rotator cuff syndrome of right shoulder   4. Lateral epicondylitis of right elbow   5. Myofascial pain syndrome   6. Somatic dysfunction of cervical region   7. Somatic dysfunction of thoracic region   8. Somatic dysfunction of upper extremity     PROCEDURES:  PROCEDURE NOTE: OSTEOPATHIC MANIPULATION   The decision today to treat with Osteopathic Manipulative Therapy (OMT) was based  on physical exam findings. Verbal consent was obtained following a discussion with the patient regarding the of risks, benefits and potential side effects, including an acute pain flare,post manipulation soreness and need for repeat treatments.  NONE  Manipulation was performed as below:  [facility] Treated & Osteopathic Exam Findings   CERVICAL SPINE:  OA - rotated right C5 - 6 Extended, rotated RIGHT, sidebent RIGHT THORACIC SPINE:   T2 - 7 Neutral, rotated RIGHT, sidebent LEFT UPPER EXTREMITIES:  Right supraspinatous tenderpoint Right infraspinatus tenderpoint    OMT Techniques Used:  HVLA muscle energy myofascial release soft tissue    The patient tolerated the treatment well and reported Improved symptoms following treatment today. Patient was given medications, exercises, stretches and lifestyle modifications per AVS and verbally.       PLAN:  Pertinent additional documentation may be included in corresponding procedure notes, imaging studies, problem based documentation and patient instructions.  No problem-specific Assessment & Plan notes found for this encounter.   She is continuing to have good improvements.  At this point she will continue with her home therapeutic exercises while in [place] and we will plan to check back with her when she returns from her trip.  Can consider further dry needling which she is hesitant to do so.  Continue previously prescribed home exercise program.   Osteopathic manipulation was performed today based on physical exam findings.  Patient has responded well to osteopathic manipulation previously the prior manipulation did not provide permanent long lasting relief.  The patient does feel as though there was significant benefit to the prior manipulation and they wished for repeat manipulation today.  They understand that home therapeutic exercises are critical part of the healing/treatment process and will continue with self treatment between now  and their next visit as outlined.  The patient understands that the frequency of visits is meant to provide a stimulus to promote the body's own ability to heal and is not meant to be the sole means for improvement in their symptoms.  Activity modifications and the importance of avoiding exacerbating activities (limiting pain to no more than a 4 / 10 during or following activity) recommended and discussed.   Discussed red flag symptoms that warrant earlier emergent evaluation and patient voices understanding.    No orders of the defined types were placed in this encounter.  Lab Orders  No laboratory test(s) ordered today   Imaging Orders  No imaging studies ordered today   Referral Orders  No referral(s) requested today    At follow up will plan to consider: repeat osteopathic manipulation  Return in about 4 weeks (around 01/11/2019).          Gerda Diss, [facility] Physician

## 2018-12-15 ENCOUNTER — Ambulatory Visit: Payer: PPO | Admitting: Sports Medicine

## 2018-12-15 NOTE — Therapy (Addendum)
[facility] [address] [place], [place], [address] Phone: [phone]   Fax:  [phone]  Physical Therapy Treatment  Patient Details  Name: Mary Hernandez MRN: [id number] Date of Birth: 28-Aug-1951 Referring Provider (PT): Teresa Coombs   Encounter Date: 12/14/2018  PT End of Session - 12/14/18 1310    Visit Number  10    Number of Visits  12    Date for PT Re-Evaluation  12/23/18    Authorization Type  HTA    PT Start Time  8676    PT Stop Time  1345    PT Time Calculation (min)  42 min    Activity Tolerance  Patient tolerated treatment well;Patient limited by pain    Behavior During Therapy  [facility] for tasks assessed/performed       Past Medical History:  Diagnosis Date  . Acquired hypothyroidism 03/01/2018   hx of goiter; s/p thyroidectomy  . Anxiety   . Atrial premature depolarization   . BPPV (benign paroxysmal positional vertigo) 03/01/2018  . Colon polyps   . Essential hypertension 03/01/2018   denies hx - on diuretic for Meniere's and beta blocker for PVCs  . Frequent PVCs 03/01/2018   Controlled on Flecainide, Acebutolol  . GERD (gastroesophageal reflux disease)   . History of cardiac catheterization    Nuc 7/16:  normal perfusion; EF 76 // LHC 8/16:  normal coronary arteries  . History of depression   . History of dizziness   . History of echocardiogram    Echo 03/2006:  Normal LVEF  . HLD (hyperlipidemia)   . Hypothyroidism   . IBS (irritable bowel syndrome)   . Insulin resistance   . Meniere's disease     Past Surgical History:  Procedure Laterality Date  . BLADDER SURGERY  1997  . BREAST BIOPSY  2013  . BREAST REDUCTION SURGERY  1999  . CARDIAC CATHETERIZATION  05/2015  . Carpel Tunnel Release    . CHOLECYSTECTOMY  2011  . COLONOSCOPY  08/20/2015  . ESOPHAGOGASTRODUODENOSCOPY  01/13/2017  . [place]  . REDUCTION MAMMAPLASTY    . REFRACTIVE SURGERY    . S/P Eye Right 1967   Muscle  Clipped   . THYROIDECTOMY  2012  . TONSILLECTOMY  1975  . TOTAL ABDOMINAL HYSTERECTOMY  1996    There were no vitals filed for this visit.  Subjective Assessment - 12/14/18 1309    Subjective  Pt reports doing well. She is going out of town for 2 weeks. Will return when she gets back.     Currently in Pain?  Yes    Pain Score  3     Pain Location  Shoulder    Pain Orientation  Right    Pain Descriptors / Indicators  Aching    Pain Type  Acute pain    Pain Onset  More than a month ago    Pain Frequency  Intermittent    Aggravating Factors   reaching, ADLS, movement    Pain Score  2    Pain Location  Elbow    Pain Orientation  Right    Pain Descriptors / Indicators  Aching    Pain Type  Acute pain    Pain Onset  More than a month ago    Pain Frequency  Intermittent                       [facility] Adult PT Treatment/Exercise - 12/14/18  1311      Exercises   Exercises  Shoulder      Shoulder Exercises: Supine   External Rotation  --    External Rotation Limitations  --    Flexion  AAROM;20 reps    Flexion Limitations  cane    Other Supine Exercises  AROM full range flexion x15;       Shoulder Exercises: Standing   External Rotation  20 reps    Theraband Level (Shoulder External Rotation)  Level 2 (Red)    Internal Rotation  20 reps    Theraband Level (Shoulder Internal Rotation)  Level 2 (Red)    Flexion  --    Flexion Limitations  --    ABduction  AROM;20 reps    ABduction Limitations  x10 to 90 deg, x10 full ROM    Row  20 reps    Theraband Level (Shoulder Row)  Level 3 (Green)    Other Standing Exercises  wall slides 1 UE, x20;  wall circles x10 each;       Shoulder Exercises: Pulleys   Flexion  2 minutes    Scaption  1 minute      Shoulder Exercises: Stretch   Corner Stretch  --    Corner Stretch Limitations  --    Internal Rotation Stretch Limitations  --    Other Shoulder Stretches  Posterior shoulder stretch;       Modalities   Modalities   Iontophoresis      Manual Therapy   Manual Therapy  Passive ROM;Joint mobilization;Soft tissue mobilization    Joint Mobilization  GHJ mobs posterior and inferior     Passive ROM   R GHJ all motions, R elbow                PT Short Term Goals - 11/25/18 1108      PT SHORT TERM GOAL #1   Title  Pt to report decreased pain in R Shoulder to 4/10     Time  2    Period  Weeks    Status  Achieved    Target Date  11/25/18      PT SHORT TERM GOAL #2   Title  Pt to demo improved PROM of R shoulder, by at least 20 degrees for flexion and abd.     Time  2    Period  Weeks    Status  Achieved    Target Date  11/25/18        PT Long Term Goals - 11/15/18 2155      PT LONG TERM GOAL #1   Title  Pt to demo improved PROM to be WNL    Time  6    Period  Weeks    Status  New    Target Date  12/23/18      PT LONG TERM GOAL #2   Title  Pt to demo improved AROM of R shoulder, to be [facility], to improve ability for reaching, and ADLs.     Time  6    Period  Weeks    Status  New    Target Date  12/23/18      PT LONG TERM GOAL #3   Title  Pt to demo improved strength of R shoulder, to at least 4/5 to improve ability for reaching, lifting, and IADLs.     Time  6    Period  Weeks    Status  New    Target Date  12/23/18      PT LONG TERM GOAL #4   Title  Pt to report decreased pain to 0-2/10 with activity and AROM.     Time  6    Period  Weeks    Status  New    Target Date  12/23/18            Plan - 12/14/18 1355    Clinical Impression Statement  Pt with improving PROM and AROM, but continues to have stiffness and soreness with end ranges for all motions. Pain is limiting for PROM. Pt continuously educated on high frequency ROM at home. Pt to benefit from continued care.     Rehab Potential  Good    PT Frequency  2x / week    PT Duration  6 weeks    PT Treatment/Interventions  ADLs/Self Care Home Management;Cryotherapy;Electrical Stimulation;Iontophoresis 50m/ml  Dexamethasone;Functional mobility training;Ultrasound;Moist Heat;Therapeutic activities;Therapeutic exercise;Neuromuscular re-education;Patient/family education;Passive range of motion;Manual techniques;Dry needling;Taping;Vasopneumatic Device;Joint Manipulations;Spinal Manipulations    Consulted and Agree with Plan of Care  Patient       Patient will benefit from skilled therapeutic intervention in order to improve the following deficits and impairments:  Abnormal gait, Decreased range of motion, Impaired UE functional use, Increased muscle spasms, Decreased endurance, Decreased activity tolerance, Pain, Improper body mechanics, Impaired flexibility, Hypomobility, Postural dysfunction, Decreased strength, Decreased mobility  Visit Diagnosis: Chronic right shoulder pain  Right elbow pain     Problem List Patient Active Problem List   Diagnosis Date Noted  . Insomnia due to medical condition 10/18/2018  . Elevated ALT measurement 10/18/2018  . Insulin resistance 10/12/2018  . Chronic right shoulder pain 09/11/2018  . Myofascial pain syndrome 07/17/2018  . Lateral epicondylitis of right elbow 06/25/2018  . Radiculitis 05/25/2018  . Arthritis of right hip 03/30/2018  . Breast nodule 03/30/2018  . Diverticulosis 03/30/2018  . GERD (gastroesophageal reflux disease) 03/30/2018  . Nonerosive nonspecific gastritis 03/30/2018  . Hypertension 03/01/2018  . Hyperlipidemia 03/01/2018  . Acquired hypothyroidism 03/01/2018  . Vertigo 03/01/2018  . Frequent PVCs 03/01/2018   Mary Hernandez PT, DPT 10:44 AM  12/15/18    [facility] [place] [address]Phone: [phone]  Fax:  [phone] Name: GRacheal MathurinMRN: [id number] of Birth: 908-14-1952   PHYSICAL THERAPY DISCHARGE SUMMARY  Visits from Start of Care: 10   Plan: Patient agrees to discharge.  Patient goals were partially met. Patient is being  discharged due to not returning since the last visit.  ?????    Pt was out of town for a couple months, [place], [place] 1:25 PM  07/13/19

## 2018-12-17 ENCOUNTER — Encounter: Payer: Self-pay | Admitting: Sports Medicine

## 2018-12-28 ENCOUNTER — Encounter: Payer: Self-pay | Admitting: Family Medicine

## 2018-12-30 ENCOUNTER — Ambulatory Visit (INDEPENDENT_AMBULATORY_CARE_PROVIDER_SITE_OTHER): Payer: PPO | Admitting: Family Medicine

## 2018-12-30 ENCOUNTER — Encounter: Payer: Self-pay | Admitting: Family Medicine

## 2018-12-30 ENCOUNTER — Other Ambulatory Visit: Payer: Self-pay

## 2018-12-30 DIAGNOSIS — L03313 Cellulitis of chest wall: Secondary | ICD-10-CM

## 2018-12-30 MED ORDER — DOXYCYCLINE HYCLATE 100 MG PO TABS: 100.0000 mg | ORAL_TABLET | Freq: Two times a day (BID) | ORAL | 0 refills | Status: DC

## 2018-12-30 NOTE — Progress Notes (Signed)
Virtual Visit via Video   I connected with Mary Hernandez on 12/30/18 at 10:20 AM EDT by a video enabled telemedicine application and verified that I am speaking with the correct person using two identifiers. Location patient: Home Location provider: [facility], Office Persons participating in the virtual visit: Kellianne Ek, [name], DO, Francella Solian, CMA  I discussed the limitations of evaluation and management by telemedicine and the availability of in person appointments. The patient expressed understanding and agreed to proceed.  Subjective:  Mary Hernandez, CMA acting as scribe for Dr. [name].   HPI: Patient has area on left breast noticed two days ago. She is traveling in [place] and due to Covid is not sure how long she will be away. Area is the size of dime.It doesn't itch, it doesn't hurt, and it's relatively smooth. She has sent picture in mychart that has been reviewed. She denies any injury to area. She denies any streaking or tenderness or lymphadenopathy. No fever or other symptoms. She is not sure if she has been bit by anything.    ROS: See pertinent positives and negatives per HPI.  Patient Active Problem List   Diagnosis Date Noted  . Insomnia due to medical condition 10/18/2018  . Elevated ALT measurement 10/18/2018  . Insulin resistance 10/12/2018  . Chronic right shoulder pain 09/11/2018  . Myofascial pain syndrome 07/17/2018  . Lateral epicondylitis of right elbow 06/25/2018  . Radiculitis 05/25/2018  . Arthritis of right hip 03/30/2018  . Breast nodule 03/30/2018  . Diverticulosis 03/30/2018  . GERD (gastroesophageal reflux disease) 03/30/2018  . Nonerosive nonspecific gastritis 03/30/2018  . Hypertension 03/01/2018  . Hyperlipidemia 03/01/2018  . Acquired hypothyroidism 03/01/2018  . Vertigo 03/01/2018  . Frequent PVCs 03/01/2018    Social History   Tobacco Use  . Smoking status: Never Smoker  . Smokeless tobacco: Never Used    Substance Use Topics  . Alcohol use: Yes    Comment: very limited     Current Outpatient Medications:  .  acebutolol (SECTRAL) 200 MG capsule, Take 1 capsule (200 mg total) by mouth daily., Disp: 90 capsule, Rfl: 3 .  amitriptyline (ELAVIL) 25 MG tablet, Take 1 tablet (25 mg total) by mouth at bedtime., Disp: 30 tablet, Rfl: 3 .  Calcium Citrate-Vitamin D 315-250 MG-UNIT TABS, Take by mouth., Disp: , Rfl:  .  celecoxib (CELEBREX) 100 MG capsule, Take 1 capsule (100 mg total) by mouth 2 (two) times daily., Disp: 60 capsule, Rfl: 3 .  cetirizine (ZYRTEC) 10 MG tablet, Take 10 mg by mouth daily., Disp: , Rfl:  .  DYAZIDE 37.5-25 MG capsule, TAKE ONE CAPSULE BY MOUTH DAILY. MUST USE BRAND NAME, Disp: 90 capsule, Rfl: 2 .  EVENING PRIMROSE OIL PO, Take 1,500 mg by mouth 2 (two) times daily., Disp: , Rfl:  .  flecainide (TAMBOCOR) 50 MG tablet, Take 1.5 tablets (75 mg total) by mouth 2 (two) times daily., Disp: 270 tablet, Rfl: 3 .  fluticasone (FLONASE) 50 MCG/ACT nasal spray, Place 2 sprays into both nostrils daily., Disp: 16 g, Rfl: 6 .  gabapentin (NEURONTIN) 100 MG capsule, Take 1 capsule (100 mg total) by mouth 3 (three) times daily., Disp: 90 capsule, Rfl: 3 .  Glucosamine-Chondroit-Vit C-Mn (GLUCOSAMINE 1500 COMPLEX) CAPS, Take by mouth., Disp: , Rfl:  .  glycopyrrolate (ROBINUL) 1 MG tablet, Take 1 tablet (1 mg total) by mouth 2 (two) times daily., Disp: 180 tablet, Rfl: 1 .  hydrocortisone (PROCTOZONE-HC) 2.5 % rectal  cream, Place 1 application rectally 2 (two) times daily., Disp: 30 g, Rfl: 3 .  levothyroxine (SYNTHROID, LEVOTHROID) 125 MCG tablet, Take 1 tablet (125 mcg total) by mouth daily before breakfast., Disp: 90 tablet, Rfl: 3 .  lidocaine (XYLOCAINE) 2 % jelly, Apply 1 application topically as needed., Disp: 30 mL, Rfl: 0 .  nitroGLYCERIN (NITRODUR - DOSED IN MG/24 HR) 0.2 mg/hr patch, Place 1/4 to 1/2 of a patch over affected region. Remove and replace once daily.  Slightly  alter skin placement daily, Disp: 30 patch, Rfl: 1 .  omega-3 acid ethyl esters (LOVAZA) 1 g capsule, Take 1 g by mouth 2 (two) times daily., Disp: , Rfl:  .  omeprazole (PRILOSEC) 40 MG capsule, Take 1 capsule (40 mg total) by mouth daily., Disp: 30 capsule, Rfl: 3 .  ondansetron (ZOFRAN) 8 MG tablet, Take by mouth every 8 (eight) hours as needed for nausea or vomiting., Disp: , Rfl:  .  potassium chloride (K-DUR,KLOR-CON) 10 MEQ tablet, Take 1 tablet (10 mEq total) by mouth 2 (two) times daily., Disp: 180 tablet, Rfl: 1 .  pravastatin (PRAVACHOL) 20 MG tablet, Take 1 tablet (20 mg total) by mouth daily., Disp: 90 tablet, Rfl: 3 .  Probiotic Product (PROBIOTIC-10 PO), Take by mouth., Disp: , Rfl:  .  Semaglutide, 1 MG/DOSE, (OZEMPIC, 1 MG/DOSE,) 2 MG/1.5ML SOPN, Inject 1 mg into the skin once a week., Disp: 6 pen, Rfl: 1 .  traZODone (DESYREL) 50 MG tablet, Take 0.5-1 tablets (25-50 mg total) by mouth at bedtime as needed for sleep., Disp: 30 tablet, Rfl: 3  Allergies  Allergen Reactions  . Adhesive [Tape] Rash    Objective:   VITALS: Per patient if applicable, see vitals. GENERAL: Alert, appears well and in no acute distress. HEENT: Atraumatic, conjunctiva clear, no obvious abnormalities on inspection of external nose and ears. NECK: Normal movements of the head and neck. CARDIOPULMONARY: No increased WOB. Speaking in clear sentences. I:E ratio WNL.  MS: Moves all visible extremities without noticeable abnormality. PSYCH: Pleasant and cooperative, well-groomed. Speech normal rate and rhythm. Affect is appropriate. Insight and judgement are appropriate. Attention is focused, linear, and appropriate.  NEURO: CN grossly intact. Oriented as arrived to appointment on time with no prompting. Moves both UE equally.  SKIN: No obvious lesions, wounds, erythema, or cyanosis noted on face or hands.  Assessment and Plan:   There are no diagnoses linked to this encounter.  . Reviewed expectations  re: course of current medical issues. . Discussed self-management of symptoms. . Outlined signs and symptoms indicating need for more acute intervention. . Patient verbalized understanding and all questions were answered. Marland [facility] Health Maintenance issues including appropriate healthy diet, exercise, and smoking avoidance were discussed with patient. . See orders for this visit as documented in the electronic medical record.  [name], DO 12/30/2018

## 2019-01-08 ENCOUNTER — Encounter: Payer: Self-pay | Admitting: Family Medicine

## 2019-01-08 ENCOUNTER — Encounter: Payer: Self-pay | Admitting: Sports Medicine

## 2019-01-08 ENCOUNTER — Other Ambulatory Visit: Payer: Self-pay

## 2019-01-08 DIAGNOSIS — E8881 Metabolic syndrome: Secondary | ICD-10-CM

## 2019-01-08 DIAGNOSIS — E1159 Type 2 diabetes mellitus with other circulatory complications: Secondary | ICD-10-CM

## 2019-01-08 DIAGNOSIS — G8929 Other chronic pain: Secondary | ICD-10-CM

## 2019-01-08 DIAGNOSIS — I152 Hypertension secondary to endocrine disorders: Secondary | ICD-10-CM

## 2019-01-08 DIAGNOSIS — M25511 Pain in right shoulder: Principal | ICD-10-CM

## 2019-01-08 DIAGNOSIS — I10 Essential (primary) hypertension: Secondary | ICD-10-CM

## 2019-01-08 MED ORDER — PRAVASTATIN SODIUM 20 MG PO TABS: 20.0000 mg | ORAL_TABLET | Freq: Every day | ORAL | 0 refills | Status: DC

## 2019-01-08 MED ORDER — DYAZIDE 37.5-25 MG PO CAPS: ORAL_CAPSULE | ORAL | 0 refills | Status: DC

## 2019-01-08 MED ORDER — SEMAGLUTIDE (1 MG/DOSE) 2 MG/1.5ML ~~LOC~~ SOPN: 1.0000 mg | PEN_INJECTOR | SUBCUTANEOUS | 0 refills | Status: DC

## 2019-01-11 ENCOUNTER — Ambulatory Visit: Payer: PPO | Admitting: Sports Medicine

## 2019-01-11 MED ORDER — AMITRIPTYLINE HCL 25 MG PO TABS: 25.0000 mg | ORAL_TABLET | Freq: Every day | ORAL | 1 refills | Status: DC

## 2019-01-11 MED ORDER — GABAPENTIN 100 MG PO CAPS: 100.0000 mg | ORAL_CAPSULE | Freq: Three times a day (TID) | ORAL | 1 refills | Status: DC

## 2019-01-12 ENCOUNTER — Encounter: Payer: PPO | Admitting: Physical Therapy

## 2019-02-02 ENCOUNTER — Encounter: Payer: Self-pay | Admitting: Family Medicine

## 2019-02-04 ENCOUNTER — Other Ambulatory Visit: Payer: Self-pay

## 2019-02-04 DIAGNOSIS — H6982 Other specified disorders of Eustachian tube, left ear: Secondary | ICD-10-CM

## 2019-02-04 DIAGNOSIS — R42 Dizziness and giddiness: Secondary | ICD-10-CM

## 2019-02-04 MED ORDER — HYDROCORTISONE 2.5 % RE CREA: 1.0000 "application " | TOPICAL_CREAM | Freq: Two times a day (BID) | RECTAL | 3 refills | Status: DC

## 2019-02-04 MED ORDER — FLUTICASONE PROPIONATE 50 MCG/ACT NA SUSP: 2.0000 | Freq: Every day | NASAL | 6 refills | Status: DC

## 2019-02-04 MED ORDER — GLYCOPYRROLATE 1 MG PO TABS: 1.0000 mg | ORAL_TABLET | Freq: Two times a day (BID) | ORAL | 1 refills | Status: DC

## 2019-02-04 MED ORDER — POTASSIUM CHLORIDE CRYS ER 10 MEQ PO TBCR: 10.0000 meq | EXTENDED_RELEASE_TABLET | Freq: Two times a day (BID) | ORAL | 1 refills | Status: DC

## 2019-02-04 MED ORDER — FLECAINIDE ACETATE 50 MG PO TABS: 75.0000 mg | ORAL_TABLET | Freq: Two times a day (BID) | ORAL | 0 refills | Status: DC

## 2019-02-05 ENCOUNTER — Other Ambulatory Visit: Payer: Self-pay | Admitting: Family Medicine

## 2019-02-05 ENCOUNTER — Other Ambulatory Visit: Payer: PPO

## 2019-02-05 DIAGNOSIS — R42 Dizziness and giddiness: Secondary | ICD-10-CM

## 2019-02-12 ENCOUNTER — Encounter: Payer: Self-pay | Admitting: Family Medicine

## 2019-02-12 ENCOUNTER — Telehealth: Payer: Self-pay | Admitting: Family Medicine

## 2019-02-12 ENCOUNTER — Ambulatory Visit (INDEPENDENT_AMBULATORY_CARE_PROVIDER_SITE_OTHER): Payer: PPO | Admitting: Family Medicine

## 2019-02-12 DIAGNOSIS — H04129 Dry eye syndrome of unspecified lacrimal gland: Secondary | ICD-10-CM | POA: Diagnosis not present

## 2019-02-12 DIAGNOSIS — H0289 Other specified disorders of eyelid: Secondary | ICD-10-CM

## 2019-02-12 MED ORDER — HYPROMELLOSE (GONIOSCOPIC) 2.5 % OP SOLN: 1.0000 [drp] | Freq: Four times a day (QID) | OPHTHALMIC | 12 refills | Status: DC | PRN

## 2019-02-12 MED ORDER — POLYMYXIN B-TRIMETHOPRIM 10000-0.1 UNIT/ML-% OP SOLN: 2.0000 [drp] | Freq: Four times a day (QID) | OPHTHALMIC | 0 refills | Status: DC

## 2019-02-12 NOTE — Telephone Encounter (Signed)
Forwarding to Dr. Juleen China to advise regarding alternative for Isopto tears.

## 2019-02-12 NOTE — Telephone Encounter (Signed)
Copied from [place] [id number]. Topic: General - Other >> Feb 12, 2019  3:21 PM Carolyn Stare wrote:  Pt call to say none of the pharmacy's has Isopto tears and is asking if there is something else she should try, they told this med is over the counter

## 2019-02-12 NOTE — Progress Notes (Signed)
    Chief Complaint:  Mary Hernandez is a 68 y.o. female who presents today for a virtual office visit with a chief complaint of dry eyes.   Assessment/Plan:  Dry Eyes No red flags.  Possibly early stye versus dry eyes.  We will send in a prescription for artificial tears.  Advised patient continue using warm compresses.  We will also send in prescription for Polytrim drops in case she develops stye or symptoms of blepharitis over the next few days.  Discussed reasons to seek emergent care.  Follow-up as needed.    Subjective:  HPI:  Dry Eyes  Symptoms started this morning.  Located in left upper eyelid.  Feels very irritated and dry.  She is currently in [place] and is been exposed to some dryer which she thinks is contributing.  She is tried using Systane eyedrops and gel which have not helped.  No vision changes.  No eye pain.  No drainage.  No sick contacts.  No fevers or chills.  Symptoms are predominantly located in the left eye.  Does not have any symptoms in the right eye.  No other obvious alleviating or aggravating factors.  Symptoms are overall stable.  ROS: Per HPI  PMH: She reports that she has never smoked. She has never used smokeless tobacco. She reports current alcohol use. She reports that she does not use drugs.      Objective/Observations  Physical Exam: Gen: NAD, resting comfortably EYE: Right eye normal.  Left eye with extraocular eye movements intact without pain.  No discharge.  No obvious lesions.  No scleral erythema or icterus. Pulm: Normal work of breathing Neuro: Grossly normal, moves all extremities Psych: Normal affect and thought content  Virtual Visit via Video   I connected with Mary Hernandez on 02/12/19 at  1:20 PM EDT by a video enabled telemedicine application and verified that I am speaking with the correct person using two identifiers. I discussed the limitations of evaluation and management by telemedicine and the availability of in person  appointments. The patient expressed understanding and agreed to proceed.   Patient location: Home Provider location: [place] participating in the virtual visit: Myself and Patient     Algis [place]. Jerline Pain, MD 02/12/2019 1:50 PM

## 2019-02-14 NOTE — Telephone Encounter (Signed)
Try Refresh eye drops. Preservative free is best. There is also a gel. Tell her to call her pharmacy to see what alternatives are in the store and they should set it aside for her.

## 2019-02-15 NOTE — Telephone Encounter (Signed)
Sent message via MyChart.

## 2019-02-22 ENCOUNTER — Telehealth: Payer: Self-pay | Admitting: Physical Therapy

## 2019-02-22 NOTE — Telephone Encounter (Signed)
LVM for pt letting her know PT services to resume at [facility].  Advised pt to return call and let us know if she would like to schedule a visit.  Laureen Abrahams, PT, DPT 02/22/19 2:42 PM   [facility] [address] [place], [place], [address] Phone: [phone]  Fax: [phone]

## 2019-04-10 ENCOUNTER — Other Ambulatory Visit: Payer: Self-pay | Admitting: Family Medicine

## 2019-04-15 ENCOUNTER — Encounter: Payer: Self-pay | Admitting: Family Medicine

## 2019-04-16 ENCOUNTER — Other Ambulatory Visit: Payer: Self-pay

## 2019-04-16 DIAGNOSIS — Z1231 Encounter for screening mammogram for malignant neoplasm of breast: Secondary | ICD-10-CM

## 2019-04-29 ENCOUNTER — Telehealth (INDEPENDENT_AMBULATORY_CARE_PROVIDER_SITE_OTHER): Payer: PPO | Admitting: Internal Medicine

## 2019-04-29 ENCOUNTER — Other Ambulatory Visit: Payer: Self-pay

## 2019-04-29 VITALS — BP 107/69 | HR 75 | Ht 65.0 in | Wt 170.0 lb

## 2019-04-29 DIAGNOSIS — I493 Ventricular premature depolarization: Secondary | ICD-10-CM | POA: Diagnosis not present

## 2019-04-29 DIAGNOSIS — R55 Syncope and collapse: Secondary | ICD-10-CM

## 2019-04-29 MED ORDER — FLECAINIDE ACETATE 50 MG PO TABS: 75.0000 mg | ORAL_TABLET | Freq: Two times a day (BID) | ORAL | 3 refills | Status: DC

## 2019-04-29 MED ORDER — ACEBUTOLOL HCL 200 MG PO CAPS: 200.0000 mg | ORAL_CAPSULE | Freq: Every day | ORAL | 3 refills | Status: DC

## 2019-04-29 NOTE — Progress Notes (Signed)
Electrophysiology TeleHealth Note   Due to national recommendations of social distancing due to COVID 19, an audio/video telehealth visit is felt to be most appropriate for this patient at this time.  See MyChart message from today for the patient's consent to telehealth for Mary Hernandez.   Date:  04/29/2019   ID:  Mary Hernandez, DOB 09-Aug-1951, MRN [id number]  Location: patient's home  Provider location: [address] [place]  Evaluation Performed: Follow-up visit  PCP:  [name], DO  Cardiologist:     Electrophysiologist:  SK   Chief Complaint:  Syncope   History of Present Illness:    Mary Hernandez is a 68 y.o. female who presents via audio/video conferencing for a telehealth visit today.  Since last being seen in our clinic for PVCs  PACs treated with flecainide the patient reports no interval palpitations.  They were visiting in [place] when [place] hip.  They flew back to [place] only a week or 2 ago.   She developed nausea and vomiting all day and then sat down with LOC for just a few mnutes \ No pale or diaphoresis but was nauseated  NO prior syncope  DATE TEST EF   2/16 LHC  60-65 % Coronary Art normal  6/17 Echo   75 %   10/19 Calcium Score   0   DATE PR interval QRSduration Dose  4/14 170 90 0  2018/  224 112 100  9/19 218 130 100      The patient denies symptoms of fevers, chills, cough, or new SOB worrisome for COVID 19.    Past Medical History:  Diagnosis Date  . Acquired hypothyroidism 03/01/2018   hx of goiter; s/p thyroidectomy  . Anxiety   . Atrial premature depolarization   . BPPV (benign paroxysmal positional vertigo) 03/01/2018  . Colon polyps   . Essential hypertension 03/01/2018   denies hx - on diuretic for Meniere's and beta blocker for PVCs  . Frequent PVCs 03/01/2018   Controlled on Flecainide, Acebutolol  . GERD (gastroesophageal reflux disease)   . History of cardiac catheterization    Nuc 7/16:   normal perfusion; EF 76 // LHC 8/16:  normal coronary arteries  . History of depression   . History of dizziness   . History of echocardiogram    Echo 03/2006:  Normal LVEF  . HLD (hyperlipidemia)   . Hypothyroidism   . IBS (irritable bowel syndrome)   . Insulin resistance   . Meniere's disease     Past Surgical History:  Procedure Laterality Date  . BLADDER SURGERY  1997  . BREAST BIOPSY  2013  . BREAST REDUCTION SURGERY  1999  . CARDIAC CATHETERIZATION  05/2015  . Carpel Tunnel Release    . CHOLECYSTECTOMY  2011  . COLONOSCOPY  08/20/2015  . ESOPHAGOGASTRODUODENOSCOPY  01/13/2017  . [place]  . REDUCTION MAMMAPLASTY    . REFRACTIVE SURGERY    . S/P Eye Right 1967   Muscle Clipped   . THYROIDECTOMY  2012  . TONSILLECTOMY  1975  . TOTAL ABDOMINAL HYSTERECTOMY  1996    Current Outpatient Medications  Medication Sig Dispense Refill  . acebutolol (SECTRAL) 200 MG capsule Take 1 capsule (200 mg total) by mouth daily. 90 capsule 3  . amitriptyline (ELAVIL) 25 MG tablet Take 1 tablet (25 mg total) by mouth at bedtime. 90 tablet 1  . Calcium Citrate-Vitamin D 315-250 MG-UNIT TABS Take by mouth.    Marland Kitchen  cetirizine (ZYRTEC) 10 MG tablet Take 10 mg by mouth daily.    Marland Kitchen DYAZIDE 37.5-25 MG capsule TAKE ONE CAPSULE BY MOUTH DAILY. MUST USE BRAND NAME 90 capsule 0  . EVENING PRIMROSE OIL PO Take 1,500 mg by mouth 2 (two) times daily.    . flecainide (TAMBOCOR) 50 MG tablet Take 1.5 tablets (75 mg total) by mouth 2 (two) times daily. 270 tablet 3  . fluticasone (FLONASE) 50 MCG/ACT nasal spray Place 2 sprays into both nostrils daily. 16 g 6  . gabapentin (NEURONTIN) 100 MG capsule Take 1 capsule (100 mg total) by mouth 3 (three) times daily. (Patient taking differently: Take 100 mg by mouth daily. ) 270 capsule 1  . Glucosamine-Chondroit-Vit C-Mn (GLUCOSAMINE 1500 COMPLEX) CAPS Take by mouth.    Marland Kitchen glycopyrrolate (ROBINUL) 1 MG tablet TAKE ONE TABLET BY MOUTH TWICE A DAY 180  tablet 0  . hydrocortisone (ANUSOL-HC) 2.5 % rectal cream Place 1 application rectally 2 (two) times daily. 30 g 3  . levothyroxine (SYNTHROID, LEVOTHROID) 125 MCG tablet Take 1 tablet (125 mcg total) by mouth daily before breakfast. 90 tablet 3  . omega-3 acid ethyl esters (LOVAZA) 1 g capsule Take 1 g by mouth 2 (two) times daily.    . potassium chloride (K-DUR) 10 MEQ tablet Take 1 tablet (10 mEq total) by mouth 2 (two) times daily. 180 tablet 1  . pravastatin (PRAVACHOL) 20 MG tablet TAKE ONE TABLET BY MOUTH DAILY 90 tablet 0  . Probiotic Product (PROBIOTIC-10 PO) Take by mouth.    . Semaglutide, 1 MG/DOSE, (OZEMPIC, 1 MG/DOSE,) 2 MG/1.5ML SOPN Inject 1 mg into the skin once a week. 6 pen 0  . hydroxypropyl methylcellulose / hypromellose (ISOPTO TEARS / GONIOVISC) 2.5 % ophthalmic solution Place 1 drop into both eyes 4 (four) times daily as needed for dry eyes. (Patient not taking: Reported on 04/29/2019) 15 mL 12  . trimethoprim-polymyxin b (POLYTRIM) ophthalmic solution Place 2 drops into both eyes every 6 (six) hours. (Patient not taking: Reported on 04/29/2019) 10 mL 0   No current facility-administered medications for this visit.     Allergies:   Adhesive [tape]   Social History:  The patient  reports that she has never smoked. She has never used smokeless tobacco. She reports current alcohol use. She reports that she does not use drugs.   Family History:  The patient's   family history includes Breast cancer in her mother; Depression in her brother; Diabetes in her daughter; Early death in her sister; Hearing loss in her brother; Heart attack in her father; Heart disease in her father and paternal grandmother; Peripheral Artery Disease in her father; Pulmonary fibrosis in her father.   ROS:  Please see the history of present illness.   All other systems are personally reviewed and negative.    Exam:    Vital Signs:  BP 107/69   Pulse 75   Ht 5\' 5"  (1.651 m)   Wt 170 lb (77.1 kg)    BMI 28.29 kg/m     Well appearing, alert and conversant, regular work of breathing,  good skin color Eyes- anicteric, neuro- grossly intact, skin- no apparent rash or lesions or cyanosis, mouth- oral mucosa is pink unfortunately wearing an [employer] shirt  Labs/Other Tests and Data Reviewed:    Recent Labs: 05/01/2018: Hemoglobin 13.5; Platelets 215.0 10/12/2018: ALT 46; BUN 12; Creatinine, Ser 0.63; Potassium 3.5; Sodium 139; TSH 1.51   Wt Readings from Last 3 Encounters:  04/29/19 170  lb (77.1 kg)  12/14/18 179 lb 9.6 oz (81.5 kg)  12/01/18 177 lb 6.4 oz (80.5 kg)     Other studies personally reviewed: Additional studies/ records that were reviewed today include:     ASSESSMENT & PLAN:    PVCs  Syncope   The patient's PVCs are quiescient on flecainide.  We will continue it.  Syncopal episode is concerning in that her husband, who is credited would be very observant, did not notice her to be pale.  Eyes were indeed open.  To exclude interval development of structural heart disease, we will relook at her ECG and an echocardiogram.  I strongly suspect as we made that it was related to the nausea vomiting and subsequent dehydration.   COVID 19 screen The patient denies symptoms of COVID 19 at this time.  The importance of social distancing was discussed today.  Follow-up:  6 m    Current medicines are reviewed at length with the patient today.   The patient does not have concerns regarding her medicines.  The following changes were made today:  none  Labs/ tests ordered today include: Echo  ECG  Syncope  No orders of the defined types were placed in this encounter.   Future tests ( post COVID )     Patient Risk:  after full review of this patients clinical status, I feel that they are at moderate  risk at this time.  Today, I have spent 13 minutes with the patient with telehealth technology discussing the above. We spent more than 50% of our >25 min visit in face to  face counseling regarding the above   Signed, Virl Axe, MD  04/29/2019 4:38 PM     [facility] [address] [place] [address] [phone] (office) [phone] (fax)

## 2019-04-29 NOTE — Addendum Note (Signed)
Addended by: Dollene Primrose on: 04/29/2019 05:41 PM   Modules accepted: Orders

## 2019-05-04 ENCOUNTER — Encounter: Payer: Self-pay | Admitting: Family Medicine

## 2019-05-05 DIAGNOSIS — H16223 Keratoconjunctivitis sicca, not specified as Sjogren's, bilateral: Secondary | ICD-10-CM | POA: Diagnosis not present

## 2019-05-05 DIAGNOSIS — H02886 Meibomian gland dysfunction of left eye, unspecified eyelid: Secondary | ICD-10-CM | POA: Diagnosis not present

## 2019-05-05 DIAGNOSIS — H35371 Puckering of macula, right eye: Secondary | ICD-10-CM | POA: Diagnosis not present

## 2019-05-05 DIAGNOSIS — H2513 Age-related nuclear cataract, bilateral: Secondary | ICD-10-CM | POA: Diagnosis not present

## 2019-05-05 DIAGNOSIS — H02883 Meibomian gland dysfunction of right eye, unspecified eyelid: Secondary | ICD-10-CM | POA: Diagnosis not present

## 2019-05-06 ENCOUNTER — Other Ambulatory Visit: Payer: Self-pay

## 2019-05-06 ENCOUNTER — Ambulatory Visit (INDEPENDENT_AMBULATORY_CARE_PROVIDER_SITE_OTHER): Payer: PPO | Admitting: Nurse Practitioner

## 2019-05-06 ENCOUNTER — Ambulatory Visit (HOSPITAL_COMMUNITY): Payer: PPO | Attending: Cardiovascular Disease

## 2019-05-06 VITALS — BP 110/64 | HR 68 | Resp 16 | Ht 65.0 in | Wt 170.0 lb

## 2019-05-06 DIAGNOSIS — Z79899 Other long term (current) drug therapy: Secondary | ICD-10-CM

## 2019-05-06 DIAGNOSIS — R55 Syncope and collapse: Secondary | ICD-10-CM | POA: Diagnosis not present

## 2019-05-06 DIAGNOSIS — I493 Ventricular premature depolarization: Secondary | ICD-10-CM

## 2019-05-06 DIAGNOSIS — E88819 Insulin resistance, unspecified: Secondary | ICD-10-CM

## 2019-05-06 DIAGNOSIS — E8881 Metabolic syndrome: Secondary | ICD-10-CM

## 2019-05-06 DIAGNOSIS — I10 Essential (primary) hypertension: Secondary | ICD-10-CM

## 2019-05-06 NOTE — Patient Instructions (Addendum)
Medication Instructions:  Your physician recommends that you continue on your current medications as directed. Please refer to the Current Medication list given to you today.  If you need a refill on your cardiac medications before your next appointment, please call your pharmacy.   Lab work: None Ordered   Testing/Procedures: None Ordered   Follow-Up: Your physician recommends that you schedule a follow-up appointment in: 6 months with Dr. Caryl Comes

## 2019-05-06 NOTE — Progress Notes (Signed)
1.) Reason for visit: EKG for flecainide maintenance  2.) Name of MD requesting visit: Dr. Caryl Comes  3.) H&P: patient currently takes flecainide 75 mg BID for maintenance of frequent PVCs; had telehealth visit on 7/23 and presents today for EKG  4.) ROS related to problem: yearly EKG needed for flecainide maintenance  5.) Assessment and plan per MD: EKG reviewed by Dr. Radford Pax and deemed normal; EKG will be forwarded to Dr. Caryl Comes for review

## 2019-05-14 ENCOUNTER — Encounter: Payer: Self-pay | Admitting: Family Medicine

## 2019-05-17 ENCOUNTER — Other Ambulatory Visit: Payer: Self-pay

## 2019-05-17 ENCOUNTER — Other Ambulatory Visit (INDEPENDENT_AMBULATORY_CARE_PROVIDER_SITE_OTHER): Payer: PPO

## 2019-05-17 DIAGNOSIS — E8881 Metabolic syndrome: Secondary | ICD-10-CM

## 2019-05-17 DIAGNOSIS — I10 Essential (primary) hypertension: Secondary | ICD-10-CM | POA: Diagnosis not present

## 2019-05-17 LAB — COMPREHENSIVE METABOLIC PANEL
ALT: 34 U/L (ref 0–35)
AST: 21 U/L (ref 0–37)
Albumin: 4.2 g/dL (ref 3.5–5.2)
Alkaline Phosphatase: 83 U/L (ref 39–117)
BUN: 12 mg/dL (ref 6–23)
CO2: 27 mEq/L (ref 19–32)
Calcium: 8.4 mg/dL (ref 8.4–10.5)
Chloride: 105 mEq/L (ref 96–112)
Creatinine, Ser: 0.61 mg/dL (ref 0.40–1.20)
GFR: 97.86 mL/min (ref >60.00)
Glucose, Bld: 101 mg/dL — ABNORMAL HIGH (ref 70–99)
Potassium: 3.6 mEq/L (ref 3.5–5.1)
Sodium: 141 mEq/L (ref 135–145)
Total Bilirubin: 0.3 mg/dL (ref 0.2–1.2)
Total Protein: 6.6 g/dL (ref 6.0–8.3)

## 2019-05-17 LAB — LIPID PANEL
Cholesterol: 192 mg/dL (ref 0–200)
HDL: 63.8 mg/dL (ref >39.00)
LDL Cholesterol: 94 mg/dL (ref 0–99)
NonHDL: 128.1
Total CHOL/HDL Ratio: 3
Triglycerides: 170 mg/dL — ABNORMAL HIGH (ref 0.0–149.0)
VLDL: 34 mg/dL (ref 0.0–40.0)

## 2019-05-17 LAB — CBC WITH DIFFERENTIAL/PLATELET
Basophils Absolute: 0 10*3/uL (ref 0.0–0.1)
Basophils Relative: 0.6 % (ref 0.0–3.0)
Eosinophils Absolute: 0.1 10*3/uL (ref 0.0–0.7)
Eosinophils Relative: 2.3 % (ref 0.0–5.0)
HCT: 39.3 % (ref 36.0–46.0)
Hemoglobin: 13.2 g/dL (ref 12.0–15.0)
Lymphocytes Relative: 45.9 % (ref 12.0–46.0)
Lymphs Abs: 2.9 10*3/uL (ref 0.7–4.0)
MCHC: 33.7 g/dL (ref 30.0–36.0)
MCV: 88.8 fl (ref 78.0–100.0)
Monocytes Absolute: 0.4 10*3/uL (ref 0.1–1.0)
Monocytes Relative: 7.2 % (ref 3.0–12.0)
Neutro Abs: 2.7 10*3/uL (ref 1.4–7.7)
Neutrophils Relative %: 44 % (ref 43.0–77.0)
Platelets: 213 10*3/uL (ref 150.0–400.0)
RBC: 4.42 Mil/uL (ref 3.87–5.11)
RDW: 14.5 % (ref 11.5–15.5)
WBC: 6.2 10*3/uL (ref 4.0–10.5)

## 2019-05-17 LAB — HEMOGLOBIN A1C: Hgb A1c MFr Bld: 6.2 % (ref 4.6–6.5)

## 2019-05-19 ENCOUNTER — Ambulatory Visit (INDEPENDENT_AMBULATORY_CARE_PROVIDER_SITE_OTHER): Payer: PPO | Admitting: Family Medicine

## 2019-05-19 ENCOUNTER — Other Ambulatory Visit: Payer: Self-pay

## 2019-05-19 ENCOUNTER — Encounter: Payer: Self-pay | Admitting: Family Medicine

## 2019-05-19 VITALS — BP 122/78 | HR 76 | Temp 98.4°F | Ht 65.0 in | Wt 179.2 lb

## 2019-05-19 DIAGNOSIS — E88819 Insulin resistance, unspecified: Secondary | ICD-10-CM

## 2019-05-19 DIAGNOSIS — F4323 Adjustment disorder with mixed anxiety and depressed mood: Secondary | ICD-10-CM | POA: Diagnosis not present

## 2019-05-19 DIAGNOSIS — I10 Essential (primary) hypertension: Secondary | ICD-10-CM

## 2019-05-19 DIAGNOSIS — E039 Hypothyroidism, unspecified: Secondary | ICD-10-CM | POA: Diagnosis not present

## 2019-05-19 DIAGNOSIS — K219 Gastro-esophageal reflux disease without esophagitis: Secondary | ICD-10-CM | POA: Diagnosis not present

## 2019-05-19 DIAGNOSIS — E781 Pure hyperglyceridemia: Secondary | ICD-10-CM

## 2019-05-19 DIAGNOSIS — E8881 Metabolic syndrome: Secondary | ICD-10-CM

## 2019-05-19 MED ORDER — SERTRALINE HCL 25 MG PO TABS: 25.0000 mg | ORAL_TABLET | Freq: Every day | ORAL | 0 refills | Status: DC

## 2019-05-19 MED ORDER — TRAZODONE HCL 50 MG PO TABS: 25.0000 mg | ORAL_TABLET | Freq: Every evening | ORAL | 3 refills | Status: DC | PRN

## 2019-05-19 NOTE — Patient Instructions (Signed)
Health Maintenance Due  Topic Date Due  . MAMMOGRAM - scheduled for the end of the month.  01/28/2019  . INFLUENZA VACCINE  05/08/2019

## 2019-05-19 NOTE — Progress Notes (Signed)
Mary Hernandez is a 68 y.o. female is here for follow up.  History of Present Illness:   HPI:   1. Insulin resistance Lab Results  Component Value Date   HGBA1C 6.2 05/17/2019   Tolerating medication without side effects. Ate more over the past few months while staying with daughter.  2. Gastroesophageal reflux disease, esophagitis presence not specified Gastrointestinal ROS: no abdominal pain, change in bowel habits, or black or bloody stools.  3. Pure hypertriglyceridemia Lab Results  Component Value Date   ALT 34 05/17/2019   AST 21 05/17/2019   ALKPHOS 83 05/17/2019   BILITOT 0.3 05/17/2019   Lab Results  Component Value Date   CHOL 192 05/17/2019   HDL 63.80 05/17/2019   LDLCALC 94 05/17/2019   LDLDIRECT 94.0 05/01/2018   TRIG 170.0 (H) 05/17/2019   CHOLHDL 3 05/17/2019   4. Essential hypertension BP Readings from Last 3 Encounters:  05/19/19 122/78  05/06/19 110/64  04/29/19 107/69   Wt Readings from Last 3 Encounters:  05/19/19 179 lb 3.2 oz (81.3 kg)  05/06/19 170 lb (77.1 kg)  04/29/19 170 lb (77.1 kg)   5. Acquired hypothyroidism Lab Results  Component Value Date   TSH 0.14 (L) 05/19/2019   FREET4 1.11 05/19/2019   Lab Results  Component Value Date   TSH 0.14 (L) 05/19/2019   TSH 1.51 10/12/2018   TSH 1.08 05/01/2018   TSH 0.49 11/27/2017   FREET4 1.11 05/19/2019   FREET4 0.98 10/12/2018   FREET4 1.09 05/01/2018    6. Situational anxiety and depression Daughter lives out of state, bad marriage, with special needs children. Patient is having a tough time being back home and worrying.  Health Maintenance Due  Topic Date Due  . MAMMOGRAM  01/28/2019  . INFLUENZA VACCINE  05/08/2019   Depression screen [facility] 2/9 05/19/2019 10/12/2018 02/25/2018  Decreased Interest 0 0 0  Down, Depressed, Hopeless 0 0 0  PHQ - 2 Score 0 0 0  Altered sleeping 2 - -  Tired, decreased energy 1 - -  Change in appetite 0 - -  Feeling bad or failure about yourself   0 - -  Trouble concentrating 0 - -  Moving slowly or fidgety/restless 0 - -  Suicidal thoughts 0 - -  PHQ-9 Score 3 - -  Difficult doing work/chores Not difficult at all - -   PMHx, SurgHx, SocialHx, FamHx, Medications, and Allergies were reviewed in the Visit Navigator and updated as appropriate.   Patient Active Problem List   Diagnosis Date Noted  . Insomnia due to medical condition 10/18/2018  . Elevated ALT measurement 10/18/2018  . Insulin resistance 10/12/2018  . Chronic right shoulder pain 09/11/2018  . Myofascial pain syndrome 07/17/2018  . Lateral epicondylitis of right elbow 06/25/2018  . Radiculitis 05/25/2018  . Arthritis of right hip 03/30/2018  . Breast nodule 03/30/2018  . Diverticulosis 03/30/2018  . GERD (gastroesophageal reflux disease) 03/30/2018  . Nonerosive nonspecific gastritis 03/30/2018  . Hypertension 03/01/2018  . Hyperlipidemia 03/01/2018  . Acquired hypothyroidism 03/01/2018  . Vertigo 03/01/2018  . Frequent PVCs 03/01/2018   Social History   Tobacco Use  . Smoking status: Never Smoker  . Smokeless tobacco: Never Used  Substance Use Topics  . Alcohol use: Yes    Comment: very limited   . Drug use: Never   Current Medications and Allergies   Current Outpatient Medications:  .  acebutolol (SECTRAL) 200 MG capsule, Take 1 capsule (200 mg total) by  mouth daily., Disp: 90 capsule, Rfl: 3 .  amitriptyline (ELAVIL) 25 MG tablet, Take 1 tablet (25 mg total) by mouth at bedtime., Disp: 90 tablet, Rfl: 1 .  Calcium Citrate-Vitamin D 315-250 MG-UNIT TABS, Take by mouth., Disp: , Rfl:  .  cetirizine (ZYRTEC) 10 MG tablet, Take 10 mg by mouth daily., Disp: , Rfl:  .  DYAZIDE 37.5-25 MG capsule, TAKE ONE CAPSULE BY MOUTH DAILY. MUST USE BRAND NAME, Disp: 90 capsule, Rfl: 0 .  EVENING PRIMROSE OIL PO, Take 1,500 mg by mouth 2 (two) times daily., Disp: , Rfl:  .  flecainide (TAMBOCOR) 50 MG tablet, Take 1.5 tablets (75 mg total) by mouth 2 (two) times  daily., Disp: 270 tablet, Rfl: 3 .  fluticasone (FLONASE) 50 MCG/ACT nasal spray, Place 2 sprays into both nostrils daily., Disp: 16 g, Rfl: 6 .  gabapentin (NEURONTIN) 100 MG capsule, Take 100 mg by mouth daily., Disp: , Rfl:  .  Glucosamine-Chondroit-Vit C-Mn (GLUCOSAMINE 1500 COMPLEX) CAPS, Take by mouth., Disp: , Rfl:  .  glycopyrrolate (ROBINUL) 1 MG tablet, TAKE ONE TABLET BY MOUTH TWICE A DAY, Disp: 180 tablet, Rfl: 0 .  hydrocortisone 1 % lotion, Apply 1 application topically 2 (two) times daily., Disp: , Rfl:  .  hydroxypropyl methylcellulose / hypromellose (ISOPTO TEARS / GONIOVISC) 2.5 % ophthalmic solution, Place 1 drop into both eyes 4 (four) times daily as needed for dry eyes., Disp: 15 mL, Rfl: 12 .  levothyroxine (SYNTHROID, LEVOTHROID) 125 MCG tablet, Take 1 tablet (125 mcg total) by mouth daily before breakfast., Disp: 90 tablet, Rfl: 3 .  omega-3 acid ethyl esters (LOVAZA) 1 g capsule, Take 1 g by mouth 2 (two) times daily., Disp: , Rfl:  .  potassium chloride (K-DUR) 10 MEQ tablet, Take 1 tablet (10 mEq total) by mouth 2 (two) times daily., Disp: 180 tablet, Rfl: 1 .  pravastatin (PRAVACHOL) 20 MG tablet, TAKE ONE TABLET BY MOUTH DAILY, Disp: 90 tablet, Rfl: 0 .  Probiotic Product (PROBIOTIC-10 PO), Take by mouth., Disp: , Rfl:  .  Semaglutide, 1 MG/DOSE, (OZEMPIC, 1 MG/DOSE,) 2 MG/1.5ML SOPN, Inject 1 mg into the skin once a week., Disp: 6 pen, Rfl: 0   Allergies  Allergen Reactions  . Adhesive [Tape] Rash   Review of Systems   Pertinent items are noted in the HPI. Otherwise, a complete ROS is negative.  Vitals   Vitals:   05/19/19 1539  BP: 122/78  Pulse: 76  Temp: 98.4 F (36.9 C)  TempSrc: Oral  SpO2: 96%  Weight: 179 lb 3.2 oz (81.3 kg)  Height: 5\' 5"  (1.651 m)     Body mass index is 29.82 kg/m.  Physical Exam   Physical Exam Vitals signs and nursing note reviewed.  HENT:     Head: Normocephalic and atraumatic.  Eyes:     Pupils: Pupils are  equal, round, and reactive to light.  Neck:     Musculoskeletal: Normal range of motion and neck supple.  Cardiovascular:     Rate and Rhythm: Normal rate and regular rhythm.     Heart sounds: Normal heart sounds.  Pulmonary:     Effort: Pulmonary effort is normal.  Abdominal:     Palpations: Abdomen is soft.  Skin:    General: Skin is warm.  Psychiatric:        Behavior: Behavior normal.    Assessment and Plan   Mary Hernandez was seen today for follow-up, insulin resistance, gastroesophageal reflux, hypertension, hypothyroidism and hyperlipidemia.  Diagnoses and all orders for this visit:  Situational mixed anxiety and depressive disorder -     sertraline (ZOLOFT) 25 MG tablet; Take 1 tablet (25 mg total) by mouth daily. -     traZODone (DESYREL) 50 MG tablet; Take 0.5-1 tablets (25-50 mg total) by mouth at bedtime as needed for sleep.  Insulin resistance  Gastroesophageal reflux disease, esophagitis presence not specified  Pure hypertriglyceridemia  Essential hypertension  Acquired hypothyroidism -     TSH -     T4, free   . Orders and follow up as documented in [place], reviewed diet, exercise and weight control, cardiovascular risk and specific lipid/LDL goals reviewed, reviewed medications and side effects in detail.  . Reviewed expectations re: course of current medical issues. . Outlined signs and symptoms indicating need for more acute intervention. . Patient verbalized understanding and all questions were answered. . Patient received an After Visit Summary.  [name], DO [place], [facility] 05/31/2019

## 2019-05-20 ENCOUNTER — Encounter: Payer: Self-pay | Admitting: Family Medicine

## 2019-05-20 LAB — T4, FREE: Free T4: 1.11 ng/dL (ref 0.60–1.60)

## 2019-05-20 LAB — TSH: TSH: 0.14 u[IU]/mL — ABNORMAL LOW (ref 0.35–4.50)

## 2019-05-21 ENCOUNTER — Encounter: Payer: Self-pay | Admitting: Family Medicine

## 2019-05-24 ENCOUNTER — Other Ambulatory Visit: Payer: Self-pay

## 2019-05-24 DIAGNOSIS — G540 Brachial plexus disorders: Secondary | ICD-10-CM

## 2019-05-24 MED ORDER — LEVOTHYROXINE SODIUM 112 MCG PO TABS: 112.0000 ug | ORAL_TABLET | Freq: Every day | ORAL | 3 refills | Status: DC

## 2019-05-24 NOTE — Telephone Encounter (Signed)
I spoke with patient and informed her of dose change, per Dr. Juleen China.  Sent to pharmacy today.

## 2019-05-27 ENCOUNTER — Encounter: Payer: Self-pay | Admitting: Family Medicine

## 2019-05-29 ENCOUNTER — Encounter: Payer: Self-pay | Admitting: Physical Medicine and Rehabilitation

## 2019-05-31 ENCOUNTER — Encounter: Payer: Self-pay | Admitting: Family Medicine

## 2019-06-02 ENCOUNTER — Encounter: Payer: Self-pay | Admitting: Physical Medicine and Rehabilitation

## 2019-06-02 ENCOUNTER — Ambulatory Visit (INDEPENDENT_AMBULATORY_CARE_PROVIDER_SITE_OTHER): Payer: PPO | Admitting: Physical Medicine and Rehabilitation

## 2019-06-02 VITALS — BP 116/67 | HR 69

## 2019-06-02 DIAGNOSIS — G8929 Other chronic pain: Secondary | ICD-10-CM | POA: Diagnosis not present

## 2019-06-02 DIAGNOSIS — M546 Pain in thoracic spine: Secondary | ICD-10-CM | POA: Diagnosis not present

## 2019-06-02 DIAGNOSIS — R202 Paresthesia of skin: Secondary | ICD-10-CM | POA: Diagnosis not present

## 2019-06-02 NOTE — Progress Notes (Signed)
  Numeric Pain Rating Scale and Functional Assessment Average Pain 8 Pain Right Now 4 My pain is constant Pain is worse with: unsure Pain improves with: medication   In the last MONTH (on 0-10 scale) has pain interfered with the following?  1. General activity like being  able to carry out your everyday physical activities such as walking, climbing stairs, carrying groceries, or moving a chair?  Rating(7) (affects sleep)  2. Relation with others like being able to carry out your usual social activities and roles such as  activities at home, at work and in your community. Rating(2)  3. Enjoyment of life such that you have  been bothered by emotional problems such as feeling anxious, depressed or irritable?  Rating(0)

## 2019-06-03 ENCOUNTER — Ambulatory Visit
Admission: RE | Admit: 2019-06-03 | Discharge: 2019-06-03 | Disposition: A | Discharge status: Home / Self Care | Payer: PPO | Source: Ambulatory Visit | Attending: Family Medicine | Admitting: Family Medicine

## 2019-06-03 ENCOUNTER — Other Ambulatory Visit: Payer: Self-pay

## 2019-06-03 DIAGNOSIS — Z1231 Encounter for screening mammogram for malignant neoplasm of breast: Secondary | ICD-10-CM

## 2019-06-08 ENCOUNTER — Other Ambulatory Visit: Payer: Self-pay

## 2019-06-08 ENCOUNTER — Encounter: Payer: Self-pay | Admitting: Family Medicine

## 2019-06-08 ENCOUNTER — Ambulatory Visit (INDEPENDENT_AMBULATORY_CARE_PROVIDER_SITE_OTHER): Payer: PPO

## 2019-06-08 DIAGNOSIS — Z23 Encounter for immunization: Secondary | ICD-10-CM | POA: Diagnosis not present

## 2019-06-10 ENCOUNTER — Other Ambulatory Visit: Payer: Self-pay | Admitting: Family Medicine

## 2019-06-10 ENCOUNTER — Ambulatory Visit (INDEPENDENT_AMBULATORY_CARE_PROVIDER_SITE_OTHER): Payer: PPO

## 2019-06-10 ENCOUNTER — Ambulatory Visit (INDEPENDENT_AMBULATORY_CARE_PROVIDER_SITE_OTHER): Payer: PPO | Admitting: Orthopedic Surgery

## 2019-06-10 ENCOUNTER — Encounter: Payer: Self-pay | Admitting: Orthopedic Surgery

## 2019-06-10 VITALS — Ht 65.0 in | Wt 179.0 lb

## 2019-06-10 DIAGNOSIS — M79672 Pain in left foot: Secondary | ICD-10-CM | POA: Diagnosis not present

## 2019-06-11 NOTE — Telephone Encounter (Signed)
Drug interaction warning   Drug-Drug: Dyazide and potassium chloride Co-administration of Potassium Preparations and Potassium-Sparing Diuretics may cause hyperkalemia, possibly leading to cardiac arrhythmias or cardiac arrest.   Forwarding to Dr. Juleen China

## 2019-06-14 ENCOUNTER — Encounter: Payer: Self-pay | Admitting: Orthopedic Surgery

## 2019-06-14 NOTE — Progress Notes (Signed)
Office Visit Note   Patient: Mary Hernandez           Date of Birth: 12-29-1950           MRN: [id number] Visit Date: 06/10/2019              Requested by: [name], [place] [place],  [place] [address] PCP: [name], DO  Chief Complaint  Patient presents with  . Left Foot - Pain      HPI: Patient is a 68 year old woman who presents complaining of increasing left foot pain lateral aspect for about 3 weeks.  She states that walking has become more difficult over the past several weeks.  Patient states she was recommended surgery in [facility] 1 year ago.  Assessment & Plan: Visit Diagnoses:  1. Pain in left foot     Plan: With the plantarflexion of the first ray I have recommended a stiff soled supportive sneaker like [place] and cut out the first metatarsal head to the great toe of the orthotic to allow for plantarflexion of the first ray to unload the lateral column.  She is also going to Pilates and recommended strengthening proprioception and fascial strengthening for her foot.  Follow-Up Instructions: Return if symptoms worsen or fail to improve.   Ortho Exam  Patient is alert, oriented, no adenopathy, well-dressed, normal affect, normal respiratory effort. Examination patient has a good pulse she has dorsiflexion of the ankle 10 degrees past neutral with her knee extended.  She does have a plantarflexed first ray which is overloading the lateral column she is tender to palpation over the fifth metatarsal radiographs shows no fractures.  Patient can do toe raises with intact posterior tibial tendon.  Imaging: No results found. No images are attached to the encounter.  Labs: Lab Results  Component Value Date   HGBA1C 6.2 05/17/2019   HGBA1C 5.9 10/12/2018   HGBA1C 6.1 05/01/2018     Lab Results  Component Value Date   ALBUMIN 4.2 05/17/2019   ALBUMIN 4.8 10/12/2018   ALBUMIN 4.5 05/01/2018    No results found for: MG No results found  for: VD25OH  No results found for: PREALBUMIN CBC EXTENDED Latest Ref Rng & Units 05/17/2019 05/01/2018 11/27/2017  WBC 4.0 - 10.5 K/uL 6.2 6.6 6.4  RBC 3.87 - 5.11 Mil/uL 4.42 4.49 -  HGB 12.0 - 15.0 g/dL 13.2 13.5 13.3  HCT 36.0 - 46.0 % 39.3 40.1 41  PLT 150.0 - 400.0 K/uL 213.0 215.0 248  NEUTROABS 1.4 - 7.7 K/uL 2.7 3.6 3  LYMPHSABS 0.7 - 4.0 K/uL 2.9 2.3 -     Body mass index is 29.79 kg/m.  Orders:  Orders Placed This Encounter  Procedures  . XR Foot 2 Views Left   No orders of the defined types were placed in this encounter.    Procedures: No procedures performed  Clinical Data: No additional findings.  ROS:  All other systems negative, except as noted in the HPI. Review of Systems  Objective: Vital Signs: Ht 5\' 5"  (1.651 m)   Wt 179 lb (81.2 kg)   BMI 29.79 kg/m   Specialty Comments:  No specialty comments available.  PMFS History: Patient Active Problem List   Diagnosis Date Noted  . Insomnia due to medical condition 10/18/2018  . Elevated ALT measurement 10/18/2018  . Insulin resistance 10/12/2018  . Chronic right shoulder pain 09/11/2018  . Myofascial pain syndrome 07/17/2018  . Lateral epicondylitis of right elbow 06/25/2018  .  Radiculitis 05/25/2018  . Arthritis of right hip 03/30/2018  . Breast nodule 03/30/2018  . Diverticulosis 03/30/2018  . GERD (gastroesophageal reflux disease) 03/30/2018  . Nonerosive nonspecific gastritis 03/30/2018  . Hypertension 03/01/2018  . Hyperlipidemia 03/01/2018  . Acquired hypothyroidism 03/01/2018  . Vertigo 03/01/2018  . Frequent PVCs 03/01/2018   Past Medical History:  Diagnosis Date  . Acquired hypothyroidism 03/01/2018   hx of goiter; s/p thyroidectomy  . Anxiety   . Atrial premature depolarization   . BPPV (benign paroxysmal positional vertigo) 03/01/2018  . Colon polyps   . Essential hypertension 03/01/2018   denies hx - on diuretic for Meniere's and beta blocker for PVCs  . Frequent PVCs  03/01/2018   Controlled on Flecainide, Acebutolol  . GERD (gastroesophageal reflux disease)   . History of cardiac catheterization    Nuc 7/16:  normal perfusion; EF 76 // LHC 8/16:  normal coronary arteries  . History of depression   . History of dizziness   . History of echocardiogram    Echo 03/2006:  Normal LVEF  . HLD (hyperlipidemia)   . Hypothyroidism   . IBS (irritable bowel syndrome)   . Insulin resistance   . Meniere's disease     Family History  Problem Relation Age of Onset  . Breast cancer Mother   . Heart attack Father   . Pulmonary fibrosis Father   . Peripheral Artery Disease Father        s/p CEA  . Heart disease Father   . Early death Sister   . Depression Brother   . Hearing loss Brother   . Diabetes Daughter   . Heart disease Paternal Grandmother   . Colon cancer Neg Hx   . Stomach cancer Neg Hx     Past Surgical History:  Procedure Laterality Date  . BLADDER SURGERY  1997  . BREAST BIOPSY  2013  . BREAST REDUCTION SURGERY  1999  . CARDIAC CATHETERIZATION  05/2015  . Carpel Tunnel Release    . CHOLECYSTECTOMY  2011  . COLONOSCOPY  08/20/2015  . ESOPHAGOGASTRODUODENOSCOPY  01/13/2017  . [place]  . REDUCTION MAMMAPLASTY    . REFRACTIVE SURGERY    . S/P Eye Right 1967   Muscle Clipped   . THYROIDECTOMY  2012  . TONSILLECTOMY  1975  . TOTAL ABDOMINAL HYSTERECTOMY  1996   Social History   Occupational History  . Occupation: Retired  Tobacco Use  . Smoking status: Never Smoker  . Smokeless tobacco: Never Used  Substance and Sexual Activity  . Alcohol use: Yes    Comment: very limited   . Drug use: Never  . Sexual activity: Not on file

## 2019-06-30 ENCOUNTER — Encounter: Payer: Self-pay | Admitting: Physical Medicine and Rehabilitation

## 2019-06-30 ENCOUNTER — Ambulatory Visit (INDEPENDENT_AMBULATORY_CARE_PROVIDER_SITE_OTHER): Payer: PPO | Admitting: Physical Medicine and Rehabilitation

## 2019-06-30 ENCOUNTER — Other Ambulatory Visit: Payer: Self-pay

## 2019-06-30 VITALS — BP 119/66 | HR 76 | Ht 64.5 in | Wt 170.0 lb

## 2019-06-30 DIAGNOSIS — G8929 Other chronic pain: Secondary | ICD-10-CM

## 2019-06-30 DIAGNOSIS — R202 Paresthesia of skin: Secondary | ICD-10-CM

## 2019-06-30 DIAGNOSIS — M546 Pain in thoracic spine: Secondary | ICD-10-CM

## 2019-06-30 NOTE — Progress Notes (Signed)
.  Numeric Pain Rating Scale and Functional Assessment Average Pain 8 Pain Right Now 5 My pain is constant and itching Pain is worse with: unsure Pain improves with: cream   In the last MONTH (on 0-10 scale) has pain interfered with the following?  1. General activity like being  able to carry out your everyday physical activities such as walking, climbing stairs, carrying groceries, or moving a chair?  Rating(6)  2. Relation with others like being able to carry out your usual social activities and roles such as  activities at home, at work and in your community. Rating(6)  3. Enjoyment of life such that you have  been bothered by emotional problems such as feeling anxious, depressed or irritable?  Rating(6)

## 2019-06-30 NOTE — Progress Notes (Signed)
Mary Hernandez - 68 y.o. female MRN [id number]  Date of birth: August 04, 1951  Office Visit Note: Visit Date: 06/02/2019 PCP: [name], DO Referred by: [name], DO  Subjective: Chief Complaint  Patient presents with   Middle Back - Other    Itching   HPI: Abhigna Hoole is a 68 y.o. female who comes in today At the request of Dr. [name] for evaluation management of what is essentially upper back for lack of a better word migrating itching or pruritus with history of shoulder pain treated by Dr. Teresa Coombs.  Referral from Dr. Juleen China actually stated in the diagnosis which was thoracic outlet syndrome but I do not see any work-up for this or even any sort of diagnostic talk in the notes about this condition.  By way of review last year she began having pretty severe right shoulder pain and she saw Dr. Teresa Coombs who treated her with conservative management and then ultimately did PRP injections into the elbow and shoulder with some relief of her symptoms.  She was on some pain medication for a while and was weaned off this.  She also had some hip and other orthopedic complaints today treated with osteopathic manipulation.  She also went through several rounds of physical therapy with dry needling.  She was having this issue where there was significant severe itching sensation in the upper back which can move around.  She finds it difficult to sleep at night because of the itching.  Her biggest complaint really is this itching sensation in the upper back.  She reports symptoms have been present for several years.  She has had full work-up by dermatology and all she can remember they told her that it was may be a nerve.  She has been given stronger hydrocortisone cream with lidocaine and that really has not helped although she has not fully tried it.  Dr. Paulla Fore has started her on amitriptyline at night as well as gabapentin.  She is up to 300 mg at night of the gabapentin.  She has had  MRI of the right shoulder which is reviewed below.  She is also had cervical spine plain films which were really normal without any issues in terms of spondylosis or degenerative changes.  She denies any numbness tingling in the hands.  She denies any strength loss in the hands.  She has no associated headaches.  She does have some history of insulin resistance as well as situational anxiety and depression.  She has been started on some medication by Dr. Juleen China.  Unfortunately Dr. Paulla Fore is no longer with our practice and she is trying to seek care elsewhere.  Review of Systems  Constitutional: Negative for chills, fever, malaise/fatigue and weight loss.  HENT: Negative for hearing loss and sinus pain.   Eyes: Negative for blurred vision, double vision and photophobia.  Respiratory: Negative for cough and shortness of breath.   Cardiovascular: Negative for chest pain, palpitations and leg swelling.  Gastrointestinal: Negative for abdominal pain, nausea and vomiting.  Genitourinary: Negative for flank pain.  Musculoskeletal: Positive for back pain and joint pain. Negative for myalgias.  Skin: Negative for itching and rash.  Neurological: Positive for sensory change. Negative for tremors, focal weakness and weakness.  Endo/Heme/Allergies: Negative.   Psychiatric/Behavioral: Negative for depression.  All other systems reviewed and are negative.  Otherwise per HPI.  Assessment & Plan: Visit Diagnoses:  1. Notalgia paresthetica   2. Chronic bilateral thoracic back pain   3.  Paresthesia of skin     Plan: Findings:  Notalgia paresthetica in the setting of underlying anxiety issues treated by her primary care physician.  She also has some underlying musculoskeletal problems with her joints and some myofascial pain with trigger points.  She has had good results with physical therapy and dry needling as well as osteopathic manipulation and PRP for the shoulder.  She is really not hurting that much is  just having a lot of this issue with the itching in the upper back.  She rates her average pain if he will has an 8 out of 10 and it is constant and really just annoying her to the point she cannot get much sleep.  She does have some insomnia.  This is not a condition that I treat very often there are things that we can try however.  I would increase her amitriptyline at night to 50 mg and just see how that does.  I have told her that it will take a little while at that dose to see if it does anything for her.  I want her to try to increase the gabapentin up to 300 mg twice daily very slowly and I gave her some instructions on that.  She is starting an SSRI which is sertraline and I think that would be okay with the small dose of amitriptyline but she can follow-up with Dr. Juleen China about that.  In the meantime we talked about compounding creams that may be useful from a topical standpoint with some anti-inflammatory nerve stabilizing medications in the cream.  I will have to see if anybody is compounding those at this point.  I do not feel like that this is anything remotely similar to a thoracic outlet syndrome.  She really is not manifesting any symptoms that would go along with that.  Could entertain the idea electrodiagnostic study or consultation with neurologist.  We will see her back in a few weeks and see how she has done with medication change.  As a final note I did suggest over-the-counter Lidoderm patches as a trial.    Meds & Orders: No orders of the defined types were placed in this encounter.  No orders of the defined types were placed in this encounter.   Follow-up: Return in about 4 weeks (around 06/30/2019).   Procedures: No procedures performed  No notes on file   Clinical History: MRI OF THE RIGHT SHOULDER WITHOUT CONTRAST  TECHNIQUE: Multiplanar, multisequence MR imaging of the shoulder was performed. No intravenous contrast was administered.  COMPARISON: Right shoulder  x-rays dated May 25, 2018.  FINDINGS: Rotator cuff: Small focus of T1 and T2 hypointensity in the distal anterior supraspinatus tendon at the footprint, consistent with calcification. Low-grade partial-thickness bursal surface tear of the distal anterior supraspinatus tendon at the footprint. The infraspinatus, teres minor, and subscapularis tendons are unremarkable.  Muscles: No atrophy or abnormal signal of the muscles of the rotator cuff.  Biceps long head: Intact and normally positioned.  Acromioclavicular Joint: Moderate arthropathy of the acromioclavicular joint. Type I acromion. Trace subacromial/subdeltoid bursal fluid.  Glenohumeral Joint: Mild diffuse chondral thinning without focal defect. No joint effusion.  Labrum: Grossly intact, but evaluation is limited by lack of intraarticular fluid.  Bones: No acute fracture or dislocation. No focal bone lesion.  Other: None.  IMPRESSION: 1. Calcific tendinosis and low-grade partial-thickness bursal surface tear of the distal anterior supraspinatus tendon at the footprint. 2. Moderate acromioclavicular osteoarthritis.   Electronically Signed By: Huntley Dec  Derry M.D. On: 08/22/2018 12:40 ------ EXAM: CERVICAL SPINE - 2-3 VIEW  COMPARISON:  None.  FINDINGS: There is no evidence of cervical spine fracture or prevertebral soft tissue swelling. Alignment is normal. No other significant bone abnormalities are identified.  IMPRESSION: Negative cervical spine radiographs.   Electronically Signed   By: Inge Rise M.D.   On: 07/14/2018 15:48   She reports that she has never smoked. She has never used smokeless tobacco.  Recent Labs    10/12/18 0955 05/17/19 0832  HGBA1C 5.9 6.2    Objective:  VS:  HT:     WT:    BMI:      BP:116/67   HR:69bpm   TEMP: ( )   RESP:  Physical Exam Vitals signs and nursing note reviewed.  Constitutional:      General: She is not in acute distress.     Appearance: Normal appearance. She is well-developed.  HENT:     Head: Normocephalic and atraumatic.     Nose: Nose normal.     Mouth/Throat:     Mouth: Mucous membranes are moist.     Pharynx: Oropharynx is clear.  Eyes:     Conjunctiva/sclera: Conjunctivae normal.     Pupils: Pupils are equal, round, and reactive to light.  Neck:     Musculoskeletal: Normal range of motion and neck supple. No neck rigidity or muscular tenderness.  Cardiovascular:     Rate and Rhythm: Regular rhythm.  Pulmonary:     Effort: Pulmonary effort is normal. No respiratory distress.  Abdominal:     General: There is no distension.     Palpations: Abdomen is soft.     Tenderness: There is no guarding.  Musculoskeletal:     Right lower leg: No edema.     Left lower leg: No edema.     Comments: Examination of the thoracic spine shows no overt scoliosis.  She does have some trigger points in the rhomboids and supraspinatus and levator scapula.  She has no real shoulder impingement that is meaningful or corresponding to her pain.  She has good strength in the upper extremities bilaterally.  She is a negative Hoffmann's test.  She has intact sensation in both arms.  She does have sensation across the upper back.  She ambulates without aid.  Lymphadenopathy:     Cervical: No cervical adenopathy.  Skin:    General: Skin is warm and dry.     Findings: No erythema or rash.  Neurological:     General: No focal deficit present.     Mental Status: She is alert and oriented to person, place, and time.     Motor: No abnormal muscle tone.     Coordination: Coordination normal.     Gait: Gait normal.  Psychiatric:        Mood and Affect: Mood normal.        Behavior: Behavior normal.        Thought Content: Thought content normal.     Ortho Exam Imaging: No results found.  Past Medical/Family/Surgical/Social History: Medications & Allergies reviewed per EMR, new medications updated. Patient Active Problem  List   Diagnosis Date Noted   Insomnia due to medical condition 10/18/2018   Elevated ALT measurement 10/18/2018   Insulin resistance 10/12/2018   Chronic right shoulder pain 09/11/2018   Myofascial pain syndrome 07/17/2018   Lateral epicondylitis of right elbow 06/25/2018   Radiculitis 05/25/2018   Arthritis of right hip 03/30/2018   Breast nodule  03/30/2018   Diverticulosis 03/30/2018   GERD (gastroesophageal reflux disease) 03/30/2018   Nonerosive nonspecific gastritis 03/30/2018   Hypertension 03/01/2018   Hyperlipidemia 03/01/2018   Acquired hypothyroidism 03/01/2018   Vertigo 03/01/2018   Frequent PVCs 03/01/2018   Past Medical History:  Diagnosis Date   Acquired hypothyroidism 03/01/2018   hx of goiter; s/p thyroidectomy   Anxiety    Atrial premature depolarization    BPPV (benign paroxysmal positional vertigo) 03/01/2018   Colon polyps    Essential hypertension 03/01/2018   denies hx - on diuretic for Meniere's and beta blocker for PVCs   Frequent PVCs 03/01/2018   Controlled on Flecainide, Acebutolol   GERD (gastroesophageal reflux disease)    History of cardiac catheterization    Nuc 7/16:  normal perfusion; EF 40 // LHC 8/16:  normal coronary arteries   History of depression    History of dizziness    History of echocardiogram    Echo 03/2006:  Normal LVEF   HLD (hyperlipidemia)    Hypothyroidism    IBS (irritable bowel syndrome)    Insulin resistance    Meniere's disease    Family History  Problem Relation Age of Onset   Breast cancer Mother    Heart attack Father    Pulmonary fibrosis Father    Peripheral Artery Disease Father        s/p CEA   Heart disease Father    Early death Sister    Depression Brother    Hearing loss Brother    Diabetes Daughter    Heart disease Paternal Grandmother    Colon cancer Neg Hx    Stomach cancer Neg Hx    Past Surgical History:  Procedure Laterality Date    BLADDER SURGERY  1997   BREAST BIOPSY  2013   [place]   CARDIAC CATHETERIZATION  05/2015   Carpel Tunnel Release     CHOLECYSTECTOMY  2011   COLONOSCOPY  08/20/2015   ESOPHAGOGASTRODUODENOSCOPY  01/13/2017   FOOT NEUROMA SURGERY  1986   REDUCTION MAMMAPLASTY     REFRACTIVE SURGERY     S/P Eye Right 1967   Muscle Clipped    THYROIDECTOMY  2012   TONSILLECTOMY  1975   TOTAL ABDOMINAL HYSTERECTOMY  1996   Social History   Occupational History   Occupation: Retired  Tobacco Use   Smoking status: Never Smoker   Smokeless tobacco: Never Used  Substance and Sexual Activity   Alcohol use: Yes    Comment: very limited    Drug use: Never   Sexual activity: Not on file

## 2019-07-01 ENCOUNTER — Encounter: Payer: Self-pay | Admitting: Family Medicine

## 2019-07-01 ENCOUNTER — Encounter: Payer: Self-pay | Admitting: Physical Medicine and Rehabilitation

## 2019-07-01 DIAGNOSIS — R202 Paresthesia of skin: Secondary | ICD-10-CM | POA: Insufficient documentation

## 2019-07-01 NOTE — Telephone Encounter (Signed)
Added to first message.  

## 2019-07-01 NOTE — Telephone Encounter (Signed)
Added from second message.   Separate question. Dr. Ernestina Patches increased my amitriptyline to two tablets each 25 mg at night, increased gabapentin 100 mg slowly. I'm at three 100 mg at night and one AM tablet . I'll increase  morning one in a week. He also kept me on sertraline 25 mg.  One per night. I'm sleeping better and dealing with our situation a little more calmly, I think. He took me off of trazodone 50 mg completely.  I probably have five weeks remaining on the prescription sertraline but I have no refills. What do you recommend I do? I wish only the very best for you!  I'll resend the first message in backwards order.  Sincerely,

## 2019-07-01 NOTE — Progress Notes (Signed)
Mary Hernandez - 68 y.o. female MRN [id number]  Date of birth: 1951/09/27  Office Visit Note: Visit Date: 06/30/2019 PCP: [name], DO Referred by: [name], DO  Subjective: Chief Complaint  Patient presents with   Middle Back - Follow-up   Lower Back - Follow-up   HPI: Mary Hernandez is a 68 y.o. female who comes in today For return evaluation management of upper back mainly itching sensation referred to his notalgia paresthetica.  This is been a recalcitrant issue for many years.  Since I have last seen her she has followed through with upping her amitriptyline to 50 mg at night as well as starting on gabapentin in the morning.  She has not noticed any ill effects of either 1 of those and maybe feels like it is helped some but not to a great degree.  She did try Lidoderm patches over-the-counter without any relief.  She also used a higher strength hydrocortisone that I think she was given by dermatologist and that really seems to help a little bit but not much.  She still has issue where it is mostly over the right shoulder blade between the shoulder blades but it can move around and migrate.  This is a fairly classic description.  I am not an expert on it but I have seen it a few times where people have this itching sensation.  She does have some level of generalized anxiety or situational anxiety.  She is also started on some sertraline through Dr. Juleen China.  In the interim I did look at compounded pain creams and it does appear that at least a couple of places in town will still compound these creams.  We talked about not knowing the proximal or how to get that done but we can look at that.  She is interested in that potentially.  She does not really have any other new complaints today.  In the past she has seen Dr. Teresa Coombs has had treatment for her shoulder and elbow and other musculoskeletal complaints.  I have talked to her today that if she has any of those requirements I would  really love for her to see Dr. Legrand Como hilts in our office who could provide some of the same care.  She did have PRP in the shoulder through Dr. Paulla Fore at one point.  As a side note the increased amitriptyline has helped her with getting to sleep at night.  She does take small dose of trazodone through Dr. Juleen China as well.  Had some discussion with her about serotonin syndrome although I think at these low doses is unlikely but we directed keep in mind that.  In the interim she has seen Dr. Sharol Given for her left foot and those recommendations have been made and she is following with those.  Review of Systems  Constitutional: Negative for chills, fever, malaise/fatigue and weight loss.  HENT: Negative for hearing loss and sinus pain.   Eyes: Negative for blurred vision, double vision and photophobia.  Respiratory: Negative for cough and shortness of breath.   Cardiovascular: Negative for chest pain, palpitations and leg swelling.  Gastrointestinal: Negative for abdominal pain, nausea and vomiting.  Genitourinary: Negative for flank pain.  Musculoskeletal: Positive for back pain and joint pain. Negative for myalgias.  Skin: Negative for itching and rash.  Neurological: Positive for tingling. Negative for tremors, focal weakness and weakness.  Endo/Heme/Allergies: Negative.   Psychiatric/Behavioral: Negative for depression. The patient has insomnia.   All other systems reviewed  and are negative.  Otherwise per HPI.  Assessment & Plan: Visit Diagnoses:  1. Notalgia paresthetica   2. Chronic bilateral thoracic back pain   3. Paresthesia of skin     Plan: Findings:  Chronic history of notalgia paresthetica in the upper back.  Really recalcitrant to most treatment at this point.  We are still in a phase of increasing some of the neuropathic type medications a little bit to see if that helps.  She is going to continue with the amitriptyline and increase the gabapentin slowly during the day.  We talked  about serotonin syndrome issues she is currently taking a small amount of sertraline and trazodone.  She may be able to not use the trazodone with the increased level of amitriptyline and that is what we had suggested before.  I am going to find out how to prescribe a compounded cream that will have anesthetic type medications and neuropathic medications for a trial of her upper back.  These creams were pretty fashionable a few years ago and evidently were very profitable for a lot of organizations as we used to have folks coming in trying to sell [place] that quite a bit and those have gone away at this point.  I do think compounding cream is done the right way for legitimate reasons are beneficial at times and so we will try to make inquiry into getting these compounded and see how much they cost.  She said she would also try to call and see about this.  For right now will be in a holding pattern and see her back after we try the compounded cream.    Meds & Orders: No orders of the defined types were placed in this encounter.  No orders of the defined types were placed in this encounter.   Follow-up: No follow-ups on file.   Procedures: No procedures performed  No notes on file   Clinical History: MRI OF THE RIGHT SHOULDER WITHOUT CONTRAST  TECHNIQUE: Multiplanar, multisequence MR imaging of the shoulder was performed. No intravenous contrast was administered.  COMPARISON: Right shoulder x-rays dated May 25, 2018.  FINDINGS: Rotator cuff: Small focus of T1 and T2 hypointensity in the distal anterior supraspinatus tendon at the footprint, consistent with calcification. Low-grade partial-thickness bursal surface tear of the distal anterior supraspinatus tendon at the footprint. The infraspinatus, teres minor, and subscapularis tendons are unremarkable.  Muscles: No atrophy or abnormal signal of the muscles of the rotator cuff.  Biceps long head: Intact and normally  positioned.  Acromioclavicular Joint: Moderate arthropathy of the acromioclavicular joint. Type I acromion. Trace subacromial/subdeltoid bursal fluid.  Glenohumeral Joint: Mild diffuse chondral thinning without focal defect. No joint effusion.  Labrum: Grossly intact, but evaluation is limited by lack of intraarticular fluid.  Bones: No acute fracture or dislocation. No focal bone lesion.  Other: None.  IMPRESSION: 1. Calcific tendinosis and low-grade partial-thickness bursal surface tear of the distal anterior supraspinatus tendon at the footprint. 2. Moderate acromioclavicular osteoarthritis.   Electronically Signed By: Titus Dubin M.D. On: 08/22/2018 12:40 ------ EXAM: CERVICAL SPINE - 2-3 VIEW  COMPARISON:  None.  FINDINGS: There is no evidence of cervical spine fracture or prevertebral soft tissue swelling. Alignment is normal. No other significant bone abnormalities are identified.  IMPRESSION: Negative cervical spine radiographs.   Electronically Signed   By: Inge Rise M.D.   On: 07/14/2018 15:48   She reports that she has never smoked. She has never used smokeless tobacco.  Recent  Labs    10/12/18 0955 05/17/19 0832  HGBA1C 5.9 6.2    Objective:  VS:  HT:5' 4.5" (163.8 cm)    WT:170 lb (77.1 kg)   BMI:28.74     BP:119/66   HR:76bpm   TEMP: ( )   RESP:  Physical Exam Vitals signs and nursing note reviewed.  Constitutional:      General: She is not in acute distress.    Appearance: Normal appearance. She is well-developed. She is not ill-appearing.  HENT:     Head: Normocephalic and atraumatic.  Eyes:     Conjunctiva/sclera: Conjunctivae normal.     Pupils: Pupils are equal, round, and reactive to light.  Cardiovascular:     Rate and Rhythm: Normal rate.     Pulses: Normal pulses.  Pulmonary:     Effort: Pulmonary effort is normal.  Musculoskeletal:     Right lower leg: No edema.     Left lower leg: No edema.      Comments: Patient ambulates without aid.  She has good distal strength.  She has no rash or identifiable signs of the upper back.  No focal trigger points.  Skin:    General: Skin is warm and dry.     Findings: No erythema or rash.  Neurological:     General: No focal deficit present.     Mental Status: She is alert and oriented to person, place, and time.     Sensory: No sensory deficit.     Motor: No abnormal muscle tone.     Coordination: Coordination normal.     Gait: Gait normal.  Psychiatric:        Mood and Affect: Mood normal.        Behavior: Behavior normal.     Ortho Exam Imaging: No results found.  Past Medical/Family/Surgical/Social History: Medications & Allergies reviewed per EMR, new medications updated. Patient Active Problem List   Diagnosis Date Noted   Notalgia paresthetica 07/01/2019   Insomnia due to medical condition 10/18/2018   Elevated ALT measurement 10/18/2018   Insulin resistance 10/12/2018   Chronic right shoulder pain 09/11/2018   Myofascial pain syndrome 07/17/2018   Lateral epicondylitis of right elbow 06/25/2018   Radiculitis 05/25/2018   Arthritis of right hip 03/30/2018   Breast nodule 03/30/2018   Diverticulosis 03/30/2018   GERD (gastroesophageal reflux disease) 03/30/2018   Nonerosive nonspecific gastritis 03/30/2018   Hypertension 03/01/2018   Hyperlipidemia 03/01/2018   Acquired hypothyroidism 03/01/2018   Vertigo 03/01/2018   Frequent PVCs 03/01/2018   Past Medical History:  Diagnosis Date   Acquired hypothyroidism 03/01/2018   hx of goiter; s/p thyroidectomy   Anxiety    Atrial premature depolarization    BPPV (benign paroxysmal positional vertigo) 03/01/2018   Colon polyps    Essential hypertension 03/01/2018   denies hx - on diuretic for Meniere's and beta blocker for PVCs   Frequent PVCs 03/01/2018   Controlled on Flecainide, Acebutolol   GERD (gastroesophageal reflux disease)    History  of cardiac catheterization    Nuc 7/16:  normal perfusion; EF 76 // LHC 8/16:  normal coronary arteries   History of depression    History of dizziness    History of echocardiogram    Echo 03/2006:  Normal LVEF   HLD (hyperlipidemia)    Hypothyroidism    IBS (irritable bowel syndrome)    Insulin resistance    Meniere's disease    Family History  Problem Relation Age of Onset  Breast cancer Mother    Heart attack Father    Pulmonary fibrosis Father    Peripheral Artery Disease Father        s/p CEA   Heart disease Father    Early death Sister    Depression Brother    Hearing loss Brother    Diabetes Daughter    Heart disease Paternal Grandmother    Colon cancer Neg Hx    Stomach cancer Neg Hx    Past Surgical History:  Procedure Laterality Date   BLADDER SURGERY  1997   BREAST BIOPSY  2013   BREAST REDUCTION SURGERY  1999   CARDIAC CATHETERIZATION  05/2015   Carpel Tunnel Release     CHOLECYSTECTOMY  2011   COLONOSCOPY  08/20/2015   ESOPHAGOGASTRODUODENOSCOPY  01/13/2017   FOOT NEUROMA SURGERY  1986   REDUCTION MAMMAPLASTY     REFRACTIVE SURGERY     S/P Eye Right 1967   Muscle Clipped    THYROIDECTOMY  2012   TONSILLECTOMY  1975   TOTAL ABDOMINAL HYSTERECTOMY  1996   Social History   Occupational History   Occupation: Retired  Tobacco Use   Smoking status: Never Smoker   Smokeless tobacco: Never Used  Substance and Sexual Activity   Alcohol use: Yes    Comment: very limited    Drug use: Never   Sexual activity: Not on file

## 2019-07-04 ENCOUNTER — Other Ambulatory Visit: Payer: Self-pay | Admitting: Family Medicine

## 2019-07-04 ENCOUNTER — Encounter: Payer: Self-pay | Admitting: Family Medicine

## 2019-07-04 DIAGNOSIS — R42 Dizziness and giddiness: Secondary | ICD-10-CM

## 2019-07-06 NOTE — Telephone Encounter (Signed)
See next message for completion.

## 2019-07-07 ENCOUNTER — Telehealth: Payer: Self-pay | Admitting: Physical Medicine and Rehabilitation

## 2019-07-07 ENCOUNTER — Other Ambulatory Visit: Payer: Self-pay | Admitting: Physical Medicine and Rehabilitation

## 2019-07-07 MED ORDER — AMITRIPTYLINE HCL 25 MG PO TABS: 50.0000 mg | ORAL_TABLET | Freq: Every day | ORAL | 1 refills | Status: DC

## 2019-07-08 ENCOUNTER — Encounter: Payer: Self-pay | Admitting: Physical Medicine and Rehabilitation

## 2019-07-08 ENCOUNTER — Other Ambulatory Visit: Payer: Self-pay | Admitting: Physical Medicine and Rehabilitation

## 2019-07-08 MED ORDER — AMITRIPTYLINE HCL 25 MG PO TABS: 50.0000 mg | ORAL_TABLET | Freq: Every day | ORAL | 0 refills | Status: DC

## 2019-07-08 MED ORDER — GABAPENTIN 100 MG PO CAPS: 300.0000 mg | ORAL_CAPSULE | Freq: Two times a day (BID) | ORAL | 0 refills | Status: DC

## 2019-07-12 NOTE — Telephone Encounter (Signed)
Closing encounter

## 2019-07-19 ENCOUNTER — Encounter: Payer: Self-pay | Admitting: Physical Medicine and Rehabilitation

## 2019-08-03 DIAGNOSIS — H2513 Age-related nuclear cataract, bilateral: Secondary | ICD-10-CM | POA: Diagnosis not present

## 2019-08-03 DIAGNOSIS — Z9889 Other specified postprocedural states: Secondary | ICD-10-CM | POA: Diagnosis not present

## 2019-08-03 DIAGNOSIS — H16223 Keratoconjunctivitis sicca, not specified as Sjogren's, bilateral: Secondary | ICD-10-CM | POA: Diagnosis not present

## 2019-08-03 DIAGNOSIS — H0288A Meibomian gland dysfunction right eye, upper and lower eyelids: Secondary | ICD-10-CM | POA: Diagnosis not present

## 2019-08-03 DIAGNOSIS — H0288B Meibomian gland dysfunction left eye, upper and lower eyelids: Secondary | ICD-10-CM | POA: Diagnosis not present

## 2019-08-09 ENCOUNTER — Encounter (INDEPENDENT_AMBULATORY_CARE_PROVIDER_SITE_OTHER): Payer: Self-pay | Admitting: Family Medicine

## 2019-08-09 NOTE — Telephone Encounter (Signed)
Please review

## 2019-08-14 ENCOUNTER — Other Ambulatory Visit: Payer: Self-pay | Admitting: Family Medicine

## 2019-08-14 DIAGNOSIS — F4323 Adjustment disorder with mixed anxiety and depressed mood: Secondary | ICD-10-CM

## 2019-08-15 ENCOUNTER — Encounter: Payer: Self-pay | Admitting: Family Medicine

## 2019-08-15 DIAGNOSIS — F4323 Adjustment disorder with mixed anxiety and depressed mood: Secondary | ICD-10-CM

## 2019-08-16 MED ORDER — SERTRALINE HCL 25 MG PO TABS: 25.0000 mg | ORAL_TABLET | Freq: Every day | ORAL | 0 refills | Status: DC

## 2019-09-06 ENCOUNTER — Encounter: Payer: Self-pay | Admitting: Internal Medicine

## 2019-09-06 ENCOUNTER — Ambulatory Visit (INDEPENDENT_AMBULATORY_CARE_PROVIDER_SITE_OTHER): Payer: PPO | Admitting: Internal Medicine

## 2019-09-06 ENCOUNTER — Other Ambulatory Visit: Payer: Self-pay

## 2019-09-06 VITALS — BP 120/78 | HR 81 | Ht 64.5 in | Wt 197.2 lb

## 2019-09-06 DIAGNOSIS — R079 Chest pain, unspecified: Secondary | ICD-10-CM

## 2019-09-06 DIAGNOSIS — I493 Ventricular premature depolarization: Secondary | ICD-10-CM

## 2019-09-06 NOTE — Progress Notes (Signed)
ELECTROPHYSIOLOGY Office NOTE  Patient ID: Mary Hernandez, MRN: [id number], DOB/AGE: 67-09-1951 67 y.o. Admit date: (Not on file) Date of Consult: 09/06/2019  Primary Physician: Leamon Arnt, MD Primary Cardiologist: new  ( formerly [facility]iology        HPI Mary Hernandez is a 68 y.o. female Seen to establish care in [place].  Previously seen at [facility] cardiology for symptomatic PACs and PVCs treated with flecainide.  Initally once daily then twice daily   The patient denies  shortness of breath, nocturnal dyspnea, orthopnea or peripheral edema.  There have been no palpitations, lightheadedness or syncope.  Some chest pain--Below R rib--aggravated by motion      DATE TEST EF   2/16 LHC 60-65% Coronary Art normal  6/17 Echo 75%   10/19 Calcium Score   0   DATE PR interval QRSduration Dose  4/14 170 90 0  7/18 224 112 100  9/19 218 130 100  11/20 206 132 75       Past Medical History:  Diagnosis Date  . Acquired hypothyroidism 03/01/2018   hx of goiter; s/p thyroidectomy  . Anxiety   . Atrial premature depolarization   . BPPV (benign paroxysmal positional vertigo) 03/01/2018  . Colon polyps   . Essential hypertension 03/01/2018   denies hx - on diuretic for Meniere's and beta blocker for PVCs  . Frequent PVCs 03/01/2018   Controlled on Flecainide, Acebutolol  . GERD (gastroesophageal reflux disease)   . History of cardiac catheterization    Nuc 7/16:  normal perfusion; EF 76 // LHC 8/16:  normal coronary arteries  . History of depression   . History of dizziness   . History of echocardiogram    Echo 03/2006:  Normal LVEF  . HLD (hyperlipidemia)   . Hypothyroidism   . IBS (irritable bowel syndrome)   . Insulin resistance   . Meniere's disease       Surgical History:  Past Surgical History:  Procedure Laterality Date  . BLADDER SURGERY  1997  . BREAST BIOPSY  2013  . BREAST REDUCTION SURGERY  1999  . CARDIAC  CATHETERIZATION  05/2015  . Carpel Tunnel Release    . CHOLECYSTECTOMY  2011  . COLONOSCOPY  08/20/2015  . ESOPHAGOGASTRODUODENOSCOPY  01/13/2017  . [place]  . REDUCTION MAMMAPLASTY    . REFRACTIVE SURGERY    . S/P Eye Right 1967   Muscle Clipped   . THYROIDECTOMY  2012  . TONSILLECTOMY  1975  . TOTAL ABDOMINAL HYSTERECTOMY  1996     Home Meds: Prior to Admission medications   Medication Sig Start Date End Date Taking? Authorizing Provider  acebutolol (SECTRAL) 200 MG capsule Take 1 capsule (200 mg total) by mouth daily. 05/29/18  Yes Isaiah Serge, NP  benzonatate (TESSALON) 100 MG capsule Take by mouth 3 (three) times daily as needed for cough.   Yes [provider]  Calcium Citrate-Vitamin D 315-250 MG-UNIT TABS Take by mouth.   Yes [provider]  cetirizine (ZYRTEC) 10 MG tablet Take 10 mg by mouth daily.   Yes [provider]  Evening Primrose Oil 1000 MG CAPS Take 1,300 mg by mouth.   Yes [provider]  EVENING PRIMROSE OIL PO Take 1,500 mg by mouth 2 (two) times daily.   Yes [provider]  flecainide (TAMBOCOR) 100 MG tablet Take 1 tablet (100 mg total) by mouth 2 (two) times daily. 03/27/18  Yes  [name], DO  fluticasone (FLONASE) 50 MCG/ACT nasal spray Place 2 sprays into both nostrils daily. 05/01/18  Yes [name], DO  gabapentin (NEURONTIN) 100 MG capsule Start with 1 tab po qhs X 1 week, then increase to 1 tab po bid X 1 week then 1 tab po tid prn 05/25/18  Yes Gerda Diss, DO  Glucosamine-Chondroit-Vit C-Mn (GLUCOSAMINE 1500 COMPLEX) CAPS Take by mouth.   Yes [provider]  glycopyrrolate (ROBINUL) 1 MG tablet Take 1 mg by mouth 2 (two) times daily.   Yes [provider]  levothyroxine (SYNTHROID, LEVOTHROID) 125 MCG tablet Take 1 tablet (125 mcg total) by mouth daily before breakfast. 03/27/18  Yes [name], DO  omega-3 acid ethyl esters (LOVAZA) 1 g capsule Take 1  g by mouth 2 (two) times daily.   Yes [provider]  ondansetron (ZOFRAN) 8 MG tablet Take by mouth every 8 (eight) hours as needed for nausea or vomiting.   Yes [provider]  potassium chloride (K-DUR,KLOR-CON) 10 MEQ tablet Take 1 tablet (10 mEq total) by mouth 2 (two) times daily. 03/27/18  Yes [name], DO  pravastatin (PRAVACHOL) 20 MG tablet Take 0.5 tablets (10 mg total) by mouth daily. 05/07/18  Yes [name], DO  Probiotic Product (PROBIOTIC-10 PO) Take by mouth.   Yes [provider]  Semaglutide (OZEMPIC) 1 MG/DOSE SOPN Inject 1 mg into the skin once a week. 03/23/18  Yes [name], DO  traMADol (ULTRAM) 50 MG tablet Take 1 tablet (50 mg total) by mouth every 8 (eight) hours as needed. 05/01/18  Yes [name], DO  triamterene-hydrochlorothiazide (DYAZIDE) 37.5-25 MG capsule Take 1 capsule by mouth daily.   Yes [provider]    Allergies:  Allergies  Allergen Reactions  . Adhesive [Tape] Rash    Social History   Socioeconomic History  . Marital status: Married    Spouse name: Not on file  . Number of children: 2  . Years of education: Not on file  . Highest education level: Not on file  Occupational History  . Occupation: Retired  Scientific laboratory technician  . Financial resource strain: Not on file  . Food insecurity    Worry: Not on file    Inability: Not on file  . Transportation needs    Medical: Not on file    Non-medical: Not on file  Tobacco Use  . Smoking status: Never Smoker  . Smokeless tobacco: Never Used  Substance and Sexual Activity  . Alcohol use: Yes    Comment: very limited   . Drug use: Never  . Sexual activity: Not on file  Lifestyle  . Physical activity    Days per week: Not on file    Minutes per session: Not on file  . Stress: Not on file  Relationships  . Social Herbalist on phone: Not on file    Gets together: Not on file    Attends religious service: Not on file    Active  member of club or organization: Not on file    Attends meetings of clubs or organizations: Not on file    Relationship status: Not on file  . Intimate partner violence    Fear of current or ex partner: Not on file    Emotionally abused: Not on file    Physically abused: Not on file    Forced sexual activity: Not on file  Other Topics Concern  . Not on file  Social History Narrative   Retired Licensed conveyancer   Born [place] for 63 years   Moved to [place] in 2019   Twin daughters      Family History  Problem Relation Age of Onset  . Breast cancer Mother   . Heart attack Father   . Pulmonary fibrosis Father   . Peripheral Artery Disease Father        s/p CEA  . Heart disease Father   . Early death Sister   . Depression Brother   . Hearing loss Brother   . Diabetes Daughter   . Heart disease Paternal Grandmother   . Colon cancer Neg Hx   . Stomach cancer Neg Hx      ROS:  Please see the history of present illness.     All other systems reviewed and negative.   Physical Examination  BP 120/78   Pulse 81   Ht 5' 4.5" (1.638 m)   Wt 197 lb 3.2 oz (89.4 kg)   SpO2 95%   BMI 33.33 kg/m   Well developed and nourished in no acute distress HENT normal Neck supple with JVP-  flat   Clear Regular rate and rhythm, no murmurs or gallops Abd-soft with active BS No Clubbing cyanosis edema Skin-warm and dry A & Oriented  Grossly normal sensory and motor function  ECG sinus @ 81 21/13/42 LAD IVCD  Labs: Cardiac Enzymes No results for input(s): CKTOTAL, CKMB, TROPONINI in the last 72 hours. CBC Lab Results  Component Value Date   WBC 6.2 05/17/2019   HGB 13.2 05/17/2019   HCT 39.3 05/17/2019   MCV 88.8 05/17/2019   PLT 213.0 05/17/2019   PROTIME: No results for input(s): LABPROT, INR in the last 72 hours. Chemistry No results for input(s): NA, K, CL, CO2, BUN, CREATININE, CALCIUM, PROT, BILITOT, ALKPHOS,  ALT, AST, GLUCOSE in the last 168 hours.  Invalid input(s): LABALBU Lipids Lab Results  Component Value Date   CHOL 192 05/17/2019   HDL 63.80 05/17/2019   LDLCALC 94 05/17/2019   TRIG 170.0 (H) 05/17/2019   BNP No results found for: PROBNP Thyroid Function Tests: No results for input(s): TSH, T4TOTAL, T3FREE, THYROIDAB in the last 72 hours.  Invalid input(s): FREET3 Miscellaneous No results found for: DDIMER  Radiology/Studies:  No results found.  EKG: sinus 71 22/13/44   Assessment and Plan:  PVCs  Chest pain  Flecainide doing well for suppression  Atypical chest pain, reviewed the power of 0 CA Score         Virl Axe

## 2019-09-06 NOTE — Patient Instructions (Addendum)
Medication Instructions:  Your physician recommends that you continue on your current medications as directed. Please refer to the Current Medication list given to you today.  * If you need a refill on your cardiac medications before your next appointment, please call your pharmacy.   Labwork: None ordered  Testing/Procedures: None ordered  Follow-Up: At [facility], you and your health needs are our priority.  As part of our continuing mission to provide you with exceptional heart care, we have created designated Provider Care Teams.  These Care Teams include your primary Cardiologist (physician) and Advanced Practice Providers (APPs -  Physician Assistants and Nurse Practitioners) who all work together to provide you with the care you need, when you need it.  You will need a follow up appointment in 6 months.  Please call our office 2 months in advance to schedule this appointment.  You may see Dr Caryl Comes or one of the following Advanced Practice Providers on your designated Care Team:    Chanetta Marshall, NP  Tommye Standard, PA-C  Oda Kilts, [place]  Thank you for choosing [facility]!!

## 2019-09-08 ENCOUNTER — Other Ambulatory Visit: Payer: Self-pay

## 2019-09-08 ENCOUNTER — Encounter: Payer: Self-pay | Admitting: Family Medicine

## 2019-09-08 ENCOUNTER — Other Ambulatory Visit: Payer: Self-pay | Admitting: Family Medicine

## 2019-09-08 MED ORDER — POTASSIUM CHLORIDE CRYS ER 10 MEQ PO TBCR: 10.0000 meq | EXTENDED_RELEASE_TABLET | Freq: Two times a day (BID) | ORAL | 0 refills | Status: DC

## 2019-09-13 ENCOUNTER — Encounter: Payer: Self-pay | Admitting: Family Medicine

## 2019-09-16 ENCOUNTER — Ambulatory Visit: Payer: PPO | Admitting: Internal Medicine

## 2019-10-06 ENCOUNTER — Other Ambulatory Visit: Payer: Self-pay

## 2019-10-07 ENCOUNTER — Encounter

## 2019-10-07 ENCOUNTER — Encounter: Payer: Self-pay | Admitting: Family Medicine

## 2019-10-07 ENCOUNTER — Ambulatory Visit (INDEPENDENT_AMBULATORY_CARE_PROVIDER_SITE_OTHER): Payer: PPO | Admitting: Family Medicine

## 2019-10-07 ENCOUNTER — Ambulatory Visit (INDEPENDENT_AMBULATORY_CARE_PROVIDER_SITE_OTHER): Payer: PPO

## 2019-10-07 VITALS — BP 110/4 | HR 72 | Temp 97.2°F | Ht 64.5 in | Wt 192.9 lb

## 2019-10-07 VITALS — BP 110/64 | HR 72 | Temp 97.2°F | Ht 64.5 in | Wt 193.0 lb

## 2019-10-07 DIAGNOSIS — M6530 Trigger finger, unspecified finger: Secondary | ICD-10-CM | POA: Diagnosis not present

## 2019-10-07 DIAGNOSIS — I493 Ventricular premature depolarization: Secondary | ICD-10-CM | POA: Diagnosis not present

## 2019-10-07 DIAGNOSIS — Z0000 Encounter for general adult medical examination without abnormal findings: Secondary | ICD-10-CM

## 2019-10-07 DIAGNOSIS — E8881 Metabolic syndrome: Secondary | ICD-10-CM

## 2019-10-07 DIAGNOSIS — R202 Paresthesia of skin: Secondary | ICD-10-CM

## 2019-10-07 DIAGNOSIS — G4701 Insomnia due to medical condition: Secondary | ICD-10-CM

## 2019-10-07 DIAGNOSIS — F430 Acute stress reaction: Secondary | ICD-10-CM | POA: Diagnosis not present

## 2019-10-07 DIAGNOSIS — H8109 Meniere's disease, unspecified ear: Secondary | ICD-10-CM | POA: Diagnosis not present

## 2019-10-07 DIAGNOSIS — Z8739 Personal history of other diseases of the musculoskeletal system and connective tissue: Secondary | ICD-10-CM

## 2019-10-07 DIAGNOSIS — E782 Mixed hyperlipidemia: Secondary | ICD-10-CM

## 2019-10-07 DIAGNOSIS — E669 Obesity, unspecified: Secondary | ICD-10-CM | POA: Diagnosis not present

## 2019-10-07 DIAGNOSIS — F4322 Adjustment disorder with anxiety: Secondary | ICD-10-CM | POA: Insufficient documentation

## 2019-10-07 DIAGNOSIS — E039 Hypothyroidism, unspecified: Secondary | ICD-10-CM | POA: Diagnosis not present

## 2019-10-07 LAB — CBC WITH DIFFERENTIAL/PLATELET
Basophils Absolute: 0 10*3/uL (ref 0.0–0.1)
Basophils Relative: 0.5 % (ref 0.0–3.0)
Eosinophils Absolute: 0.2 10*3/uL (ref 0.0–0.7)
Eosinophils Relative: 2.3 % (ref 0.0–5.0)
HCT: 40.7 % (ref 36.0–46.0)
Hemoglobin: 13.4 g/dL (ref 12.0–15.0)
Lymphocytes Relative: 34 % (ref 12.0–46.0)
Lymphs Abs: 2.5 10*3/uL (ref 0.7–4.0)
MCHC: 32.8 g/dL (ref 30.0–36.0)
MCV: 91.5 fl (ref 78.0–100.0)
Monocytes Absolute: 0.5 10*3/uL (ref 0.1–1.0)
Monocytes Relative: 7.5 % (ref 3.0–12.0)
Neutro Abs: 4.1 10*3/uL (ref 1.4–7.7)
Neutrophils Relative %: 55.7 % (ref 43.0–77.0)
Platelets: 250 10*3/uL (ref 150.0–400.0)
RBC: 4.45 Mil/uL (ref 3.87–5.11)
RDW: 13.8 % (ref 11.5–15.5)
WBC: 7.3 10*3/uL (ref 4.0–10.5)

## 2019-10-07 LAB — COMPREHENSIVE METABOLIC PANEL
ALT: 24 U/L (ref 0–35)
AST: 22 U/L (ref 0–37)
Albumin: 4.4 g/dL (ref 3.5–5.2)
Alkaline Phosphatase: 69 U/L (ref 39–117)
BUN: 12 mg/dL (ref 6–23)
CO2: 32 mEq/L (ref 19–32)
Calcium: 9.4 mg/dL (ref 8.4–10.5)
Chloride: 100 mEq/L (ref 96–112)
Creatinine, Ser: 0.69 mg/dL (ref 0.40–1.20)
GFR: 84.79 mL/min (ref >60.00)
Glucose, Bld: 93 mg/dL (ref 70–99)
Potassium: 3.8 mEq/L (ref 3.5–5.1)
Sodium: 140 mEq/L (ref 135–145)
Total Bilirubin: 0.4 mg/dL (ref 0.2–1.2)
Total Protein: 7.1 g/dL (ref 6.0–8.3)

## 2019-10-07 LAB — POCT GLYCOSYLATED HEMOGLOBIN (HGB A1C): Hemoglobin A1C: 5.9 % — AB (ref 4.0–5.6)

## 2019-10-07 LAB — TSH: TSH: 0.96 u[IU]/mL (ref 0.35–4.50)

## 2019-10-07 MED ORDER — OZEMPIC (1 MG/DOSE) 2 MG/1.5ML ~~LOC~~ SOPN: 1.0000 mg | PEN_INJECTOR | SUBCUTANEOUS | 3 refills | Status: AC

## 2019-10-07 NOTE — Progress Notes (Signed)
Subjective:   Mary Hernandez is a 68 y.o. female who presents for an Initial Medicare Annual Wellness Visit.  Review of Systems     Cardiac Risk Factors include: advanced age (>78men, >93 women);diabetes mellitus;hypertension    Objective:    Today's Vitals   10/07/19 1012  BP: (!) 110/4  Pulse: 72  Temp: (!) 97.2 F (36.2 C)  TempSrc: Temporal  Weight: 192 lb 14.4 oz (87.5 kg)  Height: 5' 4.5" (1.638 m)   Body mass index is 32.6 kg/m.  Advanced Directives 10/07/2019 11/15/2018  Does Patient Have a Medical Advance Directive? Yes No  Type of Advance Directive Living will;Healthcare Power of Attorney -  Does patient want to make changes to medical advance directive? No - Patient declined -  Copy of [place] in Chart? Yes - validated most recent copy scanned in chart (See row information) -  Would patient like information on creating a medical advance directive? - No - Patient declined    Current Medications (verified) Outpatient Encounter Medications as of 10/07/2019  Medication Sig  . acebutolol (SECTRAL) 200 MG capsule Take 1 capsule (200 mg total) by mouth daily.  . Calcium Citrate-Vitamin D 315-250 MG-UNIT TABS Take by mouth.  . cetirizine (ZYRTEC) 10 MG tablet Take 10 mg by mouth daily.  Mar[place] Kitchen DYAZIDE 37.5-25 MG capsule TAKE ONE CAPSULE BY MOUTH DAILY. MUST USE BRAND NAME  . EVENING PRIMROSE OIL PO Take 1,500 mg by mouth 2 (two) times daily.  . flecainide (TAMBOCOR) 50 MG tablet Take 1.5 tablets (75 mg total) by mouth 2 (two) times daily.  . fluticasone (FLONASE) 50 MCG/ACT nasal spray Place 2 sprays into both nostrils daily.  . Glucosamine-Chondroit-Vit C-Mn (GLUCOSAMINE 1500 COMPLEX) CAPS Take by mouth.  Mar[place] Kitchen glycopyrrolate (ROBINUL) 1 MG tablet TAKE ONE TABLET BY MOUTH TWICE A DAY  . hydrocortisone (ANUSOL-HC) 2.5 % rectal cream   . hydrocortisone 1 % lotion Apply 1 application topically 2 (two) times daily.  . hydroxypropyl methylcellulose /  hypromellose (ISOPTO TEARS / GONIOVISC) 2.5 % ophthalmic solution Place 1 drop into both eyes 4 (four) times daily as needed for dry eyes.  Marland Kitchen levothyroxine (SYNTHROID) 112 MCG tablet Take 1 tablet (112 mcg total) by mouth daily.  Marland Kitchen omega-3 acid ethyl esters (LOVAZA) 1 g capsule Take 1 g by mouth 2 (two) times daily.  . potassium chloride (KLOR-CON) 10 MEQ tablet Take 1 tablet (10 mEq total) by mouth 2 (two) times daily.  . pravastatin (PRAVACHOL) 20 MG tablet TAKE ONE TABLET BY MOUTH DAILY  . Probiotic Product (PROBIOTIC-10 PO) Take by mouth.  . sertraline (ZOLOFT) 25 MG tablet Take 1 tablet (25 mg total) by mouth daily.  . [DISCONTINUED] amitriptyline (ELAVIL) 25 MG tablet Take 2 tablets (50 mg total) by mouth at bedtime.  . [DISCONTINUED] gabapentin (NEURONTIN) 100 MG capsule Take 3 capsules (300 mg total) by mouth 2 (two) times daily. (Patient taking differently: Take 100 mg by mouth 3 (three) times daily. )  . [DISCONTINUED] Semaglutide, 1 MG/DOSE, (OZEMPIC, 1 MG/DOSE,) 2 MG/1.5ML SOPN Inject 1 mg into the skin once a week.  . [DISCONTINUED] sertraline (ZOLOFT) 25 MG tablet TAKE ONE TABLET BY MOUTH DAILY  . [DISCONTINUED] traZODone (DESYREL) 50 MG tablet Take 0.5-1 tablets (25-50 mg total) by mouth at bedtime as needed for sleep. (Patient not taking: Reported on 10/07/2019)   No facility-administered encounter medications on file as of 10/07/2019.    Allergies (verified) Adhesive [tape]   History: Past Medical  History:  Diagnosis Date  . Acquired hypothyroidism 03/01/2018   hx of goiter; s/p thyroidectomy  . Anxiety   . Atrial premature depolarization   . BPPV (benign paroxysmal positional vertigo) 03/01/2018  . Colon polyps   . Essential hypertension 03/01/2018   denies hx - on diuretic for Meniere's and beta blocker for PVCs  . Frequent PVCs 03/01/2018   Controlled on Flecainide, Acebutolol  . GERD (gastroesophageal reflux disease)   . History of cardiac catheterization    Nuc  7/16:  normal perfusion; EF 76 // LHC 8/16:  normal coronary arteries  . History of depression   . History of dizziness   . History of echocardiogram    Echo 03/2006:  Normal LVEF  . HLD (hyperlipidemia)   . Hypothyroidism   . IBS (irritable bowel syndrome)   . Insulin resistance   . Meniere's disease    Past Surgical History:  Procedure Laterality Date  . BLADDER SURGERY  1997  . BREAST BIOPSY  2013  . BREAST REDUCTION SURGERY  1999  . CARDIAC CATHETERIZATION  05/2015  . Carpel Tunnel Release    . CHOLECYSTECTOMY  2011  . COLONOSCOPY  08/20/2015  . ESOPHAGOGASTRODUODENOSCOPY  01/13/2017  . [place]  . REDUCTION MAMMAPLASTY    . REFRACTIVE SURGERY    . S/P Eye Right 1967   Muscle Clipped   . THYROIDECTOMY  2012  . TONSILLECTOMY  1975  . TOTAL ABDOMINAL HYSTERECTOMY  1996   Family History  Problem Relation Age of Onset  . Breast cancer Mother   . Heart attack Father   . Pulmonary fibrosis Father   . Peripheral Artery Disease Father        s/p CEA  . Heart disease Father   . Early death Sister   . Depression Brother   . Hearing loss Brother   . Diabetes Daughter   . Heart disease Paternal Grandmother   . Colon cancer Neg Hx   . Stomach cancer Neg Hx    Social History   Socioeconomic History  . Marital status: Married    Spouse name: Not on file  . Number of children: 2  . Years of education: Not on file  . Highest education level: Not on file  Occupational History  . Occupation: Retired  Tobacco Use  . Smoking status: Never Smoker  . Smokeless tobacco: Never Used  Substance and Sexual Activity  . Alcohol use: Yes    Comment: very limited   . Drug use: Never  . Sexual activity: Not on file  Other Topics Concern  . Not on file  Social History Narrative   Retired Licensed conveyancer   Born [place] up in [place] in [place] for [place] years   Moved to [place] in 2019   Twin daughters (one daughter is diabetic  Tourist information centre manager)    Social Determinants of Radio broadcast assistant Strain:   . Difficulty of Paying Living Expenses: Not on file  Food Insecurity:   . Worried About Charity fundraiser in the Last Year: Not on file  . Ran Out of Food in the Last Year: Not on file  Transportation Needs:   . Lack of Transportation (Medical): Not on file  . Lack of Transportation (Non-Medical): Not on file  Physical Activity:   . Days of Exercise per Week: Not on file  . Minutes of Exercise per Session: Not on file  Stress:   .  Feeling of Stress : Not on file  Social Connections:   . Frequency of Communication with Friends and Family: Not on file  . Frequency of Social Gatherings with Friends and Family: Not on file  . Attends Religious Services: Not on file  . Active Member of Clubs or Organizations: Not on file  . Attends Archivist Meetings: Not on file  . Marital Status: Not on file    Tobacco Counseling Counseling given: Not Answered   Clinical Intake:  Pre-visit preparation completed: Yes  Pain : No/denies pain  Diabetes: Yes CBG done?: Yes CBG resulted in Enter/ Edit results?: Yes Did pt. bring in CBG monitor from home?: No  How often do you need to have someone help you when you read instructions, pamphlets, or other written materials from your doctor or pharmacy?: 1 - Never  Interpreter Needed?: No  Information entered by :: Denman George LPN   Activities of Daily Living In your present state of health, do you have any difficulty performing the following activities: 10/07/2019  Hearing? N  Vision? N  Difficulty concentrating or making decisions? N  Walking or climbing stairs? N  Dressing or bathing? N  Doing errands, shopping? N  Preparing Food and eating ? N  Using the Toilet? N  In the past six months, have you accidently leaked urine? N  Do you have problems with loss of bowel control? N  Managing your Medications? N  Managing your Finances? N   Housekeeping or managing your Housekeeping? N  Some recent data might be hidden     Immunizations and Health Maintenance Immunization History  Administered Date(s) Administered  . Fluad Quad(high Dose 65+) 06/08/2019  . Hepatitis A 06/18/2004  . Hepatitis B 06/18/2004  . Influenza, High Dose Seasonal PF 06/16/2018  . Pneumococcal Conjugate-13 09/14/2015  . Pneumococcal Polysaccharide-23 06/10/2017  . Tdap 06/18/2017  . Zoster 08/22/2010  . Zoster Recombinat (Shingrix) 06/18/2017, 11/06/2017   There are no preventive care reminders to display for this patient.  Patient Care Team: Leamon Arnt, MD as PCP - General (Family Medicine) Gerda Diss, DO as Consulting Physician (Sports Medicine) Debbra Riding, MD as Consulting Physician (Ophthalmology) Deboraha Sprang, MD as Consulting Physician (Cardiology) Magnus Sinning, MD as Consulting Physician (Physical Medicine and Rehabilitation)  Indicate any recent Medical Services you may have received from other than [facility] providers in the past year (date may be approximate).     Assessment:   This is a routine wellness examination for [facility].  Hearing/Vision screen No exam data present  Dietary issues and exercise activities discussed: Current Exercise Habits: Structured exercise class;Home exercise routine, Type of exercise: yoga;Other - see comments(pilates), Time (Minutes): 40, Frequency (Times/Week): 5, Weekly Exercise (Minutes/Week): 200, Intensity: Moderate  Goals   None    Depression Screen PHQ 2/9 Scores 10/07/2019 05/19/2019 10/12/2018 02/25/2018  PHQ - 2 Score 0 0 0 0  PHQ- 9 Score - 3 - -    Fall Risk Fall Risk  10/07/2019 12/30/2018 12/30/2018 02/25/2018  Falls in the past year? 0 0 0 No  Number falls in past yr: 0 0 0 -  Injury with Fall? - 0 0 -    Is the patient's home free of loose throw rugs in walkways, pet beds, electrical cords, etc?   yes      Grab bars in the bathroom? yes      Handrails on  the stairs?   yes      Adequate lighting?  yes  Timed Get Up and Go Performed completed and within normal timeframe; no gait abnormalities noted    Cognitive Function: no cognitive concerns at this time  Cognitive Testing  Alert? Yes         Normal Appearance? Yes  Oriented to person? Yes           Place? Yes  Time? Yes  Recall of three objects? Yes  Can perform simple calculations? Yes  Displays appropriate judgment? Yes  Can read the correct time from a watch face? Yes   Screening Tests Health Maintenance  Topic Date Due  . MAMMOGRAM  06/07/2020  . PNA vac Low Risk Adult (2 of 2 - PPSV23) 06/10/2022  . DEXA SCAN  05/29/2023  . COLONOSCOPY  08/11/2023  . TETANUS/TDAP  06/19/2027  . INFLUENZA VACCINE  Completed  . Hepatitis C Screening  Completed    Qualifies for Shingles Vaccine? Shingrix completed   Cancer Screenings: Lung: Low Dose CT Chest recommended if Age 12-80 years, 30 pack-year currently smoking OR have quit w/in 15years. Patient does not qualify. Breast: Up to date on Mammogram? Yes   Up to date of Bone Density/Dexa? Yes Colorectal: colonoscopy 08/10/18     Plan:  I have personally reviewed and addressed the Medicare Annual Wellness questionnaire and have noted the following in the patient's chart:  A. Medical and social history B. Use of alcohol, tobacco or illicit drugs  C. Current medications and supplements D. Functional ability and status E.  Nutritional status F.  Physical activity G. Advance directives H. List of other physicians I.  Hospitalizations, surgeries, and ER visits in previous 12 months J.  [place] such as hearing and vision if needed, cognitive and depression L. Referrals, records requested, and appointments- none   In addition, I have reviewed and discussed with patient certain preventive protocols, quality metrics, and best practice recommendations. A written personalized care plan for preventive services as well as  general preventive health recommendations were provided to patient.   Signed,  Denman George, LPN  Nurse Health Advisor   Nurse Notes: no additional

## 2019-10-07 NOTE — Progress Notes (Signed)
Subjective  CC:  Chief Complaint  Patient presents with  . Transitions Of Care    Transitioning care from Dr. Juleen China  . Left hand    right finger and middle finger pain    HPI: Mary Hernandez is a 68 y.o. female who presents to [facility] at [facility] today to establish care with me as a new patient.   She has the following concerns or needs:  Very pleasant 68 yo female with multiple medical problems which we reviewed in detail and I updated her chart and PL accordingly.   Hypothyroidism due for recheck. Denies sxs of low or high thyroid.   IR and obesity on ozempic for weight loss; doing well and would like to continue. Requests refill. No AEs.   Notalgia paresthetica failed multilple treatments and remains bothersome  H/o shoulder RCT and hip pain now much improved with PT and SM.   menieres' disease on maxzide with low bps , asymptomatic. Sees ENT  Insomnia   New problem: middle finger of left hand with difficulty closing and occ locking over the last several months. No injury.   Stress reaction doing much better on meds.   Frequent PVCs on medication, well controlled and evaluated by cards  HLD due for recheck on statin  Assessment  1. Acquired hypothyroidism   2. Frequent PVCs   3. Mixed hyperlipidemia   4. Notalgia paresthetica   5. Insulin resistance   6. Meniere's disease, unspecified laterality   7. Insomnia due to medical condition   8. Obesity, Class I, BMI 30-34.9   9. Trigger finger of left hand, unspecified finger   10. Stress reaction   11. History of rotator cuff syndrome with tear and adhesive capsulitis      Plan   Recheck labs for htn, hld, low thyroid and prediabetes. Educated fully. Continue ozempic for weight loss. Discussed healthy diet.   Notalgia paresthetica: trial of Itch-x (antihistamine, topical). Stop gabapentin and elavil: neither have been helpful.  Meniere's currently stable. On cocktail. Monitor Bps; consider  changing to low dose hctz since low BP. Also monitor low potassium: lower than expected requiring supplement on dyazide.   Sleep: cbd and melatonin. Monitor sleep of gabapentin and elavil as both could have been helping.   Stress reaction now controlled on sertraline in maintenance phase. Reassess in 6-9 months. May be able to stop it.   OA and bursitis: now improved. No chronic pain syndromes identified.   Trigger finger: pt declines injection: rest rest, ice and topical voltaren  Follow up:  Return for cpe, pt's convenience Orders Placed This Encounter  Procedures  . CBC w/Diff  . CMP  . TSH  . A1C POCT   Meds ordered this encounter  Medications  . Semaglutide, 1 MG/DOSE, (OZEMPIC, 1 MG/DOSE,) 2 MG/1.5ML SOPN    Sig: Inject 1 mg into the skin once a week.    Dispense:  6 pen    Refill:  3     Depression screen [facility] 2/9 10/07/2019 05/19/2019 10/12/2018 02/25/2018  Decreased Interest 0 0 0 0  Down, Depressed, Hopeless 0 0 0 0  PHQ - 2 Score 0 0 0 0  Altered sleeping - 2 - -  Tired, decreased energy - 1 - -  Change in appetite - 0 - -  Feeling bad or failure about yourself  - 0 - -  Trouble concentrating - 0 - -  Moving slowly or fidgety/restless - 0 - -  Suicidal  thoughts - 0 - -  PHQ-9 Score - 3 - -  Difficult doing work/chores - Not difficult at all - -    We updated and reviewed the patient's past history in detail and it is documented below.  Patient Active Problem List   Diagnosis Date Noted  . Obesity, Class I, BMI 30-34.9 10/07/2019    Priority: High  . Insomnia due to medical condition 10/18/2018    Priority: High  . Prediabetes 10/12/2018    Priority: High  . Mixed hyperlipidemia 03/01/2018    Priority: High  . Acquired hypothyroidism 03/01/2018    Priority: High  . Frequent PVCs 03/01/2018    Priority: High  . Stress reaction 10/07/2019    Priority: Medium    2020 responsive to sertraline   . GERD (gastroesophageal reflux disease) 03/30/2018     Priority: Medium  . Meniere disease 03/01/2018    Priority: Medium  . Notalgia paresthetica 07/01/2019    Priority: Low  . Diverticulosis 03/30/2018    Priority: Low  . History of rotator cuff syndrome with tear and adhesive capsulitis 09/11/2018    MRI R shoulder 08/22/18: IMPRESSION: 1. Calcific tendinosis and low-grade partial-thickness bursal surface tear of the distal anterior supraspinatus tendon at the footprint. 2. Moderate acromioclavicular osteoarthritis.  10/05/18 - PRP inj   . History of lateral epicondylitis of right elbow 06/25/2018    MRI R elbow 08/22/18: IMPRESSION: 1. Partial tear of the common forearm extensor tendon origin.  09/25/18 - PRP inj     Health Maintenance  Topic Date Due  . MAMMOGRAM  06/07/2020  . PNA vac Low Risk Adult (2 of 2 - PPSV23) 06/10/2022  . DEXA SCAN  05/29/2023  . COLONOSCOPY  08/11/2023  . TETANUS/TDAP  06/19/2027  . INFLUENZA VACCINE  Completed  . Hepatitis C Screening  Completed   Immunization History  Administered Date(s) Administered  . Fluad Quad(high Dose 65+) 06/08/2019  . Hepatitis A 06/18/2004  . Hepatitis B 06/18/2004  . Influenza, High Dose Seasonal PF 06/16/2018  . Pneumococcal Conjugate-13 09/14/2015  . Pneumococcal Polysaccharide-23 06/10/2017  . Tdap 06/18/2017  . Zoster 08/22/2010  . Zoster Recombinat (Shingrix) 06/18/2017, 11/06/2017   Current Meds  Medication Sig  . acebutolol (SECTRAL) 200 MG capsule Take 1 capsule (200 mg total) by mouth daily.  . Calcium Citrate-Vitamin D 315-250 MG-UNIT TABS Take by mouth.  . cetirizine (ZYRTEC) 10 MG tablet Take 10 mg by mouth daily.  Marland Kitchen DYAZIDE 37.5-25 MG capsule TAKE ONE CAPSULE BY MOUTH DAILY. MUST USE BRAND NAME  . EVENING PRIMROSE OIL PO Take 1,500 mg by mouth 2 (two) times daily.  . flecainide (TAMBOCOR) 50 MG tablet Take 1.5 tablets (75 mg total) by mouth 2 (two) times daily.  . fluticasone (FLONASE) 50 MCG/ACT nasal spray Place 2 sprays into both nostrils  daily.  . Glucosamine-Chondroit-Vit C-Mn (GLUCOSAMINE 1500 COMPLEX) CAPS Take by mouth.  Marland Kitchen glycopyrrolate (ROBINUL) 1 MG tablet TAKE ONE TABLET BY MOUTH TWICE A DAY  . hydroxypropyl methylcellulose / hypromellose (ISOPTO TEARS / GONIOVISC) 2.5 % ophthalmic solution Place 1 drop into both eyes 4 (four) times daily as needed for dry eyes.  Marland Kitchen levothyroxine (SYNTHROID) 112 MCG tablet Take 1 tablet (112 mcg total) by mouth daily.  Marland Kitchen omega-3 acid ethyl esters (LOVAZA) 1 g capsule Take 1 g by mouth 2 (two) times daily.  . potassium chloride (KLOR-CON) 10 MEQ tablet Take 1 tablet (10 mEq total) by mouth 2 (two) times daily.  . pravastatin (PRAVACHOL)  20 MG tablet TAKE ONE TABLET BY MOUTH DAILY  . Probiotic Product (PROBIOTIC-10 PO) Take by mouth.  . Semaglutide, 1 MG/DOSE, (OZEMPIC, 1 MG/DOSE,) 2 MG/1.5ML SOPN Inject 1 mg into the skin once a week.  . sertraline (ZOLOFT) 25 MG tablet Take 1 tablet (25 mg total) by mouth daily.  . [DISCONTINUED] amitriptyline (ELAVIL) 25 MG tablet Take 2 tablets (50 mg total) by mouth at bedtime.  . [DISCONTINUED] gabapentin (NEURONTIN) 100 MG capsule Take 3 capsules (300 mg total) by mouth 2 (two) times daily. (Patient taking differently: Take 100 mg by mouth 3 (three) times daily. )  . [DISCONTINUED] Semaglutide, 1 MG/DOSE, (OZEMPIC, 1 MG/DOSE,) 2 MG/1.5ML SOPN Inject 1 mg into the skin once a week.  . [DISCONTINUED] sertraline (ZOLOFT) 25 MG tablet TAKE ONE TABLET BY MOUTH DAILY    Allergies: Patient is allergic to adhesive [tape]. Past Medical History Patient  has a past medical history of Acquired hypothyroidism (03/01/2018), Anxiety, Atrial premature depolarization, BPPV (benign paroxysmal positional vertigo) (03/01/2018), Colon polyps, Essential hypertension (03/01/2018), Frequent PVCs (03/01/2018), GERD (gastroesophageal reflux disease), History of cardiac catheterization, History of depression, History of dizziness, History of echocardiogram, HLD  (hyperlipidemia), Hypothyroidism, IBS (irritable bowel syndrome), Insulin resistance, and Meniere's disease. Past Surgical History Patient  has a past surgical history that includes S/P Eye (Right, 1967); Tonsillectomy (1975); Foot neuroma surgery (1986); Total abdominal hysterectomy (1996); Breast reduction surgery (1999); Cholecystectomy (2011); Thyroidectomy (2012); Breast biopsy (2013); Carpel Tunnel Release; Refractive surgery; Cardiac catheterization (05/2015); Reduction mammaplasty; Bladder surgery (1997); Colonoscopy (08/20/2015); and Esophagogastroduodenoscopy (01/13/2017). Family History: Patient family history includes Breast cancer in her mother; Depression in her brother; Diabetes in her daughter; Early death in her sister; Hearing loss in her brother; Heart attack in her father; Heart disease in her father and paternal grandmother; Peripheral Artery Disease in her father; Pulmonary fibrosis in her father. Social History:  Patient  reports that she has never smoked. She has never used smokeless tobacco. She reports current alcohol use. She reports that she does not use drugs.  Review of Systems: Constitutional: negative for fever or malaise  Patient Care Team    Relationship Specialty Notifications Start End  Leamon Arnt, MD PCP - General Family Medicine  09/06/19   Gerda Diss, DO Consulting Physician Sports Medicine  05/25/18   Debbra Riding, MD Consulting Physician Ophthalmology  06/09/18   Deboraha Sprang, MD Consulting Physician Cardiology  10/07/19   Magnus Sinning, MD Consulting Physician Physical Medicine and Rehabilitation  10/07/19     Objective  Vitals: BP 110/64 (BP Location: Right Arm, Patient Position: Sitting, Cuff Size: Normal)   Pulse 72   Temp (!) 97.2 F (36.2 C) (Temporal)   Ht 5' 4.5" (1.638 m)   Wt 193 lb (87.5 kg)   SpO2 91%   BMI 32.62 kg/m  General:  Well developed, well nourished, no acute distress  Psych:  Alert and oriented,normal  mood and affect Cardiovascular:  RRR without gallop, rub or murmur, nondisplaced PMI Respiratory:  Good breath sounds bilaterally, CTAB with normal respiratory effort MSK: left middle finger with ttp over MCP and mild locking, OA changes present in hands Skin:  Warm, no rashes or suspicious lesions noted Neurologic:    Mental status is normal.    Commons side effects, risks, benefits, and alternatives for medications and treatment plan prescribed today were discussed, and the patient expressed understanding of the given instructions. Patient is instructed to call or message via MyChart if he/she has  any questions or concerns regarding our treatment plan. No barriers to understanding were identified. We discussed Red Flag symptoms and signs in detail. Patient expressed understanding regarding what to do in case of urgent or emergency type symptoms.   Medication list was reconciled, printed and provided to the patient in AVS. Patient instructions and summary information was reviewed with the patient as documented in the AVS. This note was prepared with assistance of Dragon voice recognition software. Occasional wrong-word or sound-a-like substitutions may have occurred due to the inherent limitations of voice recognition software  This visit occurred during the SARS-CoV-2 public health emergency.  Safety protocols were in place, including screening questions prior to the visit, additional usage of staff PPE, and extensive cleaning of exam room while observing appropriate contact time as indicated for disinfecting solutions.

## 2019-10-07 NOTE — Patient Instructions (Signed)
Please return for your annual complete physical; please come fasting.  I will release your lab results to you on your MyChart account with further instructions. Please reply with any questions.   It was a pleasure meeting you today! Thank you for choosing [place] to meet your healthcare needs! I truly look forward to working with you. If you have any questions or concerns, please send me a message via Mychart or call the office at [phone].  Try ITCH-X available at [facility].    Trigger Finger  Trigger finger, also called stenosing tenosynovitis,  is a condition that causes a finger to get stuck in a bent position. Each finger has a tendon, which is a tough, cord-like tissue that connects muscle to bone, and each tendon passes through a tunnel of tissue called a tendon sheath. To move your finger, your tendon needs to glide freely through the sheath. Trigger finger happens when the tendon or the sheath thickens, making it difficult to move your finger. Trigger finger can affect any finger or a thumb. It may affect more than one finger. Mild cases may clear up with rest and medicine. Severe cases require more treatment. What are the causes? Trigger finger is caused by a thickened finger tendon or tendon sheath. The cause of this thickening is not known. What increases the risk? The following factors may make you more likely to develop this condition:  Doing activities that require a strong grip.  Having rheumatoid arthritis, gout, or diabetes.  Being 29-35 years old.  Being female. What are the signs or symptoms? Symptoms of this condition include:  Pain when bending or straightening your finger.  Tenderness or swelling where your finger attaches to the palm of your hand.  A lump in the palm of your hand or on the inside of your finger.  Hearing a noise like a pop or a snap when you try to straighten your finger.  Feeling a catching or locking sensation when you try to straighten  your finger.  Being unable to straighten your finger. How is this diagnosed? This condition is diagnosed based on your symptoms and a physical exam. How is this treated? This condition may be treated by:  Resting your finger and avoiding activities that make symptoms worse.  Wearing a finger splint to keep your finger extended.  Taking NSAIDs, such as ibuprofen, to relieve pain and swelling.  Doing gentle exercises to stretch the finger as told by your health care provider.  Having medicine that reduces swelling and inflammation (steroids) injected into the tendon sheath. Injections may need to be repeated.  Having surgery to open the tendon sheath. This may be done if other treatments do not work and you cannot straighten your finger. You may need physical therapy after surgery. Follow these instructions at home: If you have a splint:  Wear the splint as told by your health care provider. Remove it only as told by your health care provider.  Loosen it if your fingers tingle, become numb, or turn cold and blue.  Keep it clean.  If the splint is not waterproof: ? Do not let it get wet. ? Cover it with a watertight covering when you take a bath or shower. Managing pain, stiffness, and swelling     If directed, apply heat to the affected area as often as told by your health care provider. Use the heat source that your health care provider recommends, such as a moist heat pack or a heating pad.  Place  a towel between your skin and the heat source.  Leave the heat on for 20-30 minutes.  Remove the heat if your skin turns bright red. This is especially important if you are unable to feel pain, heat, or cold. You may have a greater risk of getting burned. If directed, put ice on the painful area. To do this:  If you have a removable splint, remove it as told by your health care provider.  Put ice in a plastic bag.  Place a towel between your skin and the bag or between your  splint and the bag.  Leave the ice on for 20 minutes, 2-3 times a day.  Activity  Rest your finger as told by your health care provider. Avoid activities that make the pain worse.  Return to your normal activities as told by your health care provider. Ask your health care provider what activities are safe for you.  Do exercises as told by your health care provider.  Ask your health care provider when it is safe to drive if you have a splint on your hand. General instructions  Take over-the-counter and prescription medicines only as told by your health care provider.  Keep all follow-up visits as told by your health care provider. This is important. Contact a health care provider if:  Your symptoms are not improving with home care. Summary  Trigger finger, also called stenosing tenosynovitis, causes your finger to get stuck in a bent position. This can make it difficult and painful to straighten your finger.  This condition develops when a finger tendon or tendon sheath thickens.  Treatment may include resting your finger, wearing a splint, and taking medicines.  In severe cases, surgery to open the tendon sheath may be needed. This information is not intended to replace advice given to you by your health care provider. Make sure you discuss any questions you have with your health care provider. Document Revised: 02/08/2019 Document Reviewed: 02/08/2019 Elsevier Patient Education  [place].

## 2019-10-07 NOTE — Patient Instructions (Signed)
Mary Hernandez , Thank you for taking time to come for your Medicare Wellness Visit. I appreciate your ongoing commitment to your health goals. Please review the following plan we discussed and let me know if I can assist you in the future.   Screening recommendations/referrals: Colorectal Screening: up to date; last 08/10/18 Mammogram: up to date; last 06/08/19 Bone Density: up to date; last 05/28/18   Vision and Dental Exams: Recommended annual ophthalmology exams for early detection of glaucoma and other disorders of the eye Recommended annual dental exams for proper oral hygiene  Diabetic Exams: Diabetic Eye Exam: recommended yearly  Diabetic Foot Exam: recommended yearly   Vaccinations: Influenza vaccine: completed 06/08/19 Pneumococcal vaccine: up to date; last 06/10/17 Tdap vaccine: up to date; last 06/18/17 Shingles vaccine: Shingrix completed   Advanced directives: We have received a copy of your POA (Power of [place]) and/or Living Will. These documents can be located in your chart.  Goals: Recommend to drink at least 6-8 8oz glasses of water per day and consume a balanced diet rich in fresh fruits and vegetables.   Next appointment: Please schedule your Annual Wellness Visit with your Nurse Health Advisor in one year.  Preventive Care 68 Years and Older, Female Preventive care refers to lifestyle choices and visits with your health care provider that can promote health and wellness. What does preventive care include?  A yearly physical exam. This is also called an annual well check.  Dental exams once or twice a year.  Routine eye exams. Ask your health care provider how often you should have your eyes checked.  Personal lifestyle choices, including:  Daily care of your teeth and gums.  Regular physical activity.  Eating a healthy diet.  Avoiding tobacco and drug use.  Limiting alcohol use.  Practicing safe sex.  Taking low-dose aspirin every day if recommended by  your health care provider.  Taking vitamin and mineral supplements as recommended by your health care provider. What happens during an annual well check? The services and screenings done by your health care provider during your annual well check will depend on your age, overall health, lifestyle risk factors, and family history of disease. Counseling  Your health care provider may ask you questions about your:  Alcohol use.  Tobacco use.  Drug use.  Emotional well-being.  Home and relationship well-being.  Sexual activity.  Eating habits.  History of falls.  Memory and ability to understand (cognition).  Work and work Statistician.  Reproductive health. Screening  You may have the following tests or measurements:  Height, weight, and BMI.  Blood pressure.  Lipid and cholesterol levels. These may be checked every 5 years, or more frequently if you are over 76 years old.  Skin check.  Lung cancer screening. You may have this screening every year starting at age 64 if you have a 30-pack-year history of smoking and currently smoke or have quit within the past 15 years.  Fecal occult blood test (FOBT) of the stool. You may have this test every year starting at age 57.  Flexible sigmoidoscopy or colonoscopy. You may have a sigmoidoscopy every 5 years or a colonoscopy every 10 years starting at age 74.  Hepatitis C blood test.  Hepatitis B blood test.  Sexually transmitted disease (STD) testing.  Diabetes screening. This is done by checking your blood sugar (glucose) after you have not eaten for a while (fasting). You may have this done every 1-3 years.  Bone density scan. This is done  to screen for osteoporosis. You may have this done starting at age 65.  Mammogram. This may be done every 1-2 years. Talk to your health care provider about how often you should have regular mammograms. Talk with your health care provider about your test results, treatment options, and  if necessary, the need for more tests. Vaccines  Your health care provider may recommend certain vaccines, such as:  Influenza vaccine. This is recommended every year.  Tetanus, diphtheria, and acellular pertussis (Tdap, Td) vaccine. You may need a Td booster every 10 years.  Zoster vaccine. You may need this after age 22.  Pneumococcal 13-valent conjugate (PCV13) vaccine. One dose is recommended after age 18.  Pneumococcal polysaccharide (PPSV23) vaccine. One dose is recommended after age 5. Talk to your health care provider about which screenings and vaccines you need and how often you need them. This information is not intended to replace advice given to you by your health care provider. Make sure you discuss any questions you have with your health care provider. Document Released: 10/20/2015 Document Revised: 06/12/2016 Document Reviewed: 07/25/2015 Elsevier Interactive Patient Education  2017 [place] Prevention in the Home Falls can cause injuries. They can happen to people of all ages. There are many things you can do to make your home safe and to help prevent falls. What can I do on the outside of my home?  Regularly fix the edges of walkways and driveways and fix any cracks.  Remove anything that might make you trip as you walk through a door, such as a raised step or threshold.  Trim any bushes or trees on the path to your home.  Use bright outdoor lighting.  Clear any walking paths of anything that might make someone trip, such as rocks or tools.  Regularly check to see if handrails are loose or broken. Make sure that both sides of any steps have handrails.  Any raised decks and porches should have guardrails on the edges.  Have any leaves, snow, or ice cleared regularly.  Use sand or salt on walking paths during winter.  Clean up any spills in your garage right away. This includes oil or grease spills. What can I do in the bathroom?  Use night  lights.  Install grab bars by the toilet and in the tub and shower. Do not use towel bars as grab bars.  Use non-skid mats or decals in the tub or shower.  If you need to sit down in the shower, use a plastic, non-slip stool.  Keep the floor dry. Clean up any water that spills on the floor as soon as it happens.  Remove soap buildup in the tub or shower regularly.  Attach bath mats securely with double-sided non-slip rug tape.  Do not have throw rugs and other things on the floor that can make you trip. What can I do in the bedroom?  Use night lights.  Make sure that you have a light by your bed that is easy to reach.  Do not use any sheets or blankets that are too big for your bed. They should not hang down onto the floor.  Have a firm chair that has side arms. You can use this for support while you get dressed.  Do not have throw rugs and other things on the floor that can make you trip. What can I do in the kitchen?  Clean up any spills right away.  Avoid walking on wet floors.  Keep items  that you use a lot in easy-to-reach places.  If you need to reach something above you, use a strong step stool that has a grab bar.  Keep electrical cords out of the way.  Do not use floor polish or wax that makes floors slippery. If you must use wax, use non-skid floor wax.  Do not have throw rugs and other things on the floor that can make you trip. What can I do with my stairs?  Do not leave any items on the stairs.  Make sure that there are handrails on both sides of the stairs and use them. Fix handrails that are broken or loose. Make sure that handrails are as long as the stairways.  Check any carpeting to make sure that it is firmly attached to the stairs. Fix any carpet that is loose or worn.  Avoid having throw rugs at the top or bottom of the stairs. If you do have throw rugs, attach them to the floor with carpet tape.  Make sure that you have a light switch at the  top of the stairs and the bottom of the stairs. If you do not have them, ask someone to add them for you. What else can I do to help prevent falls?  Wear shoes that:  Do not have high heels.  Have rubber bottoms.  Are comfortable and fit you well.  Are closed at the toe. Do not wear sandals.  If you use a stepladder:  Make sure that it is fully opened. Do not climb a closed stepladder.  Make sure that both sides of the stepladder are locked into place.  Ask someone to hold it for you, if possible.  Clearly mark and make sure that you can see:  Any grab bars or handrails.  First and last steps.  Where the edge of each step is.  Use tools that help you move around (mobility aids) if they are needed. These include:  Canes.  Walkers.  Scooters.  Crutches.  Turn on the lights when you go into a dark area. Replace any light bulbs as soon as they burn out.  Set up your furniture so you have a clear path. Avoid moving your furniture around.  If any of your floors are uneven, fix them.  If there are any pets around you, be aware of where they are.  Review your medicines with your doctor. Some medicines can make you feel dizzy. This can increase your chance of falling. Ask your doctor what other things that you can do to help prevent falls. This information is not intended to replace advice given to you by your health care provider. Make sure you discuss any questions you have with your health care provider. Document Released: 07/20/2009 Document Revised: 02/29/2016 Document Reviewed: 10/28/2014 Elsevier Interactive Patient Education  2017 [employer].

## 2019-10-11 ENCOUNTER — Encounter: Payer: Self-pay | Admitting: Family Medicine

## 2019-11-06 ENCOUNTER — Encounter: Payer: Self-pay | Admitting: Family Medicine

## 2019-11-08 ENCOUNTER — Other Ambulatory Visit: Payer: Self-pay

## 2019-11-08 DIAGNOSIS — F4323 Adjustment disorder with mixed anxiety and depressed mood: Secondary | ICD-10-CM

## 2019-11-08 DIAGNOSIS — R42 Dizziness and giddiness: Secondary | ICD-10-CM

## 2019-11-08 MED ORDER — SERTRALINE HCL 25 MG PO TABS: 25.0000 mg | ORAL_TABLET | Freq: Every day | ORAL | 3 refills | Status: DC

## 2019-11-08 MED ORDER — GLYCOPYRROLATE 1 MG PO TABS: 1.0000 mg | ORAL_TABLET | Freq: Two times a day (BID) | ORAL | 0 refills | Status: DC

## 2019-11-10 ENCOUNTER — Encounter: Payer: Self-pay | Admitting: Family Medicine

## 2019-11-14 ENCOUNTER — Ambulatory Visit: Payer: PPO | Attending: Internal Medicine

## 2019-11-14 DIAGNOSIS — Z23 Encounter for immunization: Secondary | ICD-10-CM | POA: Insufficient documentation

## 2019-11-14 NOTE — Progress Notes (Signed)
   Covid-19 Vaccination Clinic  Name:  Jerita Charania    MRN: [id number] DOB: 12-03-1950  11/14/2019  Ms. Astwood was observed post Covid-19 immunization for 15 minutes without incidence. She was provided with Vaccine Information Sheet and instruction to access the V-Safe system.   Ms. Rhude was instructed to call 911 with any severe reactions post vaccine: Marland Kitchen Difficulty breathing  . Swelling of your face and throat  . A fast heartbeat  . A bad rash all over your body  . Dizziness and weakness    Immunizations Administered    Name Date Dose VIS Date Route   Pfizer COVID-19 Vaccine 11/14/2019  2:17 PM 0.3 mL 09/17/2019 Intramuscular   Manufacturer: [place]   Lot: [id number]   [place]: [id number]

## 2019-11-29 ENCOUNTER — Ambulatory Visit: Payer: PPO

## 2019-12-03 ENCOUNTER — Encounter: Payer: Self-pay | Admitting: Family Medicine

## 2019-12-03 NOTE — Telephone Encounter (Signed)
Ok to place referral to female dermatology?

## 2019-12-06 ENCOUNTER — Other Ambulatory Visit: Payer: Self-pay

## 2019-12-06 DIAGNOSIS — R42 Dizziness and giddiness: Secondary | ICD-10-CM

## 2019-12-06 MED ORDER — POTASSIUM CHLORIDE CRYS ER 10 MEQ PO TBCR: 10.0000 meq | EXTENDED_RELEASE_TABLET | Freq: Two times a day (BID) | ORAL | 1 refills | Status: DC

## 2019-12-06 MED ORDER — GLYCOPYRROLATE 1 MG PO TABS: 1.0000 mg | ORAL_TABLET | Freq: Two times a day (BID) | ORAL | 1 refills | Status: DC

## 2019-12-07 ENCOUNTER — Other Ambulatory Visit: Payer: Self-pay

## 2019-12-07 DIAGNOSIS — H04129 Dry eye syndrome of unspecified lacrimal gland: Secondary | ICD-10-CM

## 2019-12-07 DIAGNOSIS — D229 Melanocytic nevi, unspecified: Secondary | ICD-10-CM

## 2019-12-09 ENCOUNTER — Ambulatory Visit: Payer: PPO | Attending: Internal Medicine

## 2019-12-09 DIAGNOSIS — Z23 Encounter for immunization: Secondary | ICD-10-CM | POA: Insufficient documentation

## 2019-12-09 NOTE — Progress Notes (Signed)
   Covid-19 Vaccination Clinic  Name:  Monalisa Goos    MRN: [id number] DOB: [birth year]  12/09/2019  Ms. Messenger was observed post Covid-19 immunization for 15 minutes without incident. She was provided with Vaccine Information Sheet and instruction to access the V-Safe system.   Ms. Harer was instructed to call 911 with any severe reactions post vaccine: Marland Kitchen Difficulty breathing  . Swelling of face and throat  . A fast heartbeat  . A bad rash all over body  . Dizziness and weakness   Immunizations Administered    Name Date Dose VIS Date Route   Pfizer COVID-19 Vaccine 12/09/2019 10:04 AM 0.3 mL 09/17/2019 Intramuscular   Manufacturer: [place]   Lot: [id number]   [place]: [id number]

## 2019-12-24 DIAGNOSIS — M9902 Segmental and somatic dysfunction of thoracic region: Secondary | ICD-10-CM | POA: Diagnosis not present

## 2019-12-24 DIAGNOSIS — M9903 Segmental and somatic dysfunction of lumbar region: Secondary | ICD-10-CM | POA: Diagnosis not present

## 2019-12-24 DIAGNOSIS — M9901 Segmental and somatic dysfunction of cervical region: Secondary | ICD-10-CM | POA: Diagnosis not present

## 2019-12-24 DIAGNOSIS — M9905 Segmental and somatic dysfunction of pelvic region: Secondary | ICD-10-CM | POA: Diagnosis not present

## 2019-12-24 DIAGNOSIS — M9904 Segmental and somatic dysfunction of sacral region: Secondary | ICD-10-CM | POA: Diagnosis not present

## 2019-12-24 DIAGNOSIS — M65332 Trigger finger, left middle finger: Secondary | ICD-10-CM | POA: Diagnosis not present

## 2019-12-30 ENCOUNTER — Other Ambulatory Visit: Payer: Self-pay | Admitting: Family Medicine

## 2019-12-31 ENCOUNTER — Other Ambulatory Visit: Payer: Self-pay | Admitting: Family Medicine

## 2020-01-01 ENCOUNTER — Other Ambulatory Visit: Payer: Self-pay | Admitting: Family Medicine

## 2020-01-01 DIAGNOSIS — I152 Hypertension secondary to endocrine disorders: Secondary | ICD-10-CM

## 2020-01-01 DIAGNOSIS — E1159 Type 2 diabetes mellitus with other circulatory complications: Secondary | ICD-10-CM

## 2020-01-03 NOTE — Telephone Encounter (Signed)
Please review

## 2020-01-07 DIAGNOSIS — M9903 Segmental and somatic dysfunction of lumbar region: Secondary | ICD-10-CM | POA: Diagnosis not present

## 2020-01-07 DIAGNOSIS — M9901 Segmental and somatic dysfunction of cervical region: Secondary | ICD-10-CM | POA: Diagnosis not present

## 2020-01-07 DIAGNOSIS — M65332 Trigger finger, left middle finger: Secondary | ICD-10-CM | POA: Diagnosis not present

## 2020-01-07 DIAGNOSIS — M25511 Pain in right shoulder: Secondary | ICD-10-CM | POA: Diagnosis not present

## 2020-01-07 DIAGNOSIS — M9902 Segmental and somatic dysfunction of thoracic region: Secondary | ICD-10-CM | POA: Diagnosis not present

## 2020-01-07 DIAGNOSIS — M9904 Segmental and somatic dysfunction of sacral region: Secondary | ICD-10-CM | POA: Diagnosis not present

## 2020-01-07 DIAGNOSIS — M9905 Segmental and somatic dysfunction of pelvic region: Secondary | ICD-10-CM | POA: Diagnosis not present

## 2020-01-07 DIAGNOSIS — M545 Low back pain: Secondary | ICD-10-CM | POA: Diagnosis not present

## 2020-01-17 DIAGNOSIS — M65332 Trigger finger, left middle finger: Secondary | ICD-10-CM | POA: Diagnosis not present

## 2020-01-17 DIAGNOSIS — M9904 Segmental and somatic dysfunction of sacral region: Secondary | ICD-10-CM | POA: Diagnosis not present

## 2020-01-17 DIAGNOSIS — M9905 Segmental and somatic dysfunction of pelvic region: Secondary | ICD-10-CM | POA: Diagnosis not present

## 2020-01-17 DIAGNOSIS — M25551 Pain in right hip: Secondary | ICD-10-CM | POA: Diagnosis not present

## 2020-01-17 DIAGNOSIS — M9903 Segmental and somatic dysfunction of lumbar region: Secondary | ICD-10-CM | POA: Diagnosis not present

## 2020-01-17 DIAGNOSIS — M9901 Segmental and somatic dysfunction of cervical region: Secondary | ICD-10-CM | POA: Diagnosis not present

## 2020-01-17 DIAGNOSIS — M9902 Segmental and somatic dysfunction of thoracic region: Secondary | ICD-10-CM | POA: Diagnosis not present

## 2020-01-26 ENCOUNTER — Encounter: Payer: Self-pay | Admitting: Family Medicine

## 2020-01-26 ENCOUNTER — Other Ambulatory Visit: Payer: Self-pay | Admitting: Family Medicine

## 2020-01-26 DIAGNOSIS — M65332 Trigger finger, left middle finger: Secondary | ICD-10-CM | POA: Diagnosis not present

## 2020-01-26 DIAGNOSIS — M9903 Segmental and somatic dysfunction of lumbar region: Secondary | ICD-10-CM | POA: Diagnosis not present

## 2020-01-26 DIAGNOSIS — M9904 Segmental and somatic dysfunction of sacral region: Secondary | ICD-10-CM | POA: Diagnosis not present

## 2020-01-26 DIAGNOSIS — M9902 Segmental and somatic dysfunction of thoracic region: Secondary | ICD-10-CM | POA: Diagnosis not present

## 2020-01-26 DIAGNOSIS — M25551 Pain in right hip: Secondary | ICD-10-CM | POA: Diagnosis not present

## 2020-01-26 DIAGNOSIS — R42 Dizziness and giddiness: Secondary | ICD-10-CM

## 2020-01-26 DIAGNOSIS — M9901 Segmental and somatic dysfunction of cervical region: Secondary | ICD-10-CM | POA: Diagnosis not present

## 2020-01-26 DIAGNOSIS — M9905 Segmental and somatic dysfunction of pelvic region: Secondary | ICD-10-CM | POA: Diagnosis not present

## 2020-01-27 ENCOUNTER — Other Ambulatory Visit: Payer: Self-pay

## 2020-01-27 MED ORDER — GLYCOPYRROLATE 1 MG PO TABS: 1.0000 mg | ORAL_TABLET | Freq: Two times a day (BID) | ORAL | 1 refills | Status: DC

## 2020-01-27 MED ORDER — PRAVASTATIN SODIUM 20 MG PO TABS: 20.0000 mg | ORAL_TABLET | Freq: Every day | ORAL | 3 refills | Status: DC

## 2020-01-27 NOTE — Telephone Encounter (Signed)
I refilled it .... looks like the 2mg  dose is on her formulary. Can change to 2mg  bid if needed.  Thanks!

## 2020-02-07 DIAGNOSIS — M25551 Pain in right hip: Secondary | ICD-10-CM | POA: Diagnosis not present

## 2020-02-07 DIAGNOSIS — M9901 Segmental and somatic dysfunction of cervical region: Secondary | ICD-10-CM | POA: Diagnosis not present

## 2020-02-07 DIAGNOSIS — M65332 Trigger finger, left middle finger: Secondary | ICD-10-CM | POA: Diagnosis not present

## 2020-02-07 DIAGNOSIS — M9902 Segmental and somatic dysfunction of thoracic region: Secondary | ICD-10-CM | POA: Diagnosis not present

## 2020-02-07 DIAGNOSIS — M9907 Segmental and somatic dysfunction of upper extremity: Secondary | ICD-10-CM | POA: Diagnosis not present

## 2020-02-07 DIAGNOSIS — M9905 Segmental and somatic dysfunction of pelvic region: Secondary | ICD-10-CM | POA: Diagnosis not present

## 2020-02-07 DIAGNOSIS — M9904 Segmental and somatic dysfunction of sacral region: Secondary | ICD-10-CM | POA: Diagnosis not present

## 2020-02-07 DIAGNOSIS — M9903 Segmental and somatic dysfunction of lumbar region: Secondary | ICD-10-CM | POA: Diagnosis not present

## 2020-02-08 ENCOUNTER — Encounter: Payer: Self-pay | Admitting: Family Medicine

## 2020-02-08 DIAGNOSIS — I10 Essential (primary) hypertension: Secondary | ICD-10-CM

## 2020-02-08 DIAGNOSIS — I152 Hypertension secondary to endocrine disorders: Secondary | ICD-10-CM

## 2020-02-08 MED ORDER — TRIAMTERENE-HCTZ 37.5-25 MG PO CAPS: 1.0000 | ORAL_CAPSULE | Freq: Every day | ORAL | 11 refills | Status: DC

## 2020-02-16 DIAGNOSIS — L918 Other hypertrophic disorders of the skin: Secondary | ICD-10-CM | POA: Diagnosis not present

## 2020-02-16 DIAGNOSIS — D2272 Melanocytic nevi of left lower limb, including hip: Secondary | ICD-10-CM | POA: Diagnosis not present

## 2020-02-16 DIAGNOSIS — R208 Other disturbances of skin sensation: Secondary | ICD-10-CM | POA: Diagnosis not present

## 2020-02-16 DIAGNOSIS — D225 Melanocytic nevi of trunk: Secondary | ICD-10-CM | POA: Diagnosis not present

## 2020-02-16 DIAGNOSIS — D1801 Hemangioma of skin and subcutaneous tissue: Secondary | ICD-10-CM | POA: Diagnosis not present

## 2020-02-16 DIAGNOSIS — L814 Other melanin hyperpigmentation: Secondary | ICD-10-CM | POA: Diagnosis not present

## 2020-02-16 DIAGNOSIS — D2261 Melanocytic nevi of right upper limb, including shoulder: Secondary | ICD-10-CM | POA: Diagnosis not present

## 2020-02-16 DIAGNOSIS — L821 Other seborrheic keratosis: Secondary | ICD-10-CM | POA: Diagnosis not present

## 2020-02-16 DIAGNOSIS — B351 Tinea unguium: Secondary | ICD-10-CM | POA: Diagnosis not present

## 2020-02-21 DIAGNOSIS — M9903 Segmental and somatic dysfunction of lumbar region: Secondary | ICD-10-CM | POA: Diagnosis not present

## 2020-02-21 DIAGNOSIS — M9904 Segmental and somatic dysfunction of sacral region: Secondary | ICD-10-CM | POA: Diagnosis not present

## 2020-02-21 DIAGNOSIS — M9905 Segmental and somatic dysfunction of pelvic region: Secondary | ICD-10-CM | POA: Diagnosis not present

## 2020-02-21 DIAGNOSIS — M25551 Pain in right hip: Secondary | ICD-10-CM | POA: Diagnosis not present

## 2020-02-21 DIAGNOSIS — M9901 Segmental and somatic dysfunction of cervical region: Secondary | ICD-10-CM | POA: Diagnosis not present

## 2020-02-21 DIAGNOSIS — M9907 Segmental and somatic dysfunction of upper extremity: Secondary | ICD-10-CM | POA: Diagnosis not present

## 2020-02-21 DIAGNOSIS — M65332 Trigger finger, left middle finger: Secondary | ICD-10-CM | POA: Diagnosis not present

## 2020-02-21 DIAGNOSIS — M9908 Segmental and somatic dysfunction of rib cage: Secondary | ICD-10-CM | POA: Diagnosis not present

## 2020-03-07 ENCOUNTER — Ambulatory Visit: Payer: PPO | Admitting: Student

## 2020-03-07 ENCOUNTER — Encounter: Payer: Self-pay | Admitting: Student

## 2020-03-07 ENCOUNTER — Other Ambulatory Visit: Payer: Self-pay

## 2020-03-07 VITALS — BP 110/68 | HR 69 | Ht 64.0 in | Wt 192.1 lb

## 2020-03-07 DIAGNOSIS — I493 Ventricular premature depolarization: Secondary | ICD-10-CM

## 2020-03-07 DIAGNOSIS — M9905 Segmental and somatic dysfunction of pelvic region: Secondary | ICD-10-CM | POA: Diagnosis not present

## 2020-03-07 DIAGNOSIS — R55 Syncope and collapse: Secondary | ICD-10-CM | POA: Diagnosis not present

## 2020-03-07 DIAGNOSIS — M545 Low back pain: Secondary | ICD-10-CM | POA: Diagnosis not present

## 2020-03-07 DIAGNOSIS — Z79899 Other long term (current) drug therapy: Secondary | ICD-10-CM | POA: Diagnosis not present

## 2020-03-07 DIAGNOSIS — R079 Chest pain, unspecified: Secondary | ICD-10-CM | POA: Diagnosis not present

## 2020-03-07 DIAGNOSIS — M65332 Trigger finger, left middle finger: Secondary | ICD-10-CM | POA: Diagnosis not present

## 2020-03-07 DIAGNOSIS — M9903 Segmental and somatic dysfunction of lumbar region: Secondary | ICD-10-CM | POA: Diagnosis not present

## 2020-03-07 DIAGNOSIS — M25551 Pain in right hip: Secondary | ICD-10-CM | POA: Diagnosis not present

## 2020-03-07 DIAGNOSIS — M9904 Segmental and somatic dysfunction of sacral region: Secondary | ICD-10-CM | POA: Diagnosis not present

## 2020-03-07 LAB — BASIC METABOLIC PANEL
BUN/Creatinine Ratio: 21 (ref 12–28)
BUN: 13 mg/dL (ref 8–27)
CO2: 24 mmol/L (ref 20–29)
Calcium: 8.9 mg/dL (ref 8.7–10.3)
Chloride: 101 mmol/L (ref 96–106)
Creatinine, Ser: 0.61 mg/dL (ref 0.57–1.00)
GFR calc Af Amer: 108 mL/min/{1.73_m2} (ref >59)
GFR calc non Af Amer: 94 mL/min/{1.73_m2} (ref >59)
Glucose: 131 mg/dL — ABNORMAL HIGH (ref 65–99)
Potassium: 3.4 mmol/L — ABNORMAL LOW (ref 3.5–5.2)
Sodium: 141 mmol/L (ref 134–144)

## 2020-03-07 NOTE — Patient Instructions (Addendum)
Medication Instructions:  none *If you need a refill on your cardiac medications before your next appointment, please call your pharmacy*   Lab Work:  TODAY BMET If you have labs (blood work) drawn today and your tests are completely normal, you will receive your results only by:  [place] (if you have MyChart) OR  A paper copy in the mail If you have any lab test that is abnormal or we need to change your treatment, we will call you to review the results.   Testing/Procedures: none   Follow-Up: At [facility], you and your health needs are our priority.  As part of our continuing mission to provide you with exceptional heart care, we have created designated Provider Care Teams.  These Care Teams include your primary Cardiologist (physician) and Advanced Practice Providers (APPs -  Physician Assistants and Nurse Practitioners) who all work together to provide you with the care you need, when you need it.  Your next appointment:   6 MONTHS  The format for your next appointment:   Either In Person or Virtual  Provider:   Dr Caryl Comes   Other Instructions

## 2020-03-07 NOTE — Progress Notes (Signed)
PCP:  Leamon Arnt, MD Primary Cardiologist: No primary care provider on file. Electrophysiologist: Virl Axe, MD   Mary Hernandez is a 69 y.o. female seen today for Virl Axe, MD for routine electrophysiology followup for frequent PVCs on flecainide and h/o atypical chest pain.  Since last being seen in our clinic the patient reports doing very well. She has had no further atypical chest pain, and the syncopal event last year was isolated and likely in setting of GI upset.  she denies chest pain, palpitations, dyspnea, PND, orthopnea, nausea, vomiting, dizziness, syncope, edema, weight gain, or early satiety.  Past Medical History:  Diagnosis Date  . Acquired hypothyroidism 03/01/2018   hx of goiter; s/p thyroidectomy  . Anxiety   . Atrial premature depolarization   . BPPV (benign paroxysmal positional vertigo) 03/01/2018  . Colon polyps   . Essential hypertension 03/01/2018   denies hx - on diuretic for Meniere's and beta blocker for PVCs  . Frequent PVCs 03/01/2018   Controlled on Flecainide, Acebutolol  . GERD (gastroesophageal reflux disease)   . History of cardiac catheterization    Nuc 7/16:  normal perfusion; EF 76 // LHC 8/16:  normal coronary arteries  . History of depression   . History of dizziness   . History of echocardiogram    Echo 03/2006:  Normal LVEF  . HLD (hyperlipidemia)   . Hypothyroidism   . IBS (irritable bowel syndrome)   . Insulin resistance   . Meniere's disease    Past Surgical History:  Procedure Laterality Date  . BLADDER SURGERY  1997  . BREAST BIOPSY  2013  . BREAST REDUCTION SURGERY  1999  . CARDIAC CATHETERIZATION  05/2015  . Carpel Tunnel Release    . CHOLECYSTECTOMY  2011  . COLONOSCOPY  08/20/2015  . ESOPHAGOGASTRODUODENOSCOPY  01/13/2017  . [place]  . REDUCTION MAMMAPLASTY    . REFRACTIVE SURGERY    . S/P Eye Right 1967   Muscle Clipped   . THYROIDECTOMY  2012  . TONSILLECTOMY  1975  . TOTAL ABDOMINAL  HYSTERECTOMY  1996    Current Outpatient Medications  Medication Sig Dispense Refill  . acebutolol (SECTRAL) 200 MG capsule Take 1 capsule (200 mg total) by mouth daily. 90 capsule 3  . Calcium Citrate-Vitamin D 315-250 MG-UNIT TABS Take by mouth.    . cetirizine (ZYRTEC) 10 MG tablet Take 10 mg by mouth daily.    Marland Kitchen EVENING PRIMROSE OIL PO Take 1,500 mg by mouth 2 (two) times daily.    . flecainide (TAMBOCOR) 50 MG tablet Take 1.5 tablets (75 mg total) by mouth 2 (two) times daily. 270 tablet 3  . fluticasone (FLONASE) 50 MCG/ACT nasal spray Place 2 sprays into both nostrils daily. 16 g 6  . gabapentin (NEURONTIN) 100 MG capsule 700 mg in the morning and at bedtime. 100mg  am, 600pm    . Glucosamine-Chondroit-Vit C-Mn (GLUCOSAMINE 1500 COMPLEX) CAPS Take by mouth.    Marland Kitchen glycopyrrolate (ROBINUL) 1 MG tablet Take 1 tablet (1 mg total) by mouth 2 (two) times daily. 180 tablet 1  . hydroxypropyl methylcellulose / hypromellose (ISOPTO TEARS / GONIOVISC) 2.5 % ophthalmic solution Place 1 drop into both eyes 4 (four) times daily as needed for dry eyes. 15 mL 12  . levothyroxine (SYNTHROID) 112 MCG tablet Take 1 tablet (112 mcg total) by mouth daily. 90 tablet 3  . omega-3 acid ethyl esters (LOVAZA) 1 g capsule Take 1 g by mouth 2 (two) times  daily.    Marland Kitchen OZEMPIC, 1 MG/DOSE, 2 MG/1.5ML SOPN Inject 1 mg into the skin once a week.    . potassium chloride (KLOR-CON) 10 MEQ tablet Take 1 tablet (10 mEq total) by mouth 2 (two) times daily. 180 tablet 1  . pravastatin (PRAVACHOL) 20 MG tablet Take 1 tablet (20 mg total) by mouth at bedtime. 90 tablet 3  . Probiotic Product (PROBIOTIC-10 PO) Take by mouth.    . sertraline (ZOLOFT) 25 MG tablet Take 1 tablet (25 mg total) by mouth daily. 90 tablet 3  . triamterene-hydrochlorothiazide (DYAZIDE) 37.5-25 MG capsule Take 1 each (1 capsule total) by mouth daily. TAKE ONE CAPSULE BY MOUTH DAILY 30 capsule 11   No current facility-administered medications for this  visit.    Allergies  Allergen Reactions  . Adhesive [Tape] Rash    Social History   Socioeconomic History  . Marital status: Married    Spouse name: Not on file  . Number of children: 2  . Years of education: Not on file  . Highest education level: Not on file  Occupational History  . Occupation: Retired  Tobacco Use  . Smoking status: Never Smoker  . Smokeless tobacco: Never Used  Substance and Sexual Activity  . Alcohol use: Yes    Comment: very limited   . Drug use: Never  . Sexual activity: Not on file  Other Topics Concern  . Not on file  Social History Narrative   Retired Licensed conveyancer   Born [place] in [place] for [place] years   Moved to [place] in 2019   Twin daughters (one daughter is diabetic Tourist information centre manager)    Social Determinants of Radio broadcast assistant Strain:   . Difficulty of Paying Living Expenses:   Food Insecurity:   . Worried About Charity fundraiser in the Last Year:   . Arboriculturist in the Last Year:   Transportation Needs:   . Film/video editor (Medical):   Marland Kitchen Lack of Transportation (Non-Medical):   Physical Activity:   . Days of Exercise per Week:   . Minutes of Exercise per Session:   Stress:   . Feeling of Stress :   Social Connections:   . Frequency of Communication with Friends and Family:   . Frequency of Social Gatherings with Friends and Family:   . Attends Religious Services:   . Active Member of Clubs or Organizations:   . Attends Archivist Meetings:   Marland Kitchen Marital Status:   Intimate Partner Violence:   . Fear of Current or Ex-Partner:   . Emotionally Abused:   Marland Kitchen Physically Abused:   . Sexually Abused:      Review of Systems: General: No chills, fever, night sweats or weight changes  Cardiovascular:  No chest pain, dyspnea on exertion, edema, orthopnea, palpitations, paroxysmal nocturnal dyspnea Dermatological: No rash, lesions or masses Respiratory: No  cough, dyspnea Urologic: No hematuria, dysuria Abdominal: No nausea, vomiting, diarrhea, bright red blood per rectum, melena, or hematemesis Neurologic: No visual changes, weakness, changes in mental status All other systems reviewed and are otherwise negative except as noted above.  Physical Exam: Vitals:   03/07/20 0836  BP: 110/68  Pulse: 69  SpO2: 95%  Weight: 192 lb 1.9 oz (87.1 kg)  Height: 5\' 4"  (1.626 m)    GEN- The patient is well appearing, alert and oriented x 3 today.   HEENT: normocephalic,  atraumatic; sclera clear, conjunctiva pink; hearing intact; oropharynx clear; neck supple, no JVP Lymph- no cervical lymphadenopathy Lungs- Clear to ausculation bilaterally, normal work of breathing.  No wheezes, rales, rhonchi Heart- Regular rate and rhythm, no murmurs, rubs or gallops, PMI not laterally displaced GI- soft, non-tender, non-distended, bowel sounds present, no hepatosplenomegaly Extremities- no clubbing, cyanosis, or edema; DP/PT/radial pulses 2+ bilaterally MS- no significant deformity or atrophy Skin- warm and dry, no rash or lesion Psych- euthymic mood, full affect Neuro- strength and sensation are intact  EKG is ordered. Personal review of EKG from today shows 69 bpm, 1st degree AV block with PR interval 220 ms, QRS 126 ms EKG 04/2019 showed NSR 69 bpm, QRS 120 ms, PR interval 202 ms  Additional studies reviewed include: Previous EP office notes, Previous EKGs. Echo 04/2019 LVEF 60-65%  Assessment and Plan:  1. Symptomatic PVCs Well managed on Flecainide for suppression Intervals relatively stable.  BMET today on potassium. EKG stable.   2. Chest pain, atypical CA Score of 0. No further   RTC 6 months with Dr. Caryl Comes. Sooner with symptoms.   Shirley Friar, PA-C  03/07/20 8:45 AM

## 2020-03-08 ENCOUNTER — Telehealth: Payer: Self-pay

## 2020-03-08 DIAGNOSIS — Z79899 Other long term (current) drug therapy: Secondary | ICD-10-CM

## 2020-03-08 MED ORDER — POTASSIUM CHLORIDE ER 10 MEQ PO TBCR: EXTENDED_RELEASE_TABLET | ORAL | 3 refills | Status: DC

## 2020-03-08 NOTE — Telephone Encounter (Signed)
-----   Message from Shirley Friar, PA-C sent at 03/08/2020 12:56 PM EDT ----- Please have her take an extra 40 (4 tablets) of potassium today to get her back up to normal.  Would then increase her potassium to 20 meq (2 tablets) q am and 10 meq (1 tablet) q pm.   Recheck BMET 10 days.

## 2020-03-08 NOTE — Telephone Encounter (Signed)
The patient has been notified of the lab result and verbalized understanding.  All questions (if any) were answered. Frederik Schmidt, RN 03/08/2020 1:14 PM    She will increase her Potassium to 20 meq in AM and 10 meq in PM.  She will return for [facility] 03/17/20

## 2020-03-15 DIAGNOSIS — H0288B Meibomian gland dysfunction left eye, upper and lower eyelids: Secondary | ICD-10-CM | POA: Diagnosis not present

## 2020-03-15 DIAGNOSIS — H00025 Hordeolum internum left lower eyelid: Secondary | ICD-10-CM | POA: Diagnosis not present

## 2020-03-15 DIAGNOSIS — H16223 Keratoconjunctivitis sicca, not specified as Sjogren's, bilateral: Secondary | ICD-10-CM | POA: Diagnosis not present

## 2020-03-15 DIAGNOSIS — H0288A Meibomian gland dysfunction right eye, upper and lower eyelids: Secondary | ICD-10-CM | POA: Diagnosis not present

## 2020-03-17 ENCOUNTER — Other Ambulatory Visit: Payer: PPO | Admitting: *Deleted

## 2020-03-17 ENCOUNTER — Other Ambulatory Visit: Payer: Self-pay

## 2020-03-17 DIAGNOSIS — Z79899 Other long term (current) drug therapy: Secondary | ICD-10-CM | POA: Diagnosis not present

## 2020-03-17 LAB — BASIC METABOLIC PANEL
BUN/Creatinine Ratio: 22 (ref 12–28)
BUN: 13 mg/dL (ref 8–27)
CO2: 24 mmol/L (ref 20–29)
Calcium: 8.8 mg/dL (ref 8.7–10.3)
Chloride: 97 mmol/L (ref 96–106)
Creatinine, Ser: 0.6 mg/dL (ref 0.57–1.00)
GFR calc Af Amer: 109 mL/min/{1.73_m2} (ref >59)
GFR calc non Af Amer: 95 mL/min/{1.73_m2} (ref >59)
Glucose: 159 mg/dL — ABNORMAL HIGH (ref 65–99)
Potassium: 3.5 mmol/L (ref 3.5–5.2)
Sodium: 140 mmol/L (ref 134–144)

## 2020-03-20 ENCOUNTER — Telehealth: Payer: Self-pay

## 2020-03-20 DIAGNOSIS — E876 Hypokalemia: Secondary | ICD-10-CM

## 2020-03-20 DIAGNOSIS — Z79899 Other long term (current) drug therapy: Secondary | ICD-10-CM

## 2020-03-20 MED ORDER — POTASSIUM CHLORIDE ER 20 MEQ PO TBCR: 20.0000 meq | EXTENDED_RELEASE_TABLET | Freq: Two times a day (BID) | ORAL | 3 refills | Status: DC

## 2020-03-20 NOTE — Telephone Encounter (Signed)
The patient has been notified of the lab result and verbalized understanding.  All questions (if any) were answered. Frederik Schmidt, RN 03/20/2020 8:16 AM

## 2020-03-20 NOTE — Telephone Encounter (Signed)
-----   Message from Shirley Friar, PA-C sent at 03/20/2020  8:07 AM EDT ----- Potassium improved but remains borderline.  Lets go ahead and increase her potassium to 20 meq BID and just leave her there for now.  Would repeat BMET in 2 weeks just to make sure not trending up too high (though unlikely)  Thank you!  Legrand Como [address] [place], PA-C  03/20/2020 8:06 AM

## 2020-03-21 ENCOUNTER — Other Ambulatory Visit: Payer: Self-pay | Admitting: Sports Medicine

## 2020-03-21 DIAGNOSIS — M545 Low back pain: Secondary | ICD-10-CM | POA: Diagnosis not present

## 2020-03-21 DIAGNOSIS — M9902 Segmental and somatic dysfunction of thoracic region: Secondary | ICD-10-CM | POA: Diagnosis not present

## 2020-03-21 DIAGNOSIS — M9901 Segmental and somatic dysfunction of cervical region: Secondary | ICD-10-CM | POA: Diagnosis not present

## 2020-03-21 DIAGNOSIS — M9903 Segmental and somatic dysfunction of lumbar region: Secondary | ICD-10-CM | POA: Diagnosis not present

## 2020-03-21 DIAGNOSIS — E876 Hypokalemia: Secondary | ICD-10-CM | POA: Diagnosis not present

## 2020-03-21 DIAGNOSIS — M9905 Segmental and somatic dysfunction of pelvic region: Secondary | ICD-10-CM | POA: Diagnosis not present

## 2020-03-21 DIAGNOSIS — M65332 Trigger finger, left middle finger: Secondary | ICD-10-CM | POA: Diagnosis not present

## 2020-03-21 DIAGNOSIS — M9904 Segmental and somatic dysfunction of sacral region: Secondary | ICD-10-CM | POA: Diagnosis not present

## 2020-03-21 DIAGNOSIS — G44209 Tension-type headache, unspecified, not intractable: Secondary | ICD-10-CM | POA: Diagnosis not present

## 2020-03-21 DIAGNOSIS — H8109 Meniere's disease, unspecified ear: Secondary | ICD-10-CM | POA: Diagnosis not present

## 2020-03-28 DIAGNOSIS — M9904 Segmental and somatic dysfunction of sacral region: Secondary | ICD-10-CM | POA: Diagnosis not present

## 2020-03-28 DIAGNOSIS — M9902 Segmental and somatic dysfunction of thoracic region: Secondary | ICD-10-CM | POA: Diagnosis not present

## 2020-03-28 DIAGNOSIS — M9901 Segmental and somatic dysfunction of cervical region: Secondary | ICD-10-CM | POA: Diagnosis not present

## 2020-03-28 DIAGNOSIS — M545 Low back pain: Secondary | ICD-10-CM | POA: Diagnosis not present

## 2020-03-28 DIAGNOSIS — M9903 Segmental and somatic dysfunction of lumbar region: Secondary | ICD-10-CM | POA: Diagnosis not present

## 2020-03-28 DIAGNOSIS — M9905 Segmental and somatic dysfunction of pelvic region: Secondary | ICD-10-CM | POA: Diagnosis not present

## 2020-03-28 DIAGNOSIS — G44209 Tension-type headache, unspecified, not intractable: Secondary | ICD-10-CM | POA: Diagnosis not present

## 2020-04-03 ENCOUNTER — Other Ambulatory Visit: Payer: Self-pay

## 2020-04-03 ENCOUNTER — Other Ambulatory Visit: Payer: PPO | Admitting: *Deleted

## 2020-04-03 DIAGNOSIS — Z79899 Other long term (current) drug therapy: Secondary | ICD-10-CM | POA: Diagnosis not present

## 2020-04-03 DIAGNOSIS — E876 Hypokalemia: Secondary | ICD-10-CM | POA: Diagnosis not present

## 2020-04-03 LAB — BASIC METABOLIC PANEL
BUN/Creatinine Ratio: 23 (ref 12–28)
BUN: 14 mg/dL (ref 8–27)
CO2: 24 mmol/L (ref 20–29)
Calcium: 8.7 mg/dL (ref 8.7–10.3)
Chloride: 101 mmol/L (ref 96–106)
Creatinine, Ser: 0.62 mg/dL (ref 0.57–1.00)
GFR calc Af Amer: 108 mL/min/{1.73_m2} (ref >59)
GFR calc non Af Amer: 94 mL/min/{1.73_m2} (ref >59)
Glucose: 160 mg/dL — ABNORMAL HIGH (ref 65–99)
Potassium: 3.5 mmol/L (ref 3.5–5.2)
Sodium: 139 mmol/L (ref 134–144)

## 2020-04-03 LAB — MAGNESIUM: Magnesium: 2 mg/dL (ref 1.6–2.3)

## 2020-04-04 ENCOUNTER — Telehealth: Payer: Self-pay

## 2020-04-04 DIAGNOSIS — M9901 Segmental and somatic dysfunction of cervical region: Secondary | ICD-10-CM | POA: Diagnosis not present

## 2020-04-04 DIAGNOSIS — M9904 Segmental and somatic dysfunction of sacral region: Secondary | ICD-10-CM | POA: Diagnosis not present

## 2020-04-04 DIAGNOSIS — I493 Ventricular premature depolarization: Secondary | ICD-10-CM

## 2020-04-04 DIAGNOSIS — H8109 Meniere's disease, unspecified ear: Secondary | ICD-10-CM | POA: Diagnosis not present

## 2020-04-04 DIAGNOSIS — M545 Low back pain: Secondary | ICD-10-CM | POA: Diagnosis not present

## 2020-04-04 DIAGNOSIS — M9903 Segmental and somatic dysfunction of lumbar region: Secondary | ICD-10-CM | POA: Diagnosis not present

## 2020-04-04 DIAGNOSIS — R5383 Other fatigue: Secondary | ICD-10-CM | POA: Diagnosis not present

## 2020-04-04 DIAGNOSIS — M9902 Segmental and somatic dysfunction of thoracic region: Secondary | ICD-10-CM | POA: Diagnosis not present

## 2020-04-04 DIAGNOSIS — M9905 Segmental and somatic dysfunction of pelvic region: Secondary | ICD-10-CM | POA: Diagnosis not present

## 2020-04-04 DIAGNOSIS — E876 Hypokalemia: Secondary | ICD-10-CM

## 2020-04-04 DIAGNOSIS — Z79899 Other long term (current) drug therapy: Secondary | ICD-10-CM

## 2020-04-04 DIAGNOSIS — F430 Acute stress reaction: Secondary | ICD-10-CM | POA: Diagnosis not present

## 2020-04-04 MED ORDER — POTASSIUM CHLORIDE ER 20 MEQ PO TBCR: EXTENDED_RELEASE_TABLET | ORAL | 6 refills | Status: DC

## 2020-04-04 NOTE — Telephone Encounter (Signed)
Pt is aware and agreeable to increasing potassium to 40 meq in the AM and 20 meq in the PM. She is agreeable to repeating BMET in 2 weeks

## 2020-04-04 NOTE — Telephone Encounter (Signed)
LMTCB for lab results and recommendations

## 2020-04-04 NOTE — Telephone Encounter (Signed)
Patient returning call.

## 2020-04-04 NOTE — Telephone Encounter (Signed)
-----   Message from Shirley Friar, PA-C sent at 04/04/2020  8:36 AM EDT -----  Would have her increase potassium to 40 meq q am and 20 meq q pm and repeat BMET in 2 weeks.    Thank you! Legrand Como [address] [place], PA-C  04/04/2020 8:35 AM

## 2020-04-05 NOTE — Telephone Encounter (Signed)
Pt is aware and agreeable to increasing potassium to 40 meq in the AM and 20 meq in the PM. She is agreeable to repeating BMET in 2 weeks. Per patient request sent in RX for 20 meq tablets to [facility] as she currently has RX for 10 meq and states she would be running out soon. Repeat BMET appt made for 04/21/20 and orders are in.

## 2020-04-05 NOTE — Addendum Note (Signed)
Addended by: Campbell Riches on: 04/05/2020 09:10 AM   Modules accepted: Level of Service, SmartSet

## 2020-04-05 NOTE — Telephone Encounter (Signed)
This encounter was created in error - please disregard.

## 2020-04-21 ENCOUNTER — Other Ambulatory Visit: Payer: Self-pay

## 2020-04-21 ENCOUNTER — Other Ambulatory Visit: Payer: PPO | Admitting: *Deleted

## 2020-04-21 DIAGNOSIS — Z79899 Other long term (current) drug therapy: Secondary | ICD-10-CM

## 2020-04-21 DIAGNOSIS — E876 Hypokalemia: Secondary | ICD-10-CM

## 2020-04-21 DIAGNOSIS — I493 Ventricular premature depolarization: Secondary | ICD-10-CM

## 2020-04-21 LAB — BASIC METABOLIC PANEL
BUN/Creatinine Ratio: 16 (ref 12–28)
BUN: 12 mg/dL (ref 8–27)
CO2: 23 mmol/L (ref 20–29)
Calcium: 9.1 mg/dL (ref 8.7–10.3)
Chloride: 100 mmol/L (ref 96–106)
Creatinine, Ser: 0.75 mg/dL (ref 0.57–1.00)
GFR calc Af Amer: 95 mL/min/{1.73_m2} (ref >59)
GFR calc non Af Amer: 83 mL/min/{1.73_m2} (ref >59)
Glucose: 95 mg/dL (ref 65–99)
Potassium: 3.7 mmol/L (ref 3.5–5.2)
Sodium: 139 mmol/L (ref 134–144)

## 2020-04-28 DIAGNOSIS — M9905 Segmental and somatic dysfunction of pelvic region: Secondary | ICD-10-CM | POA: Diagnosis not present

## 2020-04-28 DIAGNOSIS — M7632 Iliotibial band syndrome, left leg: Secondary | ICD-10-CM | POA: Diagnosis not present

## 2020-04-28 DIAGNOSIS — M25562 Pain in left knee: Secondary | ICD-10-CM | POA: Diagnosis not present

## 2020-04-28 DIAGNOSIS — M545 Low back pain: Secondary | ICD-10-CM | POA: Diagnosis not present

## 2020-04-28 DIAGNOSIS — M9903 Segmental and somatic dysfunction of lumbar region: Secondary | ICD-10-CM | POA: Diagnosis not present

## 2020-04-28 DIAGNOSIS — M9904 Segmental and somatic dysfunction of sacral region: Secondary | ICD-10-CM | POA: Diagnosis not present

## 2020-05-03 ENCOUNTER — Encounter: Payer: Self-pay | Admitting: Family Medicine

## 2020-05-03 ENCOUNTER — Other Ambulatory Visit: Payer: Self-pay

## 2020-05-03 MED ORDER — LEVOTHYROXINE SODIUM 112 MCG PO TABS: 112.0000 ug | ORAL_TABLET | Freq: Every day | ORAL | 0 refills | Status: DC

## 2020-05-03 NOTE — Telephone Encounter (Signed)
Medication has been sent to the patient's pharmacy. Patient notified via mychart

## 2020-05-08 MED ORDER — FLECAINIDE ACETATE 50 MG PO TABS: 75.0000 mg | ORAL_TABLET | Freq: Two times a day (BID) | ORAL | 3 refills | Status: DC

## 2020-05-14 ENCOUNTER — Other Ambulatory Visit: Payer: Self-pay | Admitting: Family Medicine

## 2020-05-17 ENCOUNTER — Other Ambulatory Visit: Payer: Self-pay | Admitting: Family Medicine

## 2020-05-17 DIAGNOSIS — Z1231 Encounter for screening mammogram for malignant neoplasm of breast: Secondary | ICD-10-CM

## 2020-05-18 DIAGNOSIS — M9907 Segmental and somatic dysfunction of upper extremity: Secondary | ICD-10-CM | POA: Diagnosis not present

## 2020-05-18 DIAGNOSIS — M9901 Segmental and somatic dysfunction of cervical region: Secondary | ICD-10-CM | POA: Diagnosis not present

## 2020-05-18 DIAGNOSIS — M9903 Segmental and somatic dysfunction of lumbar region: Secondary | ICD-10-CM | POA: Diagnosis not present

## 2020-05-18 DIAGNOSIS — M25551 Pain in right hip: Secondary | ICD-10-CM | POA: Diagnosis not present

## 2020-05-18 DIAGNOSIS — M9902 Segmental and somatic dysfunction of thoracic region: Secondary | ICD-10-CM | POA: Diagnosis not present

## 2020-05-18 DIAGNOSIS — M9904 Segmental and somatic dysfunction of sacral region: Secondary | ICD-10-CM | POA: Diagnosis not present

## 2020-05-18 DIAGNOSIS — M545 Low back pain: Secondary | ICD-10-CM | POA: Diagnosis not present

## 2020-05-18 DIAGNOSIS — M9905 Segmental and somatic dysfunction of pelvic region: Secondary | ICD-10-CM | POA: Diagnosis not present

## 2020-05-18 DIAGNOSIS — M25511 Pain in right shoulder: Secondary | ICD-10-CM | POA: Diagnosis not present

## 2020-06-01 ENCOUNTER — Other Ambulatory Visit: Payer: Self-pay | Admitting: Internal Medicine

## 2020-06-05 ENCOUNTER — Other Ambulatory Visit: Payer: Self-pay

## 2020-06-05 ENCOUNTER — Encounter: Payer: Self-pay | Admitting: Family Medicine

## 2020-06-05 ENCOUNTER — Ambulatory Visit
Admission: RE | Admit: 2020-06-05 | Discharge: 2020-06-05 | Disposition: A | Discharge status: Home / Self Care | Payer: PPO | Source: Ambulatory Visit | Attending: Family Medicine | Admitting: Family Medicine

## 2020-06-05 DIAGNOSIS — Z1231 Encounter for screening mammogram for malignant neoplasm of breast: Secondary | ICD-10-CM

## 2020-06-06 ENCOUNTER — Other Ambulatory Visit: Payer: Self-pay | Admitting: Student

## 2020-06-06 ENCOUNTER — Ambulatory Visit (INDEPENDENT_AMBULATORY_CARE_PROVIDER_SITE_OTHER): Payer: PPO | Admitting: Family Medicine

## 2020-06-06 ENCOUNTER — Encounter: Payer: Self-pay | Admitting: Family Medicine

## 2020-06-06 VITALS — BP 102/64 | HR 73 | Temp 98.1°F | Ht 64.0 in | Wt 192.2 lb

## 2020-06-06 DIAGNOSIS — N764 Abscess of vulva: Secondary | ICD-10-CM | POA: Diagnosis not present

## 2020-06-06 DIAGNOSIS — E876 Hypokalemia: Secondary | ICD-10-CM

## 2020-06-06 MED ORDER — POTASSIUM CHLORIDE ER 20 MEQ PO TBCR: EXTENDED_RELEASE_TABLET | ORAL | 3 refills | Status: DC

## 2020-06-06 NOTE — Progress Notes (Signed)
Subjective  CC:  Chief Complaint  Patient presents with  . Cyst    right labia, noticed Thursday, has tried warm compresses and Mupirocin ointment which has helped    HPI: Mary Hernandez is a 69 y.o. female who presents to the office today to address the problems listed above in the chief complaint.  Please see her recent MyChart message.  As above, small painful boil in right labia for several days.  Over-the-counter medications were not very helpful but 2 days ago started Bactroban and now symptoms are resolving.  No pustular drainage.  No fevers or chills.  Has had these problems in the past with her GYN and was treated with Bactroban  Assessment  1. Furuncle of labia majora      Plan   Furuncle/small boil: Resolving.  Continue antibiotic cream for 5 to 7 days.  Advil if needed.  No further evaluation needed at this time.  Follow up: For complete physical 06/21/2020  No orders of the defined types were placed in this encounter.  No orders of the defined types were placed in this encounter.     I reviewed the patients updated PMH, FH, and SocHx.    Patient Active Problem List   Diagnosis Date Noted  . Obesity, Class I, BMI 30-34.9 10/07/2019    Priority: High  . Insomnia due to medical condition 10/18/2018    Priority: High  . Prediabetes 10/12/2018    Priority: High  . Mixed hyperlipidemia 03/01/2018    Priority: High  . Acquired hypothyroidism 03/01/2018    Priority: High  . Frequent PVCs 03/01/2018    Priority: High  . Stress reaction 10/07/2019    Priority: Medium  . GERD (gastroesophageal reflux disease) 03/30/2018    Priority: Medium  . Meniere disease 03/01/2018    Priority: Medium  . Notalgia paresthetica 07/01/2019    Priority: Low  . Diverticulosis 03/30/2018    Priority: Low  . History of rotator cuff syndrome with tear and adhesive capsulitis 09/11/2018  . History of lateral epicondylitis of right elbow 06/25/2018   Current Meds  Medication Sig   . acebutolol (SECTRAL) 200 MG capsule TAKE ONE CAPSULE BY MOUTH DAILY  . Calcium Citrate-Vitamin D 315-250 MG-UNIT TABS Take by mouth.  . cetirizine (ZYRTEC) 10 MG tablet Take 10 mg by mouth daily.  Marland Kitchen EVENING PRIMROSE OIL PO Take 1,500 mg by mouth 2 (two) times daily.  . flecainide (TAMBOCOR) 50 MG tablet Take 1.5 tablets (75 mg total) by mouth 2 (two) times daily.  . fluticasone (FLONASE) 50 MCG/ACT nasal spray Place 2 sprays into both nostrils daily.  Marland Kitchen gabapentin (NEURONTIN) 100 MG capsule 700 mg in the morning and at bedtime. 300 mg am, 800 pm  . Glucosamine-Chondroit-Vit C-Mn (GLUCOSAMINE 1500 COMPLEX) CAPS Take by mouth.  Marland Kitchen glycopyrrolate (ROBINUL) 1 MG tablet Take 1 tablet (1 mg total) by mouth 2 (two) times daily.  . hydrochlorothiazide (MICROZIDE) 12.5 MG capsule Take 12.5 mg by mouth daily.  Marland Kitchen levothyroxine (SYNTHROID) 112 MCG tablet Take 1 tablet (112 mcg total) by mouth daily.  . Melatonin 5 MG CAPS Take 5 mg by mouth at bedtime.  Marland Kitchen omega-3 acid ethyl esters (LOVAZA) 1 g capsule Take 1 g by mouth 2 (two) times daily.  Marland Kitchen OZEMPIC, 1 MG/DOSE, 2 MG/1.5ML SOPN Inject 1 mg into the skin once a week.  . potassium chloride 20 MEQ TBCR Take 2 tablets (40 meq) in the AM and take 1 tablet (20 meq) in the  PM as by mouth as directed  . pravastatin (PRAVACHOL) 20 MG tablet Take 1 tablet (20 mg total) by mouth at bedtime.  . Probiotic Product (PROBIOTIC-10 PO) Take by mouth.  . sertraline (ZOLOFT) 25 MG tablet Take 1 tablet (25 mg total) by mouth daily.    Allergies: Patient is allergic to adhesive [tape]. Family History: Patient family history includes Breast cancer in her mother; Depression in her brother; Diabetes in her daughter; Early death in her sister; Hearing loss in her brother; Heart attack in her father; Heart disease in her father and paternal grandmother; Peripheral Artery Disease in her father; Pulmonary fibrosis in her father. Social History:  Patient  reports that she has  never smoked. She has never used smokeless tobacco. She reports current alcohol use. She reports that she does not use drugs.  Review of Systems: Constitutional: Negative for fever malaise or anorexia Cardiovascular: negative for chest pain Respiratory: negative for SOB or persistent cough Gastrointestinal: negative for abdominal pain  Objective  Vitals: BP 102/64   Pulse 73   Temp 98.1 F (36.7 C) (Temporal)   Ht 5\' 4"  (1.626 m)   Wt 192 lb 3.2 oz (87.2 kg)   SpO2 96%   BMI 32.99 kg/m  General: no acute distress , A&Ox3 Gyn; right posterior fourchette with small healing furuncle with mild ttp, no fluctuance, no drainage, minimal redness. No vesicle or vaginal discharge    Commons side effects, risks, benefits, and alternatives for medications and treatment plan prescribed today were discussed, and the patient expressed understanding of the given instructions. Patient is instructed to call or message via MyChart if he/she has any questions or concerns regarding our treatment plan. No barriers to understanding were identified. We discussed Red Flag symptoms and signs in detail. Patient expressed understanding regarding what to do in case of urgent or emergency type symptoms.   Medication list was reconciled, printed and provided to the patient in AVS. Patient instructions and summary information was reviewed with the patient as documented in the AVS. This note was prepared with assistance of Dragon voice recognition software. Occasional wrong-word or sound-a-like substitutions may have occurred due to the inherent limitations of voice recognition software  This visit occurred during the SARS-CoV-2 public health emergency.  Safety protocols were in place, including screening questions prior to the visit, additional usage of staff PPE, and extensive cleaning of exam room while observing appropriate contact time as indicated for disinfecting solutions.

## 2020-06-07 ENCOUNTER — Ambulatory Visit: Payer: PPO | Admitting: Family Medicine

## 2020-06-15 ENCOUNTER — Encounter: Payer: Self-pay | Admitting: Family Medicine

## 2020-06-19 DIAGNOSIS — M9908 Segmental and somatic dysfunction of rib cage: Secondary | ICD-10-CM | POA: Diagnosis not present

## 2020-06-19 DIAGNOSIS — M9904 Segmental and somatic dysfunction of sacral region: Secondary | ICD-10-CM | POA: Diagnosis not present

## 2020-06-19 DIAGNOSIS — G44209 Tension-type headache, unspecified, not intractable: Secondary | ICD-10-CM | POA: Diagnosis not present

## 2020-06-19 DIAGNOSIS — M9903 Segmental and somatic dysfunction of lumbar region: Secondary | ICD-10-CM | POA: Diagnosis not present

## 2020-06-19 DIAGNOSIS — M9902 Segmental and somatic dysfunction of thoracic region: Secondary | ICD-10-CM | POA: Diagnosis not present

## 2020-06-19 DIAGNOSIS — M545 Low back pain: Secondary | ICD-10-CM | POA: Diagnosis not present

## 2020-06-19 DIAGNOSIS — M9905 Segmental and somatic dysfunction of pelvic region: Secondary | ICD-10-CM | POA: Diagnosis not present

## 2020-06-19 DIAGNOSIS — M9901 Segmental and somatic dysfunction of cervical region: Secondary | ICD-10-CM | POA: Diagnosis not present

## 2020-06-21 ENCOUNTER — Encounter: Payer: Self-pay | Admitting: Family Medicine

## 2020-06-21 ENCOUNTER — Other Ambulatory Visit: Payer: Self-pay

## 2020-06-21 ENCOUNTER — Ambulatory Visit (INDEPENDENT_AMBULATORY_CARE_PROVIDER_SITE_OTHER): Payer: PPO | Admitting: Family Medicine

## 2020-06-21 VITALS — BP 108/68 | HR 78 | Temp 98.5°F | Ht 65.0 in | Wt 194.2 lb

## 2020-06-21 DIAGNOSIS — F4323 Adjustment disorder with mixed anxiety and depressed mood: Secondary | ICD-10-CM

## 2020-06-21 DIAGNOSIS — E669 Obesity, unspecified: Secondary | ICD-10-CM | POA: Diagnosis not present

## 2020-06-21 DIAGNOSIS — H8109 Meniere's disease, unspecified ear: Secondary | ICD-10-CM | POA: Diagnosis not present

## 2020-06-21 DIAGNOSIS — Z0000 Encounter for general adult medical examination without abnormal findings: Secondary | ICD-10-CM

## 2020-06-21 DIAGNOSIS — E782 Mixed hyperlipidemia: Secondary | ICD-10-CM | POA: Diagnosis not present

## 2020-06-21 DIAGNOSIS — R7303 Prediabetes: Secondary | ICD-10-CM | POA: Diagnosis not present

## 2020-06-21 DIAGNOSIS — E66811 Obesity, class 1: Secondary | ICD-10-CM

## 2020-06-21 DIAGNOSIS — Z23 Encounter for immunization: Secondary | ICD-10-CM

## 2020-06-21 DIAGNOSIS — E039 Hypothyroidism, unspecified: Secondary | ICD-10-CM

## 2020-06-21 MED ORDER — SERTRALINE HCL 50 MG PO TABS: 50.0000 mg | ORAL_TABLET | Freq: Every day | ORAL | 3 refills | Status: DC

## 2020-06-21 NOTE — Patient Instructions (Signed)
Please return in 6 months for recheck.  I will release your lab results to you on your MyChart account with further instructions. Please reply with any questions.   We will call you with information regarding your referral appointment. [name], MD for help with your weight management.  If you do not hear from [place] within the next 2 weeks, please let me know. It can take 1-2 weeks to get appointments set up with the specialists.   Today you were given your flu vaccination.   If you have any questions or concerns, please don't hesitate to send me a message via MyChart or call the office at [phone]. Thank you for visiting with [place] today! It's our pleasure caring for you.

## 2020-06-21 NOTE — Progress Notes (Signed)
Subjective  Chief Complaint  Patient presents with  . Annual Exam    Discuss pneumonia vaccine  . right arm swelling    HPI: Mary Hernandez is a 69 y.o. female who presents to [facility] today for a Female Wellness Visit. She also has the concerns and/or needs as listed above in the chief complaint. These will be addressed in addition to the Health Maintenance Visit.   Wellness Visit: annual visit with health maintenance review and exam without Pap   HM: All health screenings are up-to-date.  I reviewed the results.  Due for flu shot today.  Other immunizations are up-to-date.  Pharmacy recommended a pneumonia shot, she has questions: She did have an early Prevnar and then a follow-up Pneumovax.  Not eligible for another pneumonia shot at this time. Chronic disease f/u and/or acute problem visit: (deemed necessary to be done in addition to the wellness visit):  Hypothyroidism: Energy level feels well.  No symptoms of high or low thyroid.  Compliant with medications.  Last checked 6 months ago and was at goal.  Next hyperlipidemia on statin: Tolerating well.  Obesity: Continues on Ozempic but is not achieving weight loss.  She is struggling with following healthy low-fat diet.  She requests other options of care.  History of prediabetes: Has been well controlled on Ozempic.  Mnire's disease doing well on low-dose HCTZ.  Of note, she is treated with high dose potassium supplements for unclear reasons.  This was started by her cardiologist.  Situational anxiety on low-dose Zoloft: Her daughter struggling with multiple personality issues and patient stresses and worries about her.  She is currently seeing a therapist which is very helpful.  She started low-dose Zoloft over a year ago and felt that it was helpful although is not completely controlling her symptoms currently.  She denies panic symptoms.  No major depressive symptoms.  Assessment  1. Annual  physical exam   2. Acquired hypothyroidism   3. Mixed hyperlipidemia   4. Obesity, Class I, BMI 30-34.9   5. Prediabetes   6. Meniere's disease, unspecified laterality   7. Situational mixed anxiety and depressive disorder   8. Need for influenza vaccination      Plan  Female Wellness Visit:  Age appropriate Health Maintenance and Prevention measures were discussed with patient. Included topics are cancer screening recommendations, ways to keep healthy (see AVS) including dietary and exercise recommendations, regular eye and dental care, use of seat belts, and avoidance of moderate alcohol use and tobacco use.  Screens are up-to-date  BMI: discussed patient's BMI and encouraged positive lifestyle modifications to help get to or maintain a target BMI.  HM needs and immunizations were addressed and ordered. See below for orders. See HM and immunization section for updates.  Flu shot today  Routine labs and screening tests ordered including cmp, cbc and lipids where appropriate.  Discussed recommendations regarding Vit D and calcium supplementation (see AVS)  Chronic disease management visit and/or acute problem visit:  Hypothyroidism due for recheck.  If stable can go to annual testing.  Continue current medications.  Hyperlipidemia on statin.  Recheck today.  If stable can go to annual testing.  Obesity: We will refer back to Dr. Juleen China [facility].  Continue Ozempic in the meantime.  Check A1c.  Mnire's disease is controlled.  Counseling done for situational mixed anxiety disorder: Recommend increasing sertraline to 50 mg daily.  Recheck in 3 months if  needed.  Discussed follow-up care if worsening or having panic symptoms.  Hypokalemia on chronic supplementation: Patient to address with cardiology.  She may need work-up for hypokalemia.  Recheck levels today and continue current supplementation.  Follow up: 6 months for complete physical Orders  Placed This Encounter  Procedures  . Flu Vaccine QUAD High Dose(Fluad)  . CBC with Differential/Platelet  . COMPLETE METABOLIC PANEL WITH GFR  . Lipid panel  . TSH  . Hemoglobin A1c  . Amb Ref to Medical Weight Management   Meds ordered this encounter  Medications  . sertraline (ZOLOFT) 50 MG tablet    Sig: Take 1 tablet (50 mg total) by mouth daily.    Dispense:  90 tablet    Refill:  3      Lifestyle: Body mass index is 32.32 kg/m. Wt Readings from Last 3 Encounters:  06/21/20 194 lb 4 oz (88.1 kg)  06/06/20 192 lb 3.2 oz (87.2 kg)  03/07/20 192 lb 1.9 oz (87.1 kg)     Patient Active Problem List   Diagnosis Date Noted  . Obesity, Class I, BMI 30-34.9 10/07/2019    Priority: High  . Insomnia due to medical condition 10/18/2018    Priority: High  . Prediabetes 10/12/2018    Priority: High  . Mixed hyperlipidemia 03/01/2018    Priority: High  . Acquired hypothyroidism 03/01/2018    Priority: High  . Frequent PVCs 03/01/2018    Priority: High  . Stress reaction 10/07/2019    Priority: Medium    2020 responsive to sertraline   . GERD (gastroesophageal reflux disease) 03/30/2018    Priority: Medium  . Meniere disease 03/01/2018    Priority: Medium  . Notalgia paresthetica 07/01/2019    Priority: Low  . Diverticulosis 03/30/2018    Priority: Low  . History of rotator cuff syndrome with tear and adhesive capsulitis 09/11/2018    MRI R shoulder 08/22/18: IMPRESSION: 1. Calcific tendinosis and low-grade partial-thickness bursal surface tear of the distal anterior supraspinatus tendon at the footprint. 2. Moderate acromioclavicular osteoarthritis.  10/05/18 - PRP inj   . History of lateral epicondylitis of right elbow 06/25/2018    MRI R elbow 08/22/18: IMPRESSION: 1. Partial tear of the common forearm extensor tendon origin.  09/25/18 - PRP inj     Health Maintenance  Topic Date Due  . MAMMOGRAM  06/05/2021  . PNA vac Low Risk Adult (2 of 2 -  PPSV23) 06/10/2022  . DEXA SCAN  05/29/2023  . COLONOSCOPY  08/11/2023  . TETANUS/TDAP  06/19/2027  . INFLUENZA VACCINE  Completed  . COVID-19 Vaccine  Completed  . Hepatitis C Screening  Completed   Immunization History  Administered Date(s) Administered  . Fluad Quad(high Dose 65+) 06/08/2019, 06/21/2020  . Hepatitis A 06/18/2004  . Hepatitis B 06/18/2004  . Influenza, High Dose Seasonal PF 06/16/2018  . PFIZER SARS-COV-2 Vaccination 11/14/2019, 12/09/2019  . Pneumococcal Conjugate-13 09/14/2015  . Pneumococcal Polysaccharide-23 06/10/2017  . Tdap 06/18/2017  . Zoster 08/22/2010  . Zoster Recombinat (Shingrix) 06/18/2017, 11/06/2017   We updated and reviewed the patient's past history in detail and it is documented below. Allergies: Patient is allergic to adhesive [tape]. Past Medical History Patient  has a past medical history of Acquired hypothyroidism (03/01/2018), Anxiety, Atrial premature depolarization, BPPV (benign paroxysmal positional vertigo) (03/01/2018), Colon polyps, Essential hypertension (03/01/2018), Frequent PVCs (03/01/2018), GERD (gastroesophageal reflux disease), History of cardiac catheterization, History of depression, History of dizziness, History of echocardiogram, HLD (hyperlipidemia), Hypothyroidism, IBS (irritable bowel  syndrome), Insulin resistance, and Meniere's disease. Past Surgical History Patient  has a past surgical history that includes S/P Eye (Right, 1967); Tonsillectomy (1975); Foot neuroma surgery (1986); Total abdominal hysterectomy (1996); Breast reduction surgery (1999); Cholecystectomy (2011); Thyroidectomy (2012); Breast biopsy (2013); Carpel Tunnel Release; Refractive surgery; Cardiac catheterization (05/2015); Reduction mammaplasty; Bladder surgery (1997); Colonoscopy (08/20/2015); and Esophagogastroduodenoscopy (01/13/2017). Family History: Patient family history includes Breast cancer in her mother; Depression in her brother; Diabetes in  her daughter; Early death in her sister; Hearing loss in her brother; Heart attack in her father; Heart disease in her father and paternal grandmother; Peripheral Artery Disease in her father; Pulmonary fibrosis in her father. Social History:  Patient  reports that she has never smoked. She has never used smokeless tobacco. She reports current alcohol use. She reports that she does not use drugs.  Review of Systems: Constitutional: negative for fever or malaise Ophthalmic: negative for photophobia, double vision or loss of vision Cardiovascular: negative for chest pain, dyspnea on exertion, or new LE swelling Respiratory: negative for SOB or persistent cough Gastrointestinal: negative for abdominal pain, change in bowel habits or melena Genitourinary: negative for dysuria or gross hematuria, no abnormal uterine bleeding or disharge Musculoskeletal: negative for new gait disturbance or muscular weakness Integumentary: negative for new or persistent rashes, no breast lumps Neurological: negative for TIA or stroke symptoms Psychiatric: negative for SI or delusions Allergic/Immunologic: negative for hives  Patient Care Team    Relationship Specialty Notifications Start End  Leamon Arnt, MD PCP - General Family Medicine  09/06/19   Deboraha Sprang, MD PCP - Electrophysiology Cardiology  03/07/20   Gerda Diss, DO Consulting Physician Sports Medicine  05/25/18   Debbra Riding, MD Consulting Physician Ophthalmology  06/09/18   Deboraha Sprang, MD Consulting Physician Cardiology  10/07/19   Magnus Sinning, MD Consulting Physician Physical Medicine and Rehabilitation  10/07/19     Objective  Vitals: BP 108/68 (BP Location: Left Arm, Patient Position: Sitting, Cuff Size: Normal)   Pulse 78   Temp 98.5 F (36.9 C) (Oral)   Ht 5\' 5"  (1.651 m)   Wt 194 lb 4 oz (88.1 kg)   SpO2 96%   BMI 32.32 kg/m  General:  Well developed, well nourished, no acute distress  Psych:  Alert and  orientedx3,normal mood and affect HEENT:  Normocephalic, atraumatic, non-icteric sclera,  supple neck without adenopathy, mass or thyromegaly Cardiovascular:  Normal S1, S2, RRR without gallop, rub or murmur Respiratory:  Good breath sounds bilaterally, CTAB with normal respiratory effort Gastrointestinal: normal bowel sounds, soft, non-tender, no noted masses. No HSM MSK: no deformities, contusions. Joints are without erythema or swelling.  Skin:  Warm, no rashes or suspicious lesions noted Neurologic:    Mental status is normal. CN 2-11 are normal. Gross motor and sensory exams are normal. Normal gait. No tremor Breast Exam: No mass, skin retraction or nipple discharge is appreciated in either breast. No axillary adenopathy. Fibrocystic changes are not noted    Commons side effects, risks, benefits, and alternatives for medications and treatment plan prescribed today were discussed, and the patient expressed understanding of the given instructions. Patient is instructed to call or message via MyChart if he/she has any questions or concerns regarding our treatment plan. No barriers to understanding were identified. We discussed Red Flag symptoms and signs in detail. Patient expressed understanding regarding what to do in case of urgent or emergency type symptoms.   Medication list was reconciled, printed  and provided to the patient in AVS. Patient instructions and summary information was reviewed with the patient as documented in the AVS. This note was prepared with assistance of Dragon voice recognition software. Occasional wrong-word or sound-a-like substitutions may have occurred due to the inherent limitations of voice recognition software  This visit occurred during the SARS-CoV-2 public health emergency.  Safety protocols were in place, including screening questions prior to the visit, additional usage of staff PPE, and extensive cleaning of exam room while observing appropriate contact time  as indicated for disinfecting solutions.

## 2020-06-22 LAB — CBC WITH DIFFERENTIAL/PLATELET
Absolute Monocytes: 390 cells/uL (ref 200–950)
Basophils Absolute: 42 cells/uL (ref 0–200)
Basophils Relative: 0.7 %
Eosinophils Absolute: 150 cells/uL (ref 15–500)
Eosinophils Relative: 2.5 %
HCT: 41.2 % (ref 35.0–45.0)
Hemoglobin: 13.7 g/dL (ref 11.7–15.5)
Lymphs Abs: 2052 cells/uL (ref 850–3900)
MCH: 30.4 pg (ref 27.0–33.0)
MCHC: 33.3 g/dL (ref 32.0–36.0)
MCV: 91.6 fL (ref 80.0–100.0)
MPV: 10.7 fL (ref 7.5–12.5)
Monocytes Relative: 6.5 %
Neutro Abs: 3366 cells/uL (ref 1500–7800)
Neutrophils Relative %: 56.1 %
Platelets: 223 10*3/uL (ref 140–400)
RBC: 4.5 10*6/uL (ref 3.80–5.10)
RDW: 13.6 % (ref 11.0–15.0)
Total Lymphocyte: 34.2 %
WBC: 6 10*3/uL (ref 3.8–10.8)

## 2020-06-22 LAB — COMPLETE METABOLIC PANEL WITH GFR
AG Ratio: 1.8 (calc) (ref 1.0–2.5)
ALT: 26 U/L (ref 6–29)
AST: 17 U/L (ref 10–35)
Albumin: 4.5 g/dL (ref 3.6–5.1)
Alkaline phosphatase (APISO): 70 U/L (ref 37–153)
BUN: 16 mg/dL (ref 7–25)
CO2: 30 mmol/L (ref 20–32)
Calcium: 9 mg/dL (ref 8.6–10.4)
Chloride: 102 mmol/L (ref 98–110)
Creat: 0.58 mg/dL (ref 0.50–0.99)
GFR, Est African American: 110 mL/min/{1.73_m2} (ref >=60)
GFR, Est Non African American: 95 mL/min/{1.73_m2} (ref >=60)
Globulin: 2.5 g/dL (calc) (ref 1.9–3.7)
Glucose, Bld: 108 mg/dL — ABNORMAL HIGH (ref 65–99)
Potassium: 3.6 mmol/L (ref 3.5–5.3)
Sodium: 140 mmol/L (ref 135–146)
Total Bilirubin: 0.5 mg/dL (ref 0.2–1.2)
Total Protein: 7 g/dL (ref 6.1–8.1)

## 2020-06-22 LAB — LIPID PANEL
Cholesterol: 185 mg/dL (ref <200)
HDL: 66 mg/dL (ref >=50)
LDL Cholesterol (Calc): 89 mg/dL (calc)
Non-HDL Cholesterol (Calc): 119 mg/dL (calc) (ref <130)
Total CHOL/HDL Ratio: 2.8 (calc) (ref <5.0)
Triglycerides: 205 mg/dL — ABNORMAL HIGH (ref <150)

## 2020-06-22 LAB — HEMOGLOBIN A1C
Hgb A1c MFr Bld: 6.2 % of total Hgb — ABNORMAL HIGH (ref <5.7)
Mean Plasma Glucose: 131 (calc)
eAG (mmol/L): 7.3 (calc)

## 2020-06-22 LAB — TSH: TSH: 0.51 mIU/L (ref 0.40–4.50)

## 2020-06-26 ENCOUNTER — Encounter (INDEPENDENT_AMBULATORY_CARE_PROVIDER_SITE_OTHER): Payer: Self-pay

## 2020-06-26 NOTE — Telephone Encounter (Signed)
FYI

## 2020-07-03 ENCOUNTER — Encounter (INDEPENDENT_AMBULATORY_CARE_PROVIDER_SITE_OTHER): Payer: Self-pay | Admitting: Family Medicine

## 2020-07-03 ENCOUNTER — Other Ambulatory Visit: Payer: Self-pay

## 2020-07-03 ENCOUNTER — Ambulatory Visit (INDEPENDENT_AMBULATORY_CARE_PROVIDER_SITE_OTHER): Payer: PPO | Admitting: Family Medicine

## 2020-07-03 VITALS — BP 104/66 | HR 69 | Temp 98.2°F | Ht 65.0 in | Wt 192.0 lb

## 2020-07-03 DIAGNOSIS — E7849 Other hyperlipidemia: Secondary | ICD-10-CM

## 2020-07-03 DIAGNOSIS — E669 Obesity, unspecified: Secondary | ICD-10-CM | POA: Diagnosis not present

## 2020-07-03 DIAGNOSIS — R0602 Shortness of breath: Secondary | ICD-10-CM

## 2020-07-03 DIAGNOSIS — E559 Vitamin D deficiency, unspecified: Secondary | ICD-10-CM | POA: Diagnosis not present

## 2020-07-03 DIAGNOSIS — Z0289 Encounter for other administrative examinations: Secondary | ICD-10-CM

## 2020-07-03 DIAGNOSIS — E038 Other specified hypothyroidism: Secondary | ICD-10-CM | POA: Diagnosis not present

## 2020-07-03 DIAGNOSIS — R7303 Prediabetes: Secondary | ICD-10-CM | POA: Diagnosis not present

## 2020-07-03 DIAGNOSIS — Z6831 Body mass index (BMI) 31.0-31.9, adult: Secondary | ICD-10-CM | POA: Diagnosis not present

## 2020-07-03 DIAGNOSIS — R5383 Other fatigue: Secondary | ICD-10-CM

## 2020-07-03 DIAGNOSIS — Z1331 Encounter for screening for depression: Secondary | ICD-10-CM

## 2020-07-03 DIAGNOSIS — E039 Hypothyroidism, unspecified: Secondary | ICD-10-CM | POA: Diagnosis not present

## 2020-07-04 LAB — COMPREHENSIVE METABOLIC PANEL
ALT: 20 IU/L (ref 0–32)
AST: 18 IU/L (ref 0–40)
Albumin/Globulin Ratio: 2 (ref 1.2–2.2)
Albumin: 4.9 g/dL — ABNORMAL HIGH (ref 3.8–4.8)
Alkaline Phosphatase: 73 IU/L (ref 44–121)
BUN/Creatinine Ratio: 26 (ref 12–28)
BUN: 15 mg/dL (ref 8–27)
Bilirubin Total: 0.4 mg/dL (ref 0.0–1.2)
CO2: 27 mmol/L (ref 20–29)
Calcium: 8.9 mg/dL (ref 8.7–10.3)
Chloride: 99 mmol/L (ref 96–106)
Creatinine, Ser: 0.57 mg/dL (ref 0.57–1.00)
GFR calc Af Amer: 110 mL/min/{1.73_m2} (ref >59)
GFR calc non Af Amer: 96 mL/min/{1.73_m2} (ref >59)
Globulin, Total: 2.5 g/dL (ref 1.5–4.5)
Glucose: 92 mg/dL (ref 65–99)
Potassium: 3.8 mmol/L (ref 3.5–5.2)
Sodium: 138 mmol/L (ref 134–144)
Total Protein: 7.4 g/dL (ref 6.0–8.5)

## 2020-07-04 LAB — T3: T3, Total: 100 ng/dL (ref 71–180)

## 2020-07-04 LAB — VITAMIN D 25 HYDROXY (VIT D DEFICIENCY, FRACTURES): Vit D, 25-Hydroxy: 52.3 ng/mL (ref 30.0–100.0)

## 2020-07-04 LAB — T4: T4, Total: 8.9 ug/dL (ref 4.5–12.0)

## 2020-07-04 LAB — INSULIN, RANDOM: INSULIN: 21.9 u[IU]/mL (ref 2.6–24.9)

## 2020-07-05 NOTE — Progress Notes (Signed)
Dear Mary Hernandez L. Mary Sanger, MD,   Thank you for referring Mary Hernandez to our clinic. The following note includes my evaluation and treatment recommendations.  Chief Complaint:   OBESITY Mary Hernandez (MR# [id number]) is a 69 y.o. female who presents for evaluation and treatment of obesity and related comorbidities. Current BMI is Body mass index is 31.95 kg/m. Mary Hernandez has been struggling with her weight for many years and has been unsuccessful in either losing weight, maintaining weight loss, or reaching her healthy weight goal.  Mary Hernandez is currently in the action stage of change and ready to dedicate time achieving and maintaining a healthier weight. Mary Hernandez is interested in becoming our patient and working on intensive lifestyle modifications including (but not limited to) diet and exercise for weight loss.  Mary Hernandez's habits were reviewed today and are as follows: Her family eats meals together, she thinks her family will eat healthier with her, her desired weight loss is 41 lbs, she started gaining weight after having children, her heaviest weight ever was 192 pounds, she has significant food cravings issues, she snacks frequently in the evenings, she is frequently drinking liquids with calories, she frequently makes poor food choices, she has problems with excessive hunger, she frequently eats larger portions than normal and she struggles with emotional eating.  Depression Screen Mary Hernandez's Food and Mood (modified PHQ-9) score was 9.  Depression screen Mary Hernandez 2/9 07/03/2020  Decreased Interest 2  Down, Depressed, Hopeless 1  PHQ - 2 Score 3  Altered sleeping 0  Tired, decreased energy 2  Change in appetite 2  Feeling bad or failure about yourself  2  Trouble concentrating 0  Moving slowly or fidgety/restless 0  Suicidal thoughts 0  PHQ-9 Score 9  Difficult doing work/chores Not difficult at all   Subjective:   1. Other fatigue Mary Hernandez admits to daytime somnolence and  denies waking up still tired. Patent has a history of symptoms of daytime fatigue and morning headache. Mary Hernandez generally gets 7 or 8 hours of sleep per night, and states that she has generally restful sleep. Snoring is present. Apneic episodes are not present. Epworth Sleepiness Score is 9.  2. SOB (shortness of breath) on exertion Mary Hernandez notes increasing shortness of breath with exercising and seems to be worsening over time with weight gain. She notes getting out of breath sooner with activity than she used to. This has not gotten worse recently. Mary Hernandez denies shortness of breath at rest or orthopnea.  3. Pre-diabetes Mary Hernandez is on Ozempic 1 mg weekly (initial weight loss but plateau). Last A1c was 6.2.  4. Other specified hypothyroidism Mary Hernandez has a history of thyroid removal. She had a recent decrease in levothyroxine dose (now on 112 mcg). She denies cold/hold intolerance or palpitations.  5. Other hyperlipidemia Mary Hernandez has been on pravastatin for 10 years, and she denies myalgias. Last LDL was 89, HDL 66, and triglycerides 206.  6. Vitamin D deficiency Mary Hernandez is on calcium citrate/Vit D, and she notes fatigue.  Assessment/Plan:   1. Other fatigue Mary Hernandez does feel that her weight is causing her energy to be lower than it should be. Fatigue may be related to obesity, depression or many other causes. Labs will be ordered, and in the meanwhile, Mary Hernandez will focus on self care including making healthy food choices, increasing physical activity and focusing on stress reduction.  - EKG 12-Lead - Comprehensive metabolic panel  2. SOB (shortness of breath) on exertion Mary Hernandez does feel that she  gets out of breath more easily that she used to when she exercises. Mary Hernandez's shortness of breath Hernandez to be obesity related and exercise induced. She has agreed to work on weight loss and gradually increase exercise to treat her exercise induced shortness of breath. Will continue to monitor closely.  3.  Pre-diabetes Mary Hernandez will continue to work on weight loss, exercise, and decreasing simple carbohydrates to help decrease the risk of diabetes. We will check labs today.  - Insulin, random  4. Other specified hypothyroidism Patient with long-standing hypothyroidism, on levothyroxine therapy. Mary Hernandez euthyroid. We will check labs today. Orders and follow up as documented in patient record.  Counseling . Good thyroid control is important for overall health. Mary Hernandez thyroid levels are dangerous and will not improve weight loss results. . The correct way to take levothyroxine is fasting, with water, separated by at least 30 minutes from breakfast, and separated by more than 4 hours from calcium, iron, multivitamins, acid reflux medications (PPIs).   - T3 - T4  5. Other hyperlipidemia Cardiovascular risk and specific lipid/LDL goals reviewed. We discussed several lifestyle modifications today and Mary Hernandez will continue to work on diet, exercise and weight loss efforts. We will follow up on labs in 3 months. Orders and follow up as documented in patient record.   Counseling Intensive lifestyle modifications are the first line treatment for this issue. . Dietary changes: Increase soluble fiber. Decrease simple carbohydrates. . Exercise changes: Moderate to vigorous-intensity aerobic activity 150 minutes per week if tolerated. . Lipid-lowering medications: see documented in medical record.   6. Vitamin D deficiency Low Vitamin D level contributes to fatigue and are associated with obesity, breast, and colon cancer. We will check labs today. Mary Hernandez will follow-up for routine testing of Vitamin D, at least 2-3 times per year to avoid over-replacement.  - VITAMIN D 25 Hydroxy (Vit-D Deficiency, Fractures)  7. Depression Hernandez Mary Hernandez. Depression is commonly associated with obesity and often results in emotional eating behaviors. We will monitor this  closely and work on Mary Hernandez to help improve the non-hunger eating patterns. Referral to Psychology may be required if no improvement is seen as she continues in our clinic.  8. Class 1 obesity with serious comorbidity and body mass index (BMI) of 31.0 to 31.9 in adult, unspecified obesity type Shundra is currently in the action stage of change and her goal is to continue with weight loss efforts. I recommend Jahnavi begin the structured treatment plan as follows:  She has agreed to the Category 2 Plan.  Exercise goals: No exercise has been prescribed at this time.   Behavioral modification strategies: increasing lean protein intake, meal planning and cooking strategies, keeping healthy foods in the home and planning for success.  She was informed of the importance of frequent follow-up visits to maximize her success with intensive lifestyle modifications for her multiple health conditions. She was informed we would discuss her lab results at her next visit unless there is a critical issue that needs to be addressed sooner. Alani agreed to keep her next visit at the agreed upon time to discuss these results.  Objective:   Blood pressure 104/66, pulse 69, temperature 98.2 F (36.8 C), temperature source Oral, height 5\' 5"  (1.651 m), weight 192 lb (87.1 kg), SpO2 95 %. Body mass index is 31.95 kg/m.  EKG: Normal sinus rhythm, rate 72 BPM.  Indirect Calorimeter completed today shows a VO2 of 198 and a REE of 1378.  Her  calculated basal metabolic rate is 2575 thus her basal metabolic rate is worse than expected.  General: Cooperative, alert, well developed, in no acute distress. HEENT: Conjunctivae and lids unremarkable. Cardiovascular: Regular rhythm.  Lungs: Normal work of breathing. Neurologic: No focal deficits.   Lab Results  Component Value Date   CREATININE 0.57 07/03/2020   BUN 15 07/03/2020   NA 138 07/03/2020   K 3.8 07/03/2020   CL 99 07/03/2020   CO2 27 07/03/2020   Lab Results    Component Value Date   ALT 20 07/03/2020   AST 18 07/03/2020   ALKPHOS 73 07/03/2020   BILITOT 0.4 07/03/2020   Lab Results  Component Value Date   HGBA1C 6.2 (H) 06/21/2020   HGBA1C 5.9 (A) 10/07/2019   HGBA1C 6.2 05/17/2019   HGBA1C 5.9 10/12/2018   HGBA1C 6.1 05/01/2018   Lab Results  Component Value Date   INSULIN 21.9 07/03/2020   Lab Results  Component Value Date   TSH 0.51 06/21/2020   Lab Results  Component Value Date   CHOL 185 06/21/2020   HDL 66 06/21/2020   LDLCALC 89 06/21/2020   LDLDIRECT 94.0 05/01/2018   TRIG 205 (H) 06/21/2020   CHOLHDL 2.8 06/21/2020   Lab Results  Component Value Date   WBC 6.0 06/21/2020   HGB 13.7 06/21/2020   HCT 41.2 06/21/2020   MCV 91.6 06/21/2020   PLT 223 06/21/2020   No results found for: IRON, TIBC, FERRITIN Obesity Behavioral Intervention:   Approximately 15 minutes were spent on the discussion below.  ASK: We discussed the diagnosis of obesity with Edd Fabian today and [place] agreed to give [place] permission to discuss obesity behavioral modification therapy today.  ASSESS: Annelise has the diagnosis of obesity and her BMI today is 31.95. Cordelia is in the action stage of change.   ADVISE: Xee was educated on the multiple health risks of obesity as well as the benefit of weight loss to improve her health. She was advised of the need for long term treatment and the importance of lifestyle modifications to improve her current health and to decrease her risk of future health problems.  AGREE: Multiple dietary modification options and treatment options were discussed and Wynell agreed to follow the recommendations documented in the above note.  ARRANGE: Tonnia was educated on the importance of frequent visits to treat obesity as outlined per CMS and USPSTF guidelines and agreed to schedule her next follow up appointment today.  Attestation Statements:   Reviewed by clinician on day of visit: allergies, medications, problem  list, medical history, surgical history, family history, social history, and previous encounter notes.   I, Trixie Dredge, am acting as transcriptionist for Coralie Common, MD.  I have reviewed the above documentation for accuracy and completeness, and I agree with the above. - Jinny Blossom, MD

## 2020-07-12 DIAGNOSIS — L298 Other pruritus: Secondary | ICD-10-CM | POA: Diagnosis not present

## 2020-07-12 DIAGNOSIS — R208 Other disturbances of skin sensation: Secondary | ICD-10-CM | POA: Diagnosis not present

## 2020-07-13 ENCOUNTER — Encounter: Payer: Self-pay | Admitting: Family Medicine

## 2020-07-13 ENCOUNTER — Telehealth: Payer: Self-pay | Admitting: Physical Medicine and Rehabilitation

## 2020-07-13 NOTE — Telephone Encounter (Signed)
Please advise. I do not see any medications prescribed by you in her med list.

## 2020-07-13 NOTE — Telephone Encounter (Signed)
Please let her know what we found

## 2020-07-13 NOTE — Telephone Encounter (Signed)
Patient would like a list of medications Dr. Ernestina Patches prescribed. She treated with him in 2020. Callback [phone]

## 2020-07-13 NOTE — Telephone Encounter (Signed)
Called patient to advise  °

## 2020-07-14 ENCOUNTER — Encounter: Payer: Self-pay | Admitting: Family Medicine

## 2020-07-14 NOTE — Telephone Encounter (Signed)
Office visit 06/30/2019: can copy and mychart it or print it??   Thanks.

## 2020-07-17 ENCOUNTER — Encounter (INDEPENDENT_AMBULATORY_CARE_PROVIDER_SITE_OTHER): Payer: Self-pay | Admitting: Family Medicine

## 2020-07-17 ENCOUNTER — Other Ambulatory Visit: Payer: Self-pay

## 2020-07-17 ENCOUNTER — Ambulatory Visit (INDEPENDENT_AMBULATORY_CARE_PROVIDER_SITE_OTHER): Payer: PPO | Admitting: Family Medicine

## 2020-07-17 VITALS — BP 95/60 | HR 75 | Temp 98.2°F | Ht 65.0 in | Wt 189.0 lb

## 2020-07-17 DIAGNOSIS — E669 Obesity, unspecified: Secondary | ICD-10-CM

## 2020-07-17 DIAGNOSIS — E038 Other specified hypothyroidism: Secondary | ICD-10-CM | POA: Diagnosis not present

## 2020-07-17 DIAGNOSIS — Z6831 Body mass index (BMI) 31.0-31.9, adult: Secondary | ICD-10-CM

## 2020-07-17 DIAGNOSIS — E559 Vitamin D deficiency, unspecified: Secondary | ICD-10-CM

## 2020-07-17 DIAGNOSIS — R7303 Prediabetes: Secondary | ICD-10-CM

## 2020-07-17 DIAGNOSIS — K5909 Other constipation: Secondary | ICD-10-CM

## 2020-07-18 DIAGNOSIS — G44209 Tension-type headache, unspecified, not intractable: Secondary | ICD-10-CM | POA: Diagnosis not present

## 2020-07-18 DIAGNOSIS — M9905 Segmental and somatic dysfunction of pelvic region: Secondary | ICD-10-CM | POA: Diagnosis not present

## 2020-07-18 DIAGNOSIS — M9907 Segmental and somatic dysfunction of upper extremity: Secondary | ICD-10-CM | POA: Diagnosis not present

## 2020-07-18 DIAGNOSIS — M25511 Pain in right shoulder: Secondary | ICD-10-CM | POA: Diagnosis not present

## 2020-07-18 DIAGNOSIS — M9902 Segmental and somatic dysfunction of thoracic region: Secondary | ICD-10-CM | POA: Diagnosis not present

## 2020-07-18 DIAGNOSIS — M65341 Trigger finger, right ring finger: Secondary | ICD-10-CM | POA: Diagnosis not present

## 2020-07-18 DIAGNOSIS — M9904 Segmental and somatic dysfunction of sacral region: Secondary | ICD-10-CM | POA: Diagnosis not present

## 2020-07-18 DIAGNOSIS — M65331 Trigger finger, right middle finger: Secondary | ICD-10-CM | POA: Diagnosis not present

## 2020-07-18 DIAGNOSIS — M9903 Segmental and somatic dysfunction of lumbar region: Secondary | ICD-10-CM | POA: Diagnosis not present

## 2020-07-18 DIAGNOSIS — M9901 Segmental and somatic dysfunction of cervical region: Secondary | ICD-10-CM | POA: Diagnosis not present

## 2020-07-18 DIAGNOSIS — M25551 Pain in right hip: Secondary | ICD-10-CM | POA: Diagnosis not present

## 2020-07-18 DIAGNOSIS — M545 Low back pain, unspecified: Secondary | ICD-10-CM | POA: Diagnosis not present

## 2020-07-18 NOTE — Progress Notes (Signed)
Chief Complaint:   OBESITY Mary Hernandez is here to discuss her progress with her obesity treatment plan along with follow-up of her obesity related diagnoses. Mary Hernandez is on the Category 2 Plan and states she is following her eating plan approximately 80% of the time. Mary Hernandez states she is walking for 13 minutes 5 times per week, doing pilates for 60 minutes 1 time per week, and stretching for 5-10 minutes 4 times per week.  Today's visit was #: 2 Starting weight: 192 lbs Starting date: 07/03/2020 Today's weight: 189 lbs Today's date: 07/17/2020 Total lbs lost to date: 3 Total lbs lost since last in-office visit: 3  Interim History: Mary Hernandez was frustrated quite a bit because she was thinking about food and occasionally felt guilty if making indulgent choices. She is eating 2 eggs with toast for breakfast, apple for snack, yogurt or cheese for snack, sandwich for lunch. A couple of nights she couldn't eat all the protein. She is looking for a way to count the amount of protein daily.  Subjective:   1. Pre-diabetes Mary Hernandez's last A1c was 6.2 and insulin 21.9. She is on Ozempic 1 mg weekly. She notes hunger in the evening. I discussed labs with the patient today.  2. Other specified hypothyroidism Mary Hernandez is on levothyroxine 112 mcg daily. She denies cold/heat intolerance or palpitations. Last TSH was 0.51. I discussed labs with the patient today.  3. Vitamin D deficiency Mary Hernandez's last Vit D level was 52.3. She is on calcium + Vit D supplementation. I discussed labs with the patient today.  4. Other constipation Mary Hernandez previously took fiber One daily. She is asking for increased fiber in her diet.  Assessment/Plan:   1. Pre-diabetes Janylah will continue to work on weight loss, exercise, and decreasing simple carbohydrates to help decrease the risk of diabetes. Helayne will continue Ozempic with no change in dose.  2. Other specified hypothyroidism Patient with long-standing hypothyroidism. Mihaela will  continue levothyroxine, no change in dose. She appears euthyroid. Orders and follow up as documented in patient record.  Counseling . Good thyroid control is important for overall health. [name], with water, separated by at least 30 minutes from breakfast, and separated by more than 4 hours from calcium, iron, multivitamins, acid reflux medications (PPIs).   3. Vitamin D deficiency Low Vitamin D level contributes to fatigue and are associated with obesity, breast, and colon cancer. [name] agreed to continue taking her current supplementation and will follow-up for routine testing of Vitamin D, at least 2-3 times per year to avoid over-replacement.  4. Other constipation Mary Hernandez was informed that a decrease in bowel movement frequency is normal while losing weight, but stools should not be hard or painful. Mary Hernandez was given suggestions on increasing fiber in her diet. She can also incorporate miralax, Benefiber, or Metamucil. Orders and follow up as documented in patient record.   Counseling Getting to Good Bowel Health: Your goal is to have one soft bowel movement each day. Drink at least 8 glasses of water each day. Eat plenty of fiber (goal is over 25 grams each day). It is best to get most of your fiber from dietary sources which includes leafy green vegetables, fresh fruit, and whole grains. You may need to add fiber with the help of OTC fiber supplements. These include Metamucil, Citrucel, and Flaxseed. If you are still having trouble, try adding Miralax  or Magnesium Citrate. If all of these changes do not work, Cabin crew.  5. Class 1 obesity with serious comorbidity and body mass index (BMI) of 31.0 to 31.9 in adult, unspecified obesity type Mary Hernandez is currently in the action stage of change. As such, her goal is to continue with weight loss efforts. She has  agreed to the Category 2 Plan or keeping a food journal and adhering to recommended goals of 1100-1200 calories and 80+ grams of protein daily.   Exercise goals: As is.  Behavioral modification strategies: increasing lean protein intake, meal planning and cooking strategies, keeping healthy foods in the home, planning for success and keeping a strict food journal.  Mary Hernandez has agreed to follow-up with our clinic in 2 weeks. She was informed of the importance of frequent follow-up visits to maximize her success with intensive lifestyle modifications for her multiple health conditions.   Objective:   Blood pressure 95/60, pulse 75, temperature 98.2 F (36.8 C), temperature source Oral, height 5\' 5"  (1.651 m), weight 189 lb (85.7 kg), SpO2 97 %. Body mass index is 31.45 kg/m.  General: Cooperative, alert, well developed, in no acute distress. HEENT: Conjunctivae and lids unremarkable. Cardiovascular: Regular rhythm.  Lungs: Normal work of breathing. Neurologic: No focal deficits.   Lab Results  Component Value Date   CREATININE 0.57 07/03/2020   BUN 15 07/03/2020   NA 138 07/03/2020   K 3.8 07/03/2020   CL 99 07/03/2020   CO2 27 07/03/2020   Lab Results  Component Value Date   ALT 20 07/03/2020   AST 18 07/03/2020   ALKPHOS 73 07/03/2020   BILITOT 0.4 07/03/2020   Lab Results  Component Value Date   HGBA1C 6.2 (H) 06/21/2020   HGBA1C 5.9 (A) 10/07/2019   HGBA1C 6.2 05/17/2019   HGBA1C 5.9 10/12/2018   HGBA1C 6.1 05/01/2018   Lab Results  Component Value Date   INSULIN 21.9 07/03/2020   Lab Results  Component Value Date   TSH 0.51 06/21/2020   Lab Results  Component Value Date   CHOL 185 06/21/2020   HDL 66 06/21/2020   LDLCALC 89 06/21/2020   LDLDIRECT 94.0 05/01/2018   TRIG 205 (H) 06/21/2020   CHOLHDL 2.8 06/21/2020   Lab Results  Component Value Date   WBC 6.0 06/21/2020   HGB 13.7 06/21/2020   HCT 41.2 06/21/2020   MCV 91.6 06/21/2020   PLT 223  06/21/2020   No results found for: IRON, TIBC, FERRITIN  Attestation Statements:   Reviewed by clinician on day of visit: allergies, medications, problem list, medical history, surgical history, family history, social history, and previous encounter notes.  Time spent on visit including pre-visit chart review and post-visit care and charting was 42 minutes.    I, Trixie Dredge, am acting as transcriptionist for Coralie Common, MD. I have reviewed the above documentation for accuracy and completeness, and I agree with the above. - Jinny Blossom, MD

## 2020-07-19 ENCOUNTER — Other Ambulatory Visit: Payer: Self-pay

## 2020-07-19 ENCOUNTER — Ambulatory Visit
Admission: RE | Admit: 2020-07-19 | Discharge: 2020-07-19 | Disposition: A | Discharge status: Home / Self Care | Payer: PPO | Source: Ambulatory Visit | Attending: Sports Medicine | Admitting: Sports Medicine

## 2020-07-19 ENCOUNTER — Other Ambulatory Visit: Payer: Self-pay | Admitting: Sports Medicine

## 2020-07-19 DIAGNOSIS — M545 Low back pain, unspecified: Secondary | ICD-10-CM

## 2020-07-19 DIAGNOSIS — M25551 Pain in right hip: Secondary | ICD-10-CM | POA: Diagnosis not present

## 2020-07-25 IMAGING — CT CT HEART MORP W/ CTA COR W/ SCORE W/ CA W/CM &/OR W/O CM
4 of 7 series · 8 of 20 positions shown, 9 images · IV contrast (APPLIED)
Comparison: None.

CLINICAL DATA: PVC;s

EXAM:
Cardiac CTA
MEDICATIONS:
Sub lingual nitro. 4mg and lopressor 5mg
TECHNIQUE: The patient was scanned on a Siemens [REDACTED]ice scanner. Gantry
rotation speed was 270 msecs. Collimation was .9mm. A 100 kV
prospective scan was triggered in the descending thoracic aorta at
111 HU's with 5% padding centered around 78% of the R-R interval.
Average HR during the scan was 66 bpm. The 3D data set was
interpreted on a dedicated work station using MPR, MIP and VRT
modes. A total of 80cc of contrast was used.

[Series 6: best diast 73 % · axial · 0.29mm/px · z∈[+1214,+1253]mm · 2 of 296 slices shown, 3 images]
[im 99/296  vessel]
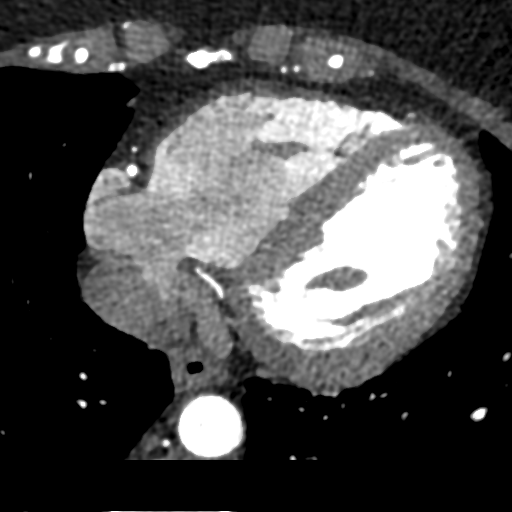
[im 99/296  lung]
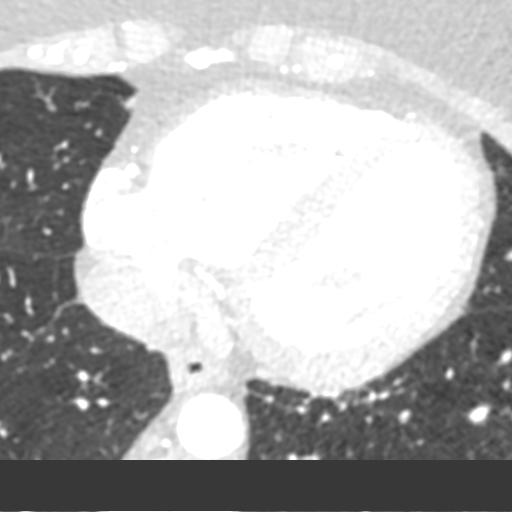
[im 197/296  vessel]
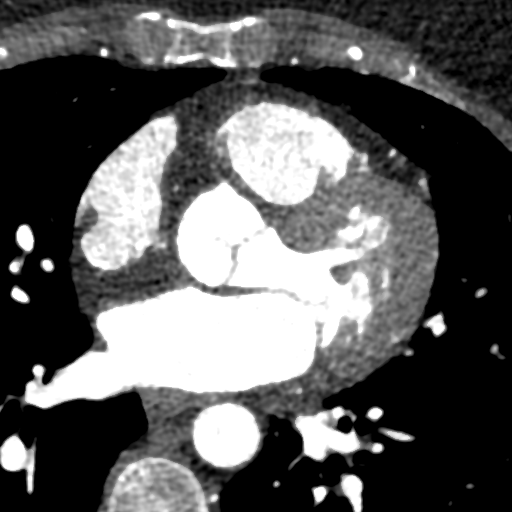

[Series 7: best syst 38 % · axial · 0.29mm/px · z∈[+1214,+1253]mm · 2 of 296 slices shown]
[im 99/296  vessel]
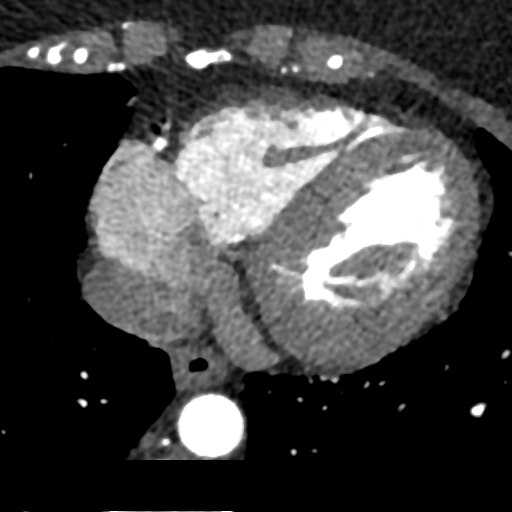
[im 197/296  vessel]
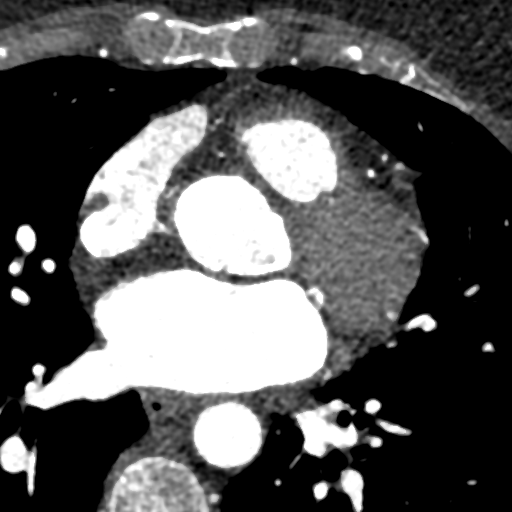

[Series 8: ts diast sharp 73 % · axial · 0.29mm/px · z∈[+1214,+1253]mm · 2 of 296 slices shown]
[im 99/296  lung]
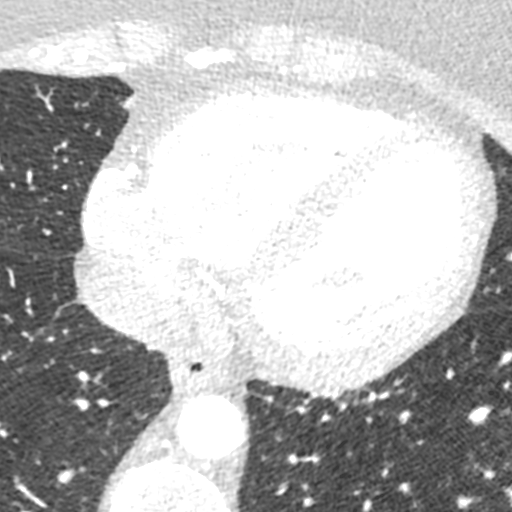
[im 197/296  lung]
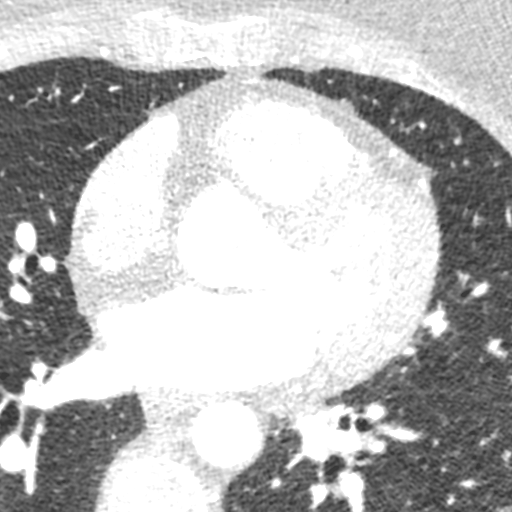

[Series 9: ts syst sharp 38 % · axial · 0.29mm/px · z∈[+1214,+1253]mm · 2 of 296 slices shown]
[im 99/296  lung]
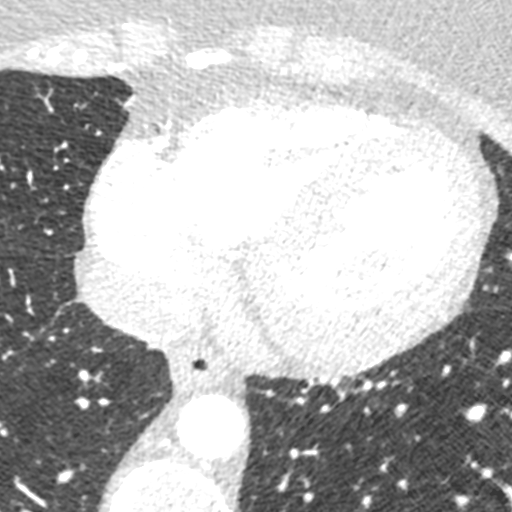
[im 197/296  lung]
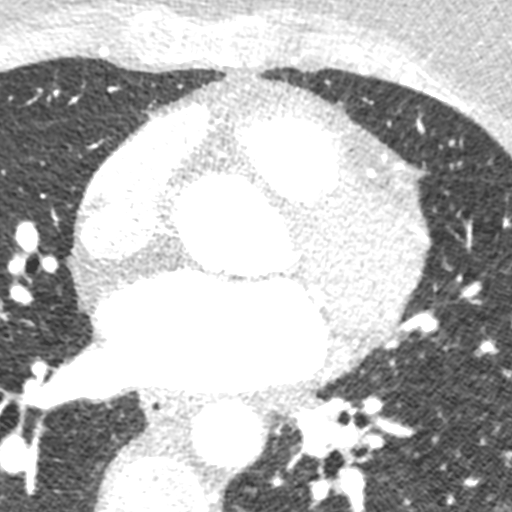

[8 of 20 positions shown; findings below may reference images not displayed]

FINDINGS: Non-cardiac: See separate report from [REDACTED]. No
significant findings on limited lung and soft tissue windows.

Calcium Score: No calcium detected

Coronary Arteries: Right dominant with no anomalies

LM: Normal

LAD: Normal
IM: Normal

D1: Normal

D2: Normal

Circumflex: Normal

OM1: Normal

OM2: Normal

RCA: Normal

PDA: Normal

PLA: Normal
IMPRESSION: 1.  Calcium score 0

2.  Normal right dominant coronary arteries

3.  Normal aortic root 3.2 cm

Juael Saldanha

EXAM:
OVER-READ INTERPRETATION  CT CHEST

The following report is an over-read performed by radiologist Dr.
Khom Ordinario [REDACTED] on 07/08/2018. This over-read
does not include interpretation of cardiac or coronary anatomy or
pathology. The coronary CTA interpretation by the cardiologist is
attached.
FINDINGS: Vascular: Heart is normal size.  Visualized aorta is normal caliber.

Mediastinum/Nodes: No adenopathy in the lower mediastinum or hila.

Lungs/Pleura: No confluent opacities or effusions.

Upper Abdomen: Imaging into the upper abdomen shows no acute
findings.

Musculoskeletal: Chest wall soft tissues are unremarkable. No acute
bony abnormality.
IMPRESSION: No acute or significant extracardiac abnormality.

## 2020-07-28 ENCOUNTER — Other Ambulatory Visit: Payer: Self-pay | Admitting: Family Medicine

## 2020-07-30 ENCOUNTER — Encounter: Payer: Self-pay | Admitting: Family Medicine

## 2020-07-31 ENCOUNTER — Other Ambulatory Visit: Payer: Self-pay

## 2020-07-31 DIAGNOSIS — R42 Dizziness and giddiness: Secondary | ICD-10-CM

## 2020-07-31 MED ORDER — GLYCOPYRROLATE 1 MG PO TABS: 1.0000 mg | ORAL_TABLET | Freq: Two times a day (BID) | ORAL | 1 refills | Status: DC

## 2020-08-01 DIAGNOSIS — M25551 Pain in right hip: Secondary | ICD-10-CM | POA: Diagnosis not present

## 2020-08-01 DIAGNOSIS — M9902 Segmental and somatic dysfunction of thoracic region: Secondary | ICD-10-CM | POA: Diagnosis not present

## 2020-08-01 DIAGNOSIS — M9901 Segmental and somatic dysfunction of cervical region: Secondary | ICD-10-CM | POA: Diagnosis not present

## 2020-08-01 DIAGNOSIS — M9905 Segmental and somatic dysfunction of pelvic region: Secondary | ICD-10-CM | POA: Diagnosis not present

## 2020-08-01 DIAGNOSIS — M9904 Segmental and somatic dysfunction of sacral region: Secondary | ICD-10-CM | POA: Diagnosis not present

## 2020-08-01 DIAGNOSIS — M9908 Segmental and somatic dysfunction of rib cage: Secondary | ICD-10-CM | POA: Diagnosis not present

## 2020-08-01 DIAGNOSIS — M65331 Trigger finger, right middle finger: Secondary | ICD-10-CM | POA: Diagnosis not present

## 2020-08-01 DIAGNOSIS — M9903 Segmental and somatic dysfunction of lumbar region: Secondary | ICD-10-CM | POA: Diagnosis not present

## 2020-08-01 DIAGNOSIS — M25511 Pain in right shoulder: Secondary | ICD-10-CM | POA: Diagnosis not present

## 2020-08-01 DIAGNOSIS — M65341 Trigger finger, right ring finger: Secondary | ICD-10-CM | POA: Diagnosis not present

## 2020-08-01 DIAGNOSIS — M9907 Segmental and somatic dysfunction of upper extremity: Secondary | ICD-10-CM | POA: Diagnosis not present

## 2020-08-03 ENCOUNTER — Other Ambulatory Visit: Payer: Self-pay | Admitting: Family Medicine

## 2020-08-03 DIAGNOSIS — H0288A Meibomian gland dysfunction right eye, upper and lower eyelids: Secondary | ICD-10-CM | POA: Diagnosis not present

## 2020-08-03 DIAGNOSIS — E119 Type 2 diabetes mellitus without complications: Secondary | ICD-10-CM | POA: Diagnosis not present

## 2020-08-03 DIAGNOSIS — Z9889 Other specified postprocedural states: Secondary | ICD-10-CM | POA: Diagnosis not present

## 2020-08-03 DIAGNOSIS — H5201 Hypermetropia, right eye: Secondary | ICD-10-CM | POA: Diagnosis not present

## 2020-08-03 DIAGNOSIS — H6982 Other specified disorders of Eustachian tube, left ear: Secondary | ICD-10-CM

## 2020-08-03 DIAGNOSIS — H5212 Myopia, left eye: Secondary | ICD-10-CM | POA: Diagnosis not present

## 2020-08-03 DIAGNOSIS — H35371 Puckering of macula, right eye: Secondary | ICD-10-CM | POA: Diagnosis not present

## 2020-08-03 DIAGNOSIS — H16223 Keratoconjunctivitis sicca, not specified as Sjogren's, bilateral: Secondary | ICD-10-CM | POA: Diagnosis not present

## 2020-08-03 DIAGNOSIS — Z794 Long term (current) use of insulin: Secondary | ICD-10-CM | POA: Diagnosis not present

## 2020-08-03 DIAGNOSIS — H524 Presbyopia: Secondary | ICD-10-CM | POA: Diagnosis not present

## 2020-08-03 DIAGNOSIS — H52203 Unspecified astigmatism, bilateral: Secondary | ICD-10-CM | POA: Diagnosis not present

## 2020-08-03 DIAGNOSIS — H2513 Age-related nuclear cataract, bilateral: Secondary | ICD-10-CM | POA: Diagnosis not present

## 2020-08-03 DIAGNOSIS — H0288B Meibomian gland dysfunction left eye, upper and lower eyelids: Secondary | ICD-10-CM | POA: Diagnosis not present

## 2020-08-04 ENCOUNTER — Other Ambulatory Visit: Payer: Self-pay

## 2020-08-04 ENCOUNTER — Encounter: Payer: Self-pay | Admitting: Family Medicine

## 2020-08-04 DIAGNOSIS — H6982 Other specified disorders of Eustachian tube, left ear: Secondary | ICD-10-CM

## 2020-08-04 MED ORDER — FLUTICASONE PROPIONATE 50 MCG/ACT NA SUSP: 2.0000 | Freq: Every day | NASAL | 6 refills | Status: DC

## 2020-08-09 ENCOUNTER — Encounter (INDEPENDENT_AMBULATORY_CARE_PROVIDER_SITE_OTHER): Payer: Self-pay | Admitting: Family Medicine

## 2020-08-09 ENCOUNTER — Ambulatory Visit (INDEPENDENT_AMBULATORY_CARE_PROVIDER_SITE_OTHER): Payer: PPO | Admitting: Family Medicine

## 2020-08-09 ENCOUNTER — Other Ambulatory Visit: Payer: Self-pay

## 2020-08-09 VITALS — BP 102/56 | HR 69 | Temp 98.0°F | Ht 65.0 in | Wt 187.0 lb

## 2020-08-09 DIAGNOSIS — E669 Obesity, unspecified: Secondary | ICD-10-CM | POA: Diagnosis not present

## 2020-08-09 DIAGNOSIS — R7303 Prediabetes: Secondary | ICD-10-CM

## 2020-08-09 DIAGNOSIS — E781 Pure hyperglyceridemia: Secondary | ICD-10-CM

## 2020-08-09 DIAGNOSIS — Z6831 Body mass index (BMI) 31.0-31.9, adult: Secondary | ICD-10-CM | POA: Diagnosis not present

## 2020-08-10 NOTE — Progress Notes (Signed)
Chief Complaint:   OBESITY Mary Hernandez is here to discuss her progress with her obesity treatment plan along with follow-up of her obesity related diagnoses. Mary Hernandez is on the Category 2 Plan and keeping a food journal and adhering to recommended goals of 1100-1200 calories and 80+ grams of protein daily and states she is following her eating plan approximately 70% of the time. Mary Hernandez states she is walking for18 minutes 4 times per week, pilates for 60 minutes 1 time per week, stretching for 10 minutes 5 times per week.  Today's visit was #: 3 Starting weight: 192 lbs Starting date: 07/03/2020 Today's weight: 187 lbs Today's date: 08/09/2020 Total lbs lost to date: 5 Total lbs lost since last in-office visit: 2  Interim History: Mary Hernandez voices that she was really on the plan the first 2 weeks, but wasn't as on the plan the last week. She is eating more kiwi to help with constipation. She notes hunger mostly late afternoon and evening around 9 pm. Normally getting or eating yogurt, and not always getting 6 oz of meat at dinner. She is going to the grocery store after this appointment. Going to [place] November 16th for 2 weeks.  Subjective:   1. Pre-diabetes Mary Hernandez is on Ozempic 1 mg SubQ, no control of hunger anymore.  2. Hypertriglyceridemia Mary Hernandez is not on fenofibrate but she is on statin. Last labs were done in September.  Assessment/Plan:   1. Pre-diabetes Mary Hernandez will continue Ozempic, no change in dose. She will continue to work on weight loss, exercise, and decreasing simple carbohydrates to help decrease the risk of diabetes.   2. Hypertriglyceridemia We will repeat labs in February 2022, and Mary Hernandez will follow up as directed.  3. Class 1 obesity with serious comorbidity and body mass index (BMI) of 31.0 to 31.9 in adult, unspecified obesity type Mary Hernandez is currently in the action stage of change. As such, her goal is to continue with weight loss efforts. She has agreed to the Category  2 Plan with 8 oz of protein or substitution for dinner.   Exercise goals: As is.  Behavioral modification strategies: increasing lean protein intake, meal planning and cooking strategies, keeping healthy foods in the home and holiday eating strategies .  Mary Hernandez has agreed to follow-up with our clinic in 3 to 4 weeks. She was informed of the importance of frequent follow-up visits to maximize her success with intensive lifestyle modifications for her multiple health conditions.   Objective:   Blood pressure (!) 102/56, pulse 69, temperature 98 F (36.7 C), temperature source Oral, height 5\' 5"  (1.651 m), weight 187 lb (84.8 kg), SpO2 95 %. Body mass index is 31.12 kg/m.  General: Cooperative, alert, well developed, in no acute distress. HEENT: Conjunctivae and lids unremarkable. Cardiovascular: Regular rhythm.  Lungs: Normal work of breathing. Neurologic: No focal deficits.   Lab Results  Component Value Date   CREATININE 0.57 07/03/2020   BUN 15 07/03/2020   NA 138 07/03/2020   K 3.8 07/03/2020   CL 99 07/03/2020   CO2 27 07/03/2020   Lab Results  Component Value Date   ALT 20 07/03/2020   AST 18 07/03/2020   ALKPHOS 73 07/03/2020   BILITOT 0.4 07/03/2020   Lab Results  Component Value Date   HGBA1C 6.2 (H) 06/21/2020   HGBA1C 5.9 (A) 10/07/2019   HGBA1C 6.2 05/17/2019   HGBA1C 5.9 10/12/2018   HGBA1C 6.1 05/01/2018   Lab Results  Component Value Date   INSULIN  21.9 07/03/2020   Lab Results  Component Value Date   TSH 0.51 06/21/2020   Lab Results  Component Value Date   CHOL 185 06/21/2020   HDL 66 06/21/2020   LDLCALC 89 06/21/2020   LDLDIRECT 94.0 05/01/2018   TRIG 205 (H) 06/21/2020   CHOLHDL 2.8 06/21/2020   Lab Results  Component Value Date   WBC 6.0 06/21/2020   HGB 13.7 06/21/2020   HCT 41.2 06/21/2020   MCV 91.6 06/21/2020   PLT 223 06/21/2020   No results found for: IRON, TIBC, FERRITIN  Attestation Statements:   Reviewed by clinician  on day of visit: allergies, medications, problem list, medical history, surgical history, family history, social history, and previous encounter notes.  Time spent on visit including pre-visit chart review and post-visit care and charting was 16 minutes.    I, Trixie Dredge, am acting as transcriptionist for Coralie Common, MD.  I have reviewed the above documentation for accuracy and completeness, and I agree with the above. - Jinny Blossom, MD

## 2020-08-14 DIAGNOSIS — G4486 Cervicogenic headache: Secondary | ICD-10-CM | POA: Diagnosis not present

## 2020-08-14 DIAGNOSIS — M9901 Segmental and somatic dysfunction of cervical region: Secondary | ICD-10-CM | POA: Diagnosis not present

## 2020-08-14 DIAGNOSIS — M25551 Pain in right hip: Secondary | ICD-10-CM | POA: Diagnosis not present

## 2020-08-14 DIAGNOSIS — M9904 Segmental and somatic dysfunction of sacral region: Secondary | ICD-10-CM | POA: Diagnosis not present

## 2020-08-14 DIAGNOSIS — M9906 Segmental and somatic dysfunction of lower extremity: Secondary | ICD-10-CM | POA: Diagnosis not present

## 2020-08-14 DIAGNOSIS — M542 Cervicalgia: Secondary | ICD-10-CM | POA: Diagnosis not present

## 2020-08-14 DIAGNOSIS — M9903 Segmental and somatic dysfunction of lumbar region: Secondary | ICD-10-CM | POA: Diagnosis not present

## 2020-08-14 DIAGNOSIS — M9905 Segmental and somatic dysfunction of pelvic region: Secondary | ICD-10-CM | POA: Diagnosis not present

## 2020-08-14 DIAGNOSIS — M9902 Segmental and somatic dysfunction of thoracic region: Secondary | ICD-10-CM | POA: Diagnosis not present

## 2020-08-14 DIAGNOSIS — M545 Low back pain, unspecified: Secondary | ICD-10-CM | POA: Diagnosis not present

## 2020-08-21 DIAGNOSIS — M9907 Segmental and somatic dysfunction of upper extremity: Secondary | ICD-10-CM | POA: Diagnosis not present

## 2020-08-21 DIAGNOSIS — M9906 Segmental and somatic dysfunction of lower extremity: Secondary | ICD-10-CM | POA: Diagnosis not present

## 2020-08-21 DIAGNOSIS — M9901 Segmental and somatic dysfunction of cervical region: Secondary | ICD-10-CM | POA: Diagnosis not present

## 2020-08-21 DIAGNOSIS — M25551 Pain in right hip: Secondary | ICD-10-CM | POA: Diagnosis not present

## 2020-08-21 DIAGNOSIS — M9902 Segmental and somatic dysfunction of thoracic region: Secondary | ICD-10-CM | POA: Diagnosis not present

## 2020-08-21 DIAGNOSIS — M542 Cervicalgia: Secondary | ICD-10-CM | POA: Diagnosis not present

## 2020-08-21 DIAGNOSIS — M25511 Pain in right shoulder: Secondary | ICD-10-CM | POA: Diagnosis not present

## 2020-08-21 DIAGNOSIS — M9908 Segmental and somatic dysfunction of rib cage: Secondary | ICD-10-CM | POA: Diagnosis not present

## 2020-09-08 DIAGNOSIS — M9901 Segmental and somatic dysfunction of cervical region: Secondary | ICD-10-CM | POA: Diagnosis not present

## 2020-09-08 DIAGNOSIS — M542 Cervicalgia: Secondary | ICD-10-CM | POA: Diagnosis not present

## 2020-09-08 DIAGNOSIS — M9905 Segmental and somatic dysfunction of pelvic region: Secondary | ICD-10-CM | POA: Diagnosis not present

## 2020-09-08 DIAGNOSIS — M9902 Segmental and somatic dysfunction of thoracic region: Secondary | ICD-10-CM | POA: Diagnosis not present

## 2020-09-08 DIAGNOSIS — M9904 Segmental and somatic dysfunction of sacral region: Secondary | ICD-10-CM | POA: Diagnosis not present

## 2020-09-08 DIAGNOSIS — G4486 Cervicogenic headache: Secondary | ICD-10-CM | POA: Diagnosis not present

## 2020-09-08 DIAGNOSIS — M9906 Segmental and somatic dysfunction of lower extremity: Secondary | ICD-10-CM | POA: Diagnosis not present

## 2020-09-08 DIAGNOSIS — M545 Low back pain, unspecified: Secondary | ICD-10-CM | POA: Diagnosis not present

## 2020-09-08 DIAGNOSIS — M9903 Segmental and somatic dysfunction of lumbar region: Secondary | ICD-10-CM | POA: Diagnosis not present

## 2020-09-08 DIAGNOSIS — M25551 Pain in right hip: Secondary | ICD-10-CM | POA: Diagnosis not present

## 2020-09-13 ENCOUNTER — Other Ambulatory Visit: Payer: Self-pay

## 2020-09-13 ENCOUNTER — Ambulatory Visit (INDEPENDENT_AMBULATORY_CARE_PROVIDER_SITE_OTHER): Payer: PPO | Admitting: Family Medicine

## 2020-09-13 ENCOUNTER — Encounter (INDEPENDENT_AMBULATORY_CARE_PROVIDER_SITE_OTHER): Payer: Self-pay | Admitting: Family Medicine

## 2020-09-13 VITALS — BP 106/67 | HR 70 | Temp 97.7°F | Ht 65.0 in | Wt 184.0 lb

## 2020-09-13 DIAGNOSIS — R7303 Prediabetes: Secondary | ICD-10-CM

## 2020-09-13 DIAGNOSIS — E038 Other specified hypothyroidism: Secondary | ICD-10-CM

## 2020-09-13 DIAGNOSIS — E669 Obesity, unspecified: Secondary | ICD-10-CM | POA: Diagnosis not present

## 2020-09-13 DIAGNOSIS — Z6830 Body mass index (BMI) 30.0-30.9, adult: Secondary | ICD-10-CM

## 2020-09-13 MED ORDER — OZEMPIC (0.25 OR 0.5 MG/DOSE) 2 MG/1.5ML ~~LOC~~ SOPN: 0.5000 mg | PEN_INJECTOR | SUBCUTANEOUS | 0 refills | Status: DC

## 2020-09-14 NOTE — Progress Notes (Signed)
Chief Complaint:   OBESITY Mary Hernandez is here to discuss her progress with her obesity treatment plan along with follow-up of her obesity related diagnoses. Mary Hernandez is on the Category 2 Plan with 8 oz of protein at dinner and states she is following her eating plan approximately 50% of the time. Mary Hernandez states she is walking for 20 minutes 3 times per week, and swimming for 30 minutes 1 time per week.  Today's visit was #: 4 Starting weight: 192 lbs Starting date: 07/03/2020 Today's weight: 184 lbs Today's date: 09/13/2020 Total lbs lost to date: 8 Total lbs lost since last in-office visit: 3  Interim History: Wade went to [place] for 2 weeks and she had a great time with visiting family. She got back 1 weeks ago. Recently started on Naltrexone by Dr. Paulla Fore. She is feeling hungry frequently. She is trying to increase protein and increase vegetables in the upcoming few weeks.  Subjective:   1. Pre-diabetes Mary Hernandez is on Ozempic 1 mg SubQ weekly, and she denies nausea or vomiting. Previously controlled of hunger with Ozempic initially.  2. Other specified hypothyroidism Mary Hernandez's last TSH was 0.51, and T3 and T4 are within normal limits. She is on levothyroxine.  Assessment/Plan:   1. Pre-diabetes Mary Hernandez will continue to work on weight loss, exercise, and decreasing simple carbohydrates to help decrease the risk of diabetes. Mary Hernandez agreed to change Ozempic to 0.50 mg SubQ twice weekly with no refills.  - Semaglutide,0.25 or 0.5MG /DOS, (OZEMPIC, 0.25 OR 0.5 MG/DOSE,) 2 MG/1.5ML SOPN; Inject 0.5 mg into the skin 2 (two) times a week.  Dispense: 3 mL; Refill: 0  2. Other specified hypothyroidism Patient with long-standing hypothyroidism, and Mary Hernandez will continue levothyroxine therapy. She appears euthyroid. We will repeat labs at her next appointment. Orders and follow up as documented in patient record.  Counseling . Good thyroid control is important for overall health. Supratherapeutic thyroid  levels are dangerous and will not improve weight loss results. . The correct way to take levothyroxine is fasting, with water, separated by at least 30 minutes from breakfast, and separated by more than 4 hours from calcium, iron, multivitamins, acid reflux medications (PPIs).   3. Class 1 obesity with serious comorbidity and body mass index (BMI) of 30.0 to 30.9 in adult, unspecified obesity type Mary Hernandez is currently in the action stage of change. As such, her goal is to continue with weight loss efforts. She has agreed to practicing portion control and making smarter food choices, such as increasing vegetables and decreasing simple carbohydrates.   Exercise goals: All adults should avoid inactivity. Some physical activity is better than none, and adults who participate in any amount of physical activity gain some health benefits.  Behavioral modification strategies: increasing lean protein intake, meal planning and cooking strategies, keeping healthy foods in the home and planning for success.  Mary Hernandez has agreed to follow-up with our clinic in 3 weeks. She was informed of the importance of frequent follow-up visits to maximize her success with intensive lifestyle modifications for her multiple health conditions.   Objective:   Blood pressure 106/67, pulse 70, temperature 97.7 F (36.5 C), temperature source Oral, height 5\' 5"  (1.651 m), weight 184 lb (83.5 kg), SpO2 96 %. Body mass index is 30.62 kg/m.  General: Cooperative, alert, well developed, in no acute distress. HEENT: Conjunctivae and lids unremarkable. Cardiovascular: Regular rhythm.  Lungs: Normal work of breathing. Neurologic: No focal deficits.   Lab Results  Component Value Date   CREATININE  0.57 07/03/2020   BUN 15 07/03/2020   NA 138 07/03/2020   K 3.8 07/03/2020   CL 99 07/03/2020   CO2 27 07/03/2020   Lab Results  Component Value Date   ALT 20 07/03/2020   AST 18 07/03/2020   ALKPHOS 73 07/03/2020   BILITOT 0.4  07/03/2020   Lab Results  Component Value Date   HGBA1C 6.2 (H) 06/21/2020   HGBA1C 5.9 (A) 10/07/2019   HGBA1C 6.2 05/17/2019   HGBA1C 5.9 10/12/2018   HGBA1C 6.1 05/01/2018   Lab Results  Component Value Date   INSULIN 21.9 07/03/2020   Lab Results  Component Value Date   TSH 0.51 06/21/2020   Lab Results  Component Value Date   CHOL 185 06/21/2020   HDL 66 06/21/2020   LDLCALC 89 06/21/2020   LDLDIRECT 94.0 05/01/2018   TRIG 205 (H) 06/21/2020   CHOLHDL 2.8 06/21/2020   Lab Results  Component Value Date   WBC 6.0 06/21/2020   HGB 13.7 06/21/2020   HCT 41.2 06/21/2020   MCV 91.6 06/21/2020   PLT 223 06/21/2020   No results found for: IRON, TIBC, FERRITIN  Obesity Behavioral Intervention:   Approximately 15 minutes were spent on the discussion below.  ASK: We discussed the diagnosis of obesity with Edd Fabian today and Tangelia agreed to give [place] permission to discuss obesity behavioral modification therapy today.  ASSESS: Hajra has the diagnosis of obesity and her BMI today is 30.62. Yena is in the action stage of change.   ADVISE: Elesia was educated on the multiple health risks of obesity as well as the benefit of weight loss to improve her health. She was advised of the need for long term treatment and the importance of lifestyle modifications to improve her current health and to decrease her risk of future health problems.  AGREE: Multiple dietary modification options and treatment options were discussed and Steffie agreed to follow the recommendations documented in the above note.  ARRANGE: Ndeye was educated on the importance of frequent visits to treat obesity as outlined per CMS and USPSTF guidelines and agreed to schedule her next follow up appointment today.  Attestation Statements:   Reviewed by clinician on day of visit: allergies, medications, problem list, medical history, surgical history, family history, social history, and previous encounter  notes.   I, Trixie Dredge, am acting as transcriptionist for Coralie Common, MD.  I have reviewed the above documentation for accuracy and completeness, and I agree with the above. - Jinny Blossom, MD

## 2020-09-18 ENCOUNTER — Encounter: Payer: Self-pay | Admitting: Family Medicine

## 2020-09-20 ENCOUNTER — Telehealth: Payer: Self-pay | Admitting: Family Medicine

## 2020-09-20 NOTE — Chronic Care Management (AMB) (Signed)
°  Chronic Care Management   Note  09/20/2020 Name: Mary Hernandez MRN: [id number] DOB: 10-26-1950  Mary Hernandez is a 69 y.o. year old female who is a primary care patient of Leamon Arnt, MD. I reached out to [name] by phone today in response to a referral sent by Mary Hernandez's PCP, Leamon Arnt, MD.   Mary Hernandez was given information about Chronic Care Management services today including:  1. CCM service includes personalized support from designated clinical staff supervised by her physician, including individualized plan of care and coordination with other care providers 2. 24/7 contact phone numbers for assistance for urgent and routine care needs. 3. Service will only be billed when office clinical staff spend 20 minutes or more in a month to coordinate care. 4. Only one practitioner may furnish and bill the service in a calendar month. 5. The patient may stop CCM services at any time (effective at the end of the month) by phone call to the office staff.   Patient agreed to services and verbal consent obtained.   Follow up plan:   Lauretta Grill Upstream Scheduler

## 2020-09-20 NOTE — Chronic Care Management (AMB) (Signed)
  Chronic Care Management   Outreach Note  09/20/2020 Name: Mary Hernandez MRN: [id number] DOB: April 18, 1951  Referred by: Leamon Arnt, MD Reason for referral : No chief complaint on file.   An unsuccessful telephone outreach was attempted today. The patient was referred to the pharmacist for assistance with care management and care coordination.   Follow Up Plan:   Lauretta Grill Upstream Scheduler

## 2020-09-22 DIAGNOSIS — M797 Fibromyalgia: Secondary | ICD-10-CM | POA: Diagnosis not present

## 2020-09-22 DIAGNOSIS — M9904 Segmental and somatic dysfunction of sacral region: Secondary | ICD-10-CM | POA: Diagnosis not present

## 2020-09-22 DIAGNOSIS — M542 Cervicalgia: Secondary | ICD-10-CM | POA: Diagnosis not present

## 2020-09-22 DIAGNOSIS — M9901 Segmental and somatic dysfunction of cervical region: Secondary | ICD-10-CM | POA: Diagnosis not present

## 2020-09-22 DIAGNOSIS — M9905 Segmental and somatic dysfunction of pelvic region: Secondary | ICD-10-CM | POA: Diagnosis not present

## 2020-09-22 DIAGNOSIS — M9903 Segmental and somatic dysfunction of lumbar region: Secondary | ICD-10-CM | POA: Diagnosis not present

## 2020-09-22 DIAGNOSIS — M545 Low back pain, unspecified: Secondary | ICD-10-CM | POA: Diagnosis not present

## 2020-09-22 DIAGNOSIS — G4486 Cervicogenic headache: Secondary | ICD-10-CM | POA: Diagnosis not present

## 2020-09-22 DIAGNOSIS — M9906 Segmental and somatic dysfunction of lower extremity: Secondary | ICD-10-CM | POA: Diagnosis not present

## 2020-09-22 DIAGNOSIS — M9902 Segmental and somatic dysfunction of thoracic region: Secondary | ICD-10-CM | POA: Diagnosis not present

## 2020-09-22 DIAGNOSIS — M25551 Pain in right hip: Secondary | ICD-10-CM | POA: Diagnosis not present

## 2020-09-25 ENCOUNTER — Encounter: Payer: Self-pay | Admitting: Family Medicine

## 2020-09-27 ENCOUNTER — Other Ambulatory Visit: Payer: Self-pay

## 2020-09-27 ENCOUNTER — Ambulatory Visit: Payer: PPO | Admitting: Internal Medicine

## 2020-09-27 ENCOUNTER — Encounter: Payer: Self-pay | Admitting: Internal Medicine

## 2020-09-27 VITALS — BP 106/68 | HR 71 | Ht 65.0 in | Wt 187.4 lb

## 2020-09-27 DIAGNOSIS — I493 Ventricular premature depolarization: Secondary | ICD-10-CM | POA: Diagnosis not present

## 2020-09-27 DIAGNOSIS — E876 Hypokalemia: Secondary | ICD-10-CM | POA: Diagnosis not present

## 2020-09-27 NOTE — Patient Instructions (Addendum)
Medication Instructions:  Your physician has recommended you make the following change in your medication:   **  Decrease your Potassium to 52meq 1 tablet by mouth daily.  *If you need a refill on your cardiac medications before your next appointment, please call your pharmacy*   Lab Work: BMET in 3 weeks 10/19/2019 @ 1:15pm.  If you have labs (blood work) drawn today and your tests are completely normal, you will receive your results only by: Marland Kitchen MyChart Message (if you have MyChart) OR . A paper copy in the mail If you have any lab test that is abnormal or we need to change your treatment, we will call you to review the results.   Testing/Procedures: None ordered.    Follow-Up: At [facility], you and your health needs are our priority.  As part of our continuing mission to provide you with exceptional heart care, we have created designated Provider Care Teams.  These Care Teams include your primary Cardiologist (physician) and Advanced Practice Providers (APPs -  Physician Assistants and Nurse Practitioners) who all work together to provide you with the care you need, when you need it.  We recommend signing up for the patient portal called "MyChart".  Sign up information is provided on this After Visit Summary.  MyChart is used to connect with patients for Virtual Visits (Telemedicine).  Patients are able to view lab/test results, encounter notes, upcoming appointments, etc.  Non-urgent messages can be sent to your provider as well.   To learn more about what you can do with MyChart, go to [email or web address].    Your next appointment:   12 month(s)  The format for your next appointment:   In Person  Provider:   Virl Axe, MD

## 2020-09-27 NOTE — Progress Notes (Signed)
ELECTROPHYSIOLOGY Office NOTE  Patient ID: Mary Hernandez, MRN: [id number], DOB/AGE: May 06, 1951 69 y.o. Admit date: (Not on file) Date of Consult: 09/27/2020  Primary Physician: Leamon Arnt, MD Primary Cardiologist: new  ( formerly [facility]        HPI Mary Hernandez is a 69 y.o. female Seen to establish care in [place].  Previously seen at [facility] cardiology for symptomatic PACs and PVCs treated with flecainide.  Initally once daily then twice daily   The patient denies chest pain, shortness of breath, nocturnal dyspnea, orthopnea or peripheral edema.  There have been no palpitations, lightheadedness or syncope.  Questions regarding potassium supplementation. Previously on higher doses of diuretics.      DATE TEST EF   2/16 LHC 60-65% Coronary Art normal  6/17 Echo 75%   10/19 Calcium Score   0   DATE PR interval QRSduration Dose  4/14 170 90 0  7/18 224 112 100  9/19 218 130 100  11/20 206 132 75  12/21 198 118 75       Past Medical History:  Diagnosis Date  . Acquired hypothyroidism 03/01/2018   hx of goiter; s/p thyroidectomy  . Anxiety   . Atrial premature depolarization   . BPPV (benign paroxysmal positional vertigo) 03/01/2018  . Colon polyps   . Essential hypertension 03/01/2018   denies hx - on diuretic for Meniere's and beta blocker for PVCs  . Frequent PVCs 03/01/2018   Controlled on Flecainide, Acebutolol  . Gallbladder problem   . GERD (gastroesophageal reflux disease)   . Hip pain   . History of cardiac catheterization    Nuc 7/16:  normal perfusion; EF 76 // LHC 8/16:  normal coronary arteries  . History of depression   . History of dizziness   . History of echocardiogram    Echo 03/2006:  Normal LVEF  . History of stomach ulcers   . HLD (hyperlipidemia)   . Hypothyroidism   . IBS (irritable bowel syndrome)   . Infertility, female   . Insulin resistance   . Lower back pain   . Meniere's  disease   . Notalgia paresthetica   . Prediabetes   . PVC (premature ventricular contraction)   . Vertigo   . Vitamin D deficiency       Surgical History:  Past Surgical History:  Procedure Laterality Date  . BLADDER SURGERY  1997  . BREAST BIOPSY  2013  . BREAST REDUCTION SURGERY  1999  . CARDIAC CATHETERIZATION  05/2015  . Carpel Tunnel Release    . CHOLECYSTECTOMY  2011  . COLONOSCOPY  08/20/2015  . ESOPHAGOGASTRODUODENOSCOPY  01/13/2017  . [place]  . REDUCTION MAMMAPLASTY    . REFRACTIVE SURGERY    . S/P Eye Right 1967   Muscle Clipped   . THYROIDECTOMY  2012  . TONSILLECTOMY  1975  . TOTAL ABDOMINAL HYSTERECTOMY  1996    Current Outpatient Medications on File Prior to Visit  Medication Sig Dispense Refill  . acebutolol (SECTRAL) 200 MG capsule TAKE ONE CAPSULE BY MOUTH DAILY 90 capsule 2  . Calcium Citrate-Vitamin D 315-250 MG-UNIT TABS Take by mouth.    . cetirizine (ZYRTEC) 10 MG tablet Take 10 mg by mouth daily.    Marland Kitchen EVENING PRIMROSE OIL PO Take 1,500 mg by mouth 2 (two) times daily.    . flecainide (TAMBOCOR) 50 MG tablet Take 1.5 tablets (75 mg total) by mouth 2 (two)  times daily. 270 tablet 3  . fluticasone (FLONASE) 50 MCG/ACT nasal spray Place 2 sprays into both nostrils daily. 16 g 6  . Glucosamine-Chondroit-Vit C-Mn (GLUCOSAMINE 1500 COMPLEX) CAPS Take by mouth.    Marland Kitchen glycopyrrolate (ROBINUL) 1 MG tablet Take 1 tablet (1 mg total) by mouth 2 (two) times daily. 180 tablet 1  . hydrochlorothiazide (HYDRODIURIL) 25 MG tablet Take 12.5 mg by mouth every morning.    Marland Kitchen levothyroxine (SYNTHROID) 112 MCG tablet TAKE ONE TABLET BY MOUTH DAILY 90 tablet 3  . naltrexone (DEPADE) 50 MG tablet Take 50 mg by mouth daily. 1/2 tablet am for 1 week, then 1/2 am and 1/2 pm after 1 week    . omega-3 acid ethyl esters (LOVAZA) 1 g capsule Take 1 g by mouth 2 (two) times daily.    . Potassium Chloride ER 20 MEQ TBCR Take 2 tablets (40 meq) in the AM and take 1  tablet (20 meq) in the PM as by mouth as directed 270 tablet 3  . pravastatin (PRAVACHOL) 20 MG tablet Take 1 tablet (20 mg total) by mouth at bedtime. 90 tablet 3  . Probiotic Product (PROBIOTIC-10 PO) Take by mouth.    . Semaglutide,0.25 or 0.5MG /DOS, (OZEMPIC, 0.25 OR 0.5 MG/DOSE,) 2 MG/1.5ML SOPN Inject 0.5 mg into the skin 2 (two) times a week. 3 mL 0  . sertraline (ZOLOFT) 50 MG tablet Take 1 tablet (50 mg total) by mouth daily. 90 tablet 3  . TURMERIC PO Take 1,500 mg by mouth.     No current facility-administered medications on file prior to visit.    Allergies:  Allergies  Allergen Reactions  . Adhesive [Tape] Rash      ROS:  Please see the history of present illness.     All other systems reviewed and negative.   Physical Examination  BP 106/68   Pulse 71   Ht 5\' 5"  (1.651 m)   Wt 187 lb 6.4 oz (85 kg)   SpO2 94%   BMI 31.18 kg/m  Well developed and nourished in no acute distress HENT normal Neck supple with JVP-  flat   Clear Regular rate and rhythm, no murmurs or gallops Abd-soft with active BS No Clubbing cyanosis edema Skin-warm and dry A & Oriented  Grossly normal sensory and motor function  ECG sinus at 71 Intervals 20/12/42 Axis left minus 59  Labs: Cardiac Enzymes No results for input(s): CKTOTAL, CKMB, TROPONINI in the last 72 hours. CBC Lab Results  Component Value Date   WBC 6.0 06/21/2020   HGB 13.7 06/21/2020   HCT 41.2 06/21/2020   MCV 91.6 06/21/2020   PLT 223 06/21/2020   PROTIME: No results for input(s): LABPROT, INR in the last 72 hours. Chemistry No results for input(s): NA, K, CL, CO2, BUN, CREATININE, CALCIUM, PROT, BILITOT, ALKPHOS, ALT, AST, GLUCOSE in the last 168 hours.  Invalid input(s): LABALBU Lipids Lab Results  Component Value Date   CHOL 185 06/21/2020   HDL 66 06/21/2020   LDLCALC 89 06/21/2020   TRIG 205 (H) 06/21/2020    EKG: sinus 71 22/13/44   Assessment and Plan:  PVCs  Chest  pain  Hypokalemia    PVCs are well controlled  Continue flecainide. ECG does not show excessive prolongation of her intervals.  No interval chest pain.  Has had low potassium in the past in the context of her diuretics. Her diuretic dose has been decreased. We will decrease her potassium supplementation from 40/20--20 every morning check  a metabolic profile in 3 weeks      Virl Axe

## 2020-10-04 DIAGNOSIS — M9902 Segmental and somatic dysfunction of thoracic region: Secondary | ICD-10-CM | POA: Diagnosis not present

## 2020-10-04 DIAGNOSIS — M9901 Segmental and somatic dysfunction of cervical region: Secondary | ICD-10-CM | POA: Diagnosis not present

## 2020-10-04 DIAGNOSIS — G4486 Cervicogenic headache: Secondary | ICD-10-CM | POA: Diagnosis not present

## 2020-10-04 DIAGNOSIS — M545 Low back pain, unspecified: Secondary | ICD-10-CM | POA: Diagnosis not present

## 2020-10-04 DIAGNOSIS — M25551 Pain in right hip: Secondary | ICD-10-CM | POA: Diagnosis not present

## 2020-10-04 DIAGNOSIS — M9906 Segmental and somatic dysfunction of lower extremity: Secondary | ICD-10-CM | POA: Diagnosis not present

## 2020-10-04 DIAGNOSIS — M542 Cervicalgia: Secondary | ICD-10-CM | POA: Diagnosis not present

## 2020-10-04 DIAGNOSIS — M9904 Segmental and somatic dysfunction of sacral region: Secondary | ICD-10-CM | POA: Diagnosis not present

## 2020-10-04 DIAGNOSIS — M9905 Segmental and somatic dysfunction of pelvic region: Secondary | ICD-10-CM | POA: Diagnosis not present

## 2020-10-04 DIAGNOSIS — M797 Fibromyalgia: Secondary | ICD-10-CM | POA: Diagnosis not present

## 2020-10-04 DIAGNOSIS — M9903 Segmental and somatic dysfunction of lumbar region: Secondary | ICD-10-CM | POA: Diagnosis not present

## 2020-10-09 ENCOUNTER — Other Ambulatory Visit: Payer: PPO

## 2020-10-09 DIAGNOSIS — Z20822 Contact with and (suspected) exposure to covid-19: Secondary | ICD-10-CM

## 2020-10-10 LAB — SARS-COV-2, NAA 2 DAY TAT

## 2020-10-10 LAB — NOVEL CORONAVIRUS, NAA: SARS-CoV-2, NAA: NOT DETECTED

## 2020-10-12 ENCOUNTER — Other Ambulatory Visit: Payer: Self-pay

## 2020-10-12 ENCOUNTER — Ambulatory Visit (INDEPENDENT_AMBULATORY_CARE_PROVIDER_SITE_OTHER): Payer: PPO | Admitting: Family Medicine

## 2020-10-12 ENCOUNTER — Encounter (INDEPENDENT_AMBULATORY_CARE_PROVIDER_SITE_OTHER): Payer: Self-pay | Admitting: Family Medicine

## 2020-10-12 VITALS — BP 105/68 | HR 68 | Temp 97.9°F | Ht 65.0 in | Wt 183.0 lb

## 2020-10-12 DIAGNOSIS — R7303 Prediabetes: Secondary | ICD-10-CM

## 2020-10-12 DIAGNOSIS — E669 Obesity, unspecified: Secondary | ICD-10-CM

## 2020-10-12 DIAGNOSIS — Z6830 Body mass index (BMI) 30.0-30.9, adult: Secondary | ICD-10-CM | POA: Diagnosis not present

## 2020-10-12 DIAGNOSIS — E038 Other specified hypothyroidism: Secondary | ICD-10-CM | POA: Diagnosis not present

## 2020-10-12 DIAGNOSIS — E781 Pure hyperglyceridemia: Secondary | ICD-10-CM | POA: Diagnosis not present

## 2020-10-12 MED ORDER — OZEMPIC (0.25 OR 0.5 MG/DOSE) 2 MG/1.5ML ~~LOC~~ SOPN: 1.0000 mg | PEN_INJECTOR | SUBCUTANEOUS | 0 refills | Status: DC

## 2020-10-13 LAB — COMPREHENSIVE METABOLIC PANEL
ALT: 18 IU/L (ref 0–32)
AST: 17 IU/L (ref 0–40)
Albumin/Globulin Ratio: 1.6 (ref 1.2–2.2)
Albumin: 4.4 g/dL (ref 3.8–4.8)
Alkaline Phosphatase: 76 IU/L (ref 44–121)
BUN/Creatinine Ratio: 28 (ref 12–28)
BUN: 16 mg/dL (ref 8–27)
Bilirubin Total: 0.2 mg/dL (ref 0.0–1.2)
CO2: 25 mmol/L (ref 20–29)
Calcium: 8.9 mg/dL (ref 8.7–10.3)
Chloride: 104 mmol/L (ref 96–106)
Creatinine, Ser: 0.57 mg/dL (ref 0.57–1.00)
GFR calc Af Amer: 110 mL/min/{1.73_m2} (ref >59)
GFR calc non Af Amer: 96 mL/min/{1.73_m2} (ref >59)
Globulin, Total: 2.7 g/dL (ref 1.5–4.5)
Glucose: 101 mg/dL — ABNORMAL HIGH (ref 65–99)
Potassium: 3.7 mmol/L (ref 3.5–5.2)
Sodium: 144 mmol/L (ref 134–144)
Total Protein: 7.1 g/dL (ref 6.0–8.5)

## 2020-10-13 LAB — TSH: TSH: 0.557 u[IU]/mL (ref 0.450–4.500)

## 2020-10-13 LAB — INSULIN, RANDOM: INSULIN: 27.5 u[IU]/mL — ABNORMAL HIGH (ref 2.6–24.9)

## 2020-10-13 LAB — LIPID PANEL WITH LDL/HDL RATIO
Cholesterol, Total: 173 mg/dL (ref 100–199)
HDL: 57 mg/dL (ref >39)
LDL Chol Calc (NIH): 90 mg/dL (ref 0–99)
LDL/HDL Ratio: 1.6 ratio (ref 0.0–3.2)
Triglycerides: 149 mg/dL (ref 0–149)
VLDL Cholesterol Cal: 26 mg/dL (ref 5–40)

## 2020-10-13 LAB — HEMOGLOBIN A1C
Est. average glucose Bld gHb Est-mCnc: 123 mg/dL
Hgb A1c MFr Bld: 5.9 % — ABNORMAL HIGH (ref 4.8–5.6)

## 2020-10-16 ENCOUNTER — Encounter (INDEPENDENT_AMBULATORY_CARE_PROVIDER_SITE_OTHER): Payer: Self-pay | Admitting: Family Medicine

## 2020-10-16 ENCOUNTER — Other Ambulatory Visit (INDEPENDENT_AMBULATORY_CARE_PROVIDER_SITE_OTHER): Payer: Self-pay

## 2020-10-16 DIAGNOSIS — R7303 Prediabetes: Secondary | ICD-10-CM

## 2020-10-16 MED ORDER — OZEMPIC (0.25 OR 0.5 MG/DOSE) 2 MG/1.5ML ~~LOC~~ SOPN: 1.0000 mg | PEN_INJECTOR | SUBCUTANEOUS | 0 refills | Status: DC

## 2020-10-16 NOTE — Progress Notes (Signed)
Chief Complaint:   OBESITY Luke is here to discuss her progress with her obesity treatment plan along with follow-up of her obesity related diagnoses. Nyara is on the Category 2 Plan and states she is following her eating plan approximately 60-70% of the time. Gearline states she is walking 15-20 minutes 5 times per week, and pilates 60 minutes 1 time per week.  Today's visit was #: 5 Starting weight: 192 lbs Starting date: 07/03/2020 Today's weight: 183 lbs Today's date: 10/12/2020 Total lbs lost to date: 9 lbs Total lbs lost since last in-office visit: 1 lb  Interim History: Tauni had a great holiday season. She did have a small  birthday celebration. Going to [place] middle of month to help daughter out. Trying to increase physical activity but is limited due to hip pain.  Subjective:   1. Prediabetes Shawnya has a diagnosis of prediabetes based on her elevated HgA1c and was informed this puts her at greater risk of developing diabetes. She continues to work on diet and exercise to decrease her risk of diabetes. She denies nausea or hypoglycemia.   Lab Results  Component Value Date   HGBA1C 5.9 (H) 10/12/2020   Lab Results  Component Value Date   INSULIN 27.5 (H) 10/12/2020   INSULIN 21.9 07/03/2020    2. Hypertriglyceridemia Arelys has hyperlipidemia and has been trying to improve her cholesterol levels with intensive lifestyle modification including a low saturated fat diet, exercise and weight loss. She denies any chest pain, claudication or myalgias. Alexah is on statin. Last Trig was >200.  Lab Results  Component Value Date   ALT 18 10/12/2020   AST 17 10/12/2020   ALKPHOS 76 10/12/2020   BILITOT 0.2 10/12/2020   Lab Results  Component Value Date   CHOL 173 10/12/2020   HDL 57 10/12/2020   LDLCALC 90 10/12/2020   LDLDIRECT 94.0 05/01/2018   TRIG 149 10/12/2020   CHOLHDL 2.8 06/21/2020    3. Other specified hypothyroidism Leah is on Levothyroxine. Last TSH  was within normal limits.    Assessment/Plan:   1. Prediabetes We will Increase Ozempic to 1.5 mg SubQ  Weekly  #12 No refill.  - Semaglutide,0.25 or 0.5MG /DOS, (OZEMPIC, 0.25 OR 0.5 MG/DOSE,) 2 MG/1.5ML SOPN; Inject 1 mg into the skin once a week.  Dispense: 12 mL; Refill: 0 - Comprehensive metabolic panel - Hemoglobin A1c - Insulin, random  2. Hypertriglyceridemia FLP today  - Lipid Panel With LDL/HDL Ratio  3. Other specified hypothyroidism Patient with long-standing hypothyroidism, on levothyroxine therapy. She appears euthyroid. Orders and follow up as documented in patient record.  Counseling . Good thyroid control is important for overall health. [name], with water, separated by at least 30 minutes from breakfast, and separated by more than 4 hours from calcium, iron, multivitamins, acid reflux medications (PPIs).   - TSH  4. Class 1 obesity with serious comorbidity and body mass index (BMI) of 30.0 to 30.9 in adult, unspecified obesity type  Teofila is currently in the action stage of change. As such, her goal is to continue with weight loss efforts. She has agreed to the Category 2 Plan.   Exercise goals: Some exercise.  Behavioral modification strategies: increasing lean protein intake, meal planning and cooking strategies, keeping healthy foods in the home and planning for success.  Theo has agreed to follow-up with our clinic in  4 weeks. She was informed of the importance of frequent follow-up visits to maximize her success with intensive lifestyle modifications for her multiple health conditions.    Infiniti was informed we would discuss her lab results at her next visit unless there is a critical issue that needs to be addressed sooner. Ezabella agreed to keep her next visit at the agreed upon time to discuss these results.  Objective:    Blood pressure 105/68, pulse 68, temperature 97.9 F (36.6 C), temperature source Oral, height 5\' 5"  (1.651 m), weight 183 lb (83 kg), SpO2 97 %. Body mass index is 30.45 kg/m.  General: Cooperative, alert, well developed, in no acute distress. HEENT: Conjunctivae and lids unremarkable. Cardiovascular: Regular rhythm.  Lungs: Normal work of breathing. Neurologic: No focal deficits.   Lab Results  Component Value Date   CREATININE 0.57 10/12/2020   BUN 16 10/12/2020   NA 144 10/12/2020   K 3.7 10/12/2020   CL 104 10/12/2020   CO2 25 10/12/2020   Lab Results  Component Value Date   ALT 18 10/12/2020   AST 17 10/12/2020   ALKPHOS 76 10/12/2020   BILITOT 0.2 10/12/2020   Lab Results  Component Value Date   HGBA1C 5.9 (H) 10/12/2020   HGBA1C 6.2 (H) 06/21/2020   HGBA1C 5.9 (A) 10/07/2019   HGBA1C 6.2 05/17/2019   HGBA1C 5.9 10/12/2018   Lab Results  Component Value Date   INSULIN 27.5 (H) 10/12/2020   INSULIN 21.9 07/03/2020   Lab Results  Component Value Date   TSH 0.557 10/12/2020   Lab Results  Component Value Date   CHOL 173 10/12/2020   HDL 57 10/12/2020   LDLCALC 90 10/12/2020   LDLDIRECT 94.0 05/01/2018   TRIG 149 10/12/2020   CHOLHDL 2.8 06/21/2020   Lab Results  Component Value Date   WBC 6.0 06/21/2020   HGB 13.7 06/21/2020   HCT 41.2 06/21/2020   MCV 91.6 06/21/2020   PLT 223 06/21/2020   No results found for: IRON, TIBC, FERRITIN  Obesity Behavioral Intervention:   Approximately 15 minutes were spent on the discussion below.  ASK: We discussed the diagnosis of obesity with Edd Fabian today and Amazing agreed to give [place] permission to discuss obesity behavioral modification therapy today.  ASSESS: Morrisa has the diagnosis of obesity and her BMI today is 30.5. Anwen is in the action stage of change.   ADVISE: Lindsie was educated on the multiple health risks of obesity as well as the benefit of weight loss to improve her health. She was advised  of the need for long term treatment and the importance of lifestyle modifications to improve her current health and to decrease her risk of future health problems.  AGREE: Multiple dietary modification options and treatment options were discussed and Deneise agreed to follow the recommendations documented in the above note.  ARRANGE: Azaliyah was educated on the importance of frequent visits to treat obesity as outlined per CMS and USPSTF guidelines and agreed to schedule her next follow up appointment today.  Attestation Statements:   Reviewed by clinician on day of visit: allergies, medications, problem list, medical history, surgical history, family history, social history, and previous encounter notes.   I, Para March, am acting as transcriptionist for Coralie Common, MD.  I have reviewed the above documentation for accuracy and completeness, and I agree with the above. - Jinny Blossom, MD

## 2020-10-17 DIAGNOSIS — M9908 Segmental and somatic dysfunction of rib cage: Secondary | ICD-10-CM | POA: Diagnosis not present

## 2020-10-17 DIAGNOSIS — M9902 Segmental and somatic dysfunction of thoracic region: Secondary | ICD-10-CM | POA: Diagnosis not present

## 2020-10-17 DIAGNOSIS — G44209 Tension-type headache, unspecified, not intractable: Secondary | ICD-10-CM | POA: Diagnosis not present

## 2020-10-17 DIAGNOSIS — M9901 Segmental and somatic dysfunction of cervical region: Secondary | ICD-10-CM | POA: Diagnosis not present

## 2020-10-17 DIAGNOSIS — M654 Radial styloid tenosynovitis [de Quervain]: Secondary | ICD-10-CM | POA: Diagnosis not present

## 2020-10-17 DIAGNOSIS — M9907 Segmental and somatic dysfunction of upper extremity: Secondary | ICD-10-CM | POA: Diagnosis not present

## 2020-10-17 DIAGNOSIS — M5451 Vertebrogenic low back pain: Secondary | ICD-10-CM | POA: Diagnosis not present

## 2020-10-17 DIAGNOSIS — M9906 Segmental and somatic dysfunction of lower extremity: Secondary | ICD-10-CM | POA: Diagnosis not present

## 2020-10-17 DIAGNOSIS — M25512 Pain in left shoulder: Secondary | ICD-10-CM | POA: Diagnosis not present

## 2020-10-18 ENCOUNTER — Ambulatory Visit: Payer: PPO

## 2020-10-18 ENCOUNTER — Other Ambulatory Visit: Payer: Self-pay

## 2020-10-18 ENCOUNTER — Other Ambulatory Visit: Payer: PPO

## 2020-10-18 DIAGNOSIS — E876 Hypokalemia: Secondary | ICD-10-CM

## 2020-10-18 LAB — BASIC METABOLIC PANEL
BUN/Creatinine Ratio: 27 (ref 12–28)
BUN: 15 mg/dL (ref 8–27)
CO2: 24 mmol/L (ref 20–29)
Calcium: 9 mg/dL (ref 8.7–10.3)
Chloride: 100 mmol/L (ref 96–106)
Creatinine, Ser: 0.56 mg/dL — ABNORMAL LOW (ref 0.57–1.00)
GFR calc Af Amer: 111 mL/min/{1.73_m2} (ref >59)
GFR calc non Af Amer: 96 mL/min/{1.73_m2} (ref >59)
Glucose: 90 mg/dL (ref 65–99)
Potassium: 4 mmol/L (ref 3.5–5.2)
Sodium: 140 mmol/L (ref 134–144)

## 2020-10-19 ENCOUNTER — Telehealth: Payer: Self-pay | Admitting: Family Medicine

## 2020-10-19 ENCOUNTER — Other Ambulatory Visit: Payer: PPO

## 2020-10-19 DIAGNOSIS — Z20822 Contact with and (suspected) exposure to covid-19: Secondary | ICD-10-CM

## 2020-10-19 NOTE — Telephone Encounter (Signed)
Left message for patient to call back and schedule Medicare Annual Wellness Visit (AWV) either virtually OR in office.   Last AWV 10/07/19; please schedule at anytime with [facility]Nurse Health Advisor at [facility].  This should be a 45 minute visit.   

## 2020-10-21 LAB — NOVEL CORONAVIRUS, NAA: SARS-CoV-2, NAA: NOT DETECTED

## 2020-10-21 LAB — SARS-COV-2, NAA 2 DAY TAT

## 2020-10-24 ENCOUNTER — Telehealth: Payer: PPO

## 2020-11-09 ENCOUNTER — Ambulatory Visit (INDEPENDENT_AMBULATORY_CARE_PROVIDER_SITE_OTHER): Payer: PPO | Admitting: Family Medicine

## 2020-11-09 ENCOUNTER — Encounter (INDEPENDENT_AMBULATORY_CARE_PROVIDER_SITE_OTHER): Payer: Self-pay | Admitting: Family Medicine

## 2020-11-10 ENCOUNTER — Other Ambulatory Visit: Payer: Self-pay | Admitting: Sports Medicine

## 2020-11-10 ENCOUNTER — Ambulatory Visit
Admission: RE | Admit: 2020-11-10 | Discharge: 2020-11-10 | Disposition: A | Discharge status: Home / Self Care | Payer: PPO | Source: Ambulatory Visit | Attending: Sports Medicine | Admitting: Sports Medicine

## 2020-11-10 ENCOUNTER — Telehealth: Payer: Self-pay

## 2020-11-10 DIAGNOSIS — G44209 Tension-type headache, unspecified, not intractable: Secondary | ICD-10-CM | POA: Diagnosis not present

## 2020-11-10 DIAGNOSIS — M25531 Pain in right wrist: Secondary | ICD-10-CM

## 2020-11-10 DIAGNOSIS — M25511 Pain in right shoulder: Secondary | ICD-10-CM | POA: Diagnosis not present

## 2020-11-10 DIAGNOSIS — M654 Radial styloid tenosynovitis [de Quervain]: Secondary | ICD-10-CM | POA: Diagnosis not present

## 2020-11-10 DIAGNOSIS — M19031 Primary osteoarthritis, right wrist: Secondary | ICD-10-CM | POA: Diagnosis not present

## 2020-11-10 NOTE — Chronic Care Management (AMB) (Signed)
Chronic Care Management Pharmacy Assistant   Name: Mary Hernandez  MRN: [id number] DOB: [birth year]  Reason for Encounter: Medication Review/ Initial Call   Mary Hernandez,  70 y.o. , female presents for their Initial CCM visit with the clinical pharmacist via telephone due to COVID-19 Pandemic.  PCP : Leamon Arnt, MD  Allergies:   Allergies  Allergen Reactions  . Adhesive [Tape] Rash    Medications: Outpatient Encounter Medications as of 11/10/2020  Medication Sig  . acebutolol (SECTRAL) 200 MG capsule TAKE ONE CAPSULE BY MOUTH DAILY  . Calcium Citrate-Vitamin D 315-250 MG-UNIT TABS Take by mouth.  . cetirizine (ZYRTEC) 10 MG tablet Take 10 mg by mouth daily.  Marland Kitchen EVENING PRIMROSE OIL PO Take 1,500 mg by mouth 2 (two) times daily.  . flecainide (TAMBOCOR) 50 MG tablet Take 1.5 tablets (75 mg total) by mouth 2 (two) times daily.  . fluticasone (FLONASE) 50 MCG/ACT nasal spray Place 2 sprays into both nostrils daily.  . Glucosamine-Chondroit-Vit C-Mn (GLUCOSAMINE 1500 COMPLEX) CAPS Take by mouth.  Marland Kitchen glycopyrrolate (ROBINUL) 1 MG tablet Take 1 tablet (1 mg total) by mouth 2 (two) times daily.  . hydrochlorothiazide (HYDRODIURIL) 25 MG tablet Take 12.5 mg by mouth every morning.  Marland Kitchen levothyroxine (SYNTHROID) 112 MCG tablet TAKE ONE TABLET BY MOUTH DAILY  . naltrexone (DEPADE) 50 MG tablet Take 50 mg by mouth daily. 1/2 tablet am for 1 week, then 1/2 am and 1/2 pm after 1 week  . omega-3 acid ethyl esters (LOVAZA) 1 g capsule Take 1 g by mouth 2 (two) times daily.  . Potassium Chloride ER 20 MEQ TBCR Take 2 tablets (40 meq) in the AM and take 1 tablet (20 meq) in the PM as by mouth as directed  . pravastatin (PRAVACHOL) 20 MG tablet Take 1 tablet (20 mg total) by mouth at bedtime.  . pregabalin (LYRICA) 75 MG capsule Take 75 mg by mouth 2 (two) times daily.  . Probiotic Product (PROBIOTIC-10 PO) Take by mouth.  . Semaglutide,0.25 or 0.5MG /DOS, (OZEMPIC, 0.25 OR 0.5 MG/DOSE,) 2  MG/1.5ML SOPN Inject 1 mg into the skin once a week.  . sertraline (ZOLOFT) 50 MG tablet Take 1 tablet (50 mg total) by mouth daily.  . TURMERIC PO Take 1,500 mg by mouth.   No facility-administered encounter medications on file as of 11/10/2020.    Current Diagnosis: Patient Active Problem List   Diagnosis Date Noted  . Obesity, Class I, BMI 30-34.9 10/07/2019  . Stress reaction 10/07/2019  . Notalgia paresthetica 07/01/2019  . Insomnia due to medical condition 10/18/2018  . Prediabetes 10/12/2018  . History of rotator cuff syndrome with tear and adhesive capsulitis 09/11/2018  . History of lateral epicondylitis of right elbow 06/25/2018  . Diverticulosis 03/30/2018  . GERD (gastroesophageal reflux disease) 03/30/2018  . Mixed hyperlipidemia 03/01/2018  . Acquired hypothyroidism 03/01/2018  . Meniere disease 03/01/2018  . Frequent PVCs 03/01/2018    Have you seen any other providers since your last visit?   07/03/2020 [facility] Weight Management Laqueta Linden, MD, 07/17/2020 [facility] Weight Management Laqueta Linden, MD, 08/03/2020 [facility] Ophthalmology Katy Fitch, Darlina Guys, MD, 08/09/2020 [facility] Weight Management Laqueta Linden, MD, 09/13/2020 [facility] Weight Management Laqueta Linden, MD, 09/27/2020 [facility] Cardiology Deboraha Sprang, MD Frequent PVC's., 10/12/2020 [facility] Weight Management Laqueta Linden, MD.  Any changes in your medications or health?  Patient states her most recent new medications are Naltrexone and Lyrica. Patient states she has not  had any recent changes in her health.  Any side effects from any medications?   Patient states she does not currently have any side effects from any of her medications.  Do you have any symptoms or problems not managed by your medications?  Patient states she does not currently have any symptoms or problems that are not managed by her medications.  Any concerns about your health right now?  Patient states she does not have any concerns  about her health right now.  Has your provider asked that you check blood pressure, blood sugar, or follow special diet at home?  Patient states she has not been asked by her provider to check her blood pressure or blood sugars at this time. Patient states her weight management provider instructed her to eat a high protein and low carb diet.  Do you get any type of exercise on a regular basis?  Patient states she walks regularly and goes to a Pilates class once a week. Patient also states she goes swimming during the summer.  Can you think of a goal you would like to reach for your health?  Patient states she would like to maintain her health and would like to lose some weight.  Do you have any problems getting your medications?  Patient states she does not have any problems getting her medications.  Is there anything that you would like to discuss during the appointment?  Patient states she would like to discuss her medications and would like to discuss more about [facility].  Please have all of your medications and supplements available during your appointment.  April D Calhoun, [facility] Pharmacist Assistant [phone]   Follow-Up:  Pharmacist Review

## 2020-11-13 ENCOUNTER — Ambulatory Visit (INDEPENDENT_AMBULATORY_CARE_PROVIDER_SITE_OTHER): Payer: PPO

## 2020-11-13 ENCOUNTER — Other Ambulatory Visit: Payer: Self-pay

## 2020-11-13 DIAGNOSIS — Z0000 Encounter for general adult medical examination without abnormal findings: Secondary | ICD-10-CM

## 2020-11-13 NOTE — Progress Notes (Signed)
Virtual Visit via Telephone Note  I connected with  Mary Hernandez on 11/13/20 at  1:45 PM EST by telephone and verified that I am speaking with the correct person using two identifiers.  Medicare Annual Wellness visit completed telephonically due to Covid-19 pandemic.   Persons participating in this call: This Health Coach and this patient.   Location: Patient: Home Provider: Office   I discussed the limitations, risks, security and privacy concerns of performing an evaluation and management service by telephone and the availability of in person appointments. The patient expressed understanding and agreed to proceed.  Unable to perform video visit due to video visit attempted and failed and/or patient does not have video capability.   Some vital signs may be absent or patient reported.   Willette Brace, LPN    Subjective:   Mary Hernandez is a 70 y.o. female who presents for Medicare Annual (Subsequent) preventive examination.  Review of Systems     Cardiac Risk Factors include: advanced age (>84men, >19 women);dyslipidemia;obesity (BMI >30kg/m2)     Objective:    Today's Vitals   11/13/20 1349  PainSc: 8    There is no height or weight on file to calculate BMI.  Advanced Directives 11/13/2020 10/07/2019 11/15/2018  Does Patient Have a Medical Advance Directive? Yes Yes No  Type of Advance Directive Healthcare Power of [place] -  Does patient want to make changes to medical advance directive? - No - Patient declined -  Copy of [place] in Chart? Yes - validated most recent copy scanned in chart (See row information) Yes - validated most recent copy scanned in chart (See row information) -  Would patient like information on creating a medical advance directive? - - No - Patient declined    Current Medications (verified) Outpatient Encounter Medications as of 11/13/2020  Medication Sig  . acebutolol  (SECTRAL) 200 MG capsule TAKE ONE CAPSULE BY MOUTH DAILY  . Calcium Citrate-Vitamin D 315-250 MG-UNIT TABS Take by mouth.  . cetirizine (ZYRTEC) 10 MG tablet Take 10 mg by mouth daily.  . diclofenac Sodium (VOLTAREN) 1 % GEL Apply topically 4 (four) times daily.  Marland Kitchen EVENING PRIMROSE OIL PO Take 1,500 mg by mouth 2 (two) times daily.  . flecainide (TAMBOCOR) 50 MG tablet Take 1.5 tablets (75 mg total) by mouth 2 (two) times daily.  . fluticasone (FLONASE) 50 MCG/ACT nasal spray Place 2 sprays into both nostrils daily.  . Glucosamine-Chondroit-Vit C-Mn (GLUCOSAMINE 1500 COMPLEX) CAPS Take by mouth.  Marland Kitchen glycopyrrolate (ROBINUL) 1 MG tablet Take 1 tablet (1 mg total) by mouth 2 (two) times daily.  . hydrochlorothiazide (HYDRODIURIL) 25 MG tablet Take 12.5 mg by mouth every morning.  . Hypromellose (ISOPTO TEARS) 0.5 % SOLN Apply to eye.  . levothyroxine (SYNTHROID) 112 MCG tablet TAKE ONE TABLET BY MOUTH DAILY  . naltrexone (DEPADE) 50 MG tablet Take 50 mg by mouth daily. 1/2 tablet am for 1 week, then 1/2 am and 1/2 pm after 1 week  . omega-3 acid ethyl esters (LOVAZA) 1 g capsule Take 1 g by mouth 2 (two) times daily.  . ondansetron (ZOFRAN-ODT) 4 MG disintegrating tablet Take 4 mg by mouth every 8 (eight) hours as needed for nausea or vomiting.  . Potassium Chloride ER 20 MEQ TBCR Take 2 tablets (40 meq) in the AM and take 1 tablet (20 meq) in the PM as by mouth as directed  . pravastatin (PRAVACHOL) 20 MG  tablet Take 1 tablet (20 mg total) by mouth at bedtime.  . pregabalin (LYRICA) 75 MG capsule Take 75 mg by mouth 2 (two) times daily.  . Probiotic Product (PROBIOTIC-10 PO) Take by mouth.  . Semaglutide,0.25 or 0.5MG /DOS, (OZEMPIC, 0.25 OR 0.5 MG/DOSE,) 2 MG/1.5ML SOPN Inject 1 mg into the skin once a week.  . sertraline (ZOLOFT) 50 MG tablet Take 1 tablet (50 mg total) by mouth daily.  Marland Kitchen TART CHERRY PO Take by mouth.  . Turmeric 400 MG CAPS Take by mouth.  . [DISCONTINUED] TURMERIC PO Take  1,500 mg by mouth.   No facility-administered encounter medications on file as of 11/13/2020.    Allergies (verified) Seasonal ic [cholestatin] and Adhesive [tape]   History: Past Medical History:  Diagnosis Date  . Acquired hypothyroidism 03/01/2018   hx of goiter; s/p thyroidectomy  . Anxiety   . Atrial premature depolarization   . BPPV (benign paroxysmal positional vertigo) 03/01/2018  . Colon polyps   . Essential hypertension 03/01/2018   denies hx - on diuretic for Meniere's and beta blocker for PVCs  . Frequent PVCs 03/01/2018   Controlled on Flecainide, Acebutolol  . Gallbladder problem   . GERD (gastroesophageal reflux disease)   . Hip pain   . History of cardiac catheterization    Nuc 7/16:  normal perfusion; EF 76 // LHC 8/16:  normal coronary arteries  . History of depression   . History of dizziness   . History of echocardiogram    Echo 03/2006:  Normal LVEF  . History of stomach ulcers   . HLD (hyperlipidemia)   . Hypothyroidism   . IBS (irritable bowel syndrome)   . Infertility, female   . Insulin resistance   . Lower back pain   . Meniere's disease   . Notalgia paresthetica   . Prediabetes   . PVC (premature ventricular contraction)   . Vertigo   . Vitamin D deficiency    Past Surgical History:  Procedure Laterality Date  . BLADDER SURGERY  1997  . BREAST BIOPSY  2013  . BREAST REDUCTION SURGERY  1999  . CARDIAC CATHETERIZATION  05/2015  . Carpel Tunnel Release    . CHOLECYSTECTOMY  2011  . COLONOSCOPY  08/20/2015  . ESOPHAGOGASTRODUODENOSCOPY  01/13/2017  . [place]  . REDUCTION MAMMAPLASTY    . REFRACTIVE SURGERY    . S/P Eye Right 1967   Muscle Clipped   . THYROIDECTOMY  2012  . TONSILLECTOMY  1975  . TOTAL ABDOMINAL HYSTERECTOMY  1996   Family History  Problem Relation Age of Onset  . Breast cancer Mother   . Hyperlipidemia Mother   . Thyroid disease Mother   . Cancer Mother   . Heart attack Father   . Pulmonary  fibrosis Father   . Peripheral Artery Disease Father        s/p CEA  . Heart disease Father   . Hyperlipidemia Father   . Early death Sister   . Depression Brother   . Hearing loss Brother   . Diabetes Daughter   . Heart disease Paternal Grandmother   . Colon cancer Neg Hx   . Stomach cancer Neg Hx    Social History   Socioeconomic History  . Marital status: Married    Spouse name: Not on file  . Number of children: 2  . Years of education: Not on file  . Highest education level: Not on file  Occupational History  . Occupation: Retired  elementary music teacher  Tobacco Use  . Smoking status: Never Smoker  . Smokeless tobacco: Never Used  Vaping Use  . Vaping Use: Never used  Substance and Sexual Activity  . Alcohol use: Yes    Comment: very limited   . Drug use: Never  . Sexual activity: Not on file  Other Topics Concern  . Not on file  Social History Narrative   Retired Licensed conveyancer   Born [place] in [place] for [place] years   Moved to [place] in 2019   Twin daughters (one daughter is diabetic Tourist information centre manager)    Social Determinants of Radio broadcast assistant Strain: [place]   . Difficulty of Paying Living Expenses: Not hard at all  Food Insecurity: No Food Insecurity  . Worried About Charity fundraiser in the Last Year: Never true  . Ran Out of Food in the Last Year: Never true  Transportation Needs: No Transportation Needs  . Lack of Transportation (Medical): No  . Lack of Transportation (Non-Medical): No  Physical Activity: Insufficiently Active  . Days of Exercise per Week: 3 days  . Minutes of Exercise per Session: 20 min  Stress: No Stress Concern Present  . Feeling of Stress : Not at all  Social Connections: Moderately Integrated  . Frequency of Communication with Friends and Family: More than three times a week  . Frequency of Social Gatherings with Friends and Family: Three times a week  . Attends  Religious Services: More than 4 times per year  . Active Member of Clubs or Organizations: No  . Attends Archivist Meetings: Never  . Marital Status: Married    Tobacco Counseling Counseling given: Not Answered   Clinical Intake:  Pre-visit preparation completed: Yes  Pain : 0-10 Pain Score: 8  Pain Type: Chronic pain Pain Location: Arm Pain Orientation: Right Pain Descriptors / Indicators: Aching,Shooting Pain Onset: More than a month ago Pain Frequency: Intermittent     BMI - recorded: 30.45 Nutritional Status: BMI > 30  Obese Nutritional Risks: None Diabetes: No  How often do you need to have someone help you when you read instructions, pamphlets, or other written materials from your doctor or pharmacy?: 1 - Never  Diabetic?Pre diabetes  Interpreter Needed?: No  Information entered by :: Charlott Rakes, LPN   Activities of Daily Living In your present state of health, do you have any difficulty performing the following activities: 11/13/2020  Hearing? N  Vision? N  Difficulty concentrating or making decisions? N  Walking or climbing stairs? N  Dressing or bathing? N  Doing errands, shopping? N  Preparing Food and eating ? N  Using the Toilet? N  In the past six months, have you accidently leaked urine? N  Do you have problems with loss of bowel control? N  Managing your Medications? N  Managing your Finances? N  Housekeeping or managing your Housekeeping? N  Some recent data might be hidden    Patient Care Team: Leamon Arnt, MD as PCP - General (Family Medicine) Deboraha Sprang, MD as PCP - Electrophysiology (Cardiology) Gerda Diss, DO as Consulting Physician (Sports Medicine) Debbra Riding, MD as Consulting Physician (Ophthalmology) Deboraha Sprang, MD as Consulting Physician (Cardiology) Magnus Sinning, MD as Consulting Physician (Physical Medicine and Rehabilitation) Madelin Rear, [facility] (Pharmacist) Madelin Rear, [facility] as  Pharmacist (Pharmacist)  Indicate any recent Medical Services you may have received  from other than [facility] providers in the past year (date may be approximate).     Assessment:   This is a routine wellness examination for [facility].  Hearing/Vision screen  Hearing Screening   125Hz  250Hz  500Hz  1000Hz  2000Hz  3000Hz  4000Hz  6000Hz  8000Hz   Right ear:           Left ear:           Comments: Pt denies any hearing issues  Vision Screening Comments: Dr Wyatt Portela for annual eye exams  Dietary issues and exercise activities discussed: Current Exercise Habits: Home exercise routine, Type of exercise: stretching;walking;Other - see comments (pilates), Time (Minutes): 50, Frequency (Times/Week): 3, Weekly Exercise (Minutes/Week): 150  Goals    . Patient Stated     Losing weight       Depression Screen PHQ 2/9 Scores 11/13/2020 07/03/2020 10/07/2019 05/19/2019 10/12/2018 02/25/2018  PHQ - 2 Score 0 3 0 0 0 0  PHQ- 9 Score - 9 - 3 - -    Fall Risk Fall Risk  11/13/2020 10/07/2019 12/30/2018 12/30/2018 02/25/2018  Falls in the past year? 1 0 0 0 No  Number falls in past yr: 1 0 0 0 -  Injury with Fall? 1 - 0 0 -  Comment bruised - - - -    FALL RISK PREVENTION PERTAINING TO THE HOME:  Any stairs in or around the home? Yes  If so, are there any without handrails? No  Home free of loose throw rugs in walkways, pet beds, electrical cords, etc? Yes  Adequate lighting in your home to reduce risk of falls? Yes   ASSISTIVE DEVICES UTILIZED TO PREVENT FALLS:  Life alert? No  Use of a cane, walker or w/c? No  Grab bars in the bathroom? Yes  Shower chair or bench in shower? Yes  Elevated toilet seat or a handicapped toilet? Yes   TIMED UP AND GO:  Was the test performed? No .     Cognitive Function:     6CIT Screen 11/13/2020  What Year? 0 points  What month? 0 points  Count back from 20 0 points  Months in reverse 0 points  Repeat phrase 0 points    Immunizations Immunization History   Administered Date(s) Administered  . Fluad Quad(high Dose 65+) 06/08/2019, 06/21/2020  . Hepatitis A 06/18/2004  . Hepatitis B 06/18/2004  . Influenza, High Dose Seasonal PF 06/16/2018  . PFIZER(Purple Top)SARS-COV-2 Vaccination 11/14/2019, 12/09/2019, 07/07/2020  . Pneumococcal Conjugate-13 09/14/2015  . Pneumococcal Polysaccharide-23 06/10/2017  . Tdap 06/18/2017  . Zoster 08/22/2010  . Zoster Recombinat (Shingrix) 06/18/2017, 11/06/2017    TDAP status: Up to date  Flu Vaccine status: Up to date done 06/21/20  Pneumococcal vaccine status: Up to date  Covid-19 vaccine status: Completed vaccines  Qualifies for Shingles Vaccine? Yes   Zostavax completed Yes   Shingrix Completed?: Yes  Screening Tests Health Maintenance  Topic Date Due  . COVID-19 Vaccine (4 - Booster for Pfizer series) 01/05/2021  . MAMMOGRAM  06/05/2021  . PNA vac Low Risk Adult (2 of 2 - PPSV23) 06/10/2022  . DEXA SCAN  05/29/2023  . COLONOSCOPY (Pts 45-42yrs Insurance coverage will need to be confirmed)  08/11/2023  . TETANUS/TDAP  06/19/2027  . INFLUENZA VACCINE  Completed  . Hepatitis C Screening  Completed    Health Maintenance  There are no preventive care reminders to display for this patient.  Colorectal cancer screening: Type of screening: Colonoscopy. Completed 08/10/18. Repeat every 5 years  Mammogram  status: Completed 06/05/20. Repeat every year  Bone Density status: Completed 05/28/18. Results reflect: Bone density results: NORMAL. Repeat every 5 years.   Additional Screening:  Hepatitis C Screening: Completed 05/01/18  Vision Screening: Recommended annual ophthalmology exams for early detection of glaucoma and other disorders of the eye. Is the patient up to date with their annual eye exam?  Yes  Who is the provider or what is the name of the office in which the patient attends annual eye exams? Dr Katy Fitch   Dental Screening: Recommended annual dental exams for proper oral  hygiene  Community Resource Referral / Chronic Care Management: CRR required this visit?  No   CCM required this visit?  No      Plan:     I have personally reviewed and noted the following in the patient's chart:   . Medical and social history . Use of alcohol, tobacco or illicit drugs  . Current medications and supplements . Functional ability and status . Nutritional status . Physical activity . Advanced directives . List of other physicians . Hospitalizations, surgeries, and ER visits in previous 12 months . Vitals . Screenings to include cognitive, depression, and falls . Referrals and appointments  In addition, I have reviewed and discussed with patient certain preventive protocols, quality metrics, and best practice recommendations. A written personalized care plan for preventive services as well as general preventive health recommendations were provided to patient.     Willette Brace, LPN   [id number]   Nurse Notes: None

## 2020-11-13 NOTE — Patient Instructions (Addendum)
Mary Hernandez , Thank you for taking time to come for your Medicare Wellness Visit. I appreciate your ongoing commitment to your health goals. Please review the following plan we discussed and let me know if I can assist you in the future.   Screening recommendations/referrals: Colonoscopy: Done 08/10/18 Mammogram: Done 06/05/20 Bone Density: Done 05/28/18 Recommended yearly ophthalmology/optometry visit for glaucoma screening and checkup Recommended yearly dental visit for hygiene and checkup  Vaccinations: Influenza vaccine: Done 06/21/20 Up to date Pneumococcal vaccine: Up to date Tdap vaccine: Up to date Shingles vaccine: Completed 06/18/17 & 11/06/17   Covid-19:Completed 2/7, 3/4, & 07/07/20  Advanced directives: Copies in chart  Conditions/risks identified: Lose weight   Next appointment: Follow up in one year for your annual wellness visit    Preventive Care 69 Years and Older, Female Preventive care refers to lifestyle choices and visits with your health care provider that can promote health and wellness. What does preventive care include?  A yearly physical exam. This is also called an annual well check.  Dental exams once or twice a year.  Routine eye exams. Ask your health care provider how often you should have your eyes checked.  Personal lifestyle choices, including:  Daily care of your teeth and gums.  Regular physical activity.  Eating a healthy diet.  Avoiding tobacco and drug use.  Limiting alcohol use.  Practicing safe sex.  Taking low-dose aspirin every day.  Taking vitamin and mineral supplements as recommended by your health care provider. What happens during an annual well check? The services and screenings done by your health care provider during your annual well check will depend on your age, overall health, lifestyle risk factors, and family history of disease. Counseling  Your health care provider may ask you questions about your:  Alcohol  use.  Tobacco use.  Drug use.  Emotional well-being.  Home and relationship well-being.  Sexual activity.  Eating habits.  History of falls.  Memory and ability to understand (cognition).  Work and work Statistician.  Reproductive health. Screening  You may have the following tests or measurements:  Height, weight, and BMI.  Blood pressure.  Lipid and cholesterol levels. These may be checked every 5 years, or more frequently if you are over 69 years old.  Skin check.  Lung cancer screening. You may have this screening every year starting at age 69 if you have a 30-pack-year history of smoking and currently smoke or have quit within the past 15 years.  Fecal occult blood test (FOBT) of the stool. You may have this test every year starting at age 69.  Flexible sigmoidoscopy or colonoscopy. You may have a sigmoidoscopy every 5 years or a colonoscopy every 10 years starting at age 96.  Hepatitis C blood test.  Hepatitis B blood test.  Sexually transmitted disease (STD) testing.  Diabetes screening. This is done by checking your blood sugar (glucose) after you have not eaten for a while (fasting). You may have this done every 1-3 years.  Bone density scan. This is done to screen for osteoporosis. You may have this done starting at age 69.  Mammogram. This may be done every 1-2 years. Talk to your health care provider about how often you should have regular mammograms. Talk with your health care provider about your test results, treatment options, and if necessary, the need for more tests. Vaccines  Your health care provider may recommend certain vaccines, such as:  Influenza vaccine. This is recommended every year.  Tetanus,  diphtheria, and acellular pertussis (Tdap, Td) vaccine. You may need a Td booster every 10 years.  Zoster vaccine. You may need this after age 37.  Pneumococcal 13-valent conjugate (PCV13) vaccine. One dose is recommended after age  69.  Pneumococcal polysaccharide (PPSV23) vaccine. One dose is recommended after age 69. Talk to your health care provider about which screenings and vaccines you need and how often you need them. This information is not intended to replace advice given to you by your health care provider. Make sure you discuss any questions you have with your health care provider. Document Released: 10/20/2015 Document Revised: 06/12/2016 Document Reviewed: 07/25/2015 Elsevier Interactive Patient Education  2017 [place] Prevention in the Home Falls can cause injuries. They can happen to people of all ages. There are many things you can do to make your home safe and to help prevent falls. What can I do on the outside of my home?  Regularly fix the edges of walkways and driveways and fix any cracks.  Remove anything that might make you trip as you walk through a door, such as a raised step or threshold.  Trim any bushes or trees on the path to your home.  Use bright outdoor lighting.  Clear any walking paths of anything that might make someone trip, such as rocks or tools.  Regularly check to see if handrails are loose or broken. Make sure that both sides of any steps have handrails.  Any raised decks and porches should have guardrails on the edges.  Have any leaves, snow, or ice cleared regularly.  Use sand or salt on walking paths during winter.  Clean up any spills in your garage right away. This includes oil or grease spills. What can I do in the bathroom?  Use night lights.  Install grab bars by the toilet and in the tub and shower. Do not use towel bars as grab bars.  Use non-skid mats or decals in the tub or shower.  If you need to sit down in the shower, use a plastic, non-slip stool.  Keep the floor dry. Clean up any water that spills on the floor as soon as it happens.  Remove soap buildup in the tub or shower regularly.  Attach bath mats securely with double-sided  non-slip rug tape.  Do not have throw rugs and other things on the floor that can make you trip. What can I do in the bedroom?  Use night lights.  Make sure that you have a light by your bed that is easy to reach.  Do not use any sheets or blankets that are too big for your bed. They should not hang down onto the floor.  Have a firm chair that has side arms. You can use this for support while you get dressed.  Do not have throw rugs and other things on the floor that can make you trip. What can I do in the kitchen?  Clean up any spills right away.  Avoid walking on wet floors.  Keep items that you use a lot in easy-to-reach places.  If you need to reach something above you, use a strong step stool that has a grab bar.  Keep electrical cords out of the way.  Do not use floor polish or wax that makes floors slippery. If you must use wax, use non-skid floor wax.  Do not have throw rugs and other things on the floor that can make you trip. What can I do with  my stairs?  Do not leave any items on the stairs.  Make sure that there are handrails on both sides of the stairs and use them. Fix handrails that are broken or loose. Make sure that handrails are as long as the stairways.  Check any carpeting to make sure that it is firmly attached to the stairs. Fix any carpet that is loose or worn.  Avoid having throw rugs at the top or bottom of the stairs. If you do have throw rugs, attach them to the floor with carpet tape.  Make sure that you have a light switch at the top of the stairs and the bottom of the stairs. If you do not have them, ask someone to add them for you. What else can I do to help prevent falls?  Wear shoes that:  Do not have high heels.  Have rubber bottoms.  Are comfortable and fit you well.  Are closed at the toe. Do not wear sandals.  If you use a stepladder:  Make sure that it is fully opened. Do not climb a closed stepladder.  Make sure that both  sides of the stepladder are locked into place.  Ask someone to hold it for you, if possible.  Clearly mark and make sure that you can see:  Any grab bars or handrails.  First and last steps.  Where the edge of each step is.  Use tools that help you move around (mobility aids) if they are needed. These include:  Canes.  Walkers.  Scooters.  Crutches.  Turn on the lights when you go into a dark area. Replace any light bulbs as soon as they burn out.  Set up your furniture so you have a clear path. Avoid moving your furniture around.  If any of your floors are uneven, fix them.  If there are any pets around you, be aware of where they are.  Review your medicines with your doctor. Some medicines can make you feel dizzy. This can increase your chance of falling. Ask your doctor what other things that you can do to help prevent falls. This information is not intended to replace advice given to you by your health care provider. Make sure you discuss any questions you have with your health care provider. Document Released: 07/20/2009 Document Revised: 02/29/2016 Document Reviewed: 10/28/2014 Elsevier Interactive Patient Education  2017 [employer].

## 2020-11-14 ENCOUNTER — Ambulatory Visit (INDEPENDENT_AMBULATORY_CARE_PROVIDER_SITE_OTHER): Payer: PPO

## 2020-11-14 DIAGNOSIS — E039 Hypothyroidism, unspecified: Secondary | ICD-10-CM

## 2020-11-14 DIAGNOSIS — R7303 Prediabetes: Secondary | ICD-10-CM

## 2020-11-14 DIAGNOSIS — F4323 Adjustment disorder with mixed anxiety and depressed mood: Secondary | ICD-10-CM

## 2020-11-14 DIAGNOSIS — E782 Mixed hyperlipidemia: Secondary | ICD-10-CM

## 2020-11-14 NOTE — Progress Notes (Signed)
Chronic Care Management Pharmacy Note  11/16/2020 Name:  Mary Hernandez MRN:  [id number] DOB:  [birth year]  Subjective: Mary Hernandez is an 70 y.o. year old female who is a primary patient of Leamon Arnt, MD.  The CCM team was consulted for assistance with disease management and care coordination needs.    Engaged with patient by telephone for initial visit in response to provider referral for pharmacy case management and/or care coordination services.   Consent to Services:  The patient was given the following information about Chronic Care Management services today, agreed to services, and gave verbal consent: 1. CCM service includes personalized support from designated clinical staff supervised by the primary care provider, including individualized plan of care and coordination with other care providers 2. 24/7 contact phone numbers for assistance for urgent and routine care needs. 3. Service will only be billed when office clinical staff spend 20 minutes or more in a month to coordinate care. 4. Only one practitioner may furnish and bill the service in a calendar month. 5.The patient may stop CCM services at any time (effective at the end of the month) by phone call to the office staff. 6. The patient will be responsible for cost sharing (co-pay) of up to 20% of the service fee (after annual deductible is met). Patient agreed to services and consent obtained.  Patient Care Team: Leamon Arnt, MD as PCP - General (Family Medicine) Deboraha Sprang, MD as PCP - Electrophysiology (Cardiology) Gerda Diss, DO as Consulting Physician (Sports Medicine) Debbra Riding, MD as Consulting Physician (Ophthalmology) Deboraha Sprang, MD as Consulting Physician (Cardiology) Magnus Sinning, MD as Consulting Physician (Physical Medicine and Rehabilitation) Madelin Rear, [facility] (Pharmacist) Madelin Rear, [facility] as Pharmacist (Pharmacist)  Objective:  Lab Results  Component Value  Date   CREATININE 0.56 (L) 10/18/2020   BUN 15 10/18/2020   GFR 84.79 10/07/2019   GFRNONAA 96 10/18/2020   GFRAA 111 10/18/2020   NA 140 10/18/2020   K 4.0 10/18/2020   CALCIUM 9.0 10/18/2020   CO2 24 10/18/2020    Lab Results  Component Value Date/Time   HGBA1C 5.9 (H) 10/12/2020 02:12 PM   HGBA1C 6.2 (H) 06/21/2020 10:12 AM   HGBA1C 5.7 11/27/2017 12:00 AM   GFR 84.79 10/07/2019 09:57 AM   GFR 97.86 05/17/2019 08:32 AM   MICROALBUR <0.7 05/01/2018 09:55 AM    Last diabetic Eye exam:  Lab Results  Component Value Date/Time   HMDIABEYEEXA No Retinopathy 03/27/2018 12:00 AM    Last diabetic Foot exam: No results found for: HMDIABFOOTEX   Lab Results  Component Value Date   CHOL 173 10/12/2020   HDL 57 10/12/2020   LDLCALC 90 10/12/2020   LDLDIRECT 94.0 05/01/2018   TRIG 149 10/12/2020   CHOLHDL 2.8 06/21/2020    Hepatic Function Latest Ref Rng & Units 10/12/2020 07/03/2020 06/21/2020  Total Protein 6.0 - 8.5 g/dL 7.1 7.4 7.0  Albumin 3.8 - 4.8 g/dL 4.4 4.9(H) -  AST 0 - 40 IU/L '17 18 17  ' ALT 0 - 32 IU/L '18 20 26  ' Alk Phosphatase 44 - 121 IU/L 76 73 -  Total Bilirubin 0.0 - 1.2 mg/dL 0.2 0.4 0.5    Lab Results  Component Value Date/Time   TSH 0.557 10/12/2020 02:12 PM   TSH 0.51 06/21/2020 10:12 AM   FREET4 1.11 05/19/2019 04:33 PM   FREET4 0.98 10/12/2018 09:55 AM    CBC Latest Ref Rng & Units 06/21/2020  10/07/2019 05/17/2019  WBC 3.8 - 10.8 Thousand/uL 6.0 7.3 6.2  Hemoglobin 11.7 - 15.5 g/dL 13.7 13.4 13.2  Hematocrit 35.0 - 45.0 % 41.2 40.7 39.3  Platelets 140 - 400 Thousand/uL 223 250.0 213.0    Lab Results  Component Value Date/Time   VD25OH 52.3 07/03/2020 12:22 PM    Clinical ASCVD: No  The 10-year ASCVD risk score Mikey Bussing DC Jr., et al., 2013) is: 11.2%   Values used to calculate the score:     Age: 3 years     Sex: Female     Is Non-Hispanic African American: No     Diabetic: Yes     Tobacco smoker: No     Systolic Blood Pressure: 109  mmHg     Is BP treated: Yes     HDL Cholesterol: 57 mg/dL     Total Cholesterol: 173 mg/dL    Depression screen [facility] 2/9 11/13/2020 07/03/2020 10/07/2019  Decreased Interest 0 2 0  Down, Depressed, Hopeless 0 1 0  PHQ - 2 Score 0 3 0  Altered sleeping - 0 -  Tired, decreased energy - 2 -  Change in appetite - 2 -  Feeling bad or failure about yourself  - 2 -  Trouble concentrating - 0 -  Moving slowly or fidgety/restless - 0 -  Suicidal thoughts - 0 -  PHQ-9 Score - 9 -  Difficult doing work/chores - Not difficult at all -    Social History   Tobacco Use  Smoking Status Never Smoker  Smokeless Tobacco Never Used   BP Readings from Last 3 Encounters:  11/15/20 (!) 100/49  10/12/20 105/68  09/27/20 106/68   Pulse Readings from Last 3 Encounters:  11/15/20 68  10/12/20 68  09/27/20 71   Wt Readings from Last 3 Encounters:  11/15/20 180 lb (81.6 kg)  10/12/20 183 lb (83 kg)  09/27/20 187 lb 6.4 oz (85 kg)    Assessment/Interventions: Review of patient past medical history, allergies, medications, health status, including review of consultants reports, laboratory and other test data, was performed as part of comprehensive evaluation and provision of chronic care management services.   SDOH:  (Social Determinants of Health) assessments and interventions performed: Yes   CCM Care Plan  Allergies  Allergen Reactions  . Seasonal Ic [Cholestatin]   . Adhesive [Tape] Rash    Medications Reviewed Today    Reviewed by [name], DO (Physician) on 11/16/20 at [phone]  Med List Status: <None>  Medication Order Taking? Sig Documenting Provider Last Dose Status Informant  acebutolol (SECTRAL) 200 MG capsule [id number] Yes TAKE ONE CAPSULE BY MOUTH DAILY Deboraha Sprang, MD Taking Active   Calcium Citrate-Vitamin D 315-250 MG-UNIT TABS [id number] Yes Take by mouth. [provider] Taking Active   cetirizine (ZYRTEC) 10 MG tablet [id number] Yes Take 10 mg by mouth daily.  [provider] Taking Active   diclofenac Sodium (VOLTAREN) 1 % GEL [id number] Yes Apply topically 4 (four) times daily. [provider] Taking Active   EVENING PRIMROSE OIL PO [id number] Yes Take 1,500 mg by mouth 2 (two) times daily. [provider] Taking Active   flecainide (TAMBOCOR) 50 MG tablet [id number] Yes Take 1.5 tablets (75 mg total) by mouth 2 (two) times daily. Deboraha Sprang, MD Taking Active   fluticasone [facility]) 50 MCG/ACT nasal spray [id number] Yes Place 2 sprays into both nostrils daily. Leamon Arnt, MD Taking Active   Glucosamine-Chondroit-Vit C-Mn (GLUCOSAMINE 1500 COMPLEX) CAPS [id number] Yes  Take by mouth. [provider] Taking Active   glycopyrrolate (ROBINUL) 1 MG tablet [id number] Yes Take 1 tablet (1 mg total) by mouth 2 (two) times daily. Leamon Arnt, MD Taking Active   hydrochlorothiazide (HYDRODIURIL) 25 MG tablet [id number] Yes Take 12.5 mg by mouth every morning. [provider] Taking Active   levothyroxine (SYNTHROID) 112 MCG tablet [id number] Yes TAKE ONE TABLET BY MOUTH DAILY Leamon Arnt, MD Taking Active   naltrexone (DEPADE) 50 MG tablet [id number] Yes Take 50 mg by mouth daily. 1/2 tablet am for 1 week, then 1/2 am and 1/2 pm after 1 week [provider] Taking Active   omega-3 acid ethyl esters (LOVAZA) 1 g capsule [id number] Yes Take 1 g by mouth 2 (two) times daily. [provider] Taking Active   ondansetron (ZOFRAN-ODT) 4 MG disintegrating tablet [id number] Yes Take 4 mg by mouth every 8 (eight) hours as needed for nausea or vomiting. [provider] Taking Active   Potassium Chloride ER 20 MEQ TBCR [id number] Yes Take 2 tablets (40 meq) in the AM and take 1 tablet (20 meq) in the PM as by mouth as directed  Patient taking differently: Take 20 mEq by mouth daily. Take 2 tablets (40 meq) in the AM and take 1 tablet (20 meq) in the PM as by mouth as directed   Shirley Friar,  PA-C Taking Active   pravastatin (PRAVACHOL) 20 MG tablet [id number] Yes Take 1 tablet (20 mg total) by mouth at bedtime. Leamon Arnt, MD Taking Active   pregabalin (LYRICA) 75 MG capsule [id number] Yes Take 75 mg by mouth 2 (two) times daily. [provider] Taking Active   Probiotic Product (PROBIOTIC-10 PO) [id number] Yes Take by mouth. [provider] Taking Active   Semaglutide,0.25 or 0.5MG/DOS, (OZEMPIC, 0.25 OR 0.5 MG/DOSE,) 2 MG/1.5ML SOPN [id number] Yes Inject 1 mg into the skin once a week. Laqueta Linden, MD Taking Active            Med Note Lonie Peak Nov 13, 2020  1:54 PM) 1.5 mg  sertraline (ZOLOFT) 50 MG tablet [id number] Yes Take 1 tablet (50 mg total) by mouth daily. Leamon Arnt, MD Taking Active   TART CHERRY PO [id number] Yes Take by mouth. [provider] Taking Active   Turmeric 400 MG CAPS [id number] Yes Take by mouth. [provider] Taking Active           Patient Active Problem List   Diagnosis Date Noted  . Obesity, Class I, BMI 30-34.9 10/07/2019  . Stress reaction 10/07/2019  . Notalgia paresthetica 07/01/2019  . Insomnia due to medical condition 10/18/2018  . Prediabetes 10/12/2018  . History of rotator cuff syndrome with tear and adhesive capsulitis 09/11/2018  . History of lateral epicondylitis of right elbow 06/25/2018  . Diverticulosis 03/30/2018  . GERD (gastroesophageal reflux disease) 03/30/2018  . Mixed hyperlipidemia 03/01/2018  . Acquired hypothyroidism 03/01/2018  . Meniere disease 03/01/2018  . Frequent PVCs 03/01/2018   Immunization History  Administered Date(s) Administered  . Fluad Quad(high Dose 65+) 06/08/2019, 06/21/2020  . Hepatitis A 06/18/2004  . Hepatitis B 06/18/2004  . Influenza, High Dose Seasonal PF 06/16/2018  . PFIZER(Purple Top)SARS-COV-2 Vaccination 11/14/2019, 12/09/2019, 07/07/2020  . Pneumococcal Conjugate-13 09/14/2015  . Pneumococcal Polysaccharide-23  06/10/2017  . Tdap 06/18/2017  . Zoster 08/22/2010  . Zoster Recombinat (Shingrix) 06/18/2017, 11/06/2017   Conditions to be addressed/monitored:  Hyperlipidemia and Hypothyroidism  Care Plan :  CCM Pharmacy Care Plan  Updates made by Madelin Rear, [facility] since 11/16/2020 12:00 AM    Problem: HLD Hypothyroidism Pre-DM     Long-Range Goal: Disease Management   Start Date: 11/14/2020  Expected End Date: 11/14/2021  This Visit's Progress: On track  Priority: High  Note:   Medication Assistance: Cost review on OTC and Rx medications through [facility]  Patient's preferred pharmacy is:  [name] [address] [place] [address] Phone: [phone] Fax: [phone]  We discussed: Benefits of medication synchronization, packaging and delivery as well as enhanced pharmacist oversight with [facility]. Patient decided to: Perform OTC and Rx Cost Review.  Follow Up:  Patient agrees to Care Plan and Follow-up.  Plan: Telephone follow up appointment with care management team member scheduled for:  6 months and CPA to f/u with patient on OTC/RX pricing  Current Barriers:  . No specific barriers  Pharmacist Clinical Goal(s):  Marland Kitchen Over the next 365 days, patient will verbalize ability to afford treatment regimen . achieve adherence to monitoring guidelines and medication adherence to achieve therapeutic efficacy through collaboration with PharmD and provider.   Interventions: . 1:1 collaboration with Leamon Arnt, MD regarding development and update of comprehensive plan of care as evidenced by provider attestation and co-signature . Inter-disciplinary care team collaboration (see longitudinal plan of care) . Comprehensive medication review performed; medication list updated in electronic medical record  Hyperlipidemia: (LDL goal < 100) -controlled -Current treatment: . Pravastatin 20 mg  . Lovaza 1 gram two times daily   -Medications previously tried: Pravastatin 10 mg  -Current dietary patterns: see hyperglycemia -Current exercise habits: see hyperglycemia  -Educated on Benefits of statin for ASCVD risk reduction; Importance of limiting foods high in cholesterol; Exercise goal of 150 minutes per week; -Counseled on diet and exercise extensively Recommended to continue current medication  Prediabetes/hyperglycemia (A1c goal <6.5%) -controlled -Current medications: . N/A -Medications previously tried: N/A  -Current home glucose readings . fasting glucose: N/A . post prandial glucose: N/A -Denies hypoglycemic/hyperglycemic symptoms -Current meal patterns:  . breakfast: wellness maintenance - protein/carb focus. 2 eggs, 45 calorie toast, small clementine, coffee. Apple   . lunch: chicken vegetable soup, crackers, water. Might have 60 calorie yogurt w/ probiotic+ fruit (1-2x/day)  . dinner: salad, bigger portion of meat. Chicken, salmon. Tries to avoid red meats . snacks: low fat cheese . drinks: water with most meals -Current exercise: walking, pilates -Educated onA1c and blood sugar goals; Exercise goal of 150 minutes per week; Benefits of weight loss; -Counseled to check feet daily and get yearly eye exams -Counseled on diet and exercise extensively  PVCs -controlled  -Current treatment: . Acebutolol 200 mg once daily  . Flecainide 50 mg tablet - 1.5 tablets two times daily -Medications previously tried: n/a -Counseled on medications, reviewed side effects - no problems noted -Recommended to continue current medication  Depression/Anxiety (Goal: minimize symptoms, ensure medication safety) -controlled -Current treatment: . Sertraline 50 mg once daily -Medications previously tried/failed: sertraline 25 mg once daily, amitriptyline 25 mg once daily at bedtime, trazodone 25 mg -PHQ9: 0 -Educated on Benefits of medication for symptom control -Recommended to continue current  medication  Hypothyroidism (Goal: ensure safe/effective medication use) -controlled -Current treatment  . Levothyroxine 112 mcg once daily -Medications previously tried: n/a  -Recommended to continue current medication  Patient Goals/Self-Care Activities . Over the next 365 days, patient will:  - take medications as prescribed collaborate with  provider on medication access solutions  Follow Up Plan: Telephone follow up appointment with care management team member scheduled for: The care management team will reach out to the patient again over the next 7 days.  [place] to requesting following information from [facility] related to OTC pricing/ordering on:  Evening primrose 13 mg once daily  Voltaren gel OTC  fluticosone spray OTC  Glucosamine-chondroitin-msm -hyaluronic acid with at least 1000 mg glucosamine  Schiff brand preference would be ok with similar   Fish oil 1250 mg- 3 gels/tablets per day   turmeric 1500 mg- 3 caps/day  Madelin Rear, Pharm.D., BCGP Clinical Pharmacist [place] [phone]

## 2020-11-14 NOTE — Patient Instructions (Addendum)
[name],  Thank you for taking the time to review your medications with me today.  I have included our care plan/goals in the following pages. Please review and call me at [phone] with any questions!  Thanks! Mary Hernandez   Patient Care Plan: General Pharmacy (Adult)    Problem Identified: CHL AMB "PATIENT-SPECIFIC PROBLEM"     Long-Range Goal: Patient Stated   Start Date: 11/14/2020  Expected End Date: 11/14/2021  This Visit's Progress: On track  Priority: High  Note:   Medication Assistance: Cost review on OTC and Rx medications through [facility]  Patient's preferred pharmacy is:  [name] [address], [place] - [place] [address] Phone: [phone] Fax: [phone]  We discussed: Benefits of medication synchronization, packaging and delivery as well as enhanced pharmacist oversight with [facility]. Patient decided to: Perform OTC and Rx Cost Review.  Follow Up:  Patient agrees to Care Plan and Follow-up.  Plan: Telephone follow up appointment with care management team member scheduled for:  6 months and CPA to f/u with patient on OTC/RX pricing  Current Barriers:  . No specific barriers  Pharmacist Clinical Goal(s):  Marland Kitchen Over the next 365 days, patient will verbalize ability to afford treatment regimen . achieve adherence to monitoring guidelines and medication adherence to achieve therapeutic efficacy through collaboration with PharmD and provider.   Interventions: . 1:1 collaboration with Leamon Arnt, MD regarding development and update of comprehensive plan of care as evidenced by provider attestation and co-signature . Inter-disciplinary care team collaboration (see longitudinal plan of care) . Comprehensive medication review performed; medication list updated in electronic medical record  Hyperlipidemia: (LDL goal < 100) -controlled -Current treatment: . Pravastatin 20 mg  . Lovaza 1 gram two  times daily  -Medications previously tried: Pravastatin 10 mg  -Current dietary patterns: see hyperglycemia -Current exercise habits: see hyperglycemia  -Educated on Benefits of statin for ASCVD risk reduction; Importance of limiting foods high in cholesterol; Exercise goal of 150 minutes per week; -Counseled on diet and exercise extensively Recommended to continue current medication  Prediabetes/hyperglycemia (A1c goal <6.5%) -controlled -Current medications: . N/A -Medications previously tried: N/A  -Current home glucose readings . fasting glucose: N/A . post prandial glucose: N/A -Denies hypoglycemic/hyperglycemic symptoms -Current meal patterns:  . breakfast: wellness maintenance - protein/carb focus. 2 eggs, 45 calorie toast, small clementine, coffee. Apple   . lunch: chicken vegetable soup, crackers, water. Might have 60 calorie yogurt w/ probiotic+ fruit (1-2x/day)  . dinner: salad, bigger portion of meat. Chicken, salmon. Tries to avoid red meats . snacks: low fat cheese . drinks: water with most meals -Current exercise: walking, pilates -Educated onA1c and blood sugar goals; Exercise goal of 150 minutes per week; Benefits of weight loss; -Counseled to check feet daily and get yearly eye exams -Counseled on diet and exercise extensively  PVCs -controlled  -Current treatment: . Acebutolol 200 mg once daily  . Flecainide 50 mg tablet - 1.5 tablets two times daily -Medications previously tried: n/a -Counseled on medications, reviewed side effects - no problems noted -Recommended to continue current medication  Depression/Anxiety (Goal: minimize symptoms, ensure medication safety) -controlled -Current treatment: . Sertraline 50 mg once daily -Medications previously tried/failed: sertraline 25 mg once daily, amitriptyline 25 mg once daily at bedtime, trazodone 25 mg -PHQ9: 0 -Educated on Benefits of medication for symptom control -Recommended to continue current  medication  Hypothyroidism (Goal: ensure safe/effective medication use) -controlled -Current treatment  .  Levothyroxine 112 mcg once daily -Medications previously tried: n/a  -Recommended to continue current medication  Patient Goals/Self-Care Activities . Over the next 365 days, patient will:  - take medications as prescribed collaborate with provider on medication access solutions  Follow Up Plan: Telephone follow up appointment with care management team member scheduled for: The care management team will reach out to the patient again over the next 7 days.  [place] to requesting following information from [facility] related to OTC pricing/ordering on:  Evening primrose 13 mg once daily  Voltaren gel OTC  fluticosone spray OTC  Glucosamine-chondroitin-msm -hyaluronic acid with at least 1000 mg glucosamine  Schiff brand preference would be ok with similar   Fish oil 1250 mg- 3 gels/tablets per day   turmeric 1500 mg- 3 caps/day  Mary Hernandez, Pharm.D., BCGP Clinical Pharmacist [place] [phone]     Ms. Whisnant was given information about Chronic Care Management services today including:  1. CCM service includes personalized support from designated clinical staff supervised by her physician, including individualized plan of care and coordination with other care providers 2. 24/7 contact phone numbers for assistance for urgent and routine care needs. 3. Standard insurance, coinsurance, copays and deductibles apply for chronic care management only during months in which we provide at least 20 minutes of these services. Most insurances cover these services at 100%, however patients may be responsible for any copay, coinsurance and/or deductible if applicable. This service may help you avoid the need for more expensive face-to-face services. 4. Only one practitioner may furnish and bill the service in a calendar month. 5. The patient may stop  CCM services at any time (effective at the end of the month) by phone call to the office staff.  Patient agreed to services and verbal consent obtained.   The patient verbalized understanding of instructions provided today and agreed to receive a MyChart copy of patient instruction and/or educational materials. Telephone follow up appointment with pharmacy team member scheduled for: See next appointment with "Care Management Staff" under "What's Next" below.   Mary Hernandez, Pharm.D., BCGP Clinical Pharmacist [facility] [phone] High Cholesterol  High cholesterol is a condition in which the blood has high levels of a white, waxy substance similar to fat (cholesterol). The liver makes all the cholesterol that the body needs. The human body needs small amounts of cholesterol to help build cells. A person gets extra or excess cholesterol from the food that he or she eats. The blood carries cholesterol from the liver to the rest of the body. If you have high cholesterol, deposits (plaques) may build up on the walls of your arteries. Arteries are the blood vessels that carry blood away from your heart. These plaques make the arteries narrow and stiff. Cholesterol plaques increase your risk for heart attack and stroke. Work with your health care provider to keep your cholesterol levels in a healthy range. What increases the risk? The following factors may make you more likely to develop this condition:  Eating foods that are high in animal fat (saturated fat) or cholesterol.  Being overweight.  Not getting enough exercise.  A family history of high cholesterol (familial hypercholesterolemia).  Use of tobacco products.  Having diabetes. What are the signs or symptoms? There are no symptoms of this condition. How is this diagnosed? This condition may be diagnosed based on the results of a blood test.  If you are older than 69 years of  age, your health care  provider may check your cholesterol levels every 4-6 years.  You may be checked more often if you have high cholesterol or other risk factors for heart disease. The blood test for cholesterol measures:  "Bad" cholesterol, or LDL cholesterol. This is the main type of cholesterol that causes heart disease. The desired level is less than 100 mg/dL.  "Good" cholesterol, or HDL cholesterol. HDL helps protect against heart disease by cleaning the arteries and carrying the LDL to the liver for processing. The desired level for HDL is 60 mg/dL or higher.  Triglycerides. These are fats that your body can store or burn for energy. The desired level is less than 150 mg/dL.  Total cholesterol. This measures the total amount of cholesterol in your blood and includes LDL, HDL, and triglycerides. The desired level is less than 200 mg/dL. How is this treated? This condition may be treated with:  Diet changes. You may be asked to eat foods that have more fiber and less saturated fats or added sugar.  Lifestyle changes. These may include regular exercise, maintaining a healthy weight, and quitting use of tobacco products.  Medicines. These are given when diet and lifestyle changes have not worked. You may be prescribed a statin medicine to help lower your cholesterol levels. Follow these instructions at home: Eating and drinking  Eat a healthy, balanced diet. This diet includes: ? Daily servings of a variety of fresh, frozen, or canned fruits and vegetables. ? Daily servings of whole grain foods that are rich in fiber. ? Foods that are low in saturated fats and trans fats. These include poultry and fish without skin, lean cuts of meat, and low-fat dairy products. ? A variety of fish, especially oily fish that contain omega-3 fatty acids. Aim to eat fish at least 2 times a week.  Avoid foods and drinks that have added sugar.  Use healthy cooking methods, such as roasting, grilling, broiling, baking,  poaching, steaming, and stir-frying. Do not fry your food except for stir-frying.   Lifestyle  Get regular exercise. Aim to exercise for a total of 150 minutes a week. Increase your activity level by doing activities such as gardening, walking, and taking the stairs.  Do not use any products that contain nicotine or tobacco, such as cigarettes, e-cigarettes, and chewing tobacco. If you need help quitting, ask your health care provider.   General instructions  Take over-the-counter and prescription medicines only as told by your health care provider.  Keep all follow-up visits as told by your health care provider. This is important. Where to find more information  American Heart Association: www.heart.org  National Heart, Lung, and Blood Institute: https://wilson-eaton.com/ Contact a health care provider if:  You have trouble achieving or maintaining a healthy diet or weight.  You are starting an exercise program.  You are unable to stop smoking. Get help right away if:  You have chest pain.  You have trouble breathing.  You have any symptoms of a stroke. "BE FAST" is an easy way to remember the main warning signs of a stroke: ? B - Balance. Signs are dizziness, sudden trouble walking, or loss of balance. ? E - Eyes. Signs are trouble seeing or a sudden change in vision. ? F - Face. Signs are sudden weakness or numbness of the face, or the face or eyelid drooping on one side. ? A - Arms. Signs are weakness or numbness in an arm. This happens suddenly and usually on one side  of the body. ? S - Speech. Signs are sudden trouble speaking, slurred speech, or trouble understanding what people say. ? T - Time. Time to call emergency services. Write down what time symptoms started.  You have other signs of a stroke, such as: ? A sudden, severe headache with no known cause. ? Nausea or vomiting. ? Seizure. These symptoms may represent a serious problem that is an emergency. Do not wait to see  if the symptoms will go away. Get medical help right away. Call your local emergency services (911 in the [place] Do not drive yourself to the hospital. Summary  Cholesterol plaques increase your risk for heart attack and stroke. Work with your health care provider to keep your cholesterol levels in a healthy range.  Eat a healthy, balanced diet, get regular exercise, and maintain a healthy weight.  Do not use any products that contain nicotine or tobacco, such as cigarettes, e-cigarettes, and chewing tobacco.  Get help right away if you have any symptoms of a stroke. This information is not intended to replace advice given to you by your health care provider. Make sure you discuss any questions you have with your health care provider. Document Revised: 08/23/2019 Document Reviewed: 08/23/2019 Elsevier Patient Education  2021 [employer].

## 2020-11-15 ENCOUNTER — Other Ambulatory Visit: Payer: Self-pay

## 2020-11-15 ENCOUNTER — Ambulatory Visit (INDEPENDENT_AMBULATORY_CARE_PROVIDER_SITE_OTHER): Payer: PPO | Admitting: Family Medicine

## 2020-11-15 ENCOUNTER — Encounter (INDEPENDENT_AMBULATORY_CARE_PROVIDER_SITE_OTHER): Payer: Self-pay | Admitting: Family Medicine

## 2020-11-15 ENCOUNTER — Telehealth: Payer: Self-pay

## 2020-11-15 VITALS — BP 100/49 | HR 68 | Temp 97.6°F | Ht 65.0 in | Wt 180.0 lb

## 2020-11-15 DIAGNOSIS — Z6830 Body mass index (BMI) 30.0-30.9, adult: Secondary | ICD-10-CM | POA: Diagnosis not present

## 2020-11-15 DIAGNOSIS — E669 Obesity, unspecified: Secondary | ICD-10-CM | POA: Diagnosis not present

## 2020-11-15 DIAGNOSIS — E559 Vitamin D deficiency, unspecified: Secondary | ICD-10-CM | POA: Diagnosis not present

## 2020-11-15 DIAGNOSIS — R7303 Prediabetes: Secondary | ICD-10-CM | POA: Diagnosis not present

## 2020-11-15 DIAGNOSIS — E663 Overweight: Secondary | ICD-10-CM

## 2020-11-15 DIAGNOSIS — E039 Hypothyroidism, unspecified: Secondary | ICD-10-CM

## 2020-11-15 DIAGNOSIS — E782 Mixed hyperlipidemia: Secondary | ICD-10-CM | POA: Diagnosis not present

## 2020-11-15 NOTE — Chronic Care Management (AMB) (Signed)
Chronic Care Management Pharmacy Assistant   Name: Mary Hernandez  MRN: [id number] DOB: 1951/04/17  Reason for Encounter: Medication Review   PCP : Leamon Arnt, MD  Allergies:   Allergies  Allergen Reactions   Seasonal Ic [Cholestatin]    Adhesive [Tape] Rash    Medications: Outpatient Encounter Medications as of 11/15/2020  Medication Sig Note   acebutolol (SECTRAL) 200 MG capsule TAKE ONE CAPSULE BY MOUTH DAILY    Calcium Citrate-Vitamin D 315-250 MG-UNIT TABS Take by mouth.    cetirizine (ZYRTEC) 10 MG tablet Take 10 mg by mouth daily.    diclofenac Sodium (VOLTAREN) 1 % GEL Apply topically 4 (four) times daily.    EVENING PRIMROSE OIL PO Take 1,500 mg by mouth 2 (two) times daily.    flecainide (TAMBOCOR) 50 MG tablet Take 1.5 tablets (75 mg total) by mouth 2 (two) times daily.    fluticasone (FLONASE) 50 MCG/ACT nasal spray Place 2 sprays into both nostrils daily.    Glucosamine-Chondroit-Vit C-Mn (GLUCOSAMINE 1500 COMPLEX) CAPS Take by mouth.    glycopyrrolate (ROBINUL) 1 MG tablet Take 1 tablet (1 mg total) by mouth 2 (two) times daily.    hydrochlorothiazide (HYDRODIURIL) 25 MG tablet Take 12.5 mg by mouth every morning.    Hypromellose (ISOPTO TEARS) 0.5 % SOLN Apply to eye. 11/13/2020: As needed   levothyroxine (SYNTHROID) 112 MCG tablet TAKE ONE TABLET BY MOUTH DAILY    naltrexone (DEPADE) 50 MG tablet Take 50 mg by mouth daily. 1/2 tablet am for 1 week, then 1/2 am and 1/2 pm after 1 week    omega-3 acid ethyl esters (LOVAZA) 1 g capsule Take 1 g by mouth 2 (two) times daily.    ondansetron (ZOFRAN-ODT) 4 MG disintegrating tablet Take 4 mg by mouth every 8 (eight) hours as needed for nausea or vomiting.    Potassium Chloride ER 20 MEQ TBCR Take 2 tablets (40 meq) in the AM and take 1 tablet (20 meq) in the PM as by mouth as directed (Patient taking differently: Take 20 mEq by mouth daily. Take 2 tablets (40 meq) in the AM and take 1 tablet (20  meq) in the PM as by mouth as directed)    pravastatin (PRAVACHOL) 20 MG tablet Take 1 tablet (20 mg total) by mouth at bedtime.    pregabalin (LYRICA) 75 MG capsule Take 75 mg by mouth 2 (two) times daily.    Probiotic Product (PROBIOTIC-10 PO) Take by mouth.    Semaglutide,0.25 or 0.5MG /DOS, (OZEMPIC, 0.25 OR 0.5 MG/DOSE,) 2 MG/1.5ML SOPN Inject 1 mg into the skin once a week. 11/13/2020: 1.5 mg   sertraline (ZOLOFT) 50 MG tablet Take 1 tablet (50 mg total) by mouth daily.    TART CHERRY PO Take by mouth.    Turmeric 400 MG CAPS Take by mouth.    No facility-administered encounter medications on file as of 11/15/2020.    Current Diagnosis: Patient Active Problem List   Diagnosis Date Noted   Obesity, Class I, BMI 30-34.9 10/07/2019   Stress reaction 10/07/2019   Notalgia paresthetica 07/01/2019   Insomnia due to medical condition 10/18/2018   Prediabetes 10/12/2018   History of rotator cuff syndrome with tear and adhesive capsulitis 09/11/2018   History of lateral epicondylitis of right elbow 06/25/2018   Diverticulosis 03/30/2018   GERD (gastroesophageal reflux disease) 03/30/2018   Mixed hyperlipidemia 03/01/2018   Acquired hypothyroidism 03/01/2018   Meniere disease 03/01/2018   Frequent PVCs 03/01/2018  Reviewed chart for medication changes ahead of medication coordination call.  [facility] Total yearly drug cost + premium cost - [phone]  [facility] Total yearly drug cost + premium cost - [phone]  Both pharmacies provide about the same cost of each drug.  [facility] ~$24 cheaper per year.  Patient aware and verbally understands, she chooses to [facility] her prescription medications [facility] Patient wants to buy and receive her OTC medications through [facility] through packaging.  Onboarding process completed.  Confirmed delivery date of 12/04/2020,  advised patient that pharmacy will contact them the morning of delivery.  April D Calhoun, [place] Pharmacist Assistant [phone]   Follow-Up:  Coordination of Enhanced Pharmacy Services, Medication Cost Review and Pharmacist Review

## 2020-11-16 ENCOUNTER — Encounter (INDEPENDENT_AMBULATORY_CARE_PROVIDER_SITE_OTHER): Payer: Self-pay | Admitting: Family Medicine

## 2020-11-17 DIAGNOSIS — M25531 Pain in right wrist: Secondary | ICD-10-CM | POA: Diagnosis not present

## 2020-11-17 DIAGNOSIS — M654 Radial styloid tenosynovitis [de Quervain]: Secondary | ICD-10-CM | POA: Diagnosis not present

## 2020-11-17 DIAGNOSIS — M25541 Pain in joints of right hand: Secondary | ICD-10-CM | POA: Diagnosis not present

## 2020-11-20 ENCOUNTER — Telehealth: Payer: Self-pay

## 2020-11-20 NOTE — Telephone Encounter (Signed)
  LAST APPOINTMENT DATE: 11/15/2020   NEXT APPOINTMENT DATE:@8 /07/2021  MEDICATION:diclofenac Sodium (VOLTAREN) 1 % GEL  PHARMACY: [facility] :[phone]

## 2020-11-20 NOTE — Progress Notes (Signed)
Chief Complaint:   OBESITY Mary Hernandez is here to discuss her progress with her obesity treatment plan along with follow-up of her obesity related diagnoses.   Today's visit was #: 6 Starting weight: 192 lbs Starting date: 07/03/2020 Today's weight: 180 lbs Today's date: 11/15/2020 Total lbs lost to date: 12 lbs Body mass index is 29.95 kg/m.  Total weight loss percentage to date: -6.25%  Interim History: Mary Hernandez has been doing PT Pilates.  She is on Ozempic 1.5 mg subcutaneously weekly. Nutrition Plan: Category 2 Plan for 60-70% of the time. Anti-obesity medications: Ozempic 1.5 mg subcu weekly. Reported side effects: None. Activity: Walking / pilates for 20-40 minutes 4-6 times per week.  Assessment/Plan:   1. Prediabetes Not at goal. Goal is HgbA1c < 5.7.  Medication: Ozempic 1.5 mg subcutaneously weekly.    Plan:  She will continue to focus on protein-rich, low simple carbohydrate foods. We reviewed the importance of hydration, regular exercise for stress reduction, and restorative sleep.   Lab Results  Component Value Date   HGBA1C 5.9 (H) 10/12/2020   Lab Results  Component Value Date   INSULIN 27.5 (H) 10/12/2020   INSULIN 21.9 07/03/2020   2. Mixed hyperlipidemia Course: Controlled. Lipid-lowering medications: Pravachol 20 mg daily.   Plan: Dietary changes: Increase soluble fiber, decrease simple carbohydrates, decrease saturated fat. Exercise changes: Moderate to vigorous-intensity aerobic activity 150 minutes per week or as tolerated. We will continue to monitor along with PCP/specialists as it pertains to her weight loss journey.  Lab Results  Component Value Date   CHOL 173 10/12/2020   HDL 57 10/12/2020   LDLCALC 90 10/12/2020   LDLDIRECT 94.0 05/01/2018   TRIG 149 10/12/2020   CHOLHDL 2.8 06/21/2020   Lab Results  Component Value Date   ALT 18 10/12/2020   AST 17 10/12/2020   ALKPHOS 76 10/12/2020   BILITOT 0.2 10/12/2020   The 10-year ASCVD risk  score Mary Hernandez., et al., 2013) is: 11.2%   Values used to calculate the score:     Age: 70 years     Sex: Female     Is Non-Hispanic African American: No     Diabetic: Yes     Tobacco smoker: No     Systolic Blood Pressure: 409 mmHg     Is BP treated: Yes     HDL Cholesterol: 57 mg/dL     Total Cholesterol: 173 mg/dL  3. Vitamin D deficiency Controlled. Current vitamin D is 52.3, tested on 07/03/2020.   Plan: Follow-up for routine testing of Vitamin D, at least 2-3 times per year to avoid over-replacement.  4. Acquired hypothyroidism Course: Stable. Medication: levothyroxine 112 mcg daily.   Plan: Patient was instructed not to take MVM or iron within 4 hours of taking thyroid medications. This issue is managed by her PCP. We will continue to monitor alongside Endocrinology/PCP as it relates to her weight loss journey.   Lab Results  Component Value Date   TSH 0.557 10/12/2020   5. Class 1 obesity with serious comorbidity and body mass index (BMI) of 30.0 to 30.9 in adult, unspecified obesity type  Course: Mary Hernandez is currently in the action stage of change. As such, her goal is to continue with weight loss efforts.   Nutrition goals: She has agreed to the Category 2 Plan.   Exercise goals: As is.  Behavioral modification strategies: increasing lean protein intake, decreasing simple carbohydrates, increasing vegetables and increasing water intake.  Mary Hernandez has agreed  to follow-up with our clinic in 3 weeks. She was informed of the importance of frequent follow-up visits to maximize her success with intensive lifestyle modifications for her multiple health conditions.   Objective:   Blood pressure (!) 100/49, pulse 68, temperature 97.6 F (36.4 C), temperature source Oral, height 5\' 5"  (1.651 m), weight 180 lb (81.6 kg), SpO2 98 %. Body mass index is 29.95 kg/m.  General: Cooperative, alert, well developed, in no acute distress. HEENT: Conjunctivae and lids  unremarkable. Cardiovascular: Regular rhythm.  Lungs: Normal work of breathing. Neurologic: No focal deficits.   Lab Results  Component Value Date   CREATININE 0.56 (L) 10/18/2020   BUN 15 10/18/2020   NA 140 10/18/2020   K 4.0 10/18/2020   CL 100 10/18/2020   CO2 24 10/18/2020   Lab Results  Component Value Date   ALT 18 10/12/2020   AST 17 10/12/2020   ALKPHOS 76 10/12/2020   BILITOT 0.2 10/12/2020   Lab Results  Component Value Date   HGBA1C 5.9 (H) 10/12/2020   HGBA1C 6.2 (H) 06/21/2020   HGBA1C 5.9 (A) 10/07/2019   HGBA1C 6.2 05/17/2019   HGBA1C 5.9 10/12/2018   Lab Results  Component Value Date   INSULIN 27.5 (H) 10/12/2020   INSULIN 21.9 07/03/2020   Lab Results  Component Value Date   TSH 0.557 10/12/2020   Lab Results  Component Value Date   CHOL 173 10/12/2020   HDL 57 10/12/2020   LDLCALC 90 10/12/2020   LDLDIRECT 94.0 05/01/2018   TRIG 149 10/12/2020   CHOLHDL 2.8 06/21/2020   Lab Results  Component Value Date   WBC 6.0 06/21/2020   HGB 13.7 06/21/2020   HCT 41.2 06/21/2020   MCV 91.6 06/21/2020   PLT 223 06/21/2020   Obesity Behavioral Intervention:   Approximately 15 minutes were spent on the discussion below.  ASK: We discussed the diagnosis of obesity with Mary Hernandez today and Mary Hernandez agreed to give [place] permission to discuss obesity behavioral modification therapy today.  ASSESS: Mary Hernandez has the diagnosis of obesity and her BMI today is 30.0. Mary Hernandez is in the action stage of change.   ADVISE: Mary Hernandez was educated on the multiple health risks of obesity as well as the benefit of weight loss to improve her health. She was advised of the need for long term treatment and the importance of lifestyle modifications to improve her current health and to decrease her risk of future health problems.  AGREE: Multiple dietary modification options and treatment options were discussed and Mary Hernandez agreed to follow the recommendations documented in the above  note.  ARRANGE: Mary Hernandez was educated on the importance of frequent visits to treat obesity as outlined per CMS and USPSTF guidelines and agreed to schedule her next follow up appointment today.  Attestation Statements:   Reviewed by clinician on day of visit: allergies, medications, problem list, medical history, surgical history, family history, social history, and previous encounter notes.  I, Water quality scientist, CMA, am acting as transcriptionist for [name], DO  I have reviewed the above documentation for accuracy and completeness, and I agree with the above. [name], DO

## 2020-11-22 ENCOUNTER — Other Ambulatory Visit: Payer: Self-pay

## 2020-11-22 NOTE — Telephone Encounter (Signed)
Please advise, this medication has not been filled by you previously

## 2020-11-22 NOTE — Telephone Encounter (Signed)
Refill request sent to PCP.

## 2020-11-23 ENCOUNTER — Encounter: Payer: Self-pay | Admitting: Family Medicine

## 2020-11-23 MED ORDER — DICLOFENAC SODIUM 1 % EX GEL: 2.0000 g | Freq: Four times a day (QID) | CUTANEOUS | 5 refills | Status: AC

## 2020-11-27 ENCOUNTER — Telehealth: Payer: Self-pay

## 2020-11-27 NOTE — Chronic Care Management (AMB) (Signed)
Chronic Care Management Pharmacy Assistant   Name: Mary Hernandez  MRN: [id number] DOB: 01/10/1951  Reason for Encounter: Medication Review  PCP : Leamon Arnt, MD  Allergies:   Allergies  Allergen Reactions  . Seasonal Ic [Cholestatin]   . Adhesive [Tape] Rash    Medications: Outpatient Encounter Medications as of 11/27/2020  Medication Sig Note  . acebutolol (SECTRAL) 200 MG capsule TAKE ONE CAPSULE BY MOUTH DAILY   . Calcium Citrate-Vitamin D 315-250 MG-UNIT TABS Take by mouth.   . cetirizine (ZYRTEC) 10 MG tablet Take 10 mg by mouth daily.   . diclofenac Sodium (VOLTAREN) 1 % GEL Apply 2 g topically 4 (four) times daily.   Marland Kitchen EVENING PRIMROSE OIL PO Take 1,500 mg by mouth 2 (two) times daily.   . flecainide (TAMBOCOR) 50 MG tablet Take 1.5 tablets (75 mg total) by mouth 2 (two) times daily.   . fluticasone (FLONASE) 50 MCG/ACT nasal spray Place 2 sprays into both nostrils daily.   . Glucosamine-Chondroit-Vit C-Mn (GLUCOSAMINE 1500 COMPLEX) CAPS Take by mouth.   Marland Kitchen glycopyrrolate (ROBINUL) 1 MG tablet Take 1 tablet (1 mg total) by mouth 2 (two) times daily.   . hydrochlorothiazide (HYDRODIURIL) 25 MG tablet Take 12.5 mg by mouth every morning.   Marland Kitchen levothyroxine (SYNTHROID) 112 MCG tablet TAKE ONE TABLET BY MOUTH DAILY   . naltrexone (DEPADE) 50 MG tablet Take 50 mg by mouth daily. 1/2 tablet am for 1 week, then 1/2 am and 1/2 pm after 1 week   . omega-3 acid ethyl esters (LOVAZA) 1 g capsule Take 1 g by mouth 2 (two) times daily.   . ondansetron (ZOFRAN-ODT) 4 MG disintegrating tablet Take 4 mg by mouth every 8 (eight) hours as needed for nausea or vomiting.   . Potassium Chloride ER 20 MEQ TBCR Take 2 tablets (40 meq) in the AM and take 1 tablet (20 meq) in the PM as by mouth as directed (Patient taking differently: Take 20 mEq by mouth daily. Take 2 tablets (40 meq) in the AM and take 1 tablet (20 meq) in the PM as by mouth as directed)   . pravastatin (PRAVACHOL) 20 MG  tablet Take 1 tablet (20 mg total) by mouth at bedtime.   . pregabalin (LYRICA) 75 MG capsule Take 75 mg by mouth 2 (two) times daily.   . Probiotic Product (PROBIOTIC-10 PO) Take by mouth.   . Semaglutide,0.25 or 0.5MG /DOS, (OZEMPIC, 0.25 OR 0.5 MG/DOSE,) 2 MG/1.5ML SOPN Inject 1 mg into the skin once a week. 11/13/2020: 1.5 mg  . sertraline (ZOLOFT) 50 MG tablet Take 1 tablet (50 mg total) by mouth daily.   Marland Kitchen TART CHERRY PO Take by mouth.   . Turmeric 400 MG CAPS Take by mouth.    No facility-administered encounter medications on file as of 11/27/2020.    Current Diagnosis: Patient Active Problem List   Diagnosis Date Noted  . Obesity, Class I, BMI 30-34.9 10/07/2019  . Stress reaction 10/07/2019  . Notalgia paresthetica 07/01/2019  . Insomnia due to medical condition 10/18/2018  . Prediabetes 10/12/2018  . History of rotator cuff syndrome with tear and adhesive capsulitis 09/11/2018  . History of lateral epicondylitis of right elbow 06/25/2018  . Diverticulosis 03/30/2018  . GERD (gastroesophageal reflux disease) 03/30/2018  . Mixed hyperlipidemia 03/01/2018  . Acquired hypothyroidism 03/01/2018  . Meniere disease 03/01/2018  . Frequent PVCs 03/01/2018    Reviewed chart for medication changes ahead of medication coordination call.  No  OVs, Consults, or hospital visits since last care coordination call/Pharmacist visit. (If appropriate, list visit date, provider name)  No medication changes indicated OR if recent visit, treatment plan here.  BP Readings from Last 3 Encounters:  11/15/20 (!) 100/49  10/12/20 105/68  09/27/20 106/68    Lab Results  Component Value Date   HGBA1C 5.9 (H) 10/12/2020     Patient obtains medications through Adherence Packaging  90 Days   Patient is due for next adherence delivery on: 12/05/2020. Called patient and reviewed medications and coordinated delivery.  This delivery to include: Fish Oil 1200 mg; two capsules every morning and one  capsule every evening Evening primrose 1,000; one capsule every morning Calcium Citrate 315 mg Calcium Vitamin D3 6.25 mcg tablet; one tablet every morning and one tablet every evening Glucosamine-Chondroitin 750-600 mg tablet; one tablet every morning and one tablet every evening Claritin 10 mg tablet; one tablet every evening Tart Cherry 1200 mg tablet; one tablet every morning Lactobacillus Acidophilus 1.5 mg capsule; one capsule every morning  Confirmed delivery date of 12/05/2020, advised patient that pharmacy will contact them the morning of delivery.  April D Calhoun, [place] Pharmacist Assistant [phone]   Follow-Up:  Coordination of Enhanced Pharmacy Services and Pharmacist Review

## 2020-11-30 DIAGNOSIS — M9905 Segmental and somatic dysfunction of pelvic region: Secondary | ICD-10-CM | POA: Diagnosis not present

## 2020-11-30 DIAGNOSIS — M9904 Segmental and somatic dysfunction of sacral region: Secondary | ICD-10-CM | POA: Diagnosis not present

## 2020-11-30 DIAGNOSIS — M9908 Segmental and somatic dysfunction of rib cage: Secondary | ICD-10-CM | POA: Diagnosis not present

## 2020-11-30 DIAGNOSIS — M9902 Segmental and somatic dysfunction of thoracic region: Secondary | ICD-10-CM | POA: Diagnosis not present

## 2020-11-30 DIAGNOSIS — M9901 Segmental and somatic dysfunction of cervical region: Secondary | ICD-10-CM | POA: Diagnosis not present

## 2020-11-30 DIAGNOSIS — M545 Low back pain, unspecified: Secondary | ICD-10-CM | POA: Diagnosis not present

## 2020-11-30 DIAGNOSIS — M654 Radial styloid tenosynovitis [de Quervain]: Secondary | ICD-10-CM | POA: Diagnosis not present

## 2020-11-30 DIAGNOSIS — M9903 Segmental and somatic dysfunction of lumbar region: Secondary | ICD-10-CM | POA: Diagnosis not present

## 2020-11-30 DIAGNOSIS — G44209 Tension-type headache, unspecified, not intractable: Secondary | ICD-10-CM | POA: Diagnosis not present

## 2020-12-03 ENCOUNTER — Encounter (INDEPENDENT_AMBULATORY_CARE_PROVIDER_SITE_OTHER): Payer: Self-pay | Admitting: Family Medicine

## 2020-12-03 DIAGNOSIS — F4323 Adjustment disorder with mixed anxiety and depressed mood: Secondary | ICD-10-CM

## 2020-12-04 NOTE — Telephone Encounter (Signed)
Dr.Wallace °

## 2020-12-18 ENCOUNTER — Ambulatory Visit (INDEPENDENT_AMBULATORY_CARE_PROVIDER_SITE_OTHER): Payer: PPO | Admitting: Family Medicine

## 2020-12-18 ENCOUNTER — Other Ambulatory Visit: Payer: Self-pay

## 2020-12-18 ENCOUNTER — Encounter (INDEPENDENT_AMBULATORY_CARE_PROVIDER_SITE_OTHER): Payer: Self-pay | Admitting: Family Medicine

## 2020-12-18 VITALS — BP 98/62 | HR 69 | Temp 97.9°F | Ht 65.0 in | Wt 180.0 lb

## 2020-12-18 DIAGNOSIS — E8881 Metabolic syndrome: Secondary | ICD-10-CM | POA: Diagnosis not present

## 2020-12-18 DIAGNOSIS — K5909 Other constipation: Secondary | ICD-10-CM

## 2020-12-18 DIAGNOSIS — F411 Generalized anxiety disorder: Secondary | ICD-10-CM | POA: Diagnosis not present

## 2020-12-18 DIAGNOSIS — Z6830 Body mass index (BMI) 30.0-30.9, adult: Secondary | ICD-10-CM | POA: Diagnosis not present

## 2020-12-18 DIAGNOSIS — E669 Obesity, unspecified: Secondary | ICD-10-CM

## 2020-12-18 DIAGNOSIS — E66811 Obesity, class 1: Secondary | ICD-10-CM

## 2020-12-18 DIAGNOSIS — M797 Fibromyalgia: Secondary | ICD-10-CM | POA: Diagnosis not present

## 2020-12-19 MED ORDER — OZEMPIC (1 MG/DOSE) 4 MG/3ML ~~LOC~~ SOPN: 1.0000 mg | PEN_INJECTOR | SUBCUTANEOUS | 0 refills | Status: DC

## 2020-12-20 DIAGNOSIS — M9902 Segmental and somatic dysfunction of thoracic region: Secondary | ICD-10-CM | POA: Diagnosis not present

## 2020-12-20 DIAGNOSIS — G4485 Primary stabbing headache: Secondary | ICD-10-CM | POA: Diagnosis not present

## 2020-12-20 DIAGNOSIS — G4486 Cervicogenic headache: Secondary | ICD-10-CM | POA: Diagnosis not present

## 2020-12-20 DIAGNOSIS — M9908 Segmental and somatic dysfunction of rib cage: Secondary | ICD-10-CM | POA: Diagnosis not present

## 2020-12-20 DIAGNOSIS — M9907 Segmental and somatic dysfunction of upper extremity: Secondary | ICD-10-CM | POA: Diagnosis not present

## 2020-12-20 DIAGNOSIS — M9901 Segmental and somatic dysfunction of cervical region: Secondary | ICD-10-CM | POA: Diagnosis not present

## 2020-12-25 ENCOUNTER — Encounter: Payer: Self-pay | Admitting: Family Medicine

## 2020-12-25 MED ORDER — BENZONATATE 100 MG PO CAPS: 100.0000 mg | ORAL_CAPSULE | Freq: Every day | ORAL | 0 refills | Status: DC | PRN

## 2020-12-25 NOTE — Progress Notes (Signed)
Chief Complaint:   OBESITY Mary Hernandez is here to discuss her progress with her obesity treatment plan along with follow-up of her obesity related diagnoses.   Today's visit was #: 7 Starting weight: 192 lbs Starting date: 07/03/2020 Today's weight: 180 lbs Today's date: 12/18/2020 Total lbs lost to date: 12 lbs Body mass index is 29.95 kg/m.  Total weight loss percentage to date: -6.25%  Interim History:  [name] endorses constipation.  She says she has been controlling it with bowel regimen, fiber and MiraLAX.  She has increased fullness but denies nausea.  She was walking 10-15 minutes and is now up to 22 minutes.  She is also dong PT pilates with Myrene Galas.  She says she has had less polyphagia and has been drinking more water.    Current Meal Plan: the Category 2 Plan for 75-85% of the time.  Current Exercise Plan: Pilates/walking/stretching for 22-60 minutes 5 times per week. Current Anti-Obesity Medications: Ozempic. She endorses a mild headache.    Assessment/Plan:   1. Metabolic syndrome Starting goal: Lose 7-10% of starting weight. She will continue to focus on protein-rich, low simple carbohydrate foods. We reviewed the importance of hydration, regular exercise for stress reduction, and restorative sleep.  We will continue to check lab work every 3 months, with 10% weight loss, or should any other concerns arise.  Plan:  Increase Ozempic to 1.5 mg by the next visit (has samples).  Ozempic 1.2 mg to 1.5 mg subcutaneously weekly.  - Refill Semaglutide, 1 MG/DOSE, (OZEMPIC, 1 MG/DOSE,) 4 MG/3ML SOPN; Inject 1 mg into the skin once a week.  Dispense: 9 mL; Refill: 0  2. Other constipation We reviewed the bowel regimen.  She will increase MiraLAX.  This problem is controlled. [name] was informed that a decrease in bowel movement frequency is normal while losing weight, but stools should not be hard or painful.  Counseling: Getting to Good Bowel Health: Your goal is to have  one soft bowel movement each day. Drink at least 8 glasses of water each day. Eat plenty of fiber (goal is over 30 grams each day). It is best to get most of your fiber from dietary sources which includes leafy green vegetables, fresh fruit, and whole grains. You may need to add fiber with the help of OTC fiber supplements. These include Metamucil, Citrucel, and Benefiber. If you are still having trouble, try adding an osmotic laxative such as Miralax. If all of these changes do not work, [place].   3. Fibromyalgia Followed by Dr. Paulla Fore of Sports Medicine. We will continue to monitor symptoms as they relate to her weight loss journey.  4. GAD (generalized anxiety disorder) Behavior modification techniques were discussed today to help [name] deal with her anxiety.    5. Class 1 obesity with serious comorbidity and body mass index (BMI) of 30.0 to 30.9 in adult, unspecified obesity type  Course: [name] is currently in the action stage of change. As such, her goal is to continue with weight loss efforts.   Nutrition goals: She has agreed to the Category 2 Plan.   Exercise goals: As is.  Behavioral modification strategies: increasing lean protein intake, decreasing simple carbohydrates, increasing vegetables and increasing water intake.  [name] has agreed to follow-up with our clinic in 3 weeks. She was informed of the importance of frequent follow-up visits to maximize her success with intensive lifestyle modifications for her multiple health conditions.   Objective:   Blood pressure 98/62, pulse  69, temperature 97.9 F (36.6 C), temperature source Oral, height 5\' 5"  (1.651 m), weight 180 lb (81.6 kg), SpO2 97 %. Body mass index is 29.95 kg/m.  General: Cooperative, alert, well developed, in no acute distress. HEENT: Conjunctivae and lids unremarkable. Cardiovascular: Regular rhythm.  Lungs: Normal work of breathing. Neurologic: No focal deficits.   Lab Results  Component  Value Date   CREATININE 0.56 (L) 10/18/2020   BUN 15 10/18/2020   NA 140 10/18/2020   K 4.0 10/18/2020   CL 100 10/18/2020   CO2 24 10/18/2020   Lab Results  Component Value Date   ALT 18 10/12/2020   AST 17 10/12/2020   ALKPHOS 76 10/12/2020   BILITOT 0.2 10/12/2020   Lab Results  Component Value Date   HGBA1C 5.9 (H) 10/12/2020   HGBA1C 6.2 (H) 06/21/2020   HGBA1C 5.9 (A) 10/07/2019   HGBA1C 6.2 05/17/2019   HGBA1C 5.9 10/12/2018   Lab Results  Component Value Date   INSULIN 27.5 (H) 10/12/2020   INSULIN 21.9 07/03/2020   Lab Results  Component Value Date   TSH 0.557 10/12/2020   Lab Results  Component Value Date   CHOL 173 10/12/2020   HDL 57 10/12/2020   LDLCALC 90 10/12/2020   LDLDIRECT 94.0 05/01/2018   TRIG 149 10/12/2020   CHOLHDL 2.8 06/21/2020   Lab Results  Component Value Date   WBC 6.0 06/21/2020   HGB 13.7 06/21/2020   HCT 41.2 06/21/2020   MCV 91.6 06/21/2020   PLT 223 06/21/2020   Obesity Behavioral Intervention:   Approximately 15 minutes were spent on the discussion below.  ASK: We discussed the diagnosis of obesity with Edd Fabian today and Lyda agreed to give [place] permission to discuss obesity behavioral modification therapy today.  ASSESS: [name] has the diagnosis of obesity and her BMI today is 30.1. [name] is in the action stage of change.   ADVISE: [name] was educated on the multiple health risks of obesity as well as the benefit of weight loss to improve her health. She was advised of the need for long term treatment and the importance of lifestyle modifications to improve her current health and to decrease her risk of future health problems.  AGREE: Multiple dietary modification options and treatment options were discussed and [name] agreed to follow the recommendations documented in the above note.  ARRANGE: [name] was educated on the importance of frequent visits to treat obesity as outlined per CMS and USPSTF guidelines and agreed to  schedule her next follow up appointment today.  Attestation Statements:   Reviewed by clinician on day of visit: allergies, medications, problem list, medical history, surgical history, family history, social history, and previous encounter notes.  I, Water quality scientist, CMA, am acting as transcriptionist for [name], DO  I have reviewed the above documentation for accuracy and completeness, and I agree with the above. [name], DO

## 2020-12-26 ENCOUNTER — Telehealth: Payer: Self-pay

## 2020-12-26 NOTE — Progress Notes (Unsigned)
Chronic Care Management Pharmacy Assistant   Name: Mary Hernandez  MRN: [id number] DOB: 27-Oct-1950  Reason for Encounter: Medication Coordination    Medications: Outpatient Encounter Medications as of 12/26/2020  Medication Sig Note   acebutolol (SECTRAL) 200 MG capsule TAKE ONE CAPSULE BY MOUTH DAILY    benzonatate (TESSALON) 100 MG capsule Take 1 capsule (100 mg total) by mouth daily as needed for cough.    Calcium Citrate-Vitamin D 315-250 MG-UNIT TABS Take by mouth.    cetirizine (ZYRTEC) 10 MG tablet Take 10 mg by mouth daily.    diclofenac Sodium (VOLTAREN) 1 % GEL Apply 2 g topically 4 (four) times daily.    EVENING PRIMROSE OIL PO Take 1,500 mg by mouth 2 (two) times daily.    flecainide (TAMBOCOR) 50 MG tablet Take 1.5 tablets (75 mg total) by mouth 2 (two) times daily.    fluticasone (FLONASE) 50 MCG/ACT nasal spray Place 2 sprays into both nostrils daily.    Glucosamine-Chondroit-Vit C-Mn (GLUCOSAMINE 1500 COMPLEX) CAPS Take by mouth.    glycopyrrolate (ROBINUL) 1 MG tablet Take 1 tablet (1 mg total) by mouth 2 (two) times daily.    hydrochlorothiazide (HYDRODIURIL) 25 MG tablet Take 12.5 mg by mouth every morning.    levothyroxine (SYNTHROID) 112 MCG tablet TAKE ONE TABLET BY MOUTH DAILY    naltrexone (DEPADE) 50 MG tablet Take 50 mg by mouth daily. 1/2 tablet am for 1 week, then 1/2 am and 1/2 pm after 1 week    omega-3 acid ethyl esters (LOVAZA) 1 g capsule Take 1 g by mouth 2 (two) times daily.    ondansetron (ZOFRAN-ODT) 4 MG disintegrating tablet Take 4 mg by mouth every 8 (eight) hours as needed for nausea or vomiting.    Potassium Chloride ER 20 MEQ TBCR Take 2 tablets (40 meq) in the AM and take 1 tablet (20 meq) in the PM as by mouth as directed (Patient taking differently: Take 20 mEq by mouth daily. Take 2 tablets (40 meq) in the AM and take 1 tablet (20 meq) in the PM as by mouth as directed)    pravastatin (PRAVACHOL) 20 MG tablet Take 1  tablet (20 mg total) by mouth at bedtime.    pregabalin (LYRICA) 75 MG capsule Take 75 mg by mouth 2 (two) times daily.    Probiotic Product (PROBIOTIC-10 PO) Take by mouth.    Semaglutide, 1 MG/DOSE, (OZEMPIC, 1 MG/DOSE,) 4 MG/3ML SOPN Inject 1 mg into the skin once a week.    Semaglutide,0.25 or 0.5MG /DOS, (OZEMPIC, 0.25 OR 0.5 MG/DOSE,) 2 MG/1.5ML SOPN Inject 1 mg into the skin once a week. 11/13/2020: 1.5 mg   sertraline (ZOLOFT) 50 MG tablet Take 1 tablet (50 mg total) by mouth daily.    TART CHERRY PO Take by mouth.    Turmeric 400 MG CAPS Take by mouth.    No facility-administered encounter medications on file as of 12/26/2020.    Reviewed chart for medication changes ahead of medication coordination call.  No OVs, Consults, or hospital visits since last care coordination call/Pharmacist visit. (If appropriate, list visit date, provider name)  No medication changes indicated OR if recent visit, treatment plan here.  BP Readings from Last 3 Encounters:  12/18/20 98/62  11/15/20 (!) 100/49  10/12/20 105/68    Lab Results  Component Value Date   HGBA1C 5.9 (H) 10/12/2020     Patient obtains medications through Adherence Packaging  90 Days   Last adherence delivery included:  Fish Oil 1200 mg Take two capsules every morning and take one capsule every evening Evening Primrose 1000 Take one capsule every morning Calcium Citrate 315 Take one tab every morning and take one tab every evening Glucosamine Sulf Dipotassium Cl 750 mg- Chondroitin Sulf 600 Mg Tab - Take one tab every morning and take one tab every evening Claritin 10 mg Tablet Take one tab every evening Tart Cherry 1200 mg Tab Take one tab every morning Acidophilus Probiotic Blend 175 mg Capsule Take one capsule every morning   Called patient and reviewed medications and coordinated delivery. Patient is not needing a medication delivery at this time . Patient is not due for medications until 02/2021.   Georgiana Shore ,[facility] Pharmacist Assistant [phone]

## 2020-12-27 DIAGNOSIS — M9901 Segmental and somatic dysfunction of cervical region: Secondary | ICD-10-CM | POA: Diagnosis not present

## 2020-12-27 DIAGNOSIS — M9902 Segmental and somatic dysfunction of thoracic region: Secondary | ICD-10-CM | POA: Diagnosis not present

## 2020-12-27 DIAGNOSIS — M545 Low back pain, unspecified: Secondary | ICD-10-CM | POA: Diagnosis not present

## 2020-12-27 DIAGNOSIS — M9907 Segmental and somatic dysfunction of upper extremity: Secondary | ICD-10-CM | POA: Diagnosis not present

## 2020-12-27 DIAGNOSIS — M9903 Segmental and somatic dysfunction of lumbar region: Secondary | ICD-10-CM | POA: Diagnosis not present

## 2020-12-27 DIAGNOSIS — M9904 Segmental and somatic dysfunction of sacral region: Secondary | ICD-10-CM | POA: Diagnosis not present

## 2020-12-27 DIAGNOSIS — M25511 Pain in right shoulder: Secondary | ICD-10-CM | POA: Diagnosis not present

## 2020-12-27 DIAGNOSIS — M9905 Segmental and somatic dysfunction of pelvic region: Secondary | ICD-10-CM | POA: Diagnosis not present

## 2020-12-27 DIAGNOSIS — M25551 Pain in right hip: Secondary | ICD-10-CM | POA: Diagnosis not present

## 2021-01-01 ENCOUNTER — Other Ambulatory Visit: Payer: Self-pay

## 2021-01-01 ENCOUNTER — Ambulatory Visit (INDEPENDENT_AMBULATORY_CARE_PROVIDER_SITE_OTHER): Payer: PPO | Admitting: Family Medicine

## 2021-01-01 ENCOUNTER — Encounter (INDEPENDENT_AMBULATORY_CARE_PROVIDER_SITE_OTHER): Payer: Self-pay | Admitting: Family Medicine

## 2021-01-01 VITALS — BP 105/66 | HR 66 | Temp 98.0°F | Ht 65.0 in | Wt 180.0 lb

## 2021-01-01 DIAGNOSIS — F411 Generalized anxiety disorder: Secondary | ICD-10-CM

## 2021-01-01 DIAGNOSIS — E8881 Metabolic syndrome: Secondary | ICD-10-CM

## 2021-01-01 DIAGNOSIS — E669 Obesity, unspecified: Secondary | ICD-10-CM

## 2021-01-01 DIAGNOSIS — K5903 Drug induced constipation: Secondary | ICD-10-CM | POA: Diagnosis not present

## 2021-01-01 DIAGNOSIS — Z6831 Body mass index (BMI) 31.0-31.9, adult: Secondary | ICD-10-CM

## 2021-01-04 ENCOUNTER — Other Ambulatory Visit: Payer: Self-pay | Admitting: Family Medicine

## 2021-01-08 MED ORDER — SERTRALINE HCL 50 MG PO TABS: 75.0000 mg | ORAL_TABLET | Freq: Every day | ORAL | 0 refills | Status: DC

## 2021-01-09 DIAGNOSIS — M9906 Segmental and somatic dysfunction of lower extremity: Secondary | ICD-10-CM | POA: Diagnosis not present

## 2021-01-09 DIAGNOSIS — M9902 Segmental and somatic dysfunction of thoracic region: Secondary | ICD-10-CM | POA: Diagnosis not present

## 2021-01-09 DIAGNOSIS — M5451 Vertebrogenic low back pain: Secondary | ICD-10-CM | POA: Diagnosis not present

## 2021-01-09 DIAGNOSIS — M9901 Segmental and somatic dysfunction of cervical region: Secondary | ICD-10-CM | POA: Diagnosis not present

## 2021-01-09 DIAGNOSIS — M654 Radial styloid tenosynovitis [de Quervain]: Secondary | ICD-10-CM | POA: Diagnosis not present

## 2021-01-09 DIAGNOSIS — M9908 Segmental and somatic dysfunction of rib cage: Secondary | ICD-10-CM | POA: Diagnosis not present

## 2021-01-09 DIAGNOSIS — G44209 Tension-type headache, unspecified, not intractable: Secondary | ICD-10-CM | POA: Diagnosis not present

## 2021-01-09 DIAGNOSIS — M9907 Segmental and somatic dysfunction of upper extremity: Secondary | ICD-10-CM | POA: Diagnosis not present

## 2021-01-09 DIAGNOSIS — M25512 Pain in left shoulder: Secondary | ICD-10-CM | POA: Diagnosis not present

## 2021-01-10 NOTE — Progress Notes (Signed)
Chief Complaint:   OBESITY Mary Hernandez is here to discuss her progress with her obesity treatment plan along with follow-up of her obesity related diagnoses.   Today's visit was #: 8 Starting weight: 192 lbs Starting date: 07/03/2020 Today's weight: 180 lbs Today's date: 01/01/2021 Total lbs lost to date: 12 lbs Body mass index is 29.95 kg/m.  Total weight loss percentage to date: -6.25%  Interim History:  Mary Hernandez says she has been hungry and tries to get protein in.  She endorses intermittent constipation.  She says that Zoloft 75 mg has been helping with her sleep.  Energy levels have been okay.  She [place]  Current Meal Plan: the Category 2 Plan for 75% of the time.  Current Exercise Plan: Walking/pilates/stretching for 20-60 minutes 5 times per week. Current Anti-Obesity Medications: Ozempic 1 mg subcutaneously weekly. Side effects: None.  Assessment/Plan:   1. Metabolic syndrome Starting goal: Lose 7-10% of starting weight. She will continue to focus on protein-rich, low simple carbohydrate foods. We reviewed the importance of hydration, regular exercise for stress reduction, and restorative sleep.  We will continue to check lab work every 3 months, with 10% weight loss, or should any other concerns arise.  2. Drug-induced constipation This problem is uncontrolled. Mary Hernandez was informed that a decrease in bowel movement frequency is normal while losing weight, but stools should not be hard or painful.  Counseling: Getting to Good Bowel Health: Your goal is to have one soft bowel movement each day. Drink at least 8 glasses of water each day. Eat plenty of fiber (goal is over 30 grams each day). It is best to get most of your fiber from dietary sources which includes leafy green vegetables, fresh fruit, and whole grains. You may need to add fiber with the help of OTC fiber supplements. These include Metamucil, Citrucel, and Benefiber. If you  are still having trouble, try adding an osmotic laxative such as Miralax. If all of these changes do not work, Mary Hernandez.   3. GAD (generalized anxiety disorder) Mary Hernandez is taking Zoloft 75 mg daily.  Plan:  Continue Zoloft.  Behavior modification techniques were discussed today to help Mary Hernandez deal with her anxiety.    4. Obesity, current BMI 30  Course: Mary Hernandez is currently in the action stage of change. As such, her goal is to continue with weight loss efforts.   Nutrition goals: She has agreed to the Category 1 Plan.   Exercise goals: As is.  Behavioral modification strategies: increasing lean protein intake, decreasing simple carbohydrates, increasing vegetables, increasing water intake and decreasing liquid calories.  Mary Hernandez has agreed to follow-up with our clinic in 3 weeks. She was informed of the importance of frequent follow-up visits to maximize her success with intensive lifestyle modifications for her multiple health conditions.   Objective:   Blood pressure 105/66, pulse 66, temperature 98 F (36.7 C), temperature source Oral, height 5\' 5"  (1.651 m), weight 180 lb (81.6 kg), SpO2 95 %. Body mass index is 29.95 kg/m.  General: Cooperative, alert, well developed, in no acute distress. HEENT: Conjunctivae and lids unremarkable. Cardiovascular: Regular rhythm.  Lungs: Normal work of breathing. Neurologic: No focal deficits.   Lab Results  Component Value Date   CREATININE 0.56 (L) 10/18/2020   BUN 15 10/18/2020   NA 140 10/18/2020   K 4.0 10/18/2020   CL 100 10/18/2020   CO2 24 10/18/2020   Lab Results  Component Value Date   ALT 18 10/12/2020   AST 17 10/12/2020   ALKPHOS 76 10/12/2020   BILITOT 0.2 10/12/2020   Lab Results  Component Value Date   HGBA1C 5.9 (H) 10/12/2020   HGBA1C 6.2 (H) 06/21/2020   HGBA1C 5.9 (A) 10/07/2019   HGBA1C 6.2 05/17/2019   HGBA1C 5.9 10/12/2018   Lab Results  Component Value Date   INSULIN 27.5 (H) 10/12/2020    INSULIN 21.9 07/03/2020   Lab Results  Component Value Date   TSH 0.557 10/12/2020   Lab Results  Component Value Date   CHOL 173 10/12/2020   HDL 57 10/12/2020   LDLCALC 90 10/12/2020   LDLDIRECT 94.0 05/01/2018   TRIG 149 10/12/2020   CHOLHDL 2.8 06/21/2020   Lab Results  Component Value Date   WBC 6.0 06/21/2020   HGB 13.7 06/21/2020   HCT 41.2 06/21/2020   MCV 91.6 06/21/2020   PLT 223 06/21/2020   Obesity Behavioral Intervention:   Approximately 15 minutes were spent on the discussion below.  ASK: We discussed the diagnosis of obesity with Edd Fabian today and Neda agreed to give [place] permission to discuss obesity behavioral modification therapy today.  ASSESS: Marvelle has the diagnosis of obesity and her BMI today is 30.0. Dierdre is in the action stage of change.   ADVISE: Aliesha was educated on the multiple health risks of obesity as well as the benefit of weight loss to improve her health. She was advised of the need for long term treatment and the importance of lifestyle modifications to improve her current health and to decrease her risk of future health problems.  AGREE: Multiple dietary modification options and treatment options were discussed and Jaleisa agreed to follow the recommendations documented in the above note.  ARRANGE: Tailyn was educated on the importance of frequent visits to treat obesity as outlined per CMS and USPSTF guidelines and agreed to schedule her next follow up appointment today.  Attestation Statements:   Reviewed by clinician on day of visit: allergies, medications, problem list, medical history, surgical history, family history, social history, and previous encounter notes.  I, Water quality scientist, CMA, am acting as transcriptionist for [name], DO  I have reviewed the above documentation for accuracy and completeness, and I agree with the above. [name], DO

## 2021-01-25 ENCOUNTER — Encounter: Payer: Self-pay | Admitting: Family Medicine

## 2021-01-26 DIAGNOSIS — M542 Cervicalgia: Secondary | ICD-10-CM | POA: Diagnosis not present

## 2021-01-26 DIAGNOSIS — M545 Low back pain, unspecified: Secondary | ICD-10-CM | POA: Diagnosis not present

## 2021-01-26 DIAGNOSIS — M9905 Segmental and somatic dysfunction of pelvic region: Secondary | ICD-10-CM | POA: Diagnosis not present

## 2021-01-26 DIAGNOSIS — M9906 Segmental and somatic dysfunction of lower extremity: Secondary | ICD-10-CM | POA: Diagnosis not present

## 2021-01-26 DIAGNOSIS — M9902 Segmental and somatic dysfunction of thoracic region: Secondary | ICD-10-CM | POA: Diagnosis not present

## 2021-01-26 DIAGNOSIS — G4486 Cervicogenic headache: Secondary | ICD-10-CM | POA: Diagnosis not present

## 2021-01-26 DIAGNOSIS — M25551 Pain in right hip: Secondary | ICD-10-CM | POA: Diagnosis not present

## 2021-01-26 DIAGNOSIS — M9901 Segmental and somatic dysfunction of cervical region: Secondary | ICD-10-CM | POA: Diagnosis not present

## 2021-01-26 DIAGNOSIS — M9903 Segmental and somatic dysfunction of lumbar region: Secondary | ICD-10-CM | POA: Diagnosis not present

## 2021-01-26 DIAGNOSIS — M9904 Segmental and somatic dysfunction of sacral region: Secondary | ICD-10-CM | POA: Diagnosis not present

## 2021-01-29 ENCOUNTER — Other Ambulatory Visit: Payer: Self-pay

## 2021-01-29 ENCOUNTER — Ambulatory Visit (INDEPENDENT_AMBULATORY_CARE_PROVIDER_SITE_OTHER): Payer: PPO | Admitting: Family Medicine

## 2021-01-29 ENCOUNTER — Encounter (INDEPENDENT_AMBULATORY_CARE_PROVIDER_SITE_OTHER): Payer: Self-pay | Admitting: Family Medicine

## 2021-01-29 VITALS — BP 101/65 | HR 62 | Temp 97.7°F | Ht 65.0 in | Wt 176.0 lb

## 2021-01-29 DIAGNOSIS — E669 Obesity, unspecified: Secondary | ICD-10-CM | POA: Diagnosis not present

## 2021-01-29 DIAGNOSIS — G8929 Other chronic pain: Secondary | ICD-10-CM

## 2021-01-29 DIAGNOSIS — E1169 Type 2 diabetes mellitus with other specified complication: Secondary | ICD-10-CM

## 2021-01-29 DIAGNOSIS — R7303 Prediabetes: Secondary | ICD-10-CM

## 2021-01-29 DIAGNOSIS — E038 Other specified hypothyroidism: Secondary | ICD-10-CM | POA: Diagnosis not present

## 2021-01-29 DIAGNOSIS — Z6831 Body mass index (BMI) 31.0-31.9, adult: Secondary | ICD-10-CM

## 2021-01-30 ENCOUNTER — Encounter (INDEPENDENT_AMBULATORY_CARE_PROVIDER_SITE_OTHER): Payer: Self-pay | Admitting: Family Medicine

## 2021-01-30 LAB — COMPREHENSIVE METABOLIC PANEL
ALT: 20 IU/L (ref 0–32)
AST: 17 IU/L (ref 0–40)
Albumin/Globulin Ratio: 1.5 (ref 1.2–2.2)
Albumin: 4.4 g/dL (ref 3.8–4.8)
Alkaline Phosphatase: 64 IU/L (ref 44–121)
BUN/Creatinine Ratio: 30 — ABNORMAL HIGH (ref 12–28)
BUN: 17 mg/dL (ref 8–27)
Bilirubin Total: 0.3 mg/dL (ref 0.0–1.2)
CO2: 21 mmol/L (ref 20–29)
Calcium: 9.1 mg/dL (ref 8.7–10.3)
Chloride: 101 mmol/L (ref 96–106)
Creatinine, Ser: 0.56 mg/dL — ABNORMAL LOW (ref 0.57–1.00)
Globulin, Total: 2.9 g/dL (ref 1.5–4.5)
Glucose: 84 mg/dL (ref 65–99)
Potassium: 3.8 mmol/L (ref 3.5–5.2)
Sodium: 141 mmol/L (ref 134–144)
Total Protein: 7.3 g/dL (ref 6.0–8.5)
eGFR: 99 mL/min/{1.73_m2} (ref >59)

## 2021-01-30 LAB — HEMOGLOBIN A1C
Est. average glucose Bld gHb Est-mCnc: 126 mg/dL
Hgb A1c MFr Bld: 6 % — ABNORMAL HIGH (ref 4.8–5.6)

## 2021-01-30 MED ORDER — UNABLE TO FIND: 2.0000 mg | 0 refills | Status: DC

## 2021-01-31 ENCOUNTER — Encounter: Payer: Self-pay | Admitting: Family Medicine

## 2021-01-31 DIAGNOSIS — L918 Other hypertrophic disorders of the skin: Secondary | ICD-10-CM | POA: Diagnosis not present

## 2021-01-31 DIAGNOSIS — L820 Inflamed seborrheic keratosis: Secondary | ICD-10-CM | POA: Diagnosis not present

## 2021-01-31 NOTE — Progress Notes (Addendum)
Chief Complaint:   OBESITY Mary Hernandez is here to discuss her progress with her obesity treatment plan along with follow-up of her obesity related diagnoses.   Today's visit was #: 9 Starting weight: 192 lbs Starting date: 07/03/2020 Today's weight: 176 lbs Today's date: 01/29/2021 Total lbs lost to date: 16 lbs Body mass index is 29.29 kg/m.  Total weight loss percentage to date: -8.33%  Interim History:  [name] has been doing more walking, she says. Current Meal Plan: the Category 1 Plan for 70% of the time.  Current Exercise Plan: Pilates/walking for 20-60 minutes 5-6 times per week. Current Anti-Obesity Medications: Ozempic 1.5 mg subcutaneously weekly. Side effects: None.  Assessment/Plan:   1. Prediabetes Medication: Ozempic 1.5 mg subcutaneously weekly. Issues reviewed: blood sugar goals, complications of diabetes mellitus, hypoglycemia prevention and treatment, exercise, and nutrition.   Plan:  Increase Ozempic to 2 mg subcutaneously weekly.    Lab Results  Component Value Date   HGBA1C 6.0 (H) 01/29/2021   HGBA1C 5.9 (H) 10/12/2020   HGBA1C 6.2 (H) 06/21/2020   Lab Results  Component Value Date   MICROALBUR <0.7 05/01/2018   LDLCALC 90 10/12/2020   CREATININE 0.56 (L) 01/29/2021   - Hemoglobin A1c - Comprehensive metabolic panel - Increase UNABLE TO FIND; Inject 2 mg into the skin once a week. Med Name: Ozempic 2 mg dose (8 mg/3 mL subcutaneous pen injector)  Dispense: 9 mL; Refill: 0  2. Other specified hypothyroidism Course: Stabel. Medication: Synthroid 112 mcg daily.   Plan: Patient was instructed not to take MVM or iron within 4 hours of taking thyroid medications. This issue is managed by Dr. Jonni Sanger. We will continue to monitor alongside Endocrinology/PCP as it relates to her weight loss journey.   Lab Results  Component Value Date   TSH 0.557 10/12/2020   3. Other chronic pain We will continue to monitor as it relates to her weight loss  journey.  4. Obesity, current BMI 29.3  Course: [name] is currently in the action stage of change. As such, her goal is to continue with weight loss efforts.   Nutrition goals: She has agreed to the Category 1 Plan.   Exercise goals: As is.  Behavioral modification strategies: increasing lean protein intake, decreasing simple carbohydrates, increasing vegetables and increasing water intake.  [name] has agreed to follow-up with our clinic in 2-3 weeks. She was informed of the importance of frequent follow-up visits to maximize her success with intensive lifestyle modifications for her multiple health conditions.   Objective:   Blood pressure 101/65, pulse 62, temperature 97.7 F (36.5 C), temperature source Oral, height 5\' 5"  (1.651 m), weight 176 lb (79.8 kg), SpO2 98 %. Body mass index is 29.29 kg/m.  General: Cooperative, alert, well developed, in no acute distress. HEENT: Conjunctivae and lids unremarkable. Cardiovascular: Regular rhythm.  Lungs: Normal work of breathing. Neurologic: No focal deficits.   Lab Results  Component Value Date   CREATININE 0.56 (L) 01/29/2021   BUN 17 01/29/2021   NA 141 01/29/2021   K 3.8 01/29/2021   CL 101 01/29/2021   CO2 21 01/29/2021   Lab Results  Component Value Date   ALT 20 01/29/2021   AST 17 01/29/2021   ALKPHOS 64 01/29/2021   BILITOT 0.3 01/29/2021   Lab Results  Component Value Date   HGBA1C 6.0 (H) 01/29/2021   HGBA1C 5.9 (H) 10/12/2020   HGBA1C 6.2 (H) 06/21/2020   HGBA1C 5.9 (A) 10/07/2019   HGBA1C 6.2  05/17/2019   Lab Results  Component Value Date   INSULIN 27.5 (H) 10/12/2020   INSULIN 21.9 07/03/2020   Lab Results  Component Value Date   TSH 0.557 10/12/2020   Lab Results  Component Value Date   CHOL 173 10/12/2020   HDL 57 10/12/2020   LDLCALC 90 10/12/2020   LDLDIRECT 94.0 05/01/2018   TRIG 149 10/12/2020   CHOLHDL 2.8 06/21/2020   Lab Results  Component Value Date   WBC 6.0 06/21/2020   HGB  13.7 06/21/2020   HCT 41.2 06/21/2020   MCV 91.6 06/21/2020   PLT 223 06/21/2020   Obesity Behavioral Intervention:   Approximately 15 minutes were spent on the discussion below.  ASK: We discussed the diagnosis of obesity with Edd Fabian today and Penelope agreed to give [place] permission to discuss obesity behavioral modification therapy today.  ASSESS: [name] has the diagnosis of obesity and her BMI today is 29.3. [name] is in the action stage of change.   ADVISE: [name] was educated on the multiple health risks of obesity as well as the benefit of weight loss to improve her health. She was advised of the need for long term treatment and the importance of lifestyle modifications to improve her current health and to decrease her risk of future health problems.  AGREE: Multiple dietary modification options and treatment options were discussed and [name] agreed to follow the recommendations documented in the above note.  ARRANGE: [name] was educated on the importance of frequent visits to treat obesity as outlined per CMS and USPSTF guidelines and agreed to schedule her next follow up appointment today.  Attestation Statements:   Reviewed by clinician on day of visit: allergies, medications, problem list, medical history, surgical history, family history, social history, and previous encounter notes.  I, Water quality scientist, CMA, am acting as transcriptionist for [name], DO  I have reviewed the above documentation for accuracy and completeness, and I agree with the above. [name], DO

## 2021-02-01 ENCOUNTER — Other Ambulatory Visit: Payer: Self-pay

## 2021-02-01 DIAGNOSIS — R42 Dizziness and giddiness: Secondary | ICD-10-CM

## 2021-02-01 MED ORDER — GLYCOPYRROLATE 1 MG PO TABS: 1.0000 mg | ORAL_TABLET | Freq: Two times a day (BID) | ORAL | 1 refills | Status: DC

## 2021-02-01 MED ORDER — PRAVASTATIN SODIUM 20 MG PO TABS
20.0000 mg | ORAL_TABLET | Freq: Every day | ORAL | 3 refills | Status: DC
Start: 2021-02-01 — End: 2021-07-17

## 2021-02-05 ENCOUNTER — Encounter (INDEPENDENT_AMBULATORY_CARE_PROVIDER_SITE_OTHER): Payer: Self-pay | Admitting: Family Medicine

## 2021-02-05 DIAGNOSIS — E8881 Metabolic syndrome: Secondary | ICD-10-CM

## 2021-02-06 NOTE — Telephone Encounter (Signed)
Pt last seen by Dr. Wallace.  

## 2021-02-09 DIAGNOSIS — M9906 Segmental and somatic dysfunction of lower extremity: Secondary | ICD-10-CM | POA: Diagnosis not present

## 2021-02-09 DIAGNOSIS — M542 Cervicalgia: Secondary | ICD-10-CM | POA: Diagnosis not present

## 2021-02-09 DIAGNOSIS — G44209 Tension-type headache, unspecified, not intractable: Secondary | ICD-10-CM | POA: Diagnosis not present

## 2021-02-09 DIAGNOSIS — M9904 Segmental and somatic dysfunction of sacral region: Secondary | ICD-10-CM | POA: Diagnosis not present

## 2021-02-09 DIAGNOSIS — M9905 Segmental and somatic dysfunction of pelvic region: Secondary | ICD-10-CM | POA: Diagnosis not present

## 2021-02-09 DIAGNOSIS — M25551 Pain in right hip: Secondary | ICD-10-CM | POA: Diagnosis not present

## 2021-02-09 DIAGNOSIS — M9901 Segmental and somatic dysfunction of cervical region: Secondary | ICD-10-CM | POA: Diagnosis not present

## 2021-02-09 DIAGNOSIS — M9902 Segmental and somatic dysfunction of thoracic region: Secondary | ICD-10-CM | POA: Diagnosis not present

## 2021-02-09 DIAGNOSIS — M9903 Segmental and somatic dysfunction of lumbar region: Secondary | ICD-10-CM | POA: Diagnosis not present

## 2021-02-12 NOTE — Telephone Encounter (Signed)
Pt last seen by Dr. Wallace.  

## 2021-02-14 MED ORDER — OZEMPIC (1 MG/DOSE) 4 MG/3ML ~~LOC~~ SOPN: 1.0000 mg | PEN_INJECTOR | SUBCUTANEOUS | 0 refills | Status: DC

## 2021-02-14 NOTE — Addendum Note (Signed)
Addended by: Karren Cobble on: 02/14/2021 08:25 AM   Modules accepted: Orders

## 2021-02-14 NOTE — Addendum Note (Signed)
Addended by: Karren Cobble on: 02/14/2021 09:21 AM   Modules accepted: Orders

## 2021-02-19 ENCOUNTER — Other Ambulatory Visit: Payer: Self-pay

## 2021-02-19 ENCOUNTER — Encounter (INDEPENDENT_AMBULATORY_CARE_PROVIDER_SITE_OTHER): Payer: Self-pay | Admitting: Family Medicine

## 2021-02-19 ENCOUNTER — Ambulatory Visit (INDEPENDENT_AMBULATORY_CARE_PROVIDER_SITE_OTHER): Payer: PPO | Admitting: Family Medicine

## 2021-02-19 VITALS — BP 90/57 | HR 65 | Temp 98.0°F | Ht 65.0 in | Wt 176.0 lb

## 2021-02-19 DIAGNOSIS — H8109 Meniere's disease, unspecified ear: Secondary | ICD-10-CM

## 2021-02-19 DIAGNOSIS — R42 Dizziness and giddiness: Secondary | ICD-10-CM | POA: Diagnosis not present

## 2021-02-19 DIAGNOSIS — E8881 Metabolic syndrome: Secondary | ICD-10-CM | POA: Diagnosis not present

## 2021-02-19 DIAGNOSIS — E669 Obesity, unspecified: Secondary | ICD-10-CM

## 2021-02-19 DIAGNOSIS — Z6831 Body mass index (BMI) 31.0-31.9, adult: Secondary | ICD-10-CM

## 2021-02-19 DIAGNOSIS — M797 Fibromyalgia: Secondary | ICD-10-CM | POA: Diagnosis not present

## 2021-02-19 MED ORDER — HYDROCHLOROTHIAZIDE 12.5 MG PO CAPS: 12.5000 mg | ORAL_CAPSULE | Freq: Every day | ORAL | 3 refills | Status: DC

## 2021-02-19 NOTE — Progress Notes (Signed)
Chief Complaint:   OBESITY Mary Hernandez is here to discuss her progress with her obesity treatment plan along with follow-up of her obesity related diagnoses.   Today's visit was #: 10 Starting weight: 192 lbs Starting date: 07/03/2020 Total lbs lost to date: 16 lbs Total weight loss percentage to date: -8.33%  Interim History: Mary Hernandez says she has had lots of celebrations since our last visit.  She has increased exercise at the [facility]. Nutrition Plan: Category 1 Plan for 50% of the time. Anti-obesity medications: Ozempic 1 mg weekly. Reported side effects: None. Activity: Pilates/walking/water aerobics for 20-60 minutes 6-7 times per week.  Assessment/Plan:   1. Vertigo With low blood pressure. She is currently taking HCTZ 12.5 mg (half 25 mg tab) for Meniere's. Plan:  Start HCTZ 12.5 mg daily, with instructions to take 1/2 tab. She will monitor her blood pressure and vertigo.  2. Metabolic syndrome Starting goal: Lose 7-10% of starting weight. She will continue to focus on protein-rich, low simple carbohydrate foods. We reviewed the importance of hydration, regular exercise for stress reduction, and restorative sleep.  We will continue to check lab work every 3 months, with 10% weight loss, or should any other concerns arise.  3. Fibromyalgia Mary Hernandez is followed by Sports Medicine.  We will continue to monitor symptoms as they relate to her weight loss journey.   4. Obesity, current BMI 29.4  Course: Mary Hernandez is currently in the action stage of change. As such, her goal is to continue with weight loss efforts.   Nutrition goals: She has agreed to the Category 1 Plan.   Exercise goals: As is.  Behavioral modification strategies: increasing lean protein intake, decreasing simple carbohydrates, increasing vegetables and increasing water intake.  Mary Hernandez has agreed to follow-up with our clinic in 4 weeks. She was informed of the importance of frequent follow-up visits to maximize her success  with intensive lifestyle modifications for her multiple health conditions.   Objective:   Blood pressure (!) 90/57, pulse 65, temperature 98 F (36.7 C), temperature source Oral, height 5\' 5"  (1.651 m), weight 176 lb (79.8 kg), SpO2 95 %. Body mass index is 29.29 kg/m.  General: Cooperative, alert, well developed, in no acute distress. HEENT: Conjunctivae and lids unremarkable. Cardiovascular: Regular rhythm.  Lungs: Normal work of breathing. Neurologic: No focal deficits.   Lab Results  Component Value Date   CREATININE 0.56 (L) 01/29/2021   BUN 17 01/29/2021   NA 141 01/29/2021   K 3.8 01/29/2021   CL 101 01/29/2021   CO2 21 01/29/2021   Lab Results  Component Value Date   ALT 20 01/29/2021   AST 17 01/29/2021   ALKPHOS 64 01/29/2021   BILITOT 0.3 01/29/2021   Lab Results  Component Value Date   HGBA1C 6.0 (H) 01/29/2021   HGBA1C 5.9 (H) 10/12/2020   HGBA1C 6.2 (H) 06/21/2020   HGBA1C 5.9 (A) 10/07/2019   HGBA1C 6.2 05/17/2019   Lab Results  Component Value Date   INSULIN 27.5 (H) 10/12/2020   INSULIN 21.9 07/03/2020   Lab Results  Component Value Date   TSH 0.557 10/12/2020   Lab Results  Component Value Date   CHOL 173 10/12/2020   HDL 57 10/12/2020   LDLCALC 90 10/12/2020   LDLDIRECT 94.0 05/01/2018   TRIG 149 10/12/2020   CHOLHDL 2.8 06/21/2020   Lab Results  Component Value Date   WBC 6.0 06/21/2020   HGB 13.7 06/21/2020   HCT 41.2 06/21/2020   MCV  91.6 06/21/2020   PLT 223 06/21/2020   Obesity Behavioral Intervention:   Approximately 15 minutes were spent on the discussion below.  ASK: We discussed the diagnosis of obesity with Mary Hernandez today and Mary Hernandez agreed to give [place] permission to discuss obesity behavioral modification therapy today.  ASSESS: Mary Hernandez has the diagnosis of obesity and her BMI today is 29.4. [name] is in the action stage of change.   ADVISE: Mary Hernandez was educated on the multiple health risks of obesity as well as the benefit  of weight loss to improve her health. She was advised of the need for long term treatment and the importance of lifestyle modifications to improve her current health and to decrease her risk of future health problems.  AGREE: Multiple dietary modification options and treatment options were discussed and Mary Hernandez agreed to follow the recommendations documented in the above note.  ARRANGE: Mary Hernandez was educated on the importance of frequent visits to treat obesity as outlined per CMS and USPSTF guidelines and agreed to schedule her next follow up appointment today.  Attestation Statements:   Reviewed by clinician on day of visit: allergies, medications, problem list, medical history, surgical history, family history, social history, and previous encounter notes.  I, Water quality scientist, CMA, am acting as transcriptionist for [name], DO  I have reviewed the above documentation for accuracy and completeness, and I agree with the above. [name], DO

## 2021-02-20 ENCOUNTER — Telehealth: Payer: Self-pay

## 2021-02-20 DIAGNOSIS — M9902 Segmental and somatic dysfunction of thoracic region: Secondary | ICD-10-CM | POA: Diagnosis not present

## 2021-02-20 DIAGNOSIS — G4485 Primary stabbing headache: Secondary | ICD-10-CM | POA: Diagnosis not present

## 2021-02-20 DIAGNOSIS — M9908 Segmental and somatic dysfunction of rib cage: Secondary | ICD-10-CM | POA: Diagnosis not present

## 2021-02-20 DIAGNOSIS — M9907 Segmental and somatic dysfunction of upper extremity: Secondary | ICD-10-CM | POA: Diagnosis not present

## 2021-02-20 DIAGNOSIS — G4486 Cervicogenic headache: Secondary | ICD-10-CM | POA: Diagnosis not present

## 2021-02-20 DIAGNOSIS — M9901 Segmental and somatic dysfunction of cervical region: Secondary | ICD-10-CM | POA: Diagnosis not present

## 2021-02-20 NOTE — Chronic Care Management (AMB) (Signed)
Chronic Care Management Pharmacy Assistant   Name: Mary Hernandez  MRN: [id number] DOB: 02/15/1951  Reason for Encounter: Medication Coordination Call  Recent office visits:  02/20/21- [name], DO- seen for obesity treatment plan, no medication changes, follow up 4 weeks 01/29/21- [name], DO- seen for obesity treatment plan, increased Ozempic from 1.5 mg to 2 mg weekly 01/01/21- [name], DO- seen for obesity treatment plan,no medication changes, follow up 3 weeks  Recent consult visits:  No visits noted  Hospital visits:  None in previous 6 months  Medications: Outpatient Encounter Medications as of 02/20/2021  Medication Sig  . acebutolol (SECTRAL) 200 MG capsule TAKE ONE CAPSULE BY MOUTH DAILY  . Calcium Citrate-Vitamin D 315-250 MG-UNIT TABS Take by mouth.  . cetirizine (ZYRTEC) 10 MG tablet Take 10 mg by mouth daily.  . diclofenac Sodium (VOLTAREN) 1 % GEL Apply 2 g topically 4 (four) times daily.  Marland Kitchen EVENING PRIMROSE OIL PO Take 1,500 mg by mouth 2 (two) times daily.  . flecainide (TAMBOCOR) 50 MG tablet Take 1.5 tablets (75 mg total) by mouth 2 (two) times daily.  . fluticasone (FLONASE) 50 MCG/ACT nasal spray Place 2 sprays into both nostrils daily.  . Glucosamine-Chondroit-Vit C-Mn (GLUCOSAMINE 1500 COMPLEX) CAPS Take by mouth.  Marland Kitchen glycopyrrolate (ROBINUL) 1 MG tablet Take 1 tablet (1 mg total) by mouth 2 (two) times daily.  . hydrochlorothiazide (MICROZIDE) 12.5 MG capsule Take 1 capsule (12.5 mg total) by mouth daily.  Marland Kitchen levothyroxine (SYNTHROID) 112 MCG tablet TAKE ONE TABLET BY MOUTH DAILY  . naltrexone (DEPADE) 50 MG tablet Take 50 mg by mouth daily. 1/2 tablet am for 1 week, then 1/2 am and 1/2 pm after 1 week  . omega-3 acid ethyl esters (LOVAZA) 1 g capsule Take 1 g by mouth 2 (two) times daily.  . ondansetron (ZOFRAN-ODT) 4 MG disintegrating tablet Take 4 mg by mouth every 8 (eight) hours as needed for nausea or vomiting.  . Potassium  Chloride ER 20 MEQ TBCR Take 2 tablets (40 meq) in the AM and take 1 tablet (20 meq) in the PM as by mouth as directed (Patient taking differently: Take 20 mEq by mouth daily. Take 2 tablets (40 meq) in the AM and take 1 tablet (20 meq) in the PM as by mouth as directed)  . pravastatin (PRAVACHOL) 20 MG tablet Take 1 tablet (20 mg total) by mouth at bedtime.  . pregabalin (LYRICA) 75 MG capsule Take 75 mg by mouth 2 (two) times daily.  . Probiotic Product (PROBIOTIC-10 PO) Take by mouth.  . Semaglutide, 1 MG/DOSE, (OZEMPIC, 1 MG/DOSE,) 4 MG/3ML SOPN Inject 1 mg into the skin once a week.  . sertraline (ZOLOFT) 50 MG tablet Take 1.5 tablets (75 mg total) by mouth daily.  Marland Kitchen TART CHERRY PO Take by mouth.  . Turmeric 400 MG CAPS Take by mouth.  Marland Kitchen UNABLE TO FIND Inject 2 mg into the skin once a week. Med Name: Ozempic 2 mg dose (8 mg/3 mL subcutaneous pen injector)   No facility-administered encounter medications on file as of 02/20/2021.    Reviewed chart for medication changes ahead of medication coordination call.  No OVs, Consults, or hospital visits since last care coordination call/Pharmacist visit. (If appropriate, list visit date, provider name)  No medication changes indicated OR if recent visit, treatment plan here.  BP Readings from Last 3 Encounters:  02/19/21 (!) 90/57  01/29/21 101/65  01/01/21 105/66    Lab Results  Component Value Date   HGBA1C 6.0 (H) 01/29/2021     Patient obtains medications through Adherence Packaging  90 Days   Last adherence delivery included:  Fish Oil 1200 mg Take two capsules every morning and take one capsule every evening Evening Primrose 1000 Take one capsule every morning Calcium Citrate 315 Take one tab every morning and take one tab every evening Glucosamine Sulf Dipotassium Cl 750 mg- Chondroitin Sulf 600 Mg Tab - Take one tab every morning and take one tab every evening Claritin 10 mg Tablet Take one tab every evening Tart Cherry 1200  mg Tab Take one tab every morning Acidophilus Probiotic Blend 175 mg Capsule Take one capsule every morning  Patient is due for next adherence delivery on: 03/02/21 Called patient and reviewed medications and coordinated delivery.  This delivery to include: Fish Oil 1200 mg Take two capsules every morning and take one capsule every evening Evening Primrose 1000 Take one capsule every morning Calcium Citrate 315 Take one tab every morning and take one tab every evening Glucosamine Sulf Dipotassium Cl 750 mg- Chondroitin Sulf 600 Mg Tab - Take one tab every morning and take one tab every evening Claritin 10 mg Tablet Take one tab every evening Tart Cherry 1200 mg Tab Take one tab every morning Acidophilus Probiotic Blend 175 mg Capsule Take one capsule every morning Patient will need a short fill of (med), prior to adherence delivery. (To align with sync date or if PRN med)   Confirmed delivery date of 03/02/21, advised patient that pharmacy will contact them the morning of delivery.  [facility] CPA,CMA

## 2021-02-20 NOTE — Telephone Encounter (Signed)
Pt last seen by Dr. Wallace.  

## 2021-02-21 MED ORDER — HYDROCHLOROTHIAZIDE 12.5 MG PO TABS: 12.5000 mg | ORAL_TABLET | Freq: Every day | ORAL | 0 refills | Status: DC

## 2021-02-21 NOTE — Addendum Note (Signed)
Addended by: Karren Cobble on: 02/21/2021 10:23 AM   Modules accepted: Orders

## 2021-02-23 DIAGNOSIS — G4485 Primary stabbing headache: Secondary | ICD-10-CM | POA: Diagnosis not present

## 2021-02-23 DIAGNOSIS — M65341 Trigger finger, right ring finger: Secondary | ICD-10-CM | POA: Diagnosis not present

## 2021-02-23 DIAGNOSIS — G4486 Cervicogenic headache: Secondary | ICD-10-CM | POA: Diagnosis not present

## 2021-02-23 DIAGNOSIS — M9901 Segmental and somatic dysfunction of cervical region: Secondary | ICD-10-CM | POA: Diagnosis not present

## 2021-02-23 DIAGNOSIS — M9902 Segmental and somatic dysfunction of thoracic region: Secondary | ICD-10-CM | POA: Diagnosis not present

## 2021-02-23 DIAGNOSIS — M9908 Segmental and somatic dysfunction of rib cage: Secondary | ICD-10-CM | POA: Diagnosis not present

## 2021-02-23 DIAGNOSIS — M9907 Segmental and somatic dysfunction of upper extremity: Secondary | ICD-10-CM | POA: Diagnosis not present

## 2021-02-27 MED ORDER — ACEBUTOLOL HCL 200 MG PO CAPS
200.0000 mg | ORAL_CAPSULE | Freq: Every day | ORAL | 2 refills | Status: DC
Start: 2021-02-27 — End: 2021-11-22

## 2021-03-12 ENCOUNTER — Other Ambulatory Visit: Payer: Self-pay

## 2021-03-12 ENCOUNTER — Encounter (INDEPENDENT_AMBULATORY_CARE_PROVIDER_SITE_OTHER): Payer: Self-pay | Admitting: Family Medicine

## 2021-03-12 ENCOUNTER — Ambulatory Visit (INDEPENDENT_AMBULATORY_CARE_PROVIDER_SITE_OTHER): Payer: PPO | Admitting: Family Medicine

## 2021-03-12 VITALS — BP 98/57 | HR 70 | Temp 98.1°F | Ht 65.0 in | Wt 176.0 lb

## 2021-03-12 DIAGNOSIS — Z6831 Body mass index (BMI) 31.0-31.9, adult: Secondary | ICD-10-CM | POA: Diagnosis not present

## 2021-03-12 DIAGNOSIS — E8881 Metabolic syndrome: Secondary | ICD-10-CM

## 2021-03-12 DIAGNOSIS — M797 Fibromyalgia: Secondary | ICD-10-CM

## 2021-03-12 DIAGNOSIS — E669 Obesity, unspecified: Secondary | ICD-10-CM | POA: Diagnosis not present

## 2021-03-12 DIAGNOSIS — F411 Generalized anxiety disorder: Secondary | ICD-10-CM | POA: Diagnosis not present

## 2021-03-12 MED ORDER — OZEMPIC (2 MG/DOSE) 8 MG/3ML ~~LOC~~ SOPN: 2.0000 mg | PEN_INJECTOR | SUBCUTANEOUS | 0 refills | Status: DC

## 2021-03-14 ENCOUNTER — Encounter (INDEPENDENT_AMBULATORY_CARE_PROVIDER_SITE_OTHER): Payer: Self-pay | Admitting: Family Medicine

## 2021-03-14 DIAGNOSIS — M9906 Segmental and somatic dysfunction of lower extremity: Secondary | ICD-10-CM | POA: Diagnosis not present

## 2021-03-14 DIAGNOSIS — M542 Cervicalgia: Secondary | ICD-10-CM | POA: Diagnosis not present

## 2021-03-14 DIAGNOSIS — G4486 Cervicogenic headache: Secondary | ICD-10-CM | POA: Diagnosis not present

## 2021-03-14 DIAGNOSIS — M9904 Segmental and somatic dysfunction of sacral region: Secondary | ICD-10-CM | POA: Diagnosis not present

## 2021-03-14 DIAGNOSIS — M545 Low back pain, unspecified: Secondary | ICD-10-CM | POA: Diagnosis not present

## 2021-03-14 DIAGNOSIS — M79672 Pain in left foot: Secondary | ICD-10-CM | POA: Diagnosis not present

## 2021-03-14 DIAGNOSIS — M9901 Segmental and somatic dysfunction of cervical region: Secondary | ICD-10-CM | POA: Diagnosis not present

## 2021-03-14 DIAGNOSIS — M9903 Segmental and somatic dysfunction of lumbar region: Secondary | ICD-10-CM | POA: Diagnosis not present

## 2021-03-14 DIAGNOSIS — M9902 Segmental and somatic dysfunction of thoracic region: Secondary | ICD-10-CM | POA: Diagnosis not present

## 2021-03-14 DIAGNOSIS — M25551 Pain in right hip: Secondary | ICD-10-CM | POA: Diagnosis not present

## 2021-03-14 DIAGNOSIS — M9905 Segmental and somatic dysfunction of pelvic region: Secondary | ICD-10-CM | POA: Diagnosis not present

## 2021-03-20 ENCOUNTER — Other Ambulatory Visit (INDEPENDENT_AMBULATORY_CARE_PROVIDER_SITE_OTHER): Payer: Self-pay | Admitting: Family Medicine

## 2021-03-20 NOTE — Progress Notes (Signed)
Chief Complaint:   OBESITY Mary Hernandez is here to discuss her progress with her obesity treatment plan along with follow-up of her obesity related diagnoses.   Today's visit was #: 11 Starting weight: 192 lbs Starting date: 07/03/2020 Today's weight: 176 lbs Today's date: 03/12/2021 Weight change since last visit: 0 Total lbs lost to date: 16 lbs Body mass index is 29.29 kg/m.  Total weight loss percentage to date: -8.33%  Interim History: Mary Hernandez has gained 1 pound of muscle.  She has been walking for 25-30 minutes most days, swimming once a week and going to pilates once a week.  She is traveling to [place] soon.  Current Meal Plan: the Category 2 Plan for 50% of the time.  Current Exercise Plan: Swimming/pilates/walking for 30-60 minutes 5-7 times per week. Current Anti-Obesity Medications: Ozempic 1 mg subcutaneously weekly. Side effects: None.  Assessment/Plan:   Meds ordered this encounter  Medications   Semaglutide, 2 MG/DOSE, (OZEMPIC, 2 MG/DOSE,) 8 MG/3ML SOPN    Sig: Inject 2 mg into the skin once a week.    Dispense:  3 mL    Refill:  0    1. Metabolic syndrome Starting goal: Lose 7-10% of starting weight. She will continue to focus on protein-rich, low simple carbohydrate foods. We reviewed the importance of hydration, regular exercise for stress reduction, and restorative sleep.  We will continue to check lab work every 3 months, with 10% weight loss, or should any other concerns arise.  Plan:  Increase Ozempic to 2 mg subcutaneously weekly, as per below.  - Increase and refill Semaglutide, 2 MG/DOSE, (OZEMPIC, 2 MG/DOSE,) 8 MG/3ML SOPN; Inject 2 mg into the skin once a week.  Dispense: 3 mL; Refill: 0  2. Fibromyalgia Intensive lifestyle modifications are the first line treatment for this issue. We discussed several lifestyle modifications today and she will continue to work on diet, exercise and weight loss efforts.We will continue to monitor.   Counseling Try  [email or web address]-[email or web address]/, which is a series of self-care modules designed to teach patients several techniques to manage pain.   3. GAD (generalized anxiety disorder) Mary Hernandez is taking Zoloft 75 mg daily.  Plan:  The current medical regimen is effective;  continue present plan and medications. Behavior modification techniques were discussed today to help Mary Hernandez deal with her anxiety.    4. Obesity, current BMI 29.4  Course: Mary Hernandez is currently in the action stage of change. As such, her goal is to continue with weight loss efforts.   Nutrition goals: She has agreed to keeping a food journal and adhering to recommended goals of 1200-1500 calories and 85 grams of protein.   Exercise goals: For substantial health benefits, adults should do at least 150 minutes (2 hours and 30 minutes) a week of moderate-intensity, or 75 minutes (1 hour and 15 minutes) a week of vigorous-intensity aerobic physical activity, or an equivalent combination of moderate- and vigorous-intensity aerobic activity. Aerobic activity should be performed in episodes of at least 10 minutes, and preferably, it should be spread throughout the week.  Behavioral modification strategies: increasing lean protein intake, decreasing simple carbohydrates, and increasing vegetables.  Mary Hernandez has agreed to follow-up with our clinic in 3 weeks. She was informed of the importance of frequent follow-up visits to maximize her success with intensive lifestyle modifications for her multiple health conditions.   Objective:   Blood pressure (!) 98/57, pulse 70, temperature 98.1 F (36.7 C), temperature source Oral, height 5\' 5"  (1.651 m), weight 176 lb (  79.8 kg), SpO2 95 %. Body mass index is 29.29 kg/m.  General: Cooperative, alert, well developed, in no acute distress. HEENT: Conjunctivae and lids unremarkable. Cardiovascular: Regular rhythm.  Lungs: Normal work of breathing. Neurologic: No focal deficits.   Lab Results  Component  Value Date   CREATININE 0.56 (L) 01/29/2021   BUN 17 01/29/2021   NA 141 01/29/2021   K 3.8 01/29/2021   CL 101 01/29/2021   CO2 21 01/29/2021   Lab Results  Component Value Date   ALT 20 01/29/2021   AST 17 01/29/2021   ALKPHOS 64 01/29/2021   BILITOT 0.3 01/29/2021   Lab Results  Component Value Date   HGBA1C 6.0 (H) 01/29/2021   HGBA1C 5.9 (H) 10/12/2020   HGBA1C 6.2 (H) 06/21/2020   HGBA1C 5.9 (A) 10/07/2019   HGBA1C 6.2 05/17/2019   Lab Results  Component Value Date   INSULIN 27.5 (H) 10/12/2020   INSULIN 21.9 07/03/2020   Lab Results  Component Value Date   TSH 0.557 10/12/2020   Lab Results  Component Value Date   CHOL 173 10/12/2020   HDL 57 10/12/2020   LDLCALC 90 10/12/2020   LDLDIRECT 94.0 05/01/2018   TRIG 149 10/12/2020   CHOLHDL 2.8 06/21/2020   Lab Results  Component Value Date   WBC 6.0 06/21/2020   HGB 13.7 06/21/2020   HCT 41.2 06/21/2020   MCV 91.6 06/21/2020   PLT 223 06/21/2020   Obesity Behavioral Intervention:   Approximately 15 minutes were spent on the discussion below.  ASK: We discussed the diagnosis of obesity with Mary Hernandez today and Mary Hernandez agreed to give [place] permission to discuss obesity behavioral modification therapy today.  ASSESS: Mary Hernandez has the diagnosis of obesity and her BMI today is 29.4. Mary Hernandez is in the action stage of change.   ADVISE: Mary Hernandez was educated on the multiple health risks of obesity as well as the benefit of weight loss to improve her health. She was advised of the need for long term treatment and the importance of lifestyle modifications to improve her current health and to decrease her risk of future health problems.  AGREE: Multiple dietary modification options and treatment options were discussed and Mary Hernandez agreed to follow the recommendations documented in the above note.  ARRANGE: Mary Hernandez was educated on the importance of frequent visits to treat obesity as outlined per CMS and USPSTF guidelines and  agreed to schedule her next follow up appointment today.  Attestation Statements:   Reviewed by clinician on day of visit: allergies, medications, problem list, medical history, surgical history, family history, social history, and previous encounter notes.  I, Water quality scientist, CMA, am acting as transcriptionist for [name], DO  I have reviewed the above documentation for accuracy and completeness, and I agree with the above. [name], DO

## 2021-03-20 NOTE — Telephone Encounter (Signed)
Pt last seen by Dr. Wallace.  

## 2021-03-29 ENCOUNTER — Encounter (INDEPENDENT_AMBULATORY_CARE_PROVIDER_SITE_OTHER): Payer: Self-pay | Admitting: Family Medicine

## 2021-03-29 ENCOUNTER — Other Ambulatory Visit: Payer: Self-pay

## 2021-03-29 ENCOUNTER — Telehealth: Payer: Self-pay

## 2021-03-29 ENCOUNTER — Ambulatory Visit (INDEPENDENT_AMBULATORY_CARE_PROVIDER_SITE_OTHER): Payer: PPO | Admitting: Family Medicine

## 2021-03-29 VITALS — BP 94/58 | HR 65 | Temp 97.8°F | Ht 65.0 in | Wt 175.0 lb

## 2021-03-29 DIAGNOSIS — E669 Obesity, unspecified: Secondary | ICD-10-CM | POA: Diagnosis not present

## 2021-03-29 DIAGNOSIS — F4323 Adjustment disorder with mixed anxiety and depressed mood: Secondary | ICD-10-CM

## 2021-03-29 DIAGNOSIS — Z6831 Body mass index (BMI) 31.0-31.9, adult: Secondary | ICD-10-CM | POA: Diagnosis not present

## 2021-03-29 DIAGNOSIS — G8929 Other chronic pain: Secondary | ICD-10-CM | POA: Diagnosis not present

## 2021-03-29 DIAGNOSIS — M79672 Pain in left foot: Secondary | ICD-10-CM | POA: Diagnosis not present

## 2021-03-29 DIAGNOSIS — E8881 Metabolic syndrome: Secondary | ICD-10-CM | POA: Diagnosis not present

## 2021-03-29 NOTE — Chronic Care Management (AMB) (Signed)
Chronic Care Management Pharmacy Assistant   Name: Mary Hernandez  MRN: [id number] DOB: 1951-01-25  Reason for Encounter: General Adherence Call  Recent office visits:  No visits noted  Recent consult visits:  03/29/21-- [name], DO [facility])- seen for metabolic syndrome, no medication changes, follow up 3 weeks 03/12/21- [name], DO [facility])- seen for metabolic syndrome, increased ozempic from 1 mg to 2 mg weekly, follow up 3 weeks   Hospital visits:  None in previous 6 months  Medications: Outpatient Encounter Medications as of 03/29/2021  Medication Sig   hydrochlorothiazide (HYDRODIURIL) 12.5 MG tablet TAKE ONE TABLET BY MOUTH DAILY   acebutolol (SECTRAL) 200 MG capsule Take 1 capsule (200 mg total) by mouth daily.   Calcium Citrate-Vitamin D 315-250 MG-UNIT TABS Take by mouth.   cetirizine (ZYRTEC) 10 MG tablet Take 10 mg by mouth daily.   diclofenac Sodium (VOLTAREN) 1 % GEL Apply 2 g topically 4 (four) times daily.   EVENING PRIMROSE OIL PO Take 1,500 mg by mouth 2 (two) times daily.   flecainide (TAMBOCOR) 50 MG tablet Take 1.5 tablets (75 mg total) by mouth 2 (two) times daily.   fluticasone (FLONASE) 50 MCG/ACT nasal spray Place 2 sprays into both nostrils daily.   Glucosamine-Chondroit-Vit C-Mn (GLUCOSAMINE 1500 COMPLEX) CAPS Take by mouth.   glycopyrrolate (ROBINUL) 1 MG tablet Take 1 tablet (1 mg total) by mouth 2 (two) times daily.   levothyroxine (SYNTHROID) 112 MCG tablet TAKE ONE TABLET BY MOUTH DAILY   naltrexone (DEPADE) 50 MG tablet Take 50 mg by mouth daily. 1/2 tablet am for 1 week, then 1/2 am and 1/2 pm after 1 week   omega-3 acid ethyl esters (LOVAZA) 1 g capsule Take 1 g by mouth 2 (two) times daily.   ondansetron (ZOFRAN-ODT) 4 MG disintegrating tablet Take 4 mg by mouth every 8 (eight) hours as needed for nausea or vomiting.   Potassium Chloride ER 20 MEQ TBCR Take 2 tablets (40 meq) in the AM and take 1  tablet (20 meq) in the PM as by mouth as directed (Patient taking differently: Take 20 mEq by mouth daily. Take 2 tablets (40 meq) in the AM and take 1 tablet (20 meq) in the PM as by mouth as directed)   pravastatin (PRAVACHOL) 20 MG tablet Take 1 tablet (20 mg total) by mouth at bedtime.   pregabalin (LYRICA) 75 MG capsule Take 75 mg by mouth 2 (two) times daily.   Probiotic Product (PROBIOTIC-10 PO) Take by mouth.   Semaglutide, 2 MG/DOSE, (OZEMPIC, 2 MG/DOSE,) 8 MG/3ML SOPN Inject 2 mg into the skin once a week.   sertraline (ZOLOFT) 50 MG tablet Take 1.5 tablets (75 mg total) by mouth daily.   TART CHERRY PO Take by mouth.   Turmeric 400 MG CAPS Take by mouth.   UNABLE TO FIND Inject 2 mg into the skin once a week. Med Name: Ozempic 2 mg dose (8 mg/3 mL subcutaneous pen injector)   No facility-administered encounter medications on file as of 03/29/2021.   I spoke with Mary Hernandez today and she is doing well overall. She had an appointment with weight management this morning and everything is going good. At her last appointment her ozempic was increased to 2 mg weekly instead of 1 mg. She stated that this increase has been working for her, but she is unable to afford the medication as she has fallen in the donut hole. For a 3 months  supply it costs her $600 and even though her providers are trying to get coupons it has provided no results. She has about a month's worth left and will be holding off on refilling this if she is unable to get assistance. She is hopeful that they will be able to provide her with coupons soon. Other than this Mary Hernandez has no concerns.  Star Rating Drugs: Pravastatin 20 mg- 90 DS last filled 03/19/21  [employer] CPA, CMA

## 2021-04-03 ENCOUNTER — Encounter (INDEPENDENT_AMBULATORY_CARE_PROVIDER_SITE_OTHER): Payer: Self-pay | Admitting: Family Medicine

## 2021-04-03 NOTE — Telephone Encounter (Signed)
Last OV with Dr Wallace 

## 2021-04-04 ENCOUNTER — Encounter: Payer: Self-pay | Admitting: Family Medicine

## 2021-04-04 DIAGNOSIS — M542 Cervicalgia: Secondary | ICD-10-CM | POA: Diagnosis not present

## 2021-04-04 DIAGNOSIS — M25551 Pain in right hip: Secondary | ICD-10-CM | POA: Diagnosis not present

## 2021-04-04 DIAGNOSIS — M9906 Segmental and somatic dysfunction of lower extremity: Secondary | ICD-10-CM | POA: Diagnosis not present

## 2021-04-04 DIAGNOSIS — M9904 Segmental and somatic dysfunction of sacral region: Secondary | ICD-10-CM | POA: Diagnosis not present

## 2021-04-04 DIAGNOSIS — M9902 Segmental and somatic dysfunction of thoracic region: Secondary | ICD-10-CM | POA: Diagnosis not present

## 2021-04-04 DIAGNOSIS — G4486 Cervicogenic headache: Secondary | ICD-10-CM | POA: Diagnosis not present

## 2021-04-04 DIAGNOSIS — M9905 Segmental and somatic dysfunction of pelvic region: Secondary | ICD-10-CM | POA: Diagnosis not present

## 2021-04-04 DIAGNOSIS — M79672 Pain in left foot: Secondary | ICD-10-CM | POA: Diagnosis not present

## 2021-04-04 DIAGNOSIS — M9901 Segmental and somatic dysfunction of cervical region: Secondary | ICD-10-CM | POA: Diagnosis not present

## 2021-04-04 DIAGNOSIS — M545 Low back pain, unspecified: Secondary | ICD-10-CM | POA: Diagnosis not present

## 2021-04-04 DIAGNOSIS — M9903 Segmental and somatic dysfunction of lumbar region: Secondary | ICD-10-CM | POA: Diagnosis not present

## 2021-04-04 NOTE — Progress Notes (Signed)
Chief Complaint:   OBESITY Mary Hernandez is here to discuss her progress with her obesity treatment plan along with follow-up of her obesity related diagnoses.   Today's visit was #: 12 Starting weight: 192 lbs Starting date: 07/03/2020 Today's weight: 175 lbs Today's date: 03/29/2021 Weight change since last visit: 1 lb Total lbs lost to date: 17 lbs Body mass index is 29.12 kg/m.  Total weight loss percentage to date: -8.85%  Interim History: Mary Hernandez says that Ozempic 2 mg cost $600 for 90 days.  She endorses left foot pain.  She says she has been lax on stretching her Achilles.  She is continuing pool/pilates.    Cannot decrease HCTZ more due to labyrinthitis.  Current Meal Plan: the Category 2 Plan for 75-80% of the time.  Current Exercise Plan: Water aerobics/pilates/walking for 30-60 minutes 2-5 times per week.  Assessment/Plan:   1. Metabolic syndrome Starting goal: Lose 7-10% of starting weight. She will continue to focus on protein-rich, low simple carbohydrate foods. We reviewed the importance of hydration, regular exercise for stress reduction, and restorative sleep.  We will continue to check lab work every 3 months, with 10% weight loss, or should any other concerns arise.  2. Chronic foot pain, left We will continue to monitor as it relates to her weight loss journey.  3. Situational mixed anxiety and depressive disorder Mary Hernandez is taking Zoloft 75 mg daily.   Plan:  The current medical regimen is effective;  continue present plan and medications. Behavior modification techniques were discussed today to help Mary Hernandez deal with her anxiety.    4. Obesity, current BMI 29.2  Course: Mary Hernandez is currently in the action stage of change. As such, her goal is to continue with weight loss efforts.   Nutrition goals: She has agreed to the Category 2 Plan.   Exercise goals:  As is.  Behavioral modification strategies: increasing lean protein intake, decreasing simple  carbohydrates, increasing vegetables, and increasing water intake.  Mary Hernandez has agreed to follow-up with our clinic in 3 weeks. She was informed of the importance of frequent follow-up visits to maximize her success with intensive lifestyle modifications for her multiple health conditions.   Objective:   Blood pressure (!) 94/58, pulse 65, temperature 97.8 F (36.6 C), temperature source Oral, height 5\' 5"  (1.651 m), weight 175 lb (79.4 kg), SpO2 97 %. Body mass index is 29.12 kg/m.  General: Cooperative, alert, well developed, in no acute distress. HEENT: Conjunctivae and lids unremarkable. Cardiovascular: Regular rhythm.  Lungs: Normal work of breathing. Neurologic: No focal deficits.   Lab Results  Component Value Date   CREATININE 0.56 (L) 01/29/2021   BUN 17 01/29/2021   NA 141 01/29/2021   K 3.8 01/29/2021   CL 101 01/29/2021   CO2 21 01/29/2021   Lab Results  Component Value Date   ALT 20 01/29/2021   AST 17 01/29/2021   ALKPHOS 64 01/29/2021   BILITOT 0.3 01/29/2021   Lab Results  Component Value Date   HGBA1C 6.0 (H) 01/29/2021   HGBA1C 5.9 (H) 10/12/2020   HGBA1C 6.2 (H) 06/21/2020   HGBA1C 5.9 (A) 10/07/2019   HGBA1C 6.2 05/17/2019   Lab Results  Component Value Date   INSULIN 27.5 (H) 10/12/2020   INSULIN 21.9 07/03/2020   Lab Results  Component Value Date   TSH 0.557 10/12/2020   Lab Results  Component Value Date   CHOL 173 10/12/2020   HDL 57 10/12/2020   LDLCALC 90 10/12/2020  LDLDIRECT 94.0 05/01/2018   TRIG 149 10/12/2020   CHOLHDL 2.8 06/21/2020   Lab Results  Component Value Date   VD25OH 52.3 07/03/2020   Lab Results  Component Value Date   WBC 6.0 06/21/2020   HGB 13.7 06/21/2020   HCT 41.2 06/21/2020   MCV 91.6 06/21/2020   PLT 223 06/21/2020   Obesity Behavioral Intervention:   Approximately 15 minutes were spent on the discussion below.  ASK: We discussed the diagnosis of obesity with Edd Fabian today and Mary Hernandez agreed to  give [place] permission to discuss obesity behavioral modification therapy today.  ASSESS: Aizza has the diagnosis of obesity and her BMI today is 29.2. Deysha is in the action stage of change.   ADVISE: Mary Hernandez was educated on the multiple health risks of obesity as well as the benefit of weight loss to improve her health. She was advised of the need for long term treatment and the importance of lifestyle modifications to improve her current health and to decrease her risk of future health problems.  AGREE: Multiple dietary modification options and treatment options were discussed and Mary Hernandez agreed to follow the recommendations documented in the above note.  ARRANGE: Mary Hernandez was educated on the importance of frequent visits to treat obesity as outlined per CMS and USPSTF guidelines and agreed to schedule her next follow up appointment today.  Attestation Statements:   Reviewed by clinician on day of visit: allergies, medications, problem list, medical history, surgical history, family history, social history, and previous encounter notes.  I, Water quality scientist, CMA, am acting as transcriptionist for [name], DO  I have reviewed the above documentation for accuracy and completeness, and I agree with the above. [name], DO

## 2021-04-05 ENCOUNTER — Telehealth (INDEPENDENT_AMBULATORY_CARE_PROVIDER_SITE_OTHER): Payer: Self-pay | Admitting: Family Medicine

## 2021-04-05 NOTE — Telephone Encounter (Signed)
Patient calling about the status of Ozembic coupon. Please advise.

## 2021-04-05 NOTE — Telephone Encounter (Signed)
Replied to pts mychart msg 

## 2021-04-10 NOTE — Telephone Encounter (Signed)
Pt last seen by Dr. Wallace.  

## 2021-04-12 ENCOUNTER — Other Ambulatory Visit: Payer: Self-pay

## 2021-04-12 ENCOUNTER — Encounter (INDEPENDENT_AMBULATORY_CARE_PROVIDER_SITE_OTHER): Payer: Self-pay | Admitting: Family Medicine

## 2021-04-12 ENCOUNTER — Ambulatory Visit (INDEPENDENT_AMBULATORY_CARE_PROVIDER_SITE_OTHER): Payer: PPO | Admitting: Family Medicine

## 2021-04-12 VITALS — BP 95/59 | HR 65 | Temp 98.0°F | Ht 65.0 in | Wt 174.0 lb

## 2021-04-12 DIAGNOSIS — M79672 Pain in left foot: Secondary | ICD-10-CM | POA: Diagnosis not present

## 2021-04-12 DIAGNOSIS — E669 Obesity, unspecified: Secondary | ICD-10-CM | POA: Diagnosis not present

## 2021-04-12 DIAGNOSIS — Z6831 Body mass index (BMI) 31.0-31.9, adult: Secondary | ICD-10-CM

## 2021-04-12 DIAGNOSIS — G8929 Other chronic pain: Secondary | ICD-10-CM

## 2021-04-12 DIAGNOSIS — F4323 Adjustment disorder with mixed anxiety and depressed mood: Secondary | ICD-10-CM | POA: Diagnosis not present

## 2021-04-12 DIAGNOSIS — E8881 Metabolic syndrome: Secondary | ICD-10-CM | POA: Diagnosis not present

## 2021-04-13 DIAGNOSIS — M542 Cervicalgia: Secondary | ICD-10-CM | POA: Diagnosis not present

## 2021-04-13 DIAGNOSIS — M79672 Pain in left foot: Secondary | ICD-10-CM | POA: Diagnosis not present

## 2021-04-13 DIAGNOSIS — M9903 Segmental and somatic dysfunction of lumbar region: Secondary | ICD-10-CM | POA: Diagnosis not present

## 2021-04-13 DIAGNOSIS — G4486 Cervicogenic headache: Secondary | ICD-10-CM | POA: Diagnosis not present

## 2021-04-13 DIAGNOSIS — M9902 Segmental and somatic dysfunction of thoracic region: Secondary | ICD-10-CM | POA: Diagnosis not present

## 2021-04-13 DIAGNOSIS — M25551 Pain in right hip: Secondary | ICD-10-CM | POA: Diagnosis not present

## 2021-04-13 DIAGNOSIS — M9904 Segmental and somatic dysfunction of sacral region: Secondary | ICD-10-CM | POA: Diagnosis not present

## 2021-04-13 DIAGNOSIS — M9901 Segmental and somatic dysfunction of cervical region: Secondary | ICD-10-CM | POA: Diagnosis not present

## 2021-04-13 DIAGNOSIS — M9906 Segmental and somatic dysfunction of lower extremity: Secondary | ICD-10-CM | POA: Diagnosis not present

## 2021-04-13 DIAGNOSIS — M545 Low back pain, unspecified: Secondary | ICD-10-CM | POA: Diagnosis not present

## 2021-04-13 DIAGNOSIS — M9905 Segmental and somatic dysfunction of pelvic region: Secondary | ICD-10-CM | POA: Diagnosis not present

## 2021-04-16 ENCOUNTER — Other Ambulatory Visit (INDEPENDENT_AMBULATORY_CARE_PROVIDER_SITE_OTHER): Payer: Self-pay | Admitting: Family Medicine

## 2021-04-16 NOTE — Telephone Encounter (Signed)
Pt last seen by Dr. Wallace.  

## 2021-04-25 NOTE — Progress Notes (Signed)
Chief Complaint:   OBESITY Mary Hernandez is here to discuss her progress with her obesity treatment plan along with follow-up of her obesity related diagnoses.   Today's visit was #: 14 Starting weight: 192 lbs Starting date: 07/03/2020 Today's weight: 174 lbs Today's date: 04/12/2021 Weight change since last visit: 1 lb Total lbs lost to date: 18 lbs Body mass index is 28.96 kg/m.  Total weight loss percentage to date: -9.38%  Interim History:  Mary Hernandez says she is doing more pool exercise to avoid foot pain.  She is taking Ozempic 2 mg subcutaneously weekly. Current Meal Plan: the Category 2 Plan for 85% of the time.  Current Exercise Plan: Swim walking for 60 minutes 3 times per week. Current Anti-Obesity Medications: Ozempic 2 mg subcutaneously weekly. Side effects: None.  Assessment/Plan:   1. Metabolic syndrome Starting goal: Lose 7-10% of starting weight. She will continue to focus on protein-rich, low simple carbohydrate foods. We reviewed the importance of hydration, regular exercise for stress reduction, and restorative sleep.  We will continue to check lab work every 3 months, with 10% weight loss, or should any other concerns arise.  2. Chronic foot pain, left Mary Hernandez has been doing more exercise in the pool due to foot pain.  We will continue to monitor as it relates to her weight loss journey.  3. Situational mixed anxiety and depressive disorder Mary Hernandez is taking Zoloft 75 mg daily.   Plan:  The current medical regimen is effective;  continue present plan and medications. Behavior modification techniques were discussed today to help Mary Hernandez deal with her anxiety.    4. Obesity, current BMI 29.1  Course: Mary Hernandez is currently in the action stage of change. As such, her goal is to continue with weight loss efforts.   Nutrition goals: She has agreed to the Category 2 Plan.   Exercise goals:  As is.  Behavioral modification strategies: increasing lean protein intake, decreasing  simple carbohydrates, increasing vegetables, and increasing water intake.  Mary Hernandez has agreed to follow-up with our clinic in 4 weeks. She was informed of the importance of frequent follow-up visits to maximize her success with intensive lifestyle modifications for her multiple health conditions.   Objective:   Blood pressure (!) 95/59, pulse 65, temperature 98 F (36.7 C), temperature source Oral, height 5\' 5"  (1.651 m), weight 174 lb (78.9 kg), SpO2 95 %. Body mass index is 28.96 kg/m.  General: Cooperative, alert, well developed, in no acute distress. HEENT: Conjunctivae and lids unremarkable. Cardiovascular: Regular rhythm.  Lungs: Normal work of breathing. Neurologic: No focal deficits.   Lab Results  Component Value Date   CREATININE 0.56 (L) 01/29/2021   BUN 17 01/29/2021   NA 141 01/29/2021   K 3.8 01/29/2021   CL 101 01/29/2021   CO2 21 01/29/2021   Lab Results  Component Value Date   ALT 20 01/29/2021   AST 17 01/29/2021   ALKPHOS 64 01/29/2021   BILITOT 0.3 01/29/2021   Lab Results  Component Value Date   HGBA1C 6.0 (H) 01/29/2021   HGBA1C 5.9 (H) 10/12/2020   HGBA1C 6.2 (H) 06/21/2020   HGBA1C 5.9 (A) 10/07/2019   HGBA1C 6.2 05/17/2019   Lab Results  Component Value Date   INSULIN 27.5 (H) 10/12/2020   INSULIN 21.9 07/03/2020   Lab Results  Component Value Date   TSH 0.557 10/12/2020   Lab Results  Component Value Date   CHOL 173 10/12/2020   HDL 57 10/12/2020   [place]  90 10/12/2020   LDLDIRECT 94.0 05/01/2018   TRIG 149 10/12/2020   CHOLHDL 2.8 06/21/2020   Lab Results  Component Value Date   VD25OH 52.3 07/03/2020   Lab Results  Component Value Date   WBC 6.0 06/21/2020   HGB 13.7 06/21/2020   HCT 41.2 06/21/2020   MCV 91.6 06/21/2020   PLT 223 06/21/2020   Obesity Behavioral Intervention:   Approximately 15 minutes were spent on the discussion below.  ASK: We discussed the diagnosis of obesity with Mary Hernandez today and Mary Hernandez agreed  to give [place] permission to discuss obesity behavioral modification therapy today.  ASSESS: Mary Hernandez has the diagnosis of obesity and her BMI today is 29.1. Mary Hernandez is in the action stage of change.   ADVISE: Mary Hernandez was educated on the multiple health risks of obesity as well as the benefit of weight loss to improve her health. She was advised of the need for long term treatment and the importance of lifestyle modifications to improve her current health and to decrease her risk of future health problems.  AGREE: Multiple dietary modification options and treatment options were discussed and Mary Hernandez agreed to follow the recommendations documented in the above note.  ARRANGE: Mary Hernandez was educated on the importance of frequent visits to treat obesity as outlined per CMS and USPSTF guidelines and agreed to schedule her next follow up appointment today.  Attestation Statements:   Reviewed by clinician on day of visit: allergies, medications, problem list, medical history, surgical history, family history, social history, and previous encounter notes.  I, Water quality scientist, CMA, am acting as transcriptionist for [name], DO  I have reviewed the above documentation for accuracy and completeness, and I agree with the above. [name], DO

## 2021-05-01 ENCOUNTER — Telehealth: Payer: Self-pay | Admitting: Pharmacist

## 2021-05-01 NOTE — Chronic Care Management (AMB) (Signed)
Chronic Care Management Pharmacy Assistant   Name: Ahlayla Buggs  MRN: [id number] DOB: Feb 17, 1951   Reason for Encounter: General Adherence Disease State Call   Recent office visits:  None  Recent consult visits:  None  Hospital visits:  None in previous 6 months  Medications: Outpatient Encounter Medications as of 05/01/2021  Medication Sig   acebutolol (SECTRAL) 200 MG capsule Take 1 capsule (200 mg total) by mouth daily.   Calcium Citrate-Vitamin D 315-250 MG-UNIT TABS Take by mouth.   cetirizine (ZYRTEC) 10 MG tablet Take 10 mg by mouth daily.   diclofenac Sodium (VOLTAREN) 1 % GEL Apply 2 g topically 4 (four) times daily.   EVENING PRIMROSE OIL PO Take 1,500 mg by mouth 2 (two) times daily.   flecainide (TAMBOCOR) 50 MG tablet Take 1.5 tablets (75 mg total) by mouth 2 (two) times daily.   fluticasone (FLONASE) 50 MCG/ACT nasal spray Place 2 sprays into both nostrils daily.   Glucosamine-Chondroit-Vit C-Mn (GLUCOSAMINE 1500 COMPLEX) CAPS Take by mouth.   glycopyrrolate (ROBINUL) 1 MG tablet Take 1 tablet (1 mg total) by mouth 2 (two) times daily.   hydrochlorothiazide (HYDRODIURIL) 12.5 MG tablet TAKE ONE TABLET BY MOUTH DAILY   levothyroxine (SYNTHROID) 112 MCG tablet TAKE ONE TABLET BY MOUTH DAILY   naltrexone (DEPADE) 50 MG tablet Take 50 mg by mouth daily. 1/2 tablet am for 1 week, then 1/2 am and 1/2 pm after 1 week   omega-3 acid ethyl esters (LOVAZA) 1 g capsule Take 1 g by mouth 2 (two) times daily.   ondansetron (ZOFRAN-ODT) 4 MG disintegrating tablet Take 4 mg by mouth every 8 (eight) hours as needed for nausea or vomiting.   Potassium Chloride ER 20 MEQ TBCR Take 2 tablets (40 meq) in the AM and take 1 tablet (20 meq) in the PM as by mouth as directed (Patient taking differently: Take 20 mEq by mouth daily. Take 2 tablets (40 meq) in the AM and take 1 tablet (20 meq) in the PM as by mouth as directed)   pravastatin (PRAVACHOL) 20 MG tablet Take 1 tablet (20  mg total) by mouth at bedtime.   pregabalin (LYRICA) 75 MG capsule Take 75 mg by mouth 2 (two) times daily.   Probiotic Product (PROBIOTIC-10 PO) Take by mouth.   Semaglutide, 2 MG/DOSE, (OZEMPIC, 2 MG/DOSE,) 8 MG/3ML SOPN Inject 2 mg into the skin once a week.   sertraline (ZOLOFT) 50 MG tablet Take 1.5 tablets (75 mg total) by mouth daily.   TART CHERRY PO Take by mouth.   Turmeric 400 MG CAPS Take by mouth.   UNABLE TO FIND Inject 2 mg into the skin once a week. Med Name: Ozempic 2 mg dose (8 mg/3 mL subcutaneous pen injector)   No facility-administered encounter medications on file as of 05/01/2021.    Reviewed chart for medication changes ahead of medication coordination call.  No OVs, Consults, or hospital visits since last care coordination call/Pharmacist visit. (If appropriate, list visit date, provider name)  No medication changes indicated OR if recent visit, treatment plan here.  Have you had any problems recently with your health?  Have you had any problems with your pharmacy?  What issues or side effects are you having with your medications?  What would you like me to pass along to Madelin Rear, CPP for him to help you with?   What can we do to take care of you better?    Star Rating Drugs: Pravastatin  20 mg last filled 03/19/2021 90 DS Ozempic '14mg'$ /mL/5.'5mg'$ /mL/'8mg'$ /60m last filled 02/15/2021 84 DS  **Unsuccessful attempt made at contacting patient to complete this call, chart review completed.  April D Calhoun, CLupusPharmacist Assistant [phone]

## 2021-05-02 ENCOUNTER — Encounter (INDEPENDENT_AMBULATORY_CARE_PROVIDER_SITE_OTHER): Payer: Self-pay | Admitting: Family Medicine

## 2021-05-02 ENCOUNTER — Other Ambulatory Visit: Payer: Self-pay

## 2021-05-02 ENCOUNTER — Ambulatory Visit (INDEPENDENT_AMBULATORY_CARE_PROVIDER_SITE_OTHER): Payer: PPO | Admitting: Family Medicine

## 2021-05-02 VITALS — BP 99/58 | HR 71 | Temp 98.5°F | Ht 65.0 in | Wt 171.0 lb

## 2021-05-02 DIAGNOSIS — E669 Obesity, unspecified: Secondary | ICD-10-CM

## 2021-05-02 DIAGNOSIS — E8881 Metabolic syndrome: Secondary | ICD-10-CM

## 2021-05-02 DIAGNOSIS — Z6831 Body mass index (BMI) 31.0-31.9, adult: Secondary | ICD-10-CM

## 2021-05-02 DIAGNOSIS — G8929 Other chronic pain: Secondary | ICD-10-CM | POA: Diagnosis not present

## 2021-05-02 DIAGNOSIS — I152 Hypertension secondary to endocrine disorders: Secondary | ICD-10-CM

## 2021-05-02 DIAGNOSIS — F411 Generalized anxiety disorder: Secondary | ICD-10-CM | POA: Diagnosis not present

## 2021-05-02 DIAGNOSIS — E1159 Type 2 diabetes mellitus with other circulatory complications: Secondary | ICD-10-CM

## 2021-05-03 DIAGNOSIS — M25551 Pain in right hip: Secondary | ICD-10-CM | POA: Diagnosis not present

## 2021-05-03 DIAGNOSIS — M9905 Segmental and somatic dysfunction of pelvic region: Secondary | ICD-10-CM | POA: Diagnosis not present

## 2021-05-03 DIAGNOSIS — M79672 Pain in left foot: Secondary | ICD-10-CM | POA: Diagnosis not present

## 2021-05-03 DIAGNOSIS — M9904 Segmental and somatic dysfunction of sacral region: Secondary | ICD-10-CM | POA: Diagnosis not present

## 2021-05-03 DIAGNOSIS — M9906 Segmental and somatic dysfunction of lower extremity: Secondary | ICD-10-CM | POA: Diagnosis not present

## 2021-05-03 MED ORDER — HYDROCHLOROTHIAZIDE 25 MG PO TABS: 25.0000 mg | ORAL_TABLET | Freq: Every day | ORAL | 0 refills | Status: DC

## 2021-05-03 NOTE — Progress Notes (Signed)
Chief Complaint:   OBESITY Mary Hernandez is here to discuss her progress with her obesity treatment plan along with follow-up of her obesity related diagnoses. See Medical Weight Management Flowsheet for complete bioelectrical impedance results.  Today's visit was #: 14 Starting weight: 192 lbs Starting date: 07/03/2020 Today's weight: 171 lbs Today's date: 05/02/2021 Weight change since last visit: 3 lbs Total lbs lost to date: 21 lbs Body mass index is 28.46 kg/m.  Total weight loss percentage to date: -10.94%  Interim History:  Mary Hernandez says she is enjoying having her daughter and grandchildren visiting.  She is noticing less right hip pain with less PT.  Will talk to Dr. Paulla Fore about it tomorrow.  Nutrition Plan: the Category 2 Plan for 60% of the time. Activity:  Increased walking/Increased activity. Anti-obesity medications: Ozempic 2 mg subcutaneously weekly. Reported side effects: None.  Assessment/Plan:   1. Metabolic syndrome Starting goal met: Lose 7-10% of starting weight. She will continue to focus on protein-rich, low simple carbohydrate foods. We reviewed the importance of hydration, regular exercise for stress reduction, and restorative sleep.  We will continue to check lab work every 3 months, with 10% weight loss, or should any other concerns arise.  2. Other chronic pain We will continue to monitor as it relates to her weight loss journey.  3. Hypertension associated with diabetes ([place]) Not at goal. Medications: HCTZ 25 mg daily.   Plan: Avoid buying foods that are: processed, frozen, or prepackaged to avoid excess salt. We will watch for signs of hypotension as she continues lifestyle modifications.  BP Readings from Last 3 Encounters:  05/02/21 (!) 99/58  04/12/21 (!) 95/59  03/29/21 (!) 94/58   Lab Results  Component Value Date   CREATININE 0.56 (L) 01/29/2021   - Refill hydrochlorothiazide (HYDRODIURIL) 25 MG tablet; Take 1 tablet (25 mg total) by mouth  daily.  Dispense: 90 tablet; Refill: 0  4. Mary Hernandez is taking Zoloft 75 mg daily.   Plan:  The current medical regimen is effective;  continue present plan and medications. Behavior modification techniques were discussed today to help Mary Hernandez deal with her anxiety.  5. Obesity, current BMI 28.6  Course: Mary Hernandez is currently in the action stage of change. As such, her goal is to continue with weight loss efforts.   Nutrition goals: She has agreed to the Category 2 Plan.   Exercise goals:  As is.  Behavioral modification strategies: increasing lean protein intake, decreasing simple carbohydrates, increasing vegetables, and increasing water intake.  Mary Hernandez has agreed to follow-up with our clinic in 4 weeks. She was informed of the importance of frequent follow-up visits to maximize her success with intensive lifestyle modifications for her multiple health conditions.   Objective:   Blood pressure (!) 99/58, pulse 71, temperature 98.5 F (36.9 C), temperature source Oral, height '5\' 5"'  (1.651 m), weight 171 lb (77.6 kg), SpO2 96 %. Body mass index is 28.46 kg/m.  General: Cooperative, alert, well developed, in no acute distress. HEENT: Conjunctivae and lids unremarkable. Cardiovascular: Regular rhythm.  Lungs: Normal work of breathing. Neurologic: No focal deficits.   Lab Results  Component Value Date   CREATININE 0.56 (L) 01/29/2021   BUN 17 01/29/2021   NA 141 01/29/2021   K 3.8 01/29/2021   CL 101 01/29/2021   CO2 21 01/29/2021   Lab Results  Component Value Date   ALT 20 01/29/2021   AST 17 01/29/2021   ALKPHOS 64 01/29/2021  BILITOT 0.3 01/29/2021   Lab Results  Component Value Date   HGBA1C 6.0 (H) 01/29/2021   HGBA1C 5.9 (H) 10/12/2020   HGBA1C 6.2 (H) 06/21/2020   HGBA1C 5.9 (A) 10/07/2019   HGBA1C 6.2 05/17/2019   Lab Results  Component Value Date   INSULIN 27.5 (H) 10/12/2020   INSULIN 21.9 07/03/2020   Lab Results   Component Value Date   TSH 0.557 10/12/2020   Lab Results  Component Value Date   CHOL 173 10/12/2020   HDL 57 10/12/2020   LDLCALC 90 10/12/2020   LDLDIRECT 94.0 05/01/2018   TRIG 149 10/12/2020   CHOLHDL 2.8 06/21/2020   Lab Results  Component Value Date   VD25OH 52.3 07/03/2020   Lab Results  Component Value Date   WBC 6.0 06/21/2020   HGB 13.7 06/21/2020   HCT 41.2 06/21/2020   MCV 91.6 06/21/2020   PLT 223 06/21/2020   Obesity Behavioral Intervention:   Approximately 15 minutes were spent on the discussion below.  ASK: We discussed the diagnosis of obesity with Mary Hernandez today and Mary Hernandez agreed to give [place] permission to discuss obesity behavioral modification therapy today.  ASSESS: Mary Hernandez has the diagnosis of obesity and her BMI today is 28.6. Mary Hernandez is in the action stage of change.   ADVISE: Mary Hernandez was educated on the multiple health risks of obesity as well as the benefit of weight loss to improve her health. She was advised of the need for long term treatment and the importance of lifestyle modifications to improve her current health and to decrease her risk of future health problems.  AGREE: Multiple dietary modification options and treatment options were discussed and Mary Hernandez agreed to follow the recommendations documented in the above note.  ARRANGE: Mary Hernandez was educated on the importance of frequent visits to treat obesity as outlined per CMS and USPSTF guidelines and agreed to schedule her next follow up appointment today.  Attestation Statements:   Reviewed by clinician on day of visit: allergies, medications, problem list, medical history, surgical history, family history, social history, and previous encounter notes.  I, Water quality scientist, CMA, am acting as transcriptionist for [name], DO  I have reviewed the above documentation for accuracy and completeness, and I agree with the above. [name], DO

## 2021-05-04 ENCOUNTER — Other Ambulatory Visit: Payer: Self-pay | Admitting: Family Medicine

## 2021-05-04 DIAGNOSIS — Z1231 Encounter for screening mammogram for malignant neoplasm of breast: Secondary | ICD-10-CM

## 2021-05-05 ENCOUNTER — Other Ambulatory Visit: Payer: Self-pay | Admitting: Internal Medicine

## 2021-05-07 ENCOUNTER — Encounter (INDEPENDENT_AMBULATORY_CARE_PROVIDER_SITE_OTHER): Payer: Self-pay

## 2021-05-07 MED ORDER — FLECAINIDE ACETATE 50 MG PO TABS: 75.0000 mg | ORAL_TABLET | Freq: Two times a day (BID) | ORAL | 1 refills | Status: DC

## 2021-05-11 DIAGNOSIS — M25551 Pain in right hip: Secondary | ICD-10-CM | POA: Diagnosis not present

## 2021-05-11 DIAGNOSIS — M9904 Segmental and somatic dysfunction of sacral region: Secondary | ICD-10-CM | POA: Diagnosis not present

## 2021-05-11 DIAGNOSIS — M9901 Segmental and somatic dysfunction of cervical region: Secondary | ICD-10-CM | POA: Diagnosis not present

## 2021-05-11 DIAGNOSIS — G4486 Cervicogenic headache: Secondary | ICD-10-CM | POA: Diagnosis not present

## 2021-05-11 DIAGNOSIS — M9905 Segmental and somatic dysfunction of pelvic region: Secondary | ICD-10-CM | POA: Diagnosis not present

## 2021-05-11 DIAGNOSIS — M545 Low back pain, unspecified: Secondary | ICD-10-CM | POA: Diagnosis not present

## 2021-05-11 DIAGNOSIS — M9906 Segmental and somatic dysfunction of lower extremity: Secondary | ICD-10-CM | POA: Diagnosis not present

## 2021-05-11 DIAGNOSIS — M542 Cervicalgia: Secondary | ICD-10-CM | POA: Diagnosis not present

## 2021-05-11 DIAGNOSIS — M9902 Segmental and somatic dysfunction of thoracic region: Secondary | ICD-10-CM | POA: Diagnosis not present

## 2021-05-11 DIAGNOSIS — M9903 Segmental and somatic dysfunction of lumbar region: Secondary | ICD-10-CM | POA: Diagnosis not present

## 2021-05-16 ENCOUNTER — Telehealth: Payer: PPO

## 2021-05-18 ENCOUNTER — Other Ambulatory Visit (INDEPENDENT_AMBULATORY_CARE_PROVIDER_SITE_OTHER): Payer: Self-pay | Admitting: Family Medicine

## 2021-05-21 ENCOUNTER — Telehealth: Payer: Self-pay | Admitting: Pharmacist

## 2021-05-21 NOTE — Chronic Care Management (AMB) (Addendum)
Chronic Care Management Pharmacy Assistant   Name: Mary Hernandez  MRN: [id number] DOB: 25-Feb-1951  Reason for Encounter: Medication Coordination Call    Recent office visits:  05/02/2021 OV (weight management) [name], DO; no medication changes indicated.  Recent consult visits:  None  Hospital visits:  None in previous 6 months  Medications: Outpatient Encounter Medications as of 05/21/2021  Medication Sig   acebutolol (SECTRAL) 200 MG capsule Take 1 capsule (200 mg total) by mouth daily.   Calcium Citrate-Vitamin D 315-250 MG-UNIT TABS Take by mouth.   cetirizine (ZYRTEC) 10 MG tablet Take 10 mg by mouth daily.   diclofenac Sodium (VOLTAREN) 1 % GEL Apply 2 g topically 4 (four) times daily.   EVENING PRIMROSE OIL PO Take 1,500 mg by mouth 2 (two) times daily.   flecainide (TAMBOCOR) 50 MG tablet Take 1.5 tablets (75 mg total) by mouth 2 (two) times daily.   fluticasone (FLONASE) 50 MCG/ACT nasal spray Place 2 sprays into both nostrils daily.   Glucosamine-Chondroit-Vit C-Mn (GLUCOSAMINE 1500 COMPLEX) CAPS Take by mouth.   glycopyrrolate (ROBINUL) 1 MG tablet Take 1 tablet (1 mg total) by mouth 2 (two) times daily.   hydrochlorothiazide (HYDRODIURIL) 25 MG tablet Take 1 tablet (25 mg total) by mouth daily.   levothyroxine (SYNTHROID) 112 MCG tablet TAKE ONE TABLET BY MOUTH DAILY   naltrexone (DEPADE) 50 MG tablet Take 50 mg by mouth daily. 1/2 tablet am for 1 week, then 1/2 am and 1/2 pm after 1 week   omega-3 acid ethyl esters (LOVAZA) 1 g capsule Take 1 g by mouth 2 (two) times daily.   ondansetron (ZOFRAN-ODT) 4 MG disintegrating tablet Take 4 mg by mouth every 8 (eight) hours as needed for nausea or vomiting.   Potassium Chloride ER 20 MEQ TBCR Take 2 tablets (40 meq) in the AM and take 1 tablet (20 meq) in the PM as by mouth as directed (Patient taking differently: Take 20 mEq by mouth daily. Take 2 tablets (40 meq) in the AM and take 1 tablet (20 meq) in the PM  as by mouth as directed)   pravastatin (PRAVACHOL) 20 MG tablet Take 1 tablet (20 mg total) by mouth at bedtime.   pregabalin (LYRICA) 75 MG capsule Take 75 mg by mouth 2 (two) times daily.   Probiotic Product (PROBIOTIC-10 PO) Take by mouth.   Semaglutide, 2 MG/DOSE, (OZEMPIC, 2 MG/DOSE,) 8 MG/3ML SOPN Inject 2 mg into the skin once a week.   sertraline (ZOLOFT) 50 MG tablet Take 1.5 tablets (75 mg total) by mouth daily.   TART CHERRY PO Take by mouth.   Turmeric 400 MG CAPS Take by mouth.   UNABLE TO FIND Inject 2 mg into the skin once a week. Med Name: Ozempic 2 mg dose (8 mg/3 mL subcutaneous pen injector)   No facility-administered encounter medications on file as of 05/21/2021.   Reviewed chart for medication changes ahead of medication coordination call.  No OVs, Consults, or hospital visits since last care coordination call/Pharmacist visit. (If appropriate, list visit date, provider name)  No medication changes indicated OR if recent visit, treatment plan here.  BP Readings from Last 3 Encounters:  05/02/21 (!) 99/58  04/12/21 (!) 95/59  03/29/21 (!) 94/58    Lab Results  Component Value Date   HGBA1C 6.0 (H) 01/29/2021     Patient obtains medications through Adherence Packaging  90 Days   Last adherence delivery included:  Fish Oil 1200 mg Take  two capsules every morning and take one capsule every evening Evening Primrose 1000 Take one capsule every morning Calcium Citrate 315 Take one tab every morning and take one tab every evening Glucosamine Sulf Dipotassium Cl 750 mg- Chondroitin Sulf 600 Mg Tab - Take one tab every morning and take one tab every evening Claritin 10 mg Tablet Take one tab every evening Tart Cherry 1200 mg Tab Take one tab every morning Acidophilus Probiotic Blend 175 mg Capsule Take one capsule every morning  Patient is due for next adherence delivery on: 05/31/2021. Called patient and reviewed medications and coordinated delivery.  This  delivery to include: Fish Oil 1200 mg Take two capsules every morning and take one capsule every evening Evening Primrose 1000 Take one capsule every morning Calcium Citrate 315 Take one tab every morning and take one tab every evening Glucosamine Sulf Dipotassium Cl 750 mg- Chondroitin Sulf 600 Mg Tab - Take one tab every morning and take one tab every evening Claritin 10 mg Tablet Take one tab every evening Tart Cherry 1200 mg Tab Take one tab every morning Acidophilus Probiotic Blend 175 mg Capsule Take one capsule every morning   Confirmed delivery date of 05/31/2021, advised patient that pharmacy will contact them the morning of delivery.   April D Calhoun, [place] Pharmacist Assistant [phone]  10 minutes spent in review, coordination, and documentation.  Reviewed by: Beverly Milch, PharmD Clinical Pharmacist [phone]

## 2021-05-28 DIAGNOSIS — M542 Cervicalgia: Secondary | ICD-10-CM | POA: Diagnosis not present

## 2021-05-28 DIAGNOSIS — M79672 Pain in left foot: Secondary | ICD-10-CM | POA: Diagnosis not present

## 2021-05-28 DIAGNOSIS — M9901 Segmental and somatic dysfunction of cervical region: Secondary | ICD-10-CM | POA: Diagnosis not present

## 2021-05-28 DIAGNOSIS — G4486 Cervicogenic headache: Secondary | ICD-10-CM | POA: Diagnosis not present

## 2021-05-28 DIAGNOSIS — M545 Low back pain, unspecified: Secondary | ICD-10-CM | POA: Diagnosis not present

## 2021-05-28 DIAGNOSIS — M9905 Segmental and somatic dysfunction of pelvic region: Secondary | ICD-10-CM | POA: Diagnosis not present

## 2021-05-28 DIAGNOSIS — M9902 Segmental and somatic dysfunction of thoracic region: Secondary | ICD-10-CM | POA: Diagnosis not present

## 2021-05-28 DIAGNOSIS — M25551 Pain in right hip: Secondary | ICD-10-CM | POA: Diagnosis not present

## 2021-05-28 DIAGNOSIS — M9903 Segmental and somatic dysfunction of lumbar region: Secondary | ICD-10-CM | POA: Diagnosis not present

## 2021-05-28 DIAGNOSIS — M9904 Segmental and somatic dysfunction of sacral region: Secondary | ICD-10-CM | POA: Diagnosis not present

## 2021-05-28 DIAGNOSIS — M9906 Segmental and somatic dysfunction of lower extremity: Secondary | ICD-10-CM | POA: Diagnosis not present

## 2021-05-29 ENCOUNTER — Ambulatory Visit (INDEPENDENT_AMBULATORY_CARE_PROVIDER_SITE_OTHER): Payer: PPO | Admitting: Family Medicine

## 2021-06-02 ENCOUNTER — Other Ambulatory Visit (INDEPENDENT_AMBULATORY_CARE_PROVIDER_SITE_OTHER): Payer: Self-pay | Admitting: Family Medicine

## 2021-06-02 DIAGNOSIS — F4323 Adjustment disorder with mixed anxiety and depressed mood: Secondary | ICD-10-CM

## 2021-06-04 ENCOUNTER — Telehealth: Payer: Self-pay | Admitting: Pharmacist

## 2021-06-04 NOTE — Chronic Care Management (AMB) (Signed)
Patient called wanting to reschedule her appointment. Patient rescheduled her appointment on 06/12/2021 to tomorrow 06/05/2021 at 11:00. Patient states she is going to be out of town during the time if the original appointment.  Future Appointments  Date Time Provider [place]  06/05/2021 11:00 AM [facility] CCM PHARMACIST [facility]  06/13/2021  8:50 AM [facility] MM 2 [facility]  06/14/2021  9:40 AM [name], DO [facility] None  07/17/2021  9:00 AM Leamon Arnt, MD [facility]  07/18/2021  2:40 PM [name], DO [facility] None  10/02/2021  1:45 PM Deboraha Sprang, MD [facility]  11/19/2021  1:00 PM [facility] HEALTH COACH [facility]   April D Calhoun, [place] Pharmacist Assistant [phone]

## 2021-06-04 NOTE — Telephone Encounter (Signed)
Dr.Wallace °

## 2021-06-05 ENCOUNTER — Ambulatory Visit (INDEPENDENT_AMBULATORY_CARE_PROVIDER_SITE_OTHER): Payer: PPO | Admitting: Pharmacist

## 2021-06-05 DIAGNOSIS — F4323 Adjustment disorder with mixed anxiety and depressed mood: Secondary | ICD-10-CM | POA: Diagnosis not present

## 2021-06-05 DIAGNOSIS — R7303 Prediabetes: Secondary | ICD-10-CM

## 2021-06-05 NOTE — Patient Instructions (Addendum)
Visit Information   Goals Addressed             This Visit's Progress    Track and Manage My Symptoms-Depression       Timeframe:  Long-Range Goal Priority:  High Start Date:  06/05/21                           Expected End Date:   02/30/22                    Follow Up Date 09/05/21    - exercise at least 2 to 3 times per week    Why is this important?   Keeping track of your progress will help your treatment team find the right mix of medicine and therapy for you.  Write in your journal every day.  Day-to-day changes in depression symptoms are normal. It may be more helpful to check your progress at the end of each week instead of every day.     Notes:        Patient Care Plan: CCM Pharmacy Care Plan     Problem Identified: HLD Hypothyroidism Pre-DM      Long-Range Goal: Disease Management   Start Date: 11/14/2020  Expected End Date: 11/14/2021  Recent Progress: On track  Priority: High  Note:   Current Barriers:  No specific barriers  Pharmacist Clinical Goal(s):  Over the next 365 days, patient will verbalize ability to afford treatment regimen achieve adherence to monitoring guidelines and medication adherence to achieve therapeutic efficacy through collaboration with PharmD and provider.   Interventions: 1:1 collaboration with Leamon Arnt, MD regarding development and update of comprehensive plan of care as evidenced by provider attestation and co-signature Inter-disciplinary care team collaboration (see longitudinal plan of care) Comprehensive medication review performed; medication list updated in electronic medical record  Hyperlipidemia: (LDL goal < 100) -controlled -Current treatment: Pravastatin 20 mg  Lovaza 1 gram two times daily  -Medications previously tried: Pravastatin 10 mg  -Current dietary patterns: see hyperglycemia -Current exercise habits: see hyperglycemia  -Educated on Benefits of statin for ASCVD risk reduction; Importance of  limiting foods high in cholesterol; Exercise goal of 150 minutes per week; -Counseled on diet and exercise extensively Recommended to continue current medication  Prediabetes/hyperglycemia (A1c goal <6.5%) -controlled -Current medications: Ozempic '2mg'$  into the skin once weekly -Medications previously tried: N/A  -Current home glucose readings fasting glucose: N/A post prandial glucose: N/A -Denies hypoglycemic/hyperglycemic symptoms -Current meal patterns:  breakfast: wellness maintenance - protein/carb focus. 2 eggs, 45 calorie toast, small clementine, coffee. Apple   lunch: chicken vegetable soup, crackers, water. Might have 60 calorie yogurt w/ probiotic+ fruit (1-2x/day)  dinner: salad, bigger portion of meat. Chicken, salmon. Tries to avoid red meats snacks: low fat cheese drinks: water with most meals -Current exercise: walking, pilates -Educated on A1c and blood sugar goals; Exercise goal of 150 minutes per week; Benefits of weight loss; -Counseled to check feet daily and get yearly eye exams -Counseled on diet and exercise extensively  Update 06/05/21 Denies any concern with Ozempic.  Assessed finances - no issues with her copays at this time.  Denies any symptoms of hypoglycemia.  PVCs -controlled  -Current treatment: Acebutolol 200 mg once daily  Flecainide 50 mg tablet - 1.5 tablets two times daily -Medications previously tried: n/a -Counseled on medications, reviewed side effects - no problems noted -Recommended to continue current medication  Depression/Anxiety (Goal: minimize symptoms,  ensure medication safety) -controlled -Current treatment: Sertraline 75 mg once daily -Medications previously tried/failed: sertraline 25 mg once daily, amitriptyline 25 mg once daily at bedtime, trazodone 25 mg  -Educated on Benefits of medication for symptom control -Recommended to continue current medication  Update 06/05/21 PHQ9 SCORE ONLY 11/13/2020 07/03/2020 10/07/2019   PHQ-9 Total Score 0 9 0  Dose recently increased to 1.5 tablets once daily.  No concerns at this time.  Reports stable mood. Continue current meds  Hypothyroidism (Goal: ensure safe/effective medication use) -controlled -Current treatment  Levothyroxine 112 mcg once daily -Medications previously tried: n/a  -Recommended to continue current medication  Patient Goals/Self-Care Activities Over the next 365 days, patient will:  - take medications as prescribed collaborate with provider on medication access solutions  Follow Up Plan: Telephone follow up appointment with care management team member scheduled for: 6 months       Patient verbalizes understanding of instructions provided today and agrees to view in [place].  Telephone follow up appointment with pharmacy team member scheduled for: 6 months  Edythe Clarity, [place]

## 2021-06-05 NOTE — Progress Notes (Signed)
Chronic Care Management Pharmacy Note  06/05/2021 Name:  Mary Hernandez MRN:  [id number] DOB:  Dec 26, 1950  Subjective: Mary Hernandez is an 70 y.o. year old female who is a primary patient of Leamon Arnt, MD.  The CCM team was consulted for assistance with disease management and care coordination needs.    Engaged with patient by telephone for initial visit in response to provider referral for pharmacy case management and/or care coordination services.   Consent to Services:  The patient was given the following information about Chronic Care Management services today, agreed to services, and gave verbal consent: 1. CCM service includes personalized support from designated clinical staff supervised by the primary care provider, including individualized plan of care and coordination with other care providers 2. 24/7 contact phone numbers for assistance for urgent and routine care needs. 3. Service will only be billed when office clinical staff spend 20 minutes or more in a month to coordinate care. 4. Only one practitioner may furnish and bill the service in a calendar month. 5.The patient may stop CCM services at any time (effective at the end of the month) by phone call to the office staff. 6. The patient will be responsible for cost sharing (co-pay) of up to 20% of the service fee (after annual deductible is met). Patient agreed to services and consent obtained.  Patient Care Team: Leamon Arnt, MD as PCP - General (Family Medicine) Deboraha Sprang, MD as PCP - Electrophysiology (Cardiology) Gerda Diss, DO as Consulting Physician (Sports Medicine) Debbra Riding, MD as Consulting Physician (Ophthalmology) Deboraha Sprang, MD as Consulting Physician (Cardiology) Magnus Sinning, MD as Consulting Physician (Physical Medicine and Rehabilitation) Edythe Clarity, [facility] (Pharmacist)  Objective:  Lab Results  Component Value Date   CREATININE 0.56 (L) 01/29/2021    BUN 17 01/29/2021   GFR 84.79 10/07/2019   GFRNONAA 96 10/18/2020   GFRAA 111 10/18/2020   NA 141 01/29/2021   K 3.8 01/29/2021   CALCIUM 9.1 01/29/2021   CO2 21 01/29/2021    Lab Results  Component Value Date/Time   HGBA1C 6.0 (H) 01/29/2021 12:19 PM   HGBA1C 5.9 (H) 10/12/2020 02:12 PM   HGBA1C 5.7 11/27/2017 12:00 AM   GFR 84.79 10/07/2019 09:57 AM   GFR 97.86 05/17/2019 08:32 AM   MICROALBUR <0.7 05/01/2018 09:55 AM    Last diabetic Eye exam:  Lab Results  Component Value Date/Time   HMDIABEYEEXA No Retinopathy 03/27/2018 12:00 AM    Last diabetic Foot exam: No results found for: HMDIABFOOTEX   Lab Results  Component Value Date   CHOL 173 10/12/2020   HDL 57 10/12/2020   LDLCALC 90 10/12/2020   LDLDIRECT 94.0 05/01/2018   TRIG 149 10/12/2020   CHOLHDL 2.8 06/21/2020    Hepatic Function Latest Ref Rng & Units 01/29/2021 10/12/2020 07/03/2020  Total Protein 6.0 - 8.5 g/dL 7.3 7.1 7.4  Albumin 3.8 - 4.8 g/dL 4.4 4.4 4.9(H)  AST 0 - 40 IU/L _0 ALT 0 - 32 IU/L _1 Alk Phosphatase 44 - 121 IU/L 64 76 73  Total Bilirubin 0.0 - 1.2 mg/dL 0.3 0.2 0.4    Lab Results  Component Value Date/Time   TSH 0.557 10/12/2020 02:12 PM   TSH 0.51 06/21/2020 10:12 AM   FREET4 1.11 05/19/2019 04:33 PM   FREET4 0.98 10/12/2018 09:55 AM    CBC Latest Ref Rng & Units 06/21/2020 10/07/2019 05/17/2019  WBC 3.8 -  10.8 Thousand/uL 6.0 7.3 6.2  Hemoglobin 11.7 - 15.5 g/dL 13.7 13.4 13.2  Hematocrit 35.0 - 45.0 % 41.2 40.7 39.3  Platelets 140 - 400 Thousand/uL 223 250.0 213.0    Lab Results  Component Value Date/Time   VD25OH 52.3 07/03/2020 12:22 PM    Clinical ASCVD: No  The 10-year ASCVD risk score Mikey Bussing DC Jr., et al., 2013) is: 11%   Values used to calculate the score:     Age: 42 years     Sex: Female     Is Non-Hispanic African American: No     Diabetic: Yes     Tobacco smoker: No     Systolic Blood Pressure: 99 mmHg     Is BP treated: Yes     HDL  Cholesterol: 57 mg/dL     Total Cholesterol: 173 mg/dL    Depression screen [facility] 2/9 11/13/2020 07/03/2020 10/07/2019  Decreased Interest 0 2 0  Down, Depressed, Hopeless 0 1 0  PHQ - 2 Score 0 3 0  Altered sleeping - 0 -  Tired, decreased energy - 2 -  Change in appetite - 2 -  Feeling bad or failure about yourself  - 2 -  Trouble concentrating - 0 -  Moving slowly or fidgety/restless - 0 -  Suicidal thoughts - 0 -  PHQ-9 Score - 9 -  Difficult doing work/chores - Not difficult at all -  Some recent data might be hidden    Social History   Tobacco Use  Smoking Status Never  Smokeless Tobacco Never   BP Readings from Last 3 Encounters:  05/02/21 (!) 99/58  04/12/21 (!) 95/59  03/29/21 (!) 94/58   Pulse Readings from Last 3 Encounters:  05/02/21 71  04/12/21 65  03/29/21 65   Wt Readings from Last 3 Encounters:  05/02/21 171 lb (77.6 kg)  04/12/21 174 lb (78.9 kg)  03/29/21 175 lb (79.4 kg)    Assessment/Interventions: Review of patient past medical history, allergies, medications, health status, including review of consultants reports, laboratory and other test data, was performed as part of comprehensive evaluation and provision of chronic care management services.   SDOH:  (Social Determinants of Health) assessments and interventions performed: Yes   CCM Care Plan  Allergies  Allergen Reactions   Seasonal Ic [Cholestatin]    Adhesive [Tape] Rash    Medications Reviewed Today     Reviewed by Edythe Clarity, [facility] (Pharmacist) on 06/05/21 at 1628  Med List Status: <None>   Medication Order Taking? Sig Documenting Provider Last Dose Status Informant  acebutolol (SECTRAL) 200 MG capsule [id number] Yes Take 1 capsule (200 mg total) by mouth daily. Deboraha Sprang, MD Taking Active   Calcium Citrate-Vitamin D 315-250 MG-UNIT TABS [id number] Yes Take by mouth. [provider] Taking Active   cetirizine (ZYRTEC) 10 MG tablet [id number] Yes Take 10 mg by  mouth daily. [provider] Taking Active   diclofenac Sodium (VOLTAREN) 1 % GEL [id number] Yes Apply 2 g topically 4 (four) times daily. Leamon Arnt, MD Taking Active   EVENING PRIMROSE OIL PO [id number] Yes Take 1,500 mg by mouth 2 (two) times daily. [provider] Taking Active   flecainide (TAMBOCOR) 50 MG tablet [id number] Yes Take 1.5 tablets (75 mg total) by mouth 2 (two) times daily. Constance Haw, MD Taking Active   fluticasone [facility]) 50 MCG/ACT nasal spray [id number] Yes Place 2 sprays into both nostrils daily. Leamon Arnt, MD Taking Active  Glucosamine-Chondroit-Vit C-Mn (GLUCOSAMINE 1500 COMPLEX) CAPS [id number] Yes Take by mouth. [provider] Taking Active   glycopyrrolate (ROBINUL) 1 MG tablet [id number] Yes Take 1 tablet (1 mg total) by mouth 2 (two) times daily. Leamon Arnt, MD Taking Active   hydrochlorothiazide (HYDRODIURIL) 25 MG tablet [id number] Yes Take 1 tablet (25 mg total) by mouth daily. [name], DO Taking Active   levothyroxine (SYNTHROID) 112 MCG tablet [id number] Yes TAKE ONE TABLET BY MOUTH DAILY Leamon Arnt, MD Taking Active   naltrexone (DEPADE) 50 MG tablet [id number] Yes Take 50 mg by mouth daily. 1/2 tablet am for 1 week, then 1/2 am and 1/2 pm after 1 week [provider] Taking Active   omega-3 acid ethyl esters (LOVAZA) 1 g capsule [id number] Yes Take 1 g by mouth 2 (two) times daily. [provider] Taking Active   ondansetron (ZOFRAN-ODT) 4 MG disintegrating tablet [id number] Yes Take 4 mg by mouth every 8 (eight) hours as needed for nausea or vomiting. [provider] Taking Active   Potassium Chloride ER 20 MEQ TBCR [id number] Yes Take 2 tablets (40 meq) in the AM and take 1 tablet (20 meq) in the PM as by mouth as directed  Patient taking differently: Take 20 mEq by mouth daily. Take 2 tablets (40 meq) in the AM and take 1 tablet (20 meq) in the PM as by mouth as directed    Shirley Friar, PA-C Taking Active   pravastatin (PRAVACHOL) 20 MG tablet [id number] Yes Take 1 tablet (20 mg total) by mouth at bedtime. Leamon Arnt, MD Taking Active   pregabalin (LYRICA) 75 MG capsule [id number] Yes Take 75 mg by mouth 2 (two) times daily. [provider] Taking Active   Probiotic Product (PROBIOTIC-10 PO) [id number] Yes Take by mouth. [provider] Taking Active   Semaglutide, 2 MG/DOSE, (OZEMPIC, 2 MG/DOSE,) 8 MG/3ML SOPN [id number] Yes Inject 2 mg into the skin once a week. [name], DO Taking Active   sertraline (ZOLOFT) 50 MG tablet [id number] Yes TAKE ONE AND ONE-HALF TABLETS BY MOUTH DAILY [name], DO Taking Active   TART CHERRY PO [id number] Yes Take by mouth. [provider] Taking Active   Turmeric 400 MG CAPS [id number] Yes Take by mouth. [provider] Taking Active   UNABLE TO FIND [id number] Yes Inject 2 mg into the skin once a week. Med Name: Ozempic 2 mg dose (8 mg/3 mL subcutaneous pen injector) [name], DO Taking Active             Patient Active Problem List   Diagnosis Date Noted   Obesity, Class I, BMI 30-34.9 10/07/2019   Stress reaction 10/07/2019   Notalgia paresthetica 07/01/2019   Insomnia due to medical condition 10/18/2018   Prediabetes 10/12/2018   History of rotator cuff syndrome with tear and adhesive capsulitis 09/11/2018   History of lateral epicondylitis of right elbow 06/25/2018   Diverticulosis 03/30/2018   GERD (gastroesophageal reflux disease) 03/30/2018   Mixed hyperlipidemia 03/01/2018   Acquired hypothyroidism 03/01/2018   Meniere disease 03/01/2018   Frequent PVCs 03/01/2018   Immunization History  Administered Date(s) Administered   Fluad Quad(high Dose 65+) 06/08/2019, 06/21/2020   Hepatitis A 06/18/2004   Hepatitis B 06/18/2004   Influenza, High Dose Seasonal PF 06/16/2018   PFIZER(Purple Top)SARS-COV-2 Vaccination 11/14/2019, 12/09/2019, 07/07/2020    Pneumococcal Conjugate-13 09/14/2015   Pneumococcal Polysaccharide-23 06/10/2017   Tdap 06/18/2017   Zoster Recombinat (Shingrix) 06/18/2017, 11/06/2017   Zoster,  Live 08/22/2010   Conditions to be addressed/monitored:  Hyperlipidemia and Hypothyroidism  Care Plan : CCM Pharmacy Care Plan  Updates made by Edythe Clarity, RPH since 06/05/2021 12:00 AM     Problem: HLD Hypothyroidism Pre-DM      Long-Range Goal: Disease Management   Start Date: 11/14/2020  Expected End Date: 11/14/2021  Recent Progress: On track  Priority: High  Note:   Current Barriers:  No specific barriers  Pharmacist Clinical Goal(s):  Over the next 365 days, patient will verbalize ability to afford treatment regimen achieve adherence to monitoring guidelines and medication adherence to achieve therapeutic efficacy through collaboration with PharmD and provider.   Interventions: 1:1 collaboration with Leamon Arnt, MD regarding development and update of comprehensive plan of care as evidenced by provider attestation and co-signature Inter-disciplinary care team collaboration (see longitudinal plan of care) Comprehensive medication review performed; medication list updated in electronic medical record  Hyperlipidemia: (LDL goal < 100) -controlled -Current treatment: Pravastatin 20 mg  Lovaza 1 gram two times daily  -Medications previously tried: Pravastatin 10 mg  -Current dietary patterns: see hyperglycemia -Current exercise habits: see hyperglycemia  -Educated on Benefits of statin for ASCVD risk reduction; Importance of limiting foods high in cholesterol; Exercise goal of 150 minutes per week; -Counseled on diet and exercise extensively Recommended to continue current medication  Prediabetes/hyperglycemia (A1c goal <6.5%) -controlled -Current medications: Ozempic 36m into the skin once weekly -Medications previously tried: N/A  -Current home glucose readings fasting glucose: N/A post  prandial glucose: N/A -Denies hypoglycemic/hyperglycemic symptoms -Current meal patterns:  breakfast: wellness maintenance - protein/carb focus. 2 eggs, 45 calorie toast, small clementine, coffee. Apple   lunch: chicken vegetable soup, crackers, water. Might have 60 calorie yogurt w/ probiotic+ fruit (1-2x/day)  dinner: salad, bigger portion of meat. Chicken, salmon. Tries to avoid red meats snacks: low fat cheese drinks: water with most meals -Current exercise: walking, pilates -Educated on A1c and blood sugar goals; Exercise goal of 150 minutes per week; Benefits of weight loss; -Counseled to check feet daily and get yearly eye exams -Counseled on diet and exercise extensively  Update 06/05/21 Denies any concern with Ozempic.  Assessed finances - no issues with her copays at this time.  Denies any symptoms of hypoglycemia.  PVCs -controlled  -Current treatment: Acebutolol 200 mg once daily  Flecainide 50 mg tablet - 1.5 tablets two times daily -Medications previously tried: n/a -Counseled on medications, reviewed side effects - no problems noted -Recommended to continue current medication  Depression/Anxiety (Goal: minimize symptoms, ensure medication safety) -controlled -Current treatment: Sertraline 75 mg once daily -Medications previously tried/failed: sertraline 25 mg once daily, amitriptyline 25 mg once daily at bedtime, trazodone 25 mg  -Educated on Benefits of medication for symptom control -Recommended to continue current medication  Update 06/05/21 PHQ9 SCORE ONLY 11/13/2020 07/03/2020 10/07/2019  PHQ-9 Total Score 0 9 0  Dose recently increased to 1.5 tablets once daily.  No concerns at this time.  Reports stable mood. Continue current meds  Hypothyroidism (Goal: ensure safe/effective medication use) -controlled -Current treatment  Levothyroxine 112 mcg once daily -Medications previously tried: n/a  -Recommended to continue current medication  Patient  Goals/Self-Care Activities Over the next 365 days, patient will:  - take medications as prescribed collaborate with provider on medication access solutions  Follow Up Plan: Telephone follow up appointment with care management team member scheduled for: 6 months      Medication Assistance: None at this time  Patient's preferred pharmacy is:  [name] [address], [place] - [place] [address] Phone: [phone] Fax: [phone]  We discussed: Benefits of medication synchronization, packaging and delivery as well as enhanced pharmacist oversight with [employer]. Patient decided to: Continue current management  Follow Up:  Patient agrees to Care Plan and Follow-up.  Beverly Milch, PharmD Clinical Pharmacist [phone]

## 2021-06-12 ENCOUNTER — Telehealth: Payer: PPO

## 2021-06-13 ENCOUNTER — Ambulatory Visit
Admission: RE | Admit: 2021-06-13 | Discharge: 2021-06-13 | Disposition: A | Discharge status: Home / Self Care | Payer: PPO | Source: Ambulatory Visit | Attending: Family Medicine | Admitting: Family Medicine

## 2021-06-13 ENCOUNTER — Other Ambulatory Visit: Payer: Self-pay

## 2021-06-13 DIAGNOSIS — Z1231 Encounter for screening mammogram for malignant neoplasm of breast: Secondary | ICD-10-CM | POA: Diagnosis not present

## 2021-06-14 ENCOUNTER — Ambulatory Visit (INDEPENDENT_AMBULATORY_CARE_PROVIDER_SITE_OTHER): Payer: PPO | Admitting: Family Medicine

## 2021-06-14 ENCOUNTER — Encounter (INDEPENDENT_AMBULATORY_CARE_PROVIDER_SITE_OTHER): Payer: Self-pay | Admitting: Family Medicine

## 2021-06-14 VITALS — BP 100/60 | HR 68 | Temp 98.3°F | Ht 65.0 in | Wt 169.0 lb

## 2021-06-14 DIAGNOSIS — E039 Hypothyroidism, unspecified: Secondary | ICD-10-CM

## 2021-06-14 DIAGNOSIS — E8881 Metabolic syndrome: Secondary | ICD-10-CM

## 2021-06-14 DIAGNOSIS — E669 Obesity, unspecified: Secondary | ICD-10-CM | POA: Diagnosis not present

## 2021-06-14 DIAGNOSIS — Z79899 Other long term (current) drug therapy: Secondary | ICD-10-CM | POA: Diagnosis not present

## 2021-06-14 DIAGNOSIS — E782 Mixed hyperlipidemia: Secondary | ICD-10-CM

## 2021-06-14 DIAGNOSIS — E559 Vitamin D deficiency, unspecified: Secondary | ICD-10-CM

## 2021-06-14 DIAGNOSIS — R7303 Prediabetes: Secondary | ICD-10-CM | POA: Diagnosis not present

## 2021-06-14 DIAGNOSIS — Z6831 Body mass index (BMI) 31.0-31.9, adult: Secondary | ICD-10-CM

## 2021-06-15 ENCOUNTER — Encounter: Payer: Self-pay | Admitting: Family Medicine

## 2021-06-15 LAB — CBC WITH DIFFERENTIAL/PLATELET
Basophils Absolute: 0 10*3/uL (ref 0.0–0.2)
Basos: 1 %
EOS (ABSOLUTE): 0.1 10*3/uL (ref 0.0–0.4)
Eos: 2 %
Hematocrit: 42.3 % (ref 34.0–46.6)
Hemoglobin: 14.2 g/dL (ref 11.1–15.9)
Immature Grans (Abs): 0 10*3/uL (ref 0.0–0.1)
Immature Granulocytes: 0 %
Lymphocytes Absolute: 2.3 10*3/uL (ref 0.7–3.1)
Lymphs: 37 %
MCH: 29.8 pg (ref 26.6–33.0)
MCHC: 33.6 g/dL (ref 31.5–35.7)
MCV: 89 fL (ref 79–97)
Monocytes Absolute: 0.3 10*3/uL (ref 0.1–0.9)
Monocytes: 5 %
Neutrophils Absolute: 3.4 10*3/uL (ref 1.4–7.0)
Neutrophils: 55 %
Platelets: 243 10*3/uL (ref 150–450)
RBC: 4.76 x10E6/uL (ref 3.77–5.28)
RDW: 13.2 % (ref 11.7–15.4)
WBC: 6.1 10*3/uL (ref 3.4–10.8)

## 2021-06-15 LAB — COMPREHENSIVE METABOLIC PANEL
ALT: 36 IU/L — ABNORMAL HIGH (ref 0–32)
AST: 27 IU/L (ref 0–40)
Albumin/Globulin Ratio: 1.6 (ref 1.2–2.2)
Albumin: 4.5 g/dL (ref 3.8–4.8)
Alkaline Phosphatase: 75 IU/L (ref 44–121)
BUN/Creatinine Ratio: 36 — ABNORMAL HIGH (ref 12–28)
BUN: 16 mg/dL (ref 8–27)
Bilirubin Total: 0.3 mg/dL (ref 0.0–1.2)
CO2: 25 mmol/L (ref 20–29)
Calcium: 8.9 mg/dL (ref 8.7–10.3)
Chloride: 100 mmol/L (ref 96–106)
Creatinine, Ser: 0.45 mg/dL — ABNORMAL LOW (ref 0.57–1.00)
Globulin, Total: 2.8 g/dL (ref 1.5–4.5)
Glucose: 95 mg/dL (ref 65–99)
Potassium: 3.6 mmol/L (ref 3.5–5.2)
Sodium: 140 mmol/L (ref 134–144)
Total Protein: 7.3 g/dL (ref 6.0–8.5)
eGFR: 105 mL/min/{1.73_m2} (ref >59)

## 2021-06-15 LAB — LIPID PANEL
Chol/HDL Ratio: 2.9 ratio (ref 0.0–4.4)
Cholesterol, Total: 185 mg/dL (ref 100–199)
HDL: 63 mg/dL (ref >39)
LDL Chol Calc (NIH): 102 mg/dL — ABNORMAL HIGH (ref 0–99)
Triglycerides: 112 mg/dL (ref 0–149)
VLDL Cholesterol Cal: 20 mg/dL (ref 5–40)

## 2021-06-15 LAB — INSULIN, RANDOM: INSULIN: 17.4 u[IU]/mL (ref 2.6–24.9)

## 2021-06-15 LAB — TSH: TSH: 0.182 u[IU]/mL — ABNORMAL LOW (ref 0.450–4.500)

## 2021-06-15 LAB — VITAMIN D 25 HYDROXY (VIT D DEFICIENCY, FRACTURES): Vit D, 25-Hydroxy: 46.6 ng/mL (ref 30.0–100.0)

## 2021-06-15 LAB — HEMOGLOBIN A1C
Est. average glucose Bld gHb Est-mCnc: 123 mg/dL
Hgb A1c MFr Bld: 5.9 % — ABNORMAL HIGH (ref 4.8–5.6)

## 2021-06-15 LAB — VITAMIN B12: Vitamin B-12: 480 pg/mL (ref 232–1245)

## 2021-06-15 LAB — T4, FREE: Free T4: 1.65 ng/dL (ref 0.82–1.77)

## 2021-06-18 ENCOUNTER — Encounter (INDEPENDENT_AMBULATORY_CARE_PROVIDER_SITE_OTHER): Payer: Self-pay | Admitting: Family Medicine

## 2021-06-18 DIAGNOSIS — M545 Low back pain, unspecified: Secondary | ICD-10-CM | POA: Diagnosis not present

## 2021-06-18 DIAGNOSIS — M9906 Segmental and somatic dysfunction of lower extremity: Secondary | ICD-10-CM | POA: Diagnosis not present

## 2021-06-18 DIAGNOSIS — M9902 Segmental and somatic dysfunction of thoracic region: Secondary | ICD-10-CM | POA: Diagnosis not present

## 2021-06-18 DIAGNOSIS — M9904 Segmental and somatic dysfunction of sacral region: Secondary | ICD-10-CM | POA: Diagnosis not present

## 2021-06-18 DIAGNOSIS — M9905 Segmental and somatic dysfunction of pelvic region: Secondary | ICD-10-CM | POA: Diagnosis not present

## 2021-06-18 DIAGNOSIS — G4486 Cervicogenic headache: Secondary | ICD-10-CM | POA: Diagnosis not present

## 2021-06-18 DIAGNOSIS — M542 Cervicalgia: Secondary | ICD-10-CM | POA: Diagnosis not present

## 2021-06-18 DIAGNOSIS — M25551 Pain in right hip: Secondary | ICD-10-CM | POA: Diagnosis not present

## 2021-06-18 DIAGNOSIS — M9901 Segmental and somatic dysfunction of cervical region: Secondary | ICD-10-CM | POA: Diagnosis not present

## 2021-06-18 DIAGNOSIS — M9903 Segmental and somatic dysfunction of lumbar region: Secondary | ICD-10-CM | POA: Diagnosis not present

## 2021-06-18 NOTE — Progress Notes (Signed)
Chief Complaint:   OBESITY Tashi is here to discuss her progress with her obesity treatment plan along with follow-up of her obesity related diagnoses.   Today's visit was #: 15 Starting weight: 192 lbs Starting date: 07/03/2020 Today's weight: 169 lbs Today's date: 06/14/2021 Weight change since last visit: 2 lbs Total lbs lost to date: 23 lbs Body mass index is 28.12 kg/m.  Total weight loss percentage to date: -11.98%  Current Meal Plan: the Category 2 Plan for 60% of the time.  Current Exercise Plan: Pilates/water aerobics/walking for 60 minutes 2-4 times per week. Current Anti-Obesity Medications: Ozempic 2 mg subcutaneously weekly. Side effects: None.  Interim History:  Ardith says she is doing well and is happy with her progress.  Assessment/Plan:   1. Prediabetes Not optimized. Goal is HgbA1c < 5.7.  Medication: Ozempic 2 mg subcutaneously weekly.    Plan:  Continue Ozempic.  Will check labs today.  She will continue to focus on protein-rich, low simple carbohydrate foods. We reviewed the importance of hydration, regular exercise for stress reduction, and restorative sleep.   Lab Results  Component Value Date   HGBA1C 5.9 (H) 06/14/2021   Lab Results  Component Value Date   INSULIN 17.4 06/14/2021   INSULIN 27.5 (H) 10/12/2020   INSULIN 21.9 07/03/2020   - Comprehensive metabolic panel - Hemoglobin A1c - Insulin, random  2. Metabolic syndrome Starting goal: Lose 7-10% of starting weight. She will continue to focus on protein-rich, low simple carbohydrate foods. We reviewed the importance of hydration, regular exercise for stress reduction, and restorative sleep.  We will continue to check lab work every 3 months, with 10% weight loss, or should any other concerns arise.  3. Vitamin D deficiency Not optimized.   Plan:  Will check vitamin D level today.  Lab Results  Component Value Date   VD25OH 46.6 06/14/2021   VD25OH 52.3 07/03/2020   - VITAMIN D  25 Hydroxy (Vit-D Deficiency, Fractures)  4. Acquired hypothyroidism Medication: levothyroxine 112 mcg daily.   Plan: Patient was instructed not to take MVM or iron within 4 hours of taking thyroid medications. This issue is managed by Dr. Jonni Sanger. We will continue to monitor alongside Endocrinology/PCP as it relates to her weight loss journey.  Will check TSH and free T4 today.  Lab Results  Component Value Date   TSH 0.182 (L) 06/14/2021   - TSH - T4, free  5. Mixed hyperlipidemia Course: Not optimized. Lipid-lowering medications: pravastatin 20 mg daily.   Plan: Dietary changes: Increase soluble fiber, decrease simple carbohydrates, decrease saturated fat. Exercise changes: Moderate to vigorous-intensity aerobic activity 150 minutes per week or as tolerated. We will continue to monitor along with PCP/specialists as it pertains to her weight loss journey.  Will check lipid panel today.  Lab Results  Component Value Date   CHOL 185 06/14/2021   HDL 63 06/14/2021   LDLCALC 102 (H) 06/14/2021   LDLDIRECT 94.0 05/01/2018   TRIG 112 06/14/2021   CHOLHDL 2.9 06/14/2021   Lab Results  Component Value Date   ALT 36 (H) 06/14/2021   AST 27 06/14/2021   ALKPHOS 75 06/14/2021   BILITOT 0.3 06/14/2021   The 10-year ASCVD risk score (Arnett DK, et al., 2019) is: 11.1%   Values used to calculate the score:     Age: 5 years     Sex: Female     Is Non-Hispanic African American: No     Diabetic: Yes  Tobacco smoker: No     Systolic Blood Pressure: 123XX123 mmHg     Is BP treated: Yes     HDL Cholesterol: 63 mg/dL     Total Cholesterol: 185 mg/dL  - Lipid panel  6. Medication management Will check CBC w/diff and vitamin B12 level today.  - CBC with Differential/Platelet - Vitamin B12  7. Obesity BMI today 28.2  Course: Kileen is currently in the action stage of change. As such, her goal is to continue with weight loss efforts.   Nutrition goals: She has agreed to the Category 2  Plan.   Exercise goals:  As is.  Behavioral modification strategies: increasing lean protein intake, decreasing simple carbohydrates, increasing vegetables, increasing water intake, and decreasing liquid calories.  Emilda has agreed to follow-up with our clinic in 4 weeks. She was informed of the importance of frequent follow-up visits to maximize her success with intensive lifestyle modifications for her multiple health conditions.   Objective:   Blood pressure 100/60, pulse 68, temperature 98.3 F (36.8 C), temperature source Oral, height '5\' 5"'$  (1.651 m), weight 169 lb (76.7 kg), SpO2 96 %. Body mass index is 28.12 kg/m.  General: Cooperative, alert, well developed, in no acute distress. HEENT: Conjunctivae and lids unremarkable. Cardiovascular: Regular rhythm.  Lungs: Normal work of breathing. Neurologic: No focal deficits.   Lab Results  Component Value Date   CREATININE 0.45 (L) 06/14/2021   BUN 16 06/14/2021   NA 140 06/14/2021   K 3.6 06/14/2021   CL 100 06/14/2021   CO2 25 06/14/2021   Lab Results  Component Value Date   ALT 36 (H) 06/14/2021   AST 27 06/14/2021   ALKPHOS 75 06/14/2021   BILITOT 0.3 06/14/2021   Lab Results  Component Value Date   HGBA1C 5.9 (H) 06/14/2021   HGBA1C 6.0 (H) 01/29/2021   HGBA1C 5.9 (H) 10/12/2020   HGBA1C 6.2 (H) 06/21/2020   HGBA1C 5.9 (A) 10/07/2019   Lab Results  Component Value Date   INSULIN 17.4 06/14/2021   INSULIN 27.5 (H) 10/12/2020   INSULIN 21.9 07/03/2020   Lab Results  Component Value Date   TSH 0.182 (L) 06/14/2021   Lab Results  Component Value Date   CHOL 185 06/14/2021   HDL 63 06/14/2021   LDLCALC 102 (H) 06/14/2021   LDLDIRECT 94.0 05/01/2018   TRIG 112 06/14/2021   CHOLHDL 2.9 06/14/2021   Lab Results  Component Value Date   VD25OH 46.6 06/14/2021   VD25OH 52.3 07/03/2020   Lab Results  Component Value Date   WBC 6.1 06/14/2021   HGB 14.2 06/14/2021   HCT 42.3 06/14/2021   MCV 89  06/14/2021   PLT 243 06/14/2021   Obesity Behavioral Intervention:   Approximately 15 minutes were spent on the discussion below.  ASK: We discussed the diagnosis of obesity with Edd Fabian today and Zamya agreed to give [place] permission to discuss obesity behavioral modification therapy today.  ASSESS: Alysiana has the diagnosis of obesity and her BMI today is 28.2. Sibyle is in the action stage of change.   ADVISE: Melyna was educated on the multiple health risks of obesity as well as the benefit of weight loss to improve her health. She was advised of the need for long term treatment and the importance of lifestyle modifications to improve her current health and to decrease her risk of future health problems.  AGREE: Multiple dietary modification options and treatment options were discussed and Anaika agreed to follow the recommendations  documented in the above note.  ARRANGE: Latanja was educated on the importance of frequent visits to treat obesity as outlined per CMS and USPSTF guidelines and agreed to schedule her next follow up appointment today.  Attestation Statements:   Reviewed by clinician on day of visit: allergies, medications, problem list, medical history, surgical history, family history, social history, and previous encounter notes.  I, Water quality scientist, CMA, am acting as transcriptionist for [name], DO  I have reviewed the above documentation for accuracy and completeness, and I agree with the above. [name], DO

## 2021-06-18 NOTE — Telephone Encounter (Signed)
Please review and advise.

## 2021-06-22 ENCOUNTER — Encounter: Payer: PPO | Admitting: Family Medicine

## 2021-07-05 DIAGNOSIS — S0502XA Injury of conjunctiva and corneal abrasion without foreign body, left eye, initial encounter: Secondary | ICD-10-CM | POA: Diagnosis not present

## 2021-07-06 DIAGNOSIS — S0502XD Injury of conjunctiva and corneal abrasion without foreign body, left eye, subsequent encounter: Secondary | ICD-10-CM | POA: Diagnosis not present

## 2021-07-07 DIAGNOSIS — S0502XD Injury of conjunctiva and corneal abrasion without foreign body, left eye, subsequent encounter: Secondary | ICD-10-CM | POA: Diagnosis not present

## 2021-07-07 DIAGNOSIS — Y92019 Unspecified place in single-family (private) house as the place of occurrence of the external cause: Secondary | ICD-10-CM | POA: Diagnosis not present

## 2021-07-07 DIAGNOSIS — S0502XA Injury of conjunctiva and corneal abrasion without foreign body, left eye, initial encounter: Secondary | ICD-10-CM | POA: Diagnosis not present

## 2021-07-09 DIAGNOSIS — S0502XD Injury of conjunctiva and corneal abrasion without foreign body, left eye, subsequent encounter: Secondary | ICD-10-CM | POA: Diagnosis not present

## 2021-07-09 DIAGNOSIS — Y92019 Unspecified place in single-family (private) house as the place of occurrence of the external cause: Secondary | ICD-10-CM | POA: Diagnosis not present

## 2021-07-09 DIAGNOSIS — S0502XA Injury of conjunctiva and corneal abrasion without foreign body, left eye, initial encounter: Secondary | ICD-10-CM | POA: Diagnosis not present

## 2021-07-17 ENCOUNTER — Other Ambulatory Visit: Payer: Self-pay

## 2021-07-17 ENCOUNTER — Encounter: Payer: Self-pay | Admitting: Family Medicine

## 2021-07-17 ENCOUNTER — Ambulatory Visit (INDEPENDENT_AMBULATORY_CARE_PROVIDER_SITE_OTHER): Payer: PPO | Admitting: Family Medicine

## 2021-07-17 VITALS — BP 106/64 | HR 65 | Temp 97.7°F | Ht 65.0 in | Wt 174.4 lb

## 2021-07-17 DIAGNOSIS — Z23 Encounter for immunization: Secondary | ICD-10-CM

## 2021-07-17 DIAGNOSIS — H8109 Meniere's disease, unspecified ear: Secondary | ICD-10-CM

## 2021-07-17 DIAGNOSIS — H2513 Age-related nuclear cataract, bilateral: Secondary | ICD-10-CM | POA: Diagnosis not present

## 2021-07-17 DIAGNOSIS — M9902 Segmental and somatic dysfunction of thoracic region: Secondary | ICD-10-CM | POA: Diagnosis not present

## 2021-07-17 DIAGNOSIS — M9906 Segmental and somatic dysfunction of lower extremity: Secondary | ICD-10-CM | POA: Diagnosis not present

## 2021-07-17 DIAGNOSIS — R7303 Prediabetes: Secondary | ICD-10-CM | POA: Diagnosis not present

## 2021-07-17 DIAGNOSIS — E782 Mixed hyperlipidemia: Secondary | ICD-10-CM

## 2021-07-17 DIAGNOSIS — E669 Obesity, unspecified: Secondary | ICD-10-CM

## 2021-07-17 DIAGNOSIS — H35371 Puckering of macula, right eye: Secondary | ICD-10-CM | POA: Diagnosis not present

## 2021-07-17 DIAGNOSIS — M542 Cervicalgia: Secondary | ICD-10-CM | POA: Diagnosis not present

## 2021-07-17 DIAGNOSIS — H0288B Meibomian gland dysfunction left eye, upper and lower eyelids: Secondary | ICD-10-CM | POA: Diagnosis not present

## 2021-07-17 DIAGNOSIS — E039 Hypothyroidism, unspecified: Secondary | ICD-10-CM

## 2021-07-17 DIAGNOSIS — I493 Ventricular premature depolarization: Secondary | ICD-10-CM

## 2021-07-17 DIAGNOSIS — M545 Low back pain, unspecified: Secondary | ICD-10-CM | POA: Diagnosis not present

## 2021-07-17 DIAGNOSIS — Z0000 Encounter for general adult medical examination without abnormal findings: Secondary | ICD-10-CM | POA: Diagnosis not present

## 2021-07-17 DIAGNOSIS — M25551 Pain in right hip: Secondary | ICD-10-CM | POA: Diagnosis not present

## 2021-07-17 DIAGNOSIS — H0288A Meibomian gland dysfunction right eye, upper and lower eyelids: Secondary | ICD-10-CM | POA: Diagnosis not present

## 2021-07-17 DIAGNOSIS — S0500XA Injury of conjunctiva and corneal abrasion without foreign body, unspecified eye, initial encounter: Secondary | ICD-10-CM | POA: Diagnosis not present

## 2021-07-17 DIAGNOSIS — M9901 Segmental and somatic dysfunction of cervical region: Secondary | ICD-10-CM | POA: Diagnosis not present

## 2021-07-17 DIAGNOSIS — F4322 Adjustment disorder with anxiety: Secondary | ICD-10-CM

## 2021-07-17 DIAGNOSIS — R202 Paresthesia of skin: Secondary | ICD-10-CM

## 2021-07-17 DIAGNOSIS — M25552 Pain in left hip: Secondary | ICD-10-CM | POA: Diagnosis not present

## 2021-07-17 DIAGNOSIS — Z9889 Other specified postprocedural states: Secondary | ICD-10-CM | POA: Diagnosis not present

## 2021-07-17 DIAGNOSIS — E119 Type 2 diabetes mellitus without complications: Secondary | ICD-10-CM | POA: Diagnosis not present

## 2021-07-17 DIAGNOSIS — H16223 Keratoconjunctivitis sicca, not specified as Sjogren's, bilateral: Secondary | ICD-10-CM | POA: Diagnosis not present

## 2021-07-17 DIAGNOSIS — Z794 Long term (current) use of insulin: Secondary | ICD-10-CM | POA: Diagnosis not present

## 2021-07-17 LAB — TSH: TSH: 0.29 u[IU]/mL — ABNORMAL LOW (ref 0.35–5.50)

## 2021-07-17 LAB — T4, FREE: Free T4: 1.16 ng/dL (ref 0.60–1.60)

## 2021-07-17 MED ORDER — ROSUVASTATIN CALCIUM 10 MG PO TABS: 10.0000 mg | ORAL_TABLET | Freq: Every day | ORAL | 3 refills | Status: DC

## 2021-07-17 MED ORDER — POTASSIUM CHLORIDE ER 20 MEQ PO TBCR: 20.0000 meq | EXTENDED_RELEASE_TABLET | Freq: Every day | ORAL | Status: DC

## 2021-07-17 MED ORDER — FLUTICASONE PROPIONATE 50 MCG/ACT NA SUSP: 1.0000 | Freq: Every day | NASAL | 11 refills | Status: DC

## 2021-07-17 MED ORDER — HYDROCHLOROTHIAZIDE 25 MG PO TABS: 25.0000 mg | ORAL_TABLET | Freq: Every day | ORAL | 0 refills | Status: DC

## 2021-07-17 MED ORDER — GLYCOPYRROLATE 1 MG PO TABS: 1.0000 mg | ORAL_TABLET | Freq: Two times a day (BID) | ORAL | 3 refills | Status: DC

## 2021-07-17 NOTE — Progress Notes (Signed)
Subjective  Chief Complaint  Patient presents with   Annual Exam   Hyperlipidemia   Gastroesophageal Reflux   Hypothyroidism    HPI: Mary Hernandez is a 70 y.o. female who presents to [place] [facility] today for a Female Wellness Visit. She also has the concerns and/or needs as listed above in the chief complaint. These will be addressed in addition to the Health Maintenance Visit.   Wellness Visit: annual visit with health maintenance review and exam without Pap  HM: screens are current. Due flu vaccine. Eligible for 3rd covid booster. Chronic disease f/u and/or acute problem visit: (deemed necessary to be done in addition to the wellness visit): HLD: LDL above 100 on statin. See labs below. Tolerates statin. Lft minimally elevated Prediabetes on ozempic in part for weight loss. Doing well. Reveiewed notes by Dr. Juleen China Hypothyroidism on supplements: low recent Tsh. Clinically euthyroid.  Menieres' on hctz and potassium; stable. Chronic robinul and requests refill. No ENT here Notalgia paresthetica: derm and SM: trial of lyrica.  Adjustment anxiety: increased to 75 mg of zoloft and doing well.    Assessment  1. Annual physical exam   2. Meniere's disease, unspecified laterality   3. Prediabetes   4. Acquired hypothyroidism   5. Mixed hyperlipidemia   6. Obesity, Class I, BMI 30-34.9   7. Frequent PVCs   8. Adjustment disorder with anxiety   9. Notalgia paresthetica      Plan  Female Wellness Visit: Age appropriate Health Maintenance and Prevention measures were discussed with patient. Included topics are cancer screening recommendations, ways to keep healthy (see AVS) including dietary and exercise recommendations, regular eye and dental care, use of seat belts, and avoidance of moderate alcohol use and tobacco use.  BMI: discussed patient's BMI and encouraged positive lifestyle modifications to help get to or maintain a target BMI. HM needs and  immunizations were addressed and ordered. See below for orders. See HM and immunization section for updates. Flu shot today Routine labs and screening tests ordered including cmp, cbc and lipids where appropriate. Discussed recommendations regarding Vit D and calcium supplementation (see AVS)  Chronic disease management visit and/or acute problem visit: Chronic medical problems are well controlled. Reviewed labs and specialists notes. Refills per orders.  HLD: LDL is above goal; change to crestor 10 and recheck with healthy weight and wellness. Want LDL < 100 at minimum. Will recheck lfts as well Low thyroid: recheck tsh and TFTs today and adjust down dose of levothyroxine if indicated. Now on 150mg. May need adjustment with weight loss.   Follow up: 12 mo for cpe  Orders Placed This Encounter  Procedures   TSH   T3   T4, free    Meds ordered this encounter  Medications   glycopyrrolate (ROBINUL) 1 MG tablet    Sig: Take 1 tablet (1 mg total) by mouth 2 (two) times daily.    Dispense:  180 tablet    Refill:  3   fluticasone (FLONASE) 50 MCG/ACT nasal spray    Sig: Place 1 spray into both nostrils daily.    Dispense:  16 g    Refill:  11   Potassium Chloride ER 20 MEQ TBCR    Sig: Take 20 mEq by mouth daily.   hydrochlorothiazide (HYDRODIURIL) 25 MG tablet    Sig: Take 1 tablet (25 mg total) by mouth daily.    Dispense:  90 tablet    Refill:  0   rosuvastatin (CRESTOR)  10 MG tablet    Sig: Take 1 tablet (10 mg total) by mouth daily.    Dispense:  90 tablet    Refill:  3       Body mass index is 29.02 kg/m. Wt Readings from Last 3 Encounters:  07/17/21 174 lb 6.4 oz (79.1 kg)  06/14/21 169 lb (76.7 kg)  05/02/21 171 lb (77.6 kg)     Patient Active Problem List   Diagnosis Date Noted   Obesity, Class I, BMI 30-34.9 10/07/2019    Priority: 1.   Insomnia due to medical condition 10/18/2018    Priority: 1.   Prediabetes 10/12/2018    Priority: 1.   Mixed  hyperlipidemia 03/01/2018    Priority: 1.   Acquired hypothyroidism 03/01/2018    Priority: 1.   Frequent PVCs 03/01/2018    Priority: 1.   Adjustment disorder with anxiety 10/07/2019    Priority: 2.    2020 responsive to sertraline    GERD (gastroesophageal reflux disease) 03/30/2018    Priority: 2.   Meniere disease 03/01/2018    Priority: 2.   Notalgia paresthetica 07/01/2019    Priority: 3.   Diverticulosis 03/30/2018    Priority: 3.   History of rotator cuff syndrome with tear and adhesive capsulitis 09/11/2018    MRI R shoulder 08/22/18: IMPRESSION: 1. Calcific tendinosis and low-grade partial-thickness bursal surface tear of the distal anterior supraspinatus tendon at the footprint. 2. Moderate acromioclavicular osteoarthritis.  10/05/18 - PRP inj    History of lateral epicondylitis of right elbow 06/25/2018    MRI R elbow 08/22/18: IMPRESSION: 1. Partial tear of the common forearm extensor tendon origin.  09/25/18 - PRP inj     Health Maintenance  Topic Date Due   COVID-19 Vaccine (4 - Booster for Pfizer series) 09/29/2020   INFLUENZA VACCINE  05/07/2021   MAMMOGRAM  06/13/2022   DEXA SCAN  05/29/2023   COLONOSCOPY (Pts 45-77yr Insurance coverage will need to be confirmed)  08/11/2023   TETANUS/TDAP  06/19/2027   Hepatitis C Screening  Completed   Zoster Vaccines- Shingrix  Completed   HPV VACCINES  Aged Out   Immunization History  Administered Date(s) Administered   Fluad Quad(high Dose 65+) 06/08/2019, 06/21/2020   Hepatitis A 06/18/2004   Hepatitis B 06/18/2004   Influenza, High Dose Seasonal PF 06/16/2018   PFIZER(Purple Top)SARS-COV-2 Vaccination 11/14/2019, 12/09/2019, 07/07/2020   Pneumococcal Conjugate-13 09/14/2015   Pneumococcal Polysaccharide-23 06/10/2017   Tdap 06/18/2017   Zoster Recombinat (Shingrix) 06/18/2017, 11/06/2017   Zoster, Live 08/22/2010   We updated and reviewed the patient's past history in detail and it is documented  below. Allergies: Patient is allergic to seasonal ic [cholestatin] and adhesive [tape]. Past Medical History Patient  has a past medical history of Acquired hypothyroidism (03/01/2018), Anxiety, Atrial premature depolarization, BPPV (benign paroxysmal positional vertigo) (03/01/2018), Colon polyps, Essential hypertension (03/01/2018), Frequent PVCs (03/01/2018), Gallbladder problem, GERD (gastroesophageal reflux disease), Hip pain, History of cardiac catheterization, History of depression, History of dizziness, History of echocardiogram, History of stomach ulcers, HLD (hyperlipidemia), Hypothyroidism, IBS (irritable bowel syndrome), Infertility, female, Insulin resistance, Lower back pain, Meniere's disease, Notalgia paresthetica, Prediabetes, PVC (premature ventricular contraction), Vertigo, and Vitamin D deficiency. Past Surgical History Patient  has a past surgical history that includes S/P Eye (Right, 1967); Tonsillectomy (1975); Foot neuroma surgery (1986); Total abdominal hysterectomy (1996); Breast reduction surgery (1999); Cholecystectomy (2011); Thyroidectomy (2012); Breast biopsy (2013); Carpel Tunnel Release; Refractive surgery; Cardiac catheterization (05/2015); Reduction mammaplasty; Bladder surgery (1997); Colonoscopy (  08/20/2015); and Esophagogastroduodenoscopy (01/13/2017). Family History: Patient family history includes Breast cancer in her mother; Cancer in her mother; Depression in her brother; Diabetes in her daughter; Early death in her sister; Hearing loss in her brother; Heart attack in her father; Heart disease in her father and paternal grandmother; Hyperlipidemia in her father and mother; Peripheral Artery Disease in her father; Pulmonary fibrosis in her father; Thyroid disease in her mother. Social History:  Patient  reports that she has never smoked. She has never used smokeless tobacco. She reports current alcohol use. She reports that she does not use drugs.  Review of  Systems: Constitutional: negative for fever or malaise Ophthalmic: negative for photophobia, double vision or loss of vision Cardiovascular: negative for chest pain, dyspnea on exertion, or new LE swelling Respiratory: negative for SOB or persistent cough Gastrointestinal: negative for abdominal pain, change in bowel habits or melena Genitourinary: negative for dysuria or gross hematuria, no abnormal uterine bleeding or disharge Musculoskeletal: negative for new gait disturbance or muscular weakness Integumentary: negative for new or persistent rashes, no breast lumps Neurological: negative for TIA or stroke symptoms Psychiatric: negative for SI or delusions Allergic/Immunologic: negative for hives  Patient Care Team    Relationship Specialty Notifications Start End  Leamon Arnt, MD PCP - General Family Medicine  09/06/19   Deboraha Sprang, MD PCP - Electrophysiology Cardiology  03/07/20   Gerda Diss, DO Consulting Physician Sports Medicine  05/25/18   Debbra Riding, MD Consulting Physician Ophthalmology  06/09/18   Deboraha Sprang, MD Consulting Physician Cardiology  10/07/19   Magnus Sinning, MD Consulting Physician Physical Medicine and Rehabilitation  10/07/19   Edythe Clarity, [facility]  Pharmacist  06/05/21     Objective  Vitals: BP 106/64   Pulse 65   Temp 97.7 F (36.5 C) (Temporal)   Ht 5' 5" (1.651 m)   Wt 174 lb 6.4 oz (79.1 kg)   SpO2 96%   BMI 29.02 kg/m  General:  Well developed, well nourished, no acute distress  Psych:  Alert and orientedx3,normal mood and affect HEENT:  Normocephalic, atraumatic, non-icteric sclera,  supple neck without adenopathy, mass or thyromegaly Cardiovascular:  Normal S1, S2, RRR without gallop, rub or murmur Respiratory:  Good breath sounds bilaterally, CTAB with normal respiratory effort Gastrointestinal: normal bowel sounds, soft, non-tender, no noted masses. No HSM MSK: no deformities, contusions. Joints are without  erythema or swelling.  Skin:  Warm, no rashes or suspicious lesions noted Neurologic:    Mental status is normal. Gross motor and sensory exams are normal. Normal gait. No tremor Breast Exam: No mass, skin retraction or nipple discharge is appreciated in either breast. No axillary adenopathy. Fibrocystic changes are not noted. Well healed surgical scars present.     No visits with results within 1 Day(s) from this visit.  Latest known visit with results is:  Office Visit on 06/14/2021  Component Date Value Ref Range Status   WBC 06/14/2021 6.1  3.4 - 10.8 x10E3/uL Final   RBC 06/14/2021 4.76  3.77 - 5.28 x10E6/uL Final   Hemoglobin 06/14/2021 14.2  11.1 - 15.9 g/dL Final   Hematocrit 06/14/2021 42.3  34.0 - 46.6 % Final   MCV 06/14/2021 89  79 - 97 fL Final   MCH 06/14/2021 29.8  26.6 - 33.0 pg Final   MCHC 06/14/2021 33.6  31.5 - 35.7 g/dL Final   RDW 06/14/2021 13.2  11.7 - 15.4 % Final   Platelets 06/14/2021 243  150 - 450 x10E3/uL Final   Neutrophils 06/14/2021 55  Not Estab. % Final   Lymphs 06/14/2021 37  Not Estab. % Final   Monocytes 06/14/2021 5  Not Estab. % Final   Eos 06/14/2021 2  Not Estab. % Final   Basos 06/14/2021 1  Not Estab. % Final   Neutrophils Absolute 06/14/2021 3.4  1.4 - 7.0 x10E3/uL Final   Lymphocytes Absolute 06/14/2021 2.3  0.7 - 3.1 x10E3/uL Final   Monocytes Absolute 06/14/2021 0.3  0.1 - 0.9 x10E3/uL Final   EOS (ABSOLUTE) 06/14/2021 0.1  0.0 - 0.4 x10E3/uL Final   Basophils Absolute 06/14/2021 0.0  0.0 - 0.2 x10E3/uL Final   Immature Granulocytes 06/14/2021 0  Not Estab. % Final   Immature Grans (Abs) 06/14/2021 0.0  0.0 - 0.1 x10E3/uL Final   Glucose 06/14/2021 95  65 - 99 mg/dL Final   BUN 06/14/2021 16  8 - 27 mg/dL Final   Creatinine, Ser 06/14/2021 0.45 (A) 0.57 - 1.00 mg/dL Final   eGFR 06/14/2021 105  >59 mL/min/1.73 Final   BUN/Creatinine Ratio 06/14/2021 36 (A) 12 - 28 Final   Sodium 06/14/2021 140  134 - 144 mmol/L Final   Potassium  06/14/2021 3.6  3.5 - 5.2 mmol/L Final   Chloride 06/14/2021 100  96 - 106 mmol/L Final   CO2 06/14/2021 25  20 - 29 mmol/L Final   Calcium 06/14/2021 8.9  8.7 - 10.3 mg/dL Final   Total Protein 06/14/2021 7.3  6.0 - 8.5 g/dL Final   Albumin 06/14/2021 4.5  3.8 - 4.8 g/dL Final   Globulin, Total 06/14/2021 2.8  1.5 - 4.5 g/dL Final   Albumin/Globulin Ratio 06/14/2021 1.6  1.2 - 2.2 Final   Bilirubin Total 06/14/2021 0.3  0.0 - 1.2 mg/dL Final   Alkaline Phosphatase 06/14/2021 75  44 - 121 IU/L Final   AST 06/14/2021 27  0 - 40 IU/L Final   ALT 06/14/2021 36 (A) 0 - 32 IU/L Final   Hgb A1c MFr Bld 06/14/2021 5.9 (A) 4.8 - 5.6 % Final   Est. average glucose Bld gHb Est-m* 06/14/2021 123  mg/dL Final   INSULIN 06/14/2021 17.4  2.6 - 24.9 uIU/mL Final   Cholesterol, Total 06/14/2021 185  100 - 199 mg/dL Final   Triglycerides 06/14/2021 112  0 - 149 mg/dL Final   HDL 06/14/2021 63  >39 mg/dL Final   VLDL Cholesterol Cal 06/14/2021 20  5 - 40 mg/dL Final   LDL Chol Calc (NIH) 06/14/2021 102 (A) 0 - 99 mg/dL Final   Chol/HDL Ratio 06/14/2021 2.9  0.0 - 4.4 ratio Final   Vit D, 25-Hydroxy 06/14/2021 46.6  30.0 - 100.0 ng/mL Final   Vitamin B-12 06/14/2021 480  232 - 1,245 pg/mL Final   TSH 06/14/2021 0.182 (A) 0.450 - 4.500 uIU/mL Final   Free T4 06/14/2021 1.65  0.82 - 1.77 ng/dL Final     Commons side effects, risks, benefits, and alternatives for medications and treatment plan prescribed today were discussed, and the patient expressed understanding of the given instructions. Patient is instructed to call or message via MyChart if he/she has any questions or concerns regarding our treatment plan. No barriers to understanding were identified. We discussed Red Flag symptoms and signs in detail. Patient expressed understanding regarding what to do in case of urgent or emergency type symptoms.  Medication list was reconciled, printed and provided to the patient in AVS. Patient instructions and  summary information was reviewed with the  patient as documented in the AVS. This note was prepared with assistance of Dragon voice recognition software. Occasional wrong-word or sound-a-like substitutions may have occurred due to the inherent limitations of voice recognition software  This visit occurred during the SARS-CoV-2 public health emergency.  Safety protocols were in place, including screening questions prior to the visit, additional usage of staff PPE, and extensive cleaning of exam room while observing appropriate contact time as indicated for disinfecting solutions.

## 2021-07-17 NOTE — Patient Instructions (Signed)
Please return in 12 months for your annual complete physical; please come fasting.   Please recheck your cholesterol and liver tests in 6-12 weeks with Dr. Juleen China. You may switch to the crestor and stop the pravastatin.   I will release your lab results to you on your MyChart account with further instructions. Please reply with any questions.    Today you were given your flu vaccination.    If you have any questions or concerns, please don't hesitate to send me a message via MyChart or call the office at [phone]. Thank you for visiting with [place] today! It's our pleasure caring for you.

## 2021-07-18 ENCOUNTER — Encounter: Payer: Self-pay | Admitting: Family Medicine

## 2021-07-18 ENCOUNTER — Ambulatory Visit (INDEPENDENT_AMBULATORY_CARE_PROVIDER_SITE_OTHER): Payer: PPO | Admitting: Family Medicine

## 2021-07-18 ENCOUNTER — Encounter (INDEPENDENT_AMBULATORY_CARE_PROVIDER_SITE_OTHER): Payer: Self-pay | Admitting: Family Medicine

## 2021-07-18 ENCOUNTER — Other Ambulatory Visit: Payer: Self-pay

## 2021-07-18 VITALS — BP 99/62 | HR 71 | Temp 98.1°F | Ht 65.0 in | Wt 169.0 lb

## 2021-07-18 DIAGNOSIS — E669 Obesity, unspecified: Secondary | ICD-10-CM | POA: Diagnosis not present

## 2021-07-18 DIAGNOSIS — Z6831 Body mass index (BMI) 31.0-31.9, adult: Secondary | ICD-10-CM

## 2021-07-18 DIAGNOSIS — R7303 Prediabetes: Secondary | ICD-10-CM

## 2021-07-18 DIAGNOSIS — E039 Hypothyroidism, unspecified: Secondary | ICD-10-CM | POA: Diagnosis not present

## 2021-07-18 DIAGNOSIS — F4322 Adjustment disorder with anxiety: Secondary | ICD-10-CM

## 2021-07-18 LAB — T3: T3, Total: 95 ng/dL (ref 76–181)

## 2021-07-18 MED ORDER — LEVOTHYROXINE SODIUM 100 MCG PO TABS: 100.0000 ug | ORAL_TABLET | Freq: Every day | ORAL | 3 refills | Status: DC

## 2021-07-18 NOTE — Addendum Note (Signed)
Addended by: Billey Chang on: 07/18/2021 04:26 PM   Modules accepted: Orders

## 2021-07-24 NOTE — Progress Notes (Signed)
Chief Complaint:   OBESITY Mary Hernandez is here to discuss her progress with her obesity treatment plan along with follow-up of her obesity related diagnoses. See Medical Weight Management Flowsheet for complete bioelectrical impedance results.  Today's visit was #: 35 Starting weight: 192 lbs Starting date: 07/03/2020 Weight change since last visit: 0 Total lbs lost to date: 23 lbs Total weight loss percentage to date: -11.98%  Nutrition Plan: Category 2 Meal Plan for 50% of the time. Activity: Increased activity. Anti-obesity medications: Ozempic 2 mg subcutaneously weekly. Reported side effects: None.  Interim History: Mary Hernandez says she is happy to have maintained weight while with her daughter in [place].  She says she has less hip and foot pain.  She recently had thyroid testing with her PCP.  Current Outpatient Medications:    acebutolol (SECTRAL) 200 MG capsule, Take 1 capsule (200 mg total) by mouth daily., Disp: 90 capsule, Rfl: 2   Calcium Citrate-Vitamin D 315-250 MG-UNIT TABS, Take by mouth., Disp: , Rfl:    cetirizine (ZYRTEC) 10 MG tablet, Take 10 mg by mouth daily., Disp: , Rfl:    diclofenac Sodium (VOLTAREN) 1 % GEL, Apply 2 g topically 4 (four) times daily., Disp: 350 g, Rfl: 5   EVENING PRIMROSE OIL PO, Take 1,500 mg by mouth 2 (two) times daily., Disp: , Rfl:    flecainide (TAMBOCOR) 50 MG tablet, Take 1.5 tablets (75 mg total) by mouth 2 (two) times daily., Disp: 270 tablet, Rfl: 1   fluticasone (FLONASE) 50 MCG/ACT nasal spray, Place 1 spray into both nostrils daily., Disp: 16 g, Rfl: 11   Glucosamine-Chondroit-Vit C-Mn (GLUCOSAMINE 1500 COMPLEX) CAPS, Take by mouth., Disp: , Rfl:    glycopyrrolate (ROBINUL) 1 MG tablet, Take 1 tablet (1 mg total) by mouth 2 (two) times daily., Disp: 180 tablet, Rfl: 3   hydrochlorothiazide (HYDRODIURIL) 25 MG tablet, Take 1 tablet (25 mg total) by mouth daily., Disp: 90 tablet, Rfl: 0   naltrexone (DEPADE) 50 MG tablet, Take 50 mg by  mouth daily. 1/2 tablet am for 1 week, then 1/2 am and 1/2 pm after 1 week, Disp: , Rfl:    omega-3 acid ethyl esters (LOVAZA) 1 g capsule, Take 1 g by mouth 2 (two) times daily., Disp: , Rfl:    ondansetron (ZOFRAN-ODT) 4 MG disintegrating tablet, Take 4 mg by mouth every 8 (eight) hours as needed for nausea or vomiting., Disp: , Rfl:    Potassium Chloride ER 20 MEQ TBCR, Take 20 mEq by mouth daily., Disp: , Rfl:    pregabalin (LYRICA) 75 MG capsule, Take 75 mg by mouth 2 (two) times daily., Disp: , Rfl:    Probiotic Product (PROBIOTIC-10 PO), Take by mouth., Disp: , Rfl:    rosuvastatin (CRESTOR) 10 MG tablet, Take 1 tablet (10 mg total) by mouth daily., Disp: 90 tablet, Rfl: 3   Semaglutide, 2 MG/DOSE, (OZEMPIC, 2 MG/DOSE,) 8 MG/3ML SOPN, Inject 2 mg into the skin once a week., Disp: 3 mL, Rfl: 0   sertraline (ZOLOFT) 50 MG tablet, TAKE ONE AND ONE-HALF TABLETS BY MOUTH DAILY, Disp: 135 tablet, Rfl: 0   TART CHERRY PO, Take by mouth., Disp: , Rfl:    Turmeric 400 MG CAPS, Take by mouth., Disp: , Rfl:    UNABLE TO FIND, Inject 2 mg into the skin once a week. Med Name: Ozempic 2 mg dose (8 mg/3 mL subcutaneous pen injector), Disp: 9 mL, Rfl: 0   levothyroxine (SYNTHROID) 100 MCG tablet, Take  1 tablet (100 mcg total) by mouth daily., Disp: 90 tablet, Rfl: 3  Assessment/Plan:   1. Acquired hypothyroidism Medication: levothyroxine 100 mcg daily.   Plan: Patient was instructed not to take MVM or iron within 4 hours of taking thyroid medications. This issue is managed by Dr. Jonni Sanger. We will continue to monitor alongside.  Lab Results  Component Value Date   TSH 0.29 (L) 07/17/2021   2. Prediabetes Not at goal. Goal is HgbA1c < 5.7.  Medication: Ozempic 2 mg subcutaneously weekly.    Plan: She will continue to focus on protein-rich, low simple carbohydrate foods. We reviewed the importance of hydration, regular exercise for stress reduction, and restorative sleep.   Lab Results  Component  Value Date   HGBA1C 5.9 (H) 06/14/2021   Lab Results  Component Value Date   INSULIN 17.4 06/14/2021   INSULIN 27.5 (H) 10/12/2020   INSULIN 21.9 07/03/2020    3. Adjustment disorder with anxiety Mary Hernandez is taking Zoloft 75 mg daily.   Plan:  The current medical regimen is effective;  continue present plan and medications. Behavior modification techniques were discussed today to help Mary Hernandez deal with her anxiety.  4. Obesity BMI today 28.2  Course: Mary Hernandez is currently in the action stage of change. As such, her goal is to continue with weight loss efforts.   Nutrition goals: She has agreed to the Category 2 Plan.   Exercise goals:  As is.  Behavioral modification strategies: increasing lean protein intake, decreasing simple carbohydrates, increasing vegetables, and increasing water intake.  Mary Hernandez has agreed to follow-up with our clinic in 3 weeks. She was informed of the importance of frequent follow-up visits to maximize her success with intensive lifestyle modifications for her multiple health conditions.   Objective:   Blood pressure 99/62, pulse 71, temperature 98.1 F (36.7 C), temperature source Oral, height 5\' 5"  (1.651 m), weight 169 lb (76.7 kg), SpO2 95 %. Body mass index is 28.12 kg/m.  General: Cooperative, alert, well developed, in no acute distress. HEENT: Conjunctivae and lids unremarkable. Cardiovascular: Regular rhythm.  Lungs: Normal work of breathing. Neurologic: No focal deficits.   Lab Results  Component Value Date   CREATININE 0.45 (L) 06/14/2021   BUN 16 06/14/2021   NA 140 06/14/2021   K 3.6 06/14/2021   CL 100 06/14/2021   CO2 25 06/14/2021   Lab Results  Component Value Date   ALT 36 (H) 06/14/2021   AST 27 06/14/2021   ALKPHOS 75 06/14/2021   BILITOT 0.3 06/14/2021   Lab Results  Component Value Date   HGBA1C 5.9 (H) 06/14/2021   HGBA1C 6.0 (H) 01/29/2021   HGBA1C 5.9 (H) 10/12/2020   HGBA1C 6.2 (H) 06/21/2020   HGBA1C 5.9 (A)  10/07/2019   Lab Results  Component Value Date   INSULIN 17.4 06/14/2021   INSULIN 27.5 (H) 10/12/2020   INSULIN 21.9 07/03/2020   Lab Results  Component Value Date   TSH 0.29 (L) 07/17/2021   Lab Results  Component Value Date   CHOL 185 06/14/2021   HDL 63 06/14/2021   LDLCALC 102 (H) 06/14/2021   LDLDIRECT 94.0 05/01/2018   TRIG 112 06/14/2021   CHOLHDL 2.9 06/14/2021   Lab Results  Component Value Date   VD25OH 46.6 06/14/2021   VD25OH 52.3 07/03/2020   Lab Results  Component Value Date   WBC 6.1 06/14/2021   HGB 14.2 06/14/2021   HCT 42.3 06/14/2021   MCV 89 06/14/2021   PLT 243 06/14/2021  Attestation Statements:   Reviewed by clinician on day of visit: allergies, medications, problem list, medical history, surgical history, family history, social history, and previous encounter notes.  I, Water quality scientist, CMA, am acting as transcriptionist for [name], DO  I have reviewed the above documentation for accuracy and completeness, and I agree with the above. -  [name], DO, MS, FAAFP, DABOM - Family and Bariatric Medicine.

## 2021-07-30 ENCOUNTER — Ambulatory Visit: Payer: PPO

## 2021-08-02 ENCOUNTER — Encounter: Payer: Self-pay | Admitting: Family Medicine

## 2021-08-06 ENCOUNTER — Encounter: Payer: Self-pay | Admitting: Family Medicine

## 2021-08-06 DIAGNOSIS — M25551 Pain in right hip: Secondary | ICD-10-CM | POA: Diagnosis not present

## 2021-08-06 DIAGNOSIS — M9908 Segmental and somatic dysfunction of rib cage: Secondary | ICD-10-CM | POA: Diagnosis not present

## 2021-08-06 DIAGNOSIS — M65341 Trigger finger, right ring finger: Secondary | ICD-10-CM | POA: Diagnosis not present

## 2021-08-06 DIAGNOSIS — M9901 Segmental and somatic dysfunction of cervical region: Secondary | ICD-10-CM | POA: Diagnosis not present

## 2021-08-06 DIAGNOSIS — M79672 Pain in left foot: Secondary | ICD-10-CM | POA: Diagnosis not present

## 2021-08-06 DIAGNOSIS — M9904 Segmental and somatic dysfunction of sacral region: Secondary | ICD-10-CM | POA: Diagnosis not present

## 2021-08-06 DIAGNOSIS — M9907 Segmental and somatic dysfunction of upper extremity: Secondary | ICD-10-CM | POA: Diagnosis not present

## 2021-08-06 DIAGNOSIS — M9906 Segmental and somatic dysfunction of lower extremity: Secondary | ICD-10-CM | POA: Diagnosis not present

## 2021-08-06 DIAGNOSIS — M9905 Segmental and somatic dysfunction of pelvic region: Secondary | ICD-10-CM | POA: Diagnosis not present

## 2021-08-06 DIAGNOSIS — M25541 Pain in joints of right hand: Secondary | ICD-10-CM | POA: Diagnosis not present

## 2021-08-06 DIAGNOSIS — M542 Cervicalgia: Secondary | ICD-10-CM | POA: Diagnosis not present

## 2021-08-07 DIAGNOSIS — H16223 Keratoconjunctivitis sicca, not specified as Sjogren's, bilateral: Secondary | ICD-10-CM | POA: Diagnosis not present

## 2021-08-07 DIAGNOSIS — H35371 Puckering of macula, right eye: Secondary | ICD-10-CM | POA: Diagnosis not present

## 2021-08-07 DIAGNOSIS — H0288A Meibomian gland dysfunction right eye, upper and lower eyelids: Secondary | ICD-10-CM | POA: Diagnosis not present

## 2021-08-07 DIAGNOSIS — H0288B Meibomian gland dysfunction left eye, upper and lower eyelids: Secondary | ICD-10-CM | POA: Diagnosis not present

## 2021-08-07 DIAGNOSIS — Z9889 Other specified postprocedural states: Secondary | ICD-10-CM | POA: Diagnosis not present

## 2021-08-07 DIAGNOSIS — H2513 Age-related nuclear cataract, bilateral: Secondary | ICD-10-CM | POA: Diagnosis not present

## 2021-08-08 ENCOUNTER — Encounter: Payer: Self-pay | Admitting: Family Medicine

## 2021-08-15 ENCOUNTER — Encounter (INDEPENDENT_AMBULATORY_CARE_PROVIDER_SITE_OTHER): Payer: Self-pay | Admitting: Family Medicine

## 2021-08-15 ENCOUNTER — Ambulatory Visit (INDEPENDENT_AMBULATORY_CARE_PROVIDER_SITE_OTHER): Payer: PPO | Admitting: Family Medicine

## 2021-08-15 ENCOUNTER — Other Ambulatory Visit: Payer: Self-pay

## 2021-08-15 VITALS — BP 95/57 | HR 65 | Temp 97.7°F | Ht 65.0 in | Wt 170.0 lb

## 2021-08-15 DIAGNOSIS — E038 Other specified hypothyroidism: Secondary | ICD-10-CM

## 2021-08-15 DIAGNOSIS — Z6831 Body mass index (BMI) 31.0-31.9, adult: Secondary | ICD-10-CM | POA: Diagnosis not present

## 2021-08-15 DIAGNOSIS — E669 Obesity, unspecified: Secondary | ICD-10-CM

## 2021-08-15 DIAGNOSIS — G8929 Other chronic pain: Secondary | ICD-10-CM

## 2021-08-15 DIAGNOSIS — R7303 Prediabetes: Secondary | ICD-10-CM | POA: Diagnosis not present

## 2021-08-16 ENCOUNTER — Telehealth: Payer: Self-pay | Admitting: Pharmacist

## 2021-08-16 NOTE — Progress Notes (Signed)
Chief Complaint:   OBESITY Mary Hernandez is here to discuss her progress with her obesity treatment plan along with follow-up of her obesity related diagnoses. See Medical Weight Management Flowsheet for complete bioelectrical impedance results.  Today's visit was #: 85 Starting weight: 192 lbs Starting date: 07/03/2020 Weight change since last visit: +1 lb Total lbs lost to date: 22 lbs Total weight loss percentage to date: -11.46%  Nutrition Plan: Category 2 Meal Plan for 60% of the time. Activity: Pilates/walking/water aerobics for 60 minutes 2-5 times per week.  Anti-obesity medications: Ozempic 2 mg subcutaneously weekly. Reported side effects: None.  Interim History: Mary Hernandez says she is still feeling stronger and more mobile.  She wishes her weight loss would happen faster.  Assessment/Plan:   1. Other specified hypothyroidism Medication: levothyroxine 100 mcg daily (recently decreased).     Plan: Patient was instructed not to take MVM or iron within 4 hours of taking thyroid medications. This issue is managed by Dr. Jonni Sanger. We will continue to monitor alongside PCP as it relates to her weight loss journey.   Lab Results  Component Value Date   TSH 0.29 (L) 07/17/2021   2. Other chronic pain Managed well.  This issue directly impacts care plan for optimization of BMI and metabolic health as it impacts the patient's ability to make lifestyle changes. We will continue to monitor as it relates to her weight loss journey.  3. Prediabetes Controlled. Goal is HgbA1c < 5.7.  Medication: Ozempic 2 mg subcutaneously weekly.    Plan:  Continue Ozempic 2 mg subcutaneously weekly.  She will continue to focus on protein-rich, low simple carbohydrate foods. We reviewed the importance of hydration, regular exercise for stress reduction, and restorative sleep.   Lab Results  Component Value Date   HGBA1C 5.9 (H) 06/14/2021   Lab Results  Component Value Date   INSULIN 17.4 06/14/2021    INSULIN 27.5 (H) 10/12/2020   INSULIN 21.9 07/03/2020   4. Obesity BMI today is 28.4  Course: Mary Hernandez is currently in the action stage of change. As such, her goal is to continue with weight loss efforts.   Nutrition goals: She has agreed to the Category 2 Plan.   Exercise goals:  As is.  Behavioral modification strategies: increasing lean protein intake, decreasing simple carbohydrates, increasing vegetables, and increasing water intake.  Mary Hernandez has agreed to follow-up with our clinic in 4 weeks. She was informed of the importance of frequent follow-up visits to maximize her success with intensive lifestyle modifications for her multiple health conditions.   Objective:   Blood pressure (!) 95/57, pulse 65, temperature 97.7 F (36.5 C), temperature source Oral, height 5\' 5"  (1.651 m), weight 170 lb (77.1 kg), SpO2 97 %. Body mass index is 28.29 kg/m.  General: Cooperative, alert, well developed, in no acute distress. HEENT: Conjunctivae and lids unremarkable. Cardiovascular: Regular rhythm.  Lungs: Normal work of breathing. Neurologic: No focal deficits.   Lab Results  Component Value Date   CREATININE 0.45 (L) 06/14/2021   BUN 16 06/14/2021   NA 140 06/14/2021   K 3.6 06/14/2021   CL 100 06/14/2021   CO2 25 06/14/2021   Lab Results  Component Value Date   ALT 36 (H) 06/14/2021   AST 27 06/14/2021   ALKPHOS 75 06/14/2021   BILITOT 0.3 06/14/2021   Lab Results  Component Value Date   HGBA1C 5.9 (H) 06/14/2021   HGBA1C 6.0 (H) 01/29/2021   HGBA1C 5.9 (H) 10/12/2020  HGBA1C 6.2 (H) 06/21/2020   HGBA1C 5.9 (A) 10/07/2019   Lab Results  Component Value Date   INSULIN 17.4 06/14/2021   INSULIN 27.5 (H) 10/12/2020   INSULIN 21.9 07/03/2020   Lab Results  Component Value Date   TSH 0.29 (L) 07/17/2021   Lab Results  Component Value Date   CHOL 185 06/14/2021   HDL 63 06/14/2021   LDLCALC 102 (H) 06/14/2021   LDLDIRECT 94.0 05/01/2018   TRIG 112 06/14/2021    CHOLHDL 2.9 06/14/2021   Lab Results  Component Value Date   VD25OH 46.6 06/14/2021   VD25OH 52.3 07/03/2020   Lab Results  Component Value Date   WBC 6.1 06/14/2021   HGB 14.2 06/14/2021   HCT 42.3 06/14/2021   MCV 89 06/14/2021   PLT 243 06/14/2021   Attestation Statements:   Reviewed by clinician on day of visit: allergies, medications, problem list, medical history, surgical history, family history, social history, and previous encounter notes.  I, Water quality scientist, CMA, am acting as transcriptionist for [name], DO  I have reviewed the above documentation for accuracy and completeness, and I agree with the above. -  [name], DO, MS, FAAFP, DABOM - Family and Bariatric Medicine.

## 2021-08-16 NOTE — Progress Notes (Addendum)
Chronic Care Management Pharmacy Assistant   Name: Mary Hernandez  MRN: [id number] DOB: 1951/01/03   Reason for Encounter: Monthly Medication Coordination Call     Medications: Outpatient Encounter Medications as of 08/16/2021  Medication Sig   acebutolol (SECTRAL) 200 MG capsule Take 1 capsule (200 mg total) by mouth daily.   Calcium Citrate-Vitamin D 315-250 MG-UNIT TABS Take by mouth.   cetirizine (ZYRTEC) 10 MG tablet Take 10 mg by mouth daily.   diclofenac Sodium (VOLTAREN) 1 % GEL Apply 2 g topically 4 (four) times daily.   EVENING PRIMROSE OIL PO Take 1,500 mg by mouth 2 (two) times daily.   flecainide (TAMBOCOR) 50 MG tablet Take 1.5 tablets (75 mg total) by mouth 2 (two) times daily.   fluticasone (FLONASE) 50 MCG/ACT nasal spray Place 1 spray into both nostrils daily.   Glucosamine-Chondroit-Vit C-Mn (GLUCOSAMINE 1500 COMPLEX) CAPS Take by mouth.   glycopyrrolate (ROBINUL) 1 MG tablet Take 1 tablet (1 mg total) by mouth 2 (two) times daily.   hydrochlorothiazide (HYDRODIURIL) 25 MG tablet Take 1 tablet (25 mg total) by mouth daily.   levothyroxine (SYNTHROID) 100 MCG tablet Take 1 tablet (100 mcg total) by mouth daily.   naltrexone (DEPADE) 50 MG tablet Take 50 mg by mouth daily. 1/2 tablet am for 1 week, then 1/2 am and 1/2 pm after 1 week   omega-3 acid ethyl esters (LOVAZA) 1 g capsule Take 1 g by mouth 2 (two) times daily.   ondansetron (ZOFRAN-ODT) 4 MG disintegrating tablet Take 4 mg by mouth every 8 (eight) hours as needed for nausea or vomiting.   Potassium Chloride ER 20 MEQ TBCR Take 20 mEq by mouth daily.   pregabalin (LYRICA) 75 MG capsule Take 75 mg by mouth 2 (two) times daily.   Probiotic Product (PROBIOTIC-10 PO) Take by mouth.   rosuvastatin (CRESTOR) 10 MG tablet Take 1 tablet (10 mg total) by mouth daily.   Semaglutide, 2 MG/DOSE, (OZEMPIC, 2 MG/DOSE,) 8 MG/3ML SOPN Inject 2 mg into the skin once a week.   sertraline (ZOLOFT) 50 MG tablet TAKE ONE  AND ONE-HALF TABLETS BY MOUTH DAILY   TART CHERRY PO Take by mouth.   Turmeric 400 MG CAPS Take by mouth.   UNABLE TO FIND Inject 2 mg into the skin once a week. Med Name: Ozempic 2 mg dose (8 mg/3 mL subcutaneous pen injector)   No facility-administered encounter medications on file as of 08/16/2021.    Reviewed chart for medication changes ahead of medication coordination call.  No OVs, Consults, or hospital visits since last care coordination call/Pharmacist visit. (If appropriate, list visit date, provider name)  No medication changes indicated OR if recent visit, treatment plan here.  BP Readings from Last 3 Encounters:  08/15/21 (!) 95/57  07/18/21 99/62  07/17/21 106/64    Lab Results  Component Value Date   HGBA1C 5.9 (H) 06/14/2021     Patient obtains medications through Adherence Packaging  90 Days   Last adherence delivery included: (medication name and frequency)  Fish Oil 1200 mg Take two capsules every morning and take one capsule every evening Evening Primrose 1000 Take one capsule every morning Calcium Citrate 315 Take one tab every morning and take one tab every evening Glucosamine Sulf Dipotassium Cl 750 mg- Chondroitin Sulf 600 Mg Tab - Take one tab every morning and take one tab every evening Claritin 10 mg Tablet Take one tab every evening Tart Cherry 1200 mg Tab Take one  tab every morning Acidophilus Probiotic Blend 175 mg Capsule Take one capsule every morning   Patient is due for next adherence delivery on: 08/29/21. Called patient and reviewed medications and coordinated delivery.  This delivery to include:  Fish Oil 1200 mg Take two capsules every morning and take one capsule every evening Evening Primrose 1000 Take one capsule every morning Calcium Citrate 315 Take one tab every morning and take one tab every evening Glucosamine Sulf Dipotassium Cl 750 mg- Chondroitin Sulf 600 Mg Tab - Take one tab every morning and take one tab every  evening Claritin 10 mg Tablet Take one tab every evening Tart Cherry 1200 mg Tab Take one tab every morning     Patient declined the following medications (meds) due to (reason)  Acidophilus Probiotic Blend 175 mg Capsule Take one capsule every morning (does not need is not taking currently)  Patient needs refills for None.  Confirmed delivery date of 08/29/21, advised patient that pharmacy will contact them the morning of delivery.   Care Gaps  AWV: done 07/17/21 Colonoscopy: done 08/10/18 DM Eye Exam: N/A DM Foot Exam: N/A Microalbumin: unknown  HbgAIC: done 06/14/21 (5.9) DEXA: done 05/28/18 Mammogram: done 06/14/21  Star Rating Drugs: rosuvastatin (CRESTOR) 10 MG tablet - last filled 08/07/21 (quantity not listed)  Future Appointments  Date Time Provider [place]  09/05/2021  8:00 AM [name], DO [facility] None  09/20/2021  9:40 AM [name], DO [facility] None  10/18/2021  3:15 PM Deboraha Sprang, MD [facility]  11/19/2021  1:00 PM [facility] HEALTH COACH [facility]  12/10/2021  4:30 PM [facility] CCM PHARMACIST [facility] [place], CCMA Clinical Pharmacist Assistant  [phone]   5 minutes spent in review, coordination, and documentation.  Reviewed by: Beverly Milch, PharmD Clinical Pharmacist [phone]

## 2021-08-21 DIAGNOSIS — M9907 Segmental and somatic dysfunction of upper extremity: Secondary | ICD-10-CM | POA: Diagnosis not present

## 2021-08-21 DIAGNOSIS — M79672 Pain in left foot: Secondary | ICD-10-CM | POA: Diagnosis not present

## 2021-08-21 DIAGNOSIS — M65341 Trigger finger, right ring finger: Secondary | ICD-10-CM | POA: Diagnosis not present

## 2021-08-21 DIAGNOSIS — M542 Cervicalgia: Secondary | ICD-10-CM | POA: Diagnosis not present

## 2021-08-21 DIAGNOSIS — M9906 Segmental and somatic dysfunction of lower extremity: Secondary | ICD-10-CM | POA: Diagnosis not present

## 2021-08-21 DIAGNOSIS — M25541 Pain in joints of right hand: Secondary | ICD-10-CM | POA: Diagnosis not present

## 2021-08-21 DIAGNOSIS — M65331 Trigger finger, right middle finger: Secondary | ICD-10-CM | POA: Diagnosis not present

## 2021-08-21 DIAGNOSIS — M9901 Segmental and somatic dysfunction of cervical region: Secondary | ICD-10-CM | POA: Diagnosis not present

## 2021-08-22 ENCOUNTER — Other Ambulatory Visit: Payer: Self-pay | Admitting: Student

## 2021-08-22 ENCOUNTER — Encounter (INDEPENDENT_AMBULATORY_CARE_PROVIDER_SITE_OTHER): Payer: Self-pay | Admitting: Family Medicine

## 2021-08-22 DIAGNOSIS — H8109 Meniere's disease, unspecified ear: Secondary | ICD-10-CM

## 2021-08-22 DIAGNOSIS — F4323 Adjustment disorder with mixed anxiety and depressed mood: Secondary | ICD-10-CM

## 2021-08-22 MED ORDER — SERTRALINE HCL 50 MG PO TABS
75.0000 mg | ORAL_TABLET | Freq: Every day | ORAL | 0 refills | Status: DC
Start: 2021-08-22 — End: 2021-10-23

## 2021-09-03 ENCOUNTER — Encounter: Payer: Self-pay | Admitting: Family

## 2021-09-03 ENCOUNTER — Other Ambulatory Visit: Payer: Self-pay

## 2021-09-03 ENCOUNTER — Encounter: Payer: Self-pay | Admitting: Family Medicine

## 2021-09-03 ENCOUNTER — Ambulatory Visit (INDEPENDENT_AMBULATORY_CARE_PROVIDER_SITE_OTHER): Payer: PPO | Admitting: Family

## 2021-09-03 VITALS — BP 108/70 | HR 58 | Temp 97.7°F | Ht 65.0 in | Wt 178.0 lb

## 2021-09-03 DIAGNOSIS — J101 Influenza due to other identified influenza virus with other respiratory manifestations: Secondary | ICD-10-CM | POA: Diagnosis not present

## 2021-09-03 LAB — POCT INFLUENZA A/B
Influenza A, POC: POSITIVE — AB
Influenza B, POC: NEGATIVE

## 2021-09-03 LAB — POC COVID19 BINAXNOW: SARS Coronavirus 2 Ag: NEGATIVE

## 2021-09-03 MED ORDER — OSELTAMIVIR PHOSPHATE 75 MG PO CAPS: 75.0000 mg | ORAL_CAPSULE | Freq: Two times a day (BID) | ORAL | 0 refills | Status: DC

## 2021-09-03 NOTE — Patient Instructions (Signed)
It was very nice to see you today!  As discussed, I have sent Tamiflu to your pharmacy, get at least 1 dose in today.  As discussed, I have sent generic Flonase nasal spray to your pharmacy, start this today. OK to continue generic Flonase, Claritin or Zyrtec daily and Tylenol as needed for pain or fever.  Drink plenty of fluids! Call back to office at the end of the week,  if symptoms are worsening or not improving.  PLEASE NOTE:  If you had any lab tests please let us know if you have not heard back within a few days. You may see your results on MyChart before we have a chance to review them but we will give you a call once they are reviewed by [place]. If we ordered any referrals today, please let us know if you have not heard from their office within the next week.   Please try these tips to maintain a healthy lifestyle:  Eat most of your calories during the day when you are active. Eliminate processed foods including packaged sweets (pies, cakes, cookies), reduce intake of potatoes, white bread, white pasta, and white rice. Look for whole grain options, oat flour or almond flour.  Each meal should contain half fruits/vegetables, one quarter protein, and one quarter carbs (no bigger than a computer mouse).  Cut down on sweet beverages. This includes juice, soda, and sweet tea. Also watch fruit intake, though this is a healthier sweet option, it still contains natural sugar! Limit to 3 servings daily.  Drink at least 1 glass of water with each meal and aim for at least 8 glasses per day  Exercise at least 150 minutes every week.

## 2021-09-03 NOTE — Progress Notes (Signed)
Subjective:     Patient ID: Mary Hernandez, female    DOB: 1950/10/12, 70 y.o.   MRN: [id number]  Chief Complaint  Patient presents with   Sore Throat    Her symptoms started Friday. She has taken OTC Tylenol, and flonase. She has also used a netty pot and lozenges.    Nasal Congestion    She denies fever.    HPI Upper Respiratory Infection: Symptoms include congestion, nasal congestion, non productive cough, post nasal drip, and sore throat.  Onset of symptoms was 3 days ago, gradually worsening since that time. She is drinking moderate amounts of fluids. Evaluation to date: none.  Treatment to date: cough suppressants and decongestants.   There are no preventive care reminders to display for this patient.  Past Medical History:  Diagnosis Date   Acquired hypothyroidism 03/01/2018   hx of goiter; s/p thyroidectomy   Anxiety    Atrial premature depolarization    BPPV (benign paroxysmal positional vertigo) 03/01/2018   Colon polyps    Essential hypertension 03/01/2018   denies hx - on diuretic for Meniere's and beta blocker for PVCs   Frequent PVCs 03/01/2018   Controlled on Flecainide, Acebutolol   Gallbladder problem    GERD (gastroesophageal reflux disease)    Hip pain    History of cardiac catheterization    Nuc 7/16:  normal perfusion; EF 76 // LHC 8/16:  normal coronary arteries   History of depression    History of dizziness    History of echocardiogram    Echo 03/2006:  Normal LVEF   History of stomach ulcers    HLD (hyperlipidemia)    Hypothyroidism    IBS (irritable bowel syndrome)    Infertility, female    Insulin resistance    Lower back pain    Meniere's disease    Notalgia paresthetica    Prediabetes    PVC (premature ventricular contraction)    Vertigo    Vitamin D deficiency     Past Surgical History:  Procedure Laterality Date   BLADDER SURGERY  1997   BREAST BIOPSY  2013   BREAST REDUCTION SURGERY  1999   CARDIAC CATHETERIZATION   05/2015   Carpel Tunnel Release     CHOLECYSTECTOMY  2011   COLONOSCOPY  08/20/2015   ESOPHAGOGASTRODUODENOSCOPY  01/13/2017   FOOT NEUROMA SURGERY  1986   REDUCTION MAMMAPLASTY     REFRACTIVE SURGERY     S/P Eye Right 1967   Muscle Clipped    THYROIDECTOMY  2012   [place]    Outpatient Medications Prior to Visit  Medication Sig Dispense Refill   acebutolol (SECTRAL) 200 MG capsule Take 1 capsule (200 mg total) by mouth daily. 90 capsule 2   Calcium Citrate-Vitamin D 315-250 MG-UNIT TABS Take by mouth.     cetirizine (ZYRTEC) 10 MG tablet Take 10 mg by mouth daily.     diclofenac Sodium (VOLTAREN) 1 % GEL Apply 2 g topically 4 (four) times daily. 350 g 5   EVENING PRIMROSE OIL PO Take 1,500 mg by mouth 2 (two) times daily.     flecainide (TAMBOCOR) 50 MG tablet Take 1.5 tablets (75 mg total) by mouth 2 (two) times daily. 270 tablet 1   fluticasone (FLONASE) 50 MCG/ACT nasal spray Place 1 spray into both nostrils daily. 16 g 11   Glucosamine-Chondroit-Vit C-Mn (GLUCOSAMINE 1500 COMPLEX) CAPS Take by mouth.     glycopyrrolate (ROBINUL) 1  MG tablet Take 1 tablet (1 mg total) by mouth 2 (two) times daily. 180 tablet 3   hydrochlorothiazide (HYDRODIURIL) 25 MG tablet Take 1 tablet (25 mg total) by mouth daily. 90 tablet 0   levothyroxine (SYNTHROID) 100 MCG tablet Take 1 tablet (100 mcg total) by mouth daily. 90 tablet 3   naltrexone (DEPADE) 50 MG tablet Take 50 mg by mouth daily. 1/2 tablet am for 1 week, then 1/2 am and 1/2 pm after 1 week     omega-3 acid ethyl esters (LOVAZA) 1 g capsule Take 1 g by mouth 2 (two) times daily.     ondansetron (ZOFRAN-ODT) 4 MG disintegrating tablet Take 4 mg by mouth every 8 (eight) hours as needed for nausea or vomiting.     Potassium Chloride ER 20 MEQ TBCR Take 20 mEq by mouth daily. Please keep upcoming appt in January 2023 with Dr. Caryl Comes before anymore refills. Thank you 90 tablet 0   pregabalin  (LYRICA) 75 MG capsule Take 75 mg by mouth 2 (two) times daily.     Probiotic Product (PROBIOTIC-10 PO) Take by mouth.     rosuvastatin (CRESTOR) 10 MG tablet Take 1 tablet (10 mg total) by mouth daily. 90 tablet 3   Semaglutide, 2 MG/DOSE, (OZEMPIC, 2 MG/DOSE,) 8 MG/3ML SOPN Inject 2 mg into the skin once a week. 3 mL 0   sertraline (ZOLOFT) 50 MG tablet Take 1.5 tablets (75 mg total) by mouth daily. 135 tablet 0   TART CHERRY PO Take by mouth.     Turmeric 400 MG CAPS Take by mouth.     UNABLE TO FIND Inject 2 mg into the skin once a week. Med Name: Ozempic 2 mg dose (8 mg/3 mL subcutaneous pen injector) 9 mL 0   No facility-administered medications prior to visit.    Allergies  Allergen Reactions   Seasonal Ic [Cholestatin]    Adhesive [Tape] Rash        Objective:    Physical Exam Vitals and nursing note reviewed.  Constitutional:      Appearance: Normal appearance.  HENT:     Right Ear: Tympanic membrane and ear canal normal.     Left Ear: Tympanic membrane and ear canal normal.     Nose:     Right Sinus: No frontal sinus tenderness.     Left Sinus: No frontal sinus tenderness.     Mouth/Throat:     Mouth: Mucous membranes are moist.     Pharynx: Posterior oropharyngeal erythema (mild) present. No pharyngeal swelling or oropharyngeal exudate.     Tonsils: No tonsillar exudate or tonsillar abscesses.  Cardiovascular:     Rate and Rhythm: Normal rate and regular rhythm.  Pulmonary:     Effort: Pulmonary effort is normal.     Breath sounds: Normal breath sounds.  Musculoskeletal:        General: Normal range of motion.  Skin:    General: Skin is warm and dry.  Neurological:     Mental Status: She is alert.  Psychiatric:        Mood and Affect: Mood normal.        Behavior: Behavior normal.    BP 108/70   Pulse (!) 58   Temp 97.7 F (36.5 C) (Temporal)   Ht 5\' 5"  (1.651 m)   Wt 178 lb (80.7 kg)   SpO2 95%   BMI 29.62 kg/m  Wt Readings from Last 3  Encounters:  09/03/21 178 lb (80.7 kg)  08/15/21 170 lb (77.1 kg)  07/18/21 169 lb (76.7 kg)       Assessment & Plan:   Problem List Items Addressed This Visit       Respiratory   Influenza A (H1N1) - Primary    Advised to start Tamiflu today. OK to continue Flonase, tylenol. Drink plenty of fluids. Isolate until at least 3 doses of medication have been taking, wear a mask when going out after that time. Do not go out if having fever, frequent coughing.      Relevant Medications   oseltamivir (TAMIFLU) 75 MG capsule   Other Relevant Orders   POCT Influenza A/B (Completed)   POC COVID-19 (Completed)      Meds ordered this encounter  Medications   oseltamivir (TAMIFLU) 75 MG capsule    Sig: Take 1 capsule (75 mg total) by mouth 2 (two) times daily.    Dispense:  10 capsule    Refill:  0    Order Specific Question:   Supervising Provider    Answer:   ANDY, CAMILLE L [[id number]]

## 2021-09-03 NOTE — Assessment & Plan Note (Signed)
Advised to start Tamiflu today. OK to continue Flonase, tylenol. Drink plenty of fluids. Isolate until at least 3 doses of medication have been taking, wear a mask when going out after that time. Do not go out if having fever, frequent coughing.

## 2021-09-05 ENCOUNTER — Ambulatory Visit (INDEPENDENT_AMBULATORY_CARE_PROVIDER_SITE_OTHER): Payer: PPO | Admitting: Family Medicine

## 2021-09-05 ENCOUNTER — Encounter (INDEPENDENT_AMBULATORY_CARE_PROVIDER_SITE_OTHER): Payer: Self-pay | Admitting: Family Medicine

## 2021-09-05 ENCOUNTER — Other Ambulatory Visit: Payer: Self-pay

## 2021-09-05 VITALS — BP 103/66 | HR 68 | Temp 97.7°F | Ht 65.0 in | Wt 172.0 lb

## 2021-09-05 DIAGNOSIS — E669 Obesity, unspecified: Secondary | ICD-10-CM

## 2021-09-05 DIAGNOSIS — E8881 Metabolic syndrome: Secondary | ICD-10-CM | POA: Diagnosis not present

## 2021-09-05 DIAGNOSIS — Z79899 Other long term (current) drug therapy: Secondary | ICD-10-CM | POA: Diagnosis not present

## 2021-09-05 DIAGNOSIS — E559 Vitamin D deficiency, unspecified: Secondary | ICD-10-CM

## 2021-09-05 DIAGNOSIS — E038 Other specified hypothyroidism: Secondary | ICD-10-CM

## 2021-09-05 DIAGNOSIS — Z6831 Body mass index (BMI) 31.0-31.9, adult: Secondary | ICD-10-CM | POA: Diagnosis not present

## 2021-09-05 MED ORDER — OZEMPIC (2 MG/DOSE) 8 MG/3ML ~~LOC~~ SOPN: 2.0000 mg | PEN_INJECTOR | SUBCUTANEOUS | 1 refills | Status: DC

## 2021-09-06 ENCOUNTER — Encounter (INDEPENDENT_AMBULATORY_CARE_PROVIDER_SITE_OTHER): Payer: Self-pay | Admitting: Family Medicine

## 2021-09-06 LAB — VITAMIN B12: Vitamin B-12: 539 pg/mL (ref 232–1245)

## 2021-09-06 LAB — CBC WITH DIFFERENTIAL/PLATELET
Basophils Absolute: 0 10*3/uL (ref 0.0–0.2)
Basos: 1 %
EOS (ABSOLUTE): 0.1 10*3/uL (ref 0.0–0.4)
Eos: 2 %
Hematocrit: 40.5 % (ref 34.0–46.6)
Hemoglobin: 13.2 g/dL (ref 11.1–15.9)
Immature Grans (Abs): 0 10*3/uL (ref 0.0–0.1)
Immature Granulocytes: 0 %
Lymphocytes Absolute: 2.4 10*3/uL (ref 0.7–3.1)
Lymphs: 38 %
MCH: 29.9 pg (ref 26.6–33.0)
MCHC: 32.6 g/dL (ref 31.5–35.7)
MCV: 92 fL (ref 79–97)
Monocytes Absolute: 0.4 10*3/uL (ref 0.1–0.9)
Monocytes: 6 %
Neutrophils Absolute: 3.3 10*3/uL (ref 1.4–7.0)
Neutrophils: 53 %
Platelets: 186 10*3/uL (ref 150–450)
RBC: 4.42 x10E6/uL (ref 3.77–5.28)
RDW: 13.6 % (ref 11.7–15.4)
WBC: 6.2 10*3/uL (ref 3.4–10.8)

## 2021-09-06 LAB — T4, FREE: Free T4: 1.46 ng/dL (ref 0.82–1.77)

## 2021-09-06 LAB — COMPREHENSIVE METABOLIC PANEL
ALT: 29 IU/L (ref 0–32)
AST: 26 IU/L (ref 0–40)
Albumin/Globulin Ratio: 1.9 (ref 1.2–2.2)
Albumin: 4.5 g/dL (ref 3.8–4.8)
Alkaline Phosphatase: 77 IU/L (ref 44–121)
BUN/Creatinine Ratio: 25 (ref 12–28)
BUN: 14 mg/dL (ref 8–27)
Bilirubin Total: 0.3 mg/dL (ref 0.0–1.2)
CO2: 24 mmol/L (ref 20–29)
Calcium: 8.4 mg/dL — ABNORMAL LOW (ref 8.7–10.3)
Chloride: 100 mmol/L (ref 96–106)
Creatinine, Ser: 0.55 mg/dL — ABNORMAL LOW (ref 0.57–1.00)
Globulin, Total: 2.4 g/dL (ref 1.5–4.5)
Glucose: 88 mg/dL (ref 70–99)
Potassium: 3.4 mmol/L — ABNORMAL LOW (ref 3.5–5.2)
Sodium: 139 mmol/L (ref 134–144)
Total Protein: 6.9 g/dL (ref 6.0–8.5)
eGFR: 99 mL/min/{1.73_m2} (ref >59)

## 2021-09-06 LAB — TSH: TSH: 0.321 u[IU]/mL — ABNORMAL LOW (ref 0.450–4.500)

## 2021-09-06 LAB — VITAMIN D 25 HYDROXY (VIT D DEFICIENCY, FRACTURES): Vit D, 25-Hydroxy: 49.7 ng/mL (ref 30.0–100.0)

## 2021-09-06 NOTE — Progress Notes (Signed)
Chief Complaint:   OBESITY Mary Hernandez is here to discuss her progress with her obesity treatment plan along with follow-up of her obesity related diagnoses. See Medical Weight Management Flowsheet for complete bioelectrical impedance results.  Today's visit was #: 36 Starting weight: 192 lbs Starting date: 07/03/2020 Weight change since last visit: +2 lbs Total lbs lost to date: 20 lbs Total weight loss percentage to date: -10.42%  Nutrition Plan: Category 2 Plan for 75% of the time.  Activity: Walking for 20-30 minutes 4-5 times per week.  Anti-obesity medications: Ozempic 2 mg subcutaneously weekly. Reported side effects: None.  Interim History: Mary Hernandez says she enjoyed Thanksgiving with her family.  Assessment/Plan:   1. Other specified hypothyroidism Medication: levothyroxine 100 mcg daily.   Plan: Patient was instructed not to take MVM or iron within 4 hours of taking thyroid medications. This issue is managed by Dr. Jonni Sanger. We will continue to monitor alongside PCP as it relates to her weight loss journey.  Will check TSH and free T4 today.  Lab Results  Component Value Date   TSH 0.321 (L) 09/05/2021   - TSH - T4, free  2. Metabolic syndrome Starting goal: Lose 7-10% of starting weight. She will continue to focus on protein-rich, low simple carbohydrate foods. We reviewed the importance of hydration, regular exercise for stress reduction, and restorative sleep.  We will continue to check lab work every 3 months, with 10% weight loss, or should any other concerns arise.  Plan:  Refill Ozempic 2 mg subcutaneously weekly on October 07, 2021.  Will check labs today.  - Refill Semaglutide, 2 MG/DOSE, (OZEMPIC, 2 MG/DOSE,) 8 MG/3ML SOPN; Inject 2 mg into the skin once a week.  Dispense: 9 mL; Refill: 1 - CBC with Differential/Platelet - Comprehensive metabolic panel  3. Vitamin D deficiency Improving, but not optimized. She is not taking a vitamin D supplement.  Plan: Will  check vitamin D level today, as per below.  Lab Results  Component Value Date   VD25OH 49.7 09/05/2021   VD25OH 46.6 06/14/2021   VD25OH 52.3 07/03/2020   - VITAMIN D 25 Hydroxy (Vit-D Deficiency, Fractures)  4. Medication management Will check vitamin B12 level today.  - Vitamin B12  5. Obesity BMI today is 28.7  Course: Mary Hernandez is currently in the action stage of change. As such, her goal is to continue with weight loss efforts.   Nutrition goals: She has agreed to the Category 2 Plan.   Exercise goals:  As is.  Behavioral modification strategies: increasing lean protein intake, decreasing simple carbohydrates, increasing vegetables, and increasing water intake.  Mary Hernandez has agreed to follow-up with our clinic in 4 weeks. She was informed of the importance of frequent follow-up visits to maximize her success with intensive lifestyle modifications for her multiple health conditions.   Objective:   Blood pressure 103/66, pulse 68, temperature 97.7 F (36.5 C), temperature source Oral, height 5\' 5"  (1.651 m), weight 172 lb (78 kg), SpO2 97 %. Body mass index is 28.62 kg/m.  General: Cooperative, alert, well developed, in no acute distress. HEENT: Conjunctivae and lids unremarkable. Cardiovascular: Regular rhythm.  Lungs: Normal work of breathing. Neurologic: No focal deficits.   Lab Results  Component Value Date   CREATININE 0.55 (L) 09/05/2021   BUN 14 09/05/2021   NA 139 09/05/2021   K 3.4 (L) 09/05/2021   CL 100 09/05/2021   CO2 24 09/05/2021   Lab Results  Component Value Date   ALT 29  09/05/2021   AST 26 09/05/2021   ALKPHOS 77 09/05/2021   BILITOT 0.3 09/05/2021   Lab Results  Component Value Date   HGBA1C 5.9 (H) 06/14/2021   HGBA1C 6.0 (H) 01/29/2021   HGBA1C 5.9 (H) 10/12/2020   HGBA1C 6.2 (H) 06/21/2020   HGBA1C 5.9 (A) 10/07/2019   Lab Results  Component Value Date   INSULIN 17.4 06/14/2021   INSULIN 27.5 (H) 10/12/2020   INSULIN 21.9  07/03/2020   Lab Results  Component Value Date   TSH 0.321 (L) 09/05/2021   Lab Results  Component Value Date   CHOL 185 06/14/2021   HDL 63 06/14/2021   LDLCALC 102 (H) 06/14/2021   LDLDIRECT 94.0 05/01/2018   TRIG 112 06/14/2021   CHOLHDL 2.9 06/14/2021   Lab Results  Component Value Date   VD25OH 49.7 09/05/2021   VD25OH 46.6 06/14/2021   VD25OH 52.3 07/03/2020   Lab Results  Component Value Date   WBC 6.2 09/05/2021   HGB 13.2 09/05/2021   HCT 40.5 09/05/2021   MCV 92 09/05/2021   PLT 186 09/05/2021   Attestation Statements:   Reviewed by clinician on day of visit: allergies, medications, problem list, medical history, surgical history, family history, social history, and previous encounter notes.  I, Water quality scientist, CMA, am acting as transcriptionist for [name], DO  I have reviewed the above documentation for accuracy and completeness, and I agree with the above. -  [name], DO, MS, FAAFP, DABOM - Family and Bariatric Medicine.

## 2021-09-07 DIAGNOSIS — D225 Melanocytic nevi of trunk: Secondary | ICD-10-CM | POA: Diagnosis not present

## 2021-09-07 DIAGNOSIS — L718 Other rosacea: Secondary | ICD-10-CM | POA: Diagnosis not present

## 2021-09-07 DIAGNOSIS — B351 Tinea unguium: Secondary | ICD-10-CM | POA: Diagnosis not present

## 2021-09-07 DIAGNOSIS — B078 Other viral warts: Secondary | ICD-10-CM | POA: Diagnosis not present

## 2021-09-07 DIAGNOSIS — L918 Other hypertrophic disorders of the skin: Secondary | ICD-10-CM | POA: Diagnosis not present

## 2021-09-07 DIAGNOSIS — L821 Other seborrheic keratosis: Secondary | ICD-10-CM | POA: Diagnosis not present

## 2021-09-07 DIAGNOSIS — L814 Other melanin hyperpigmentation: Secondary | ICD-10-CM | POA: Diagnosis not present

## 2021-09-11 DIAGNOSIS — M65341 Trigger finger, right ring finger: Secondary | ICD-10-CM | POA: Diagnosis not present

## 2021-09-11 DIAGNOSIS — M9902 Segmental and somatic dysfunction of thoracic region: Secondary | ICD-10-CM | POA: Diagnosis not present

## 2021-09-11 DIAGNOSIS — M9906 Segmental and somatic dysfunction of lower extremity: Secondary | ICD-10-CM | POA: Diagnosis not present

## 2021-09-11 DIAGNOSIS — M542 Cervicalgia: Secondary | ICD-10-CM | POA: Diagnosis not present

## 2021-09-11 DIAGNOSIS — M25551 Pain in right hip: Secondary | ICD-10-CM | POA: Diagnosis not present

## 2021-09-11 DIAGNOSIS — M9901 Segmental and somatic dysfunction of cervical region: Secondary | ICD-10-CM | POA: Diagnosis not present

## 2021-09-11 DIAGNOSIS — M9907 Segmental and somatic dysfunction of upper extremity: Secondary | ICD-10-CM | POA: Diagnosis not present

## 2021-09-11 DIAGNOSIS — M25541 Pain in joints of right hand: Secondary | ICD-10-CM | POA: Diagnosis not present

## 2021-09-11 DIAGNOSIS — M65331 Trigger finger, right middle finger: Secondary | ICD-10-CM | POA: Diagnosis not present

## 2021-09-11 DIAGNOSIS — M79672 Pain in left foot: Secondary | ICD-10-CM | POA: Diagnosis not present

## 2021-09-11 DIAGNOSIS — M9908 Segmental and somatic dysfunction of rib cage: Secondary | ICD-10-CM | POA: Diagnosis not present

## 2021-09-13 DIAGNOSIS — H16122 Filamentary keratitis, left eye: Secondary | ICD-10-CM | POA: Diagnosis not present

## 2021-09-14 DIAGNOSIS — H16122 Filamentary keratitis, left eye: Secondary | ICD-10-CM | POA: Diagnosis not present

## 2021-09-19 ENCOUNTER — Other Ambulatory Visit: Payer: Self-pay

## 2021-09-19 ENCOUNTER — Ambulatory Visit: Payer: PPO | Admitting: Internal Medicine

## 2021-09-19 ENCOUNTER — Encounter (INDEPENDENT_AMBULATORY_CARE_PROVIDER_SITE_OTHER): Payer: Self-pay | Admitting: Family Medicine

## 2021-09-19 ENCOUNTER — Encounter: Payer: Self-pay | Admitting: Internal Medicine

## 2021-09-19 VITALS — BP 102/68 | HR 70 | Ht 64.5 in | Wt 181.0 lb

## 2021-09-19 DIAGNOSIS — I493 Ventricular premature depolarization: Secondary | ICD-10-CM

## 2021-09-19 DIAGNOSIS — R7989 Other specified abnormal findings of blood chemistry: Secondary | ICD-10-CM

## 2021-09-19 NOTE — Progress Notes (Deleted)
2.  We will to     Patient Care Team: Leamon Arnt, MD as PCP - General (Family Medicine) Deboraha Sprang, MD as PCP - Electrophysiology (Cardiology) Gerda Diss, DO as Consulting Physician (Sports Medicine) Debbra Riding, MD as Consulting Physician (Ophthalmology) Deboraha Sprang, MD as Consulting Physician (Cardiology) Magnus Sinning, MD as Consulting Physician (Physical Medicine and Rehabilitation) Edythe Clarity, [facility] (Pharmacist)   HPI  Mary Hernandez is a 70 y.o. female seen in followup for symptomatic PACs and PVCs treated with flecainide and once daily acebutolol.   Evaluated and therapy initiated  at [facility] cardiology; also hx of hypokalemia on supplementation.   The patient denies chest pain, shortness of breath, nocturnal dyspnea, orthopne or peripheral edema.  There have been no palpitations, lightheadedness or syncope.  .     Questions regarding potassium supplementation. Previously on higher doses of diuretics.     Date Cr K Hgb TSH  11/22 0.55 3.4 13.2 0.321 ***               DATE TEST EF    2/16 LHC  60-65 % Coronary Art normal  6/17 Echo   75 %    10/19  Calcium Score    0    DATE PR interval QRSduration Dose  4/14 170 90 0  7/18  224 112 100  9/19 218 130 100  11/20 206 132 75  12/21 198 118 75  12/22 214 120 75    Records and Results Reviewed***  Past Medical History:  Diagnosis Date   Acquired hypothyroidism 03/01/2018   hx of goiter; s/p thyroidectomy   Anxiety    Atrial premature depolarization    BPPV (benign paroxysmal positional vertigo) 03/01/2018   Colon polyps    Essential hypertension 03/01/2018   denies hx - on diuretic for Meniere's and beta blocker for PVCs   Frequent PVCs 03/01/2018   Controlled on Flecainide, Acebutolol   Gallbladder problem    GERD (gastroesophageal reflux disease)    Hip pain    History of cardiac catheterization    Nuc 7/16:  normal perfusion; EF 76 // LHC 8/16:  normal  coronary arteries   History of depression    History of dizziness    History of echocardiogram    Echo 03/2006:  Normal LVEF   History of stomach ulcers    HLD (hyperlipidemia)    Hypothyroidism    IBS (irritable bowel syndrome)    Infertility, female    Insulin resistance    Lower back pain    Meniere's disease    Notalgia paresthetica    Prediabetes    PVC (premature ventricular contraction)    Vertigo    Vitamin D deficiency     Past Surgical History:  Procedure Laterality Date   BLADDER SURGERY  1997   BREAST BIOPSY  2013   BREAST REDUCTION SURGERY  1999   CARDIAC CATHETERIZATION  05/2015   Carpel Tunnel Release     CHOLECYSTECTOMY  2011   COLONOSCOPY  08/20/2015   ESOPHAGOGASTRODUODENOSCOPY  01/13/2017   FOOT NEUROMA SURGERY  1986   REDUCTION MAMMAPLASTY     REFRACTIVE SURGERY     S/P Eye Right 1967   Muscle Clipped    THYROIDECTOMY  2012   TONSILLECTOMY  1975   TOTAL ABDOMINAL HYSTERECTOMY  1996    Current Meds  Medication Sig   acebutolol (SECTRAL) 200 MG capsule Take 1 capsule (200 mg total) by mouth daily.  Calcium Citrate-Vitamin D 315-250 MG-UNIT TABS Take by mouth.   cetirizine (ZYRTEC) 10 MG tablet Take 10 mg by mouth daily.   diclofenac Sodium (VOLTAREN) 1 % GEL Apply 2 g topically 4 (four) times daily.   EVENING PRIMROSE OIL PO Take 1,500 mg by mouth 2 (two) times daily.   flecainide (TAMBOCOR) 50 MG tablet Take 1.5 tablets (75 mg total) by mouth 2 (two) times daily.   fluticasone (FLONASE) 50 MCG/ACT nasal spray Place 1 spray into both nostrils daily.   Glucosamine-Chondroit-Vit C-Mn (GLUCOSAMINE 1500 COMPLEX) CAPS Take by mouth.   glycopyrrolate (ROBINUL) 1 MG tablet Take 1 tablet (1 mg total) by mouth 2 (two) times daily.   hydrochlorothiazide (HYDRODIURIL) 25 MG tablet Take 1 tablet (25 mg total) by mouth daily.   levothyroxine (SYNTHROID) 100 MCG tablet Take 1 tablet (100 mcg total) by mouth daily.   naltrexone (DEPADE) 50 MG tablet Take 50 mg  by mouth in the morning and at bedtime.   omega-3 acid ethyl esters (LOVAZA) 1 g capsule Take 1 g by mouth 2 (two) times daily.   ondansetron (ZOFRAN-ODT) 4 MG disintegrating tablet Take 4 mg by mouth every 8 (eight) hours as needed for nausea or vomiting.   Potassium Chloride ER 20 MEQ TBCR Take 20 mEq by mouth daily. Please keep upcoming appt in January 2023 with Dr. Caryl Comes before anymore refills. Thank you   pregabalin (LYRICA) 75 MG capsule Take 75 mg by mouth 2 (two) times daily.   Probiotic Product (PROBIOTIC-10 PO) Take 1 capsule by mouth daily.   rosuvastatin (CRESTOR) 10 MG tablet Take 1 tablet (10 mg total) by mouth daily.   [START ON 10/07/2021] Semaglutide, 2 MG/DOSE, (OZEMPIC, 2 MG/DOSE,) 8 MG/3ML SOPN Inject 2 mg into the skin once a week.   sertraline (ZOLOFT) 50 MG tablet Take 1.5 tablets (75 mg total) by mouth daily.   TART CHERRY PO Take 1 tablet by mouth daily.   Turmeric 400 MG CAPS Take 3 capsules by mouth daily.   UNABLE TO FIND Inject 2 mg into the skin once a week. Med Name: Ozempic 2 mg dose (8 mg/3 mL subcutaneous pen injector)    Allergies  Allergen Reactions   Seasonal Ic [Cholestatin]    Adhesive [Tape] Rash      Review of Systems negative except from HPI and PMH  Physical Exam BP 102/68    Pulse 70    Ht 5' 4.5" (1.638 m)    Wt 181 lb (82.1 kg)    SpO2 93%    BMI 30.59 kg/m  Well developed and well nourished in no acute distress HENT normal E scleral and icterus clear Neck Supple JVP flat; carotids brisk and full Clear to ausculation {CARD RHYTHM:10874} ***Regular rate and rhythm, no murmurs gallops or rub Soft with active bowel sounds No clubbing cyanosis {Numbers; edema:17696} Edema Alert and oriented, grossly normal motor and sensory function Skin Warm and Dry  ECG ***     Assessment and  Plan  PVCs on flecainide   QRS prolongation limits further uptitration of flecainide   Hypokalemia  ***    Current medicines are reviewed at  length with the patient today .  The patient does not*** have concerns regarding medicines.

## 2021-09-19 NOTE — Progress Notes (Signed)
Patient Care Team: Leamon Arnt, MD as PCP - General (Family Medicine) Deboraha Sprang, MD as PCP - Electrophysiology (Cardiology) Gerda Diss, DO as Consulting Physician (Sports Medicine) Debbra Riding, MD as Consulting Physician (Ophthalmology) Deboraha Sprang, MD as Consulting Physician (Cardiology) Magnus Sinning, MD as Consulting Physician (Physical Medicine and Rehabilitation) Edythe Clarity, [facility] (Pharmacist)   HPI  Mary Hernandez is a 70 y.o. female seen in followup for symptomatic PACs and PVCs treated with flecainide and once daily acebutolol.   Evaluated and therapy initiated  at [facility] cardiology; also hx of hypokalemia on supplementation.   The patient denies chest pain, shortness of breath, nocturnal dyspnea, orthopnea or peripheral edema.  There have been no palpitations, lightheadedness or syncope .   She complains of mild aches and pain related to joints. For exercise and alleviating these pains, she does pilates, swimming, and walks.  To assist with weight loss, she is using ozempic.  She is taking HCTZ for "inner ear"   In regards to flecainide, she states that she is tolerating well and has not experienced any heart palpitations.  She has concerns about low calcium levels, and has been taking supplements and eating high calcium food.   Additionally, she had questions regarding what low creatinine meant.  She admits to taking the Acebutolol 1x a day instead of 2x a day.  She denies smoking, and states she does not drink much.        Date Cr K Hgb TSH  11/22 0.55 3.4 13.2 0.321                 DATE TEST EF    2/16 LHC  60-65 % Coronary Art normal  6/17 Echo   75 %    10/19  Calcium Score    0    DATE PR interval QRSduration Dose  4/14 170 90 0  7/18  224 112 100  9/19 218 130 100  11/20 206 132 75  12/21 198 118 75  12/22 214 120 75    Past Medical History:  Diagnosis Date   Acquired hypothyroidism  03/01/2018   hx of goiter; s/p thyroidectomy   Anxiety    Atrial premature depolarization    BPPV (benign paroxysmal positional vertigo) 03/01/2018   Colon polyps    Essential hypertension 03/01/2018   denies hx - on diuretic for Meniere's and beta blocker for PVCs   Frequent PVCs 03/01/2018   Controlled on Flecainide, Acebutolol   Gallbladder problem    GERD (gastroesophageal reflux disease)    Hip pain    History of cardiac catheterization    Nuc 7/16:  normal perfusion; EF 76 // LHC 8/16:  normal coronary arteries   History of depression    History of dizziness    History of echocardiogram    Echo 03/2006:  Normal LVEF   History of stomach ulcers    HLD (hyperlipidemia)    Hypothyroidism    IBS (irritable bowel syndrome)    Infertility, female    Insulin resistance    Lower back pain    Meniere's disease    Notalgia paresthetica    Prediabetes    PVC (premature ventricular contraction)    Vertigo    Vitamin D deficiency     Past Surgical History:  Procedure Laterality Date   [place]   BREAST BIOPSY  2013   [place]  CATHETERIZATION  05/2015   Carpel Tunnel Release     CHOLECYSTECTOMY  2011   COLONOSCOPY  08/20/2015   ESOPHAGOGASTRODUODENOSCOPY  01/13/2017   FOOT NEUROMA SURGERY  1986   REDUCTION MAMMAPLASTY     REFRACTIVE SURGERY     S/P Eye Right 1967   Muscle Clipped    THYROIDECTOMY  2012   TONSILLECTOMY  1975   TOTAL ABDOMINAL HYSTERECTOMY  1996    Current Meds  Medication Sig   acebutolol (SECTRAL) 200 MG capsule Take 1 capsule (200 mg total) by mouth daily.   Calcium Citrate-Vitamin D 315-250 MG-UNIT TABS Take by mouth.   cetirizine (ZYRTEC) 10 MG tablet Take 10 mg by mouth daily.   diclofenac Sodium (VOLTAREN) 1 % GEL Apply 2 g topically 4 (four) times daily.   EVENING PRIMROSE OIL PO Take 1,500 mg by mouth 2 (two) times daily.   flecainide (TAMBOCOR) 50 MG tablet Take 1.5 tablets (75 mg total) by mouth  2 (two) times daily.   fluticasone (FLONASE) 50 MCG/ACT nasal spray Place 1 spray into both nostrils daily.   Glucosamine-Chondroit-Vit C-Mn (GLUCOSAMINE 1500 COMPLEX) CAPS Take by mouth.   glycopyrrolate (ROBINUL) 1 MG tablet Take 1 tablet (1 mg total) by mouth 2 (two) times daily.   hydrochlorothiazide (HYDRODIURIL) 25 MG tablet Take 1 tablet (25 mg total) by mouth daily.   levothyroxine (SYNTHROID) 100 MCG tablet Take 1 tablet (100 mcg total) by mouth daily.   naltrexone (DEPADE) 50 MG tablet Take 50 mg by mouth in the morning and at bedtime.   omega-3 acid ethyl esters (LOVAZA) 1 g capsule Take 1 g by mouth 2 (two) times daily.   ondansetron (ZOFRAN-ODT) 4 MG disintegrating tablet Take 4 mg by mouth every 8 (eight) hours as needed for nausea or vomiting.   Potassium Chloride ER 20 MEQ TBCR Take 20 mEq by mouth daily. Please keep upcoming appt in January 2023 with Dr. Caryl Comes before anymore refills. Thank you   pregabalin (LYRICA) 75 MG capsule Take 75 mg by mouth 2 (two) times daily.   Probiotic Product (PROBIOTIC-10 PO) Take 1 capsule by mouth daily.   rosuvastatin (CRESTOR) 10 MG tablet Take 1 tablet (10 mg total) by mouth daily.   [START ON 10/07/2021] Semaglutide, 2 MG/DOSE, (OZEMPIC, 2 MG/DOSE,) 8 MG/3ML SOPN Inject 2 mg into the skin once a week.   sertraline (ZOLOFT) 50 MG tablet Take 1.5 tablets (75 mg total) by mouth daily.   TART CHERRY PO Take 1 tablet by mouth daily.   Turmeric 400 MG CAPS Take 3 capsules by mouth daily.   UNABLE TO FIND Inject 2 mg into the skin once a week. Med Name: Ozempic 2 mg dose (8 mg/3 mL subcutaneous pen injector)    Allergies  Allergen Reactions   Seasonal Ic [Cholestatin]    Adhesive [Tape] Rash      Review of Systems negative except from HPI and PMH  Physical Exam BP 102/68    Pulse 70    Ht 5' 4.5" (1.638 m)    Wt 181 lb (82.1 kg)    SpO2 93%    BMI 30.59 kg/m  Well developed and well nourished in no acute distress HENT normal E scleral  and icterus clear Neck Supple JVP flat; carotids brisk and full Clear to ausculation Regular rate and rhythm, no murmurs gallops or rub Soft with active bowel sounds No clubbing cyanosis  Edema Alert and oriented, grossly normal motor and sensory function Skin Warm and Dry  ECG sinus @ 70 21/12/42     Assessment and  Plan  PVCs on flecainide   QRS prolongation limits further uptitration of flecainide   Hypokalemia  Inner ear  Hypocalcemia  Palpitations are quiescient.  We will continue flecainide 75 mg twice daily.  We will review in 6 months QRS prolongation; I suspect she will need dose reduction to 50 mg twice daily.  We will also continue acebutolol 200 mg, taking it daily which works.  As she does not have atrial fibrillation twice daily is not necessary.  Hypokalemia remains an issue.  She has been put on hydrochlorothiazide for her inner ear.  I wonder whether we can avoid further potassium supplementation perhaps discontinue potassium supplementation if her other physician is willing to try her on triamterene hydrochlorothiazide with this potassium sparing capability.  She will be seeing him on Friday and we will repeat Potassium in about 3 weeks.  She also has concern about low calcium; she is trying to do this with calcium supplementation.  We will check an ionized calcium and then refer her to endocrinology as necessary.  Current medicines are reviewed at length with the patient today .  The patient does not  have concerns regarding medicines.   I,Jordan Kelly,acting as a Education administrator for Virl Axe, MD.,have documented all relevant documentation on the behalf of Virl Axe, MD,as directed by  Virl Axe, MD while in the presence of Virl Axe, MD. I, Virl Axe, MD, have reviewed all documentation for this visit. The documentation on 09/19/21 for the exam, diagnosis, procedures, and orders are all accurate and complete.

## 2021-09-19 NOTE — Patient Instructions (Signed)
Medication Instructions:  Your physician recommends that you continue on your current medications as directed. Please refer to the Current Medication list given to you today.  *If you need a refill on your cardiac medications before your next appointment, please call your pharmacy*   Lab Work: Ionized Calcium and BMET to be scheduled  If you have labs (blood work) drawn today and your tests are completely normal, you will receive your results only by: MyChart Message (if you have MyChart) OR A paper copy in the mail If you have any lab test that is abnormal or we need to change your treatment, we will call you to review the results.   Testing/Procedures: None ordered.    Follow-Up: At [facility], you and your health needs are our priority.  As part of our continuing mission to provide you with exceptional heart care, we have created designated Provider Care Teams.  These Care Teams include your primary Cardiologist (physician) and Advanced Practice Providers (APPs -  Physician Assistants and Nurse Practitioners) who all work together to provide you with the care you need, when you need it.  We recommend signing up for the patient portal called "MyChart".  Sign up information is provided on this After Visit Summary.  MyChart is used to connect with patients for Virtual Visits (Telemedicine).  Patients are able to view lab/test results, encounter notes, upcoming appointments, etc.  Non-urgent messages can be sent to your provider as well.   To learn more about what you can do with MyChart, go to [email or web address].    Your next appointment:   6 months with Dr Caryl Comes

## 2021-09-20 ENCOUNTER — Ambulatory Visit (INDEPENDENT_AMBULATORY_CARE_PROVIDER_SITE_OTHER): Payer: PPO | Admitting: Family Medicine

## 2021-09-20 ENCOUNTER — Encounter (INDEPENDENT_AMBULATORY_CARE_PROVIDER_SITE_OTHER): Payer: Self-pay | Admitting: Family Medicine

## 2021-09-20 VITALS — BP 93/58 | HR 66 | Temp 97.8°F | Ht 65.0 in | Wt 175.0 lb

## 2021-09-20 DIAGNOSIS — E8881 Metabolic syndrome: Secondary | ICD-10-CM

## 2021-09-20 DIAGNOSIS — H8109 Meniere's disease, unspecified ear: Secondary | ICD-10-CM | POA: Diagnosis not present

## 2021-09-20 DIAGNOSIS — Z6831 Body mass index (BMI) 31.0-31.9, adult: Secondary | ICD-10-CM

## 2021-09-20 DIAGNOSIS — F411 Generalized anxiety disorder: Secondary | ICD-10-CM | POA: Diagnosis not present

## 2021-09-20 DIAGNOSIS — R42 Dizziness and giddiness: Secondary | ICD-10-CM

## 2021-09-20 DIAGNOSIS — E669 Obesity, unspecified: Secondary | ICD-10-CM

## 2021-09-20 MED ORDER — TRIAMTERENE-HCTZ 37.5-25 MG PO TABS: 0.5000 | ORAL_TABLET | Freq: Every day | ORAL | 3 refills | Status: DC

## 2021-09-21 DIAGNOSIS — M25541 Pain in joints of right hand: Secondary | ICD-10-CM | POA: Diagnosis not present

## 2021-09-21 DIAGNOSIS — M65331 Trigger finger, right middle finger: Secondary | ICD-10-CM | POA: Diagnosis not present

## 2021-09-24 NOTE — Progress Notes (Signed)
Chief Complaint:   OBESITY Mary Hernandez is here to discuss her progress with her obesity treatment plan along with follow-up of her obesity related diagnoses. See Medical Weight Management Flowsheet for complete bioelectrical impedance results.  Today's visit was #: 64 Starting weight: 192 lbs Starting date: 07/03/2020 Weight change since last visit: +3 lbs Total lbs lost to date: 17 lbs Total weight loss percentage to date: -8.85%  Nutrition Plan: Category 2 Plan for 70% of the time.  Activity: Water aerobics for 30-60 minutes 7 times per week.  Anti-obesity medications: Ozempic 2 mg subcutaneously weekly. Reported side effects: None.  Interim History: [name] uses [facility]. Discussed recent Cardiology visit. Hx of hypokalemia. Okay to trial Maxide.   Assessment/Plan:   1. Vertigo, Meniere's disease Start triamterine-HCTZ 37.5-25 mg 1/2 tablet daily.  Stop HCTZ.  - Start triamterene-hydrochlorothiazide (MAXZIDE-25) 37.5-25 MG tablet; Take 0.5 tablets by mouth daily.  Dispense: 45 tablet; Refill: 3  2. Metabolic syndrome Starting goal: Lose 7-10% of starting weight. She will continue to focus on protein-rich, low simple carbohydrate foods. We reviewed the importance of hydration, regular exercise for stress reduction, and restorative sleep.  We will continue to check lab work every 3 months, with 10% weight loss, or should any other concerns arise.  3. GAD (generalized anxiety disorder) [name] is taking Zoloft 75 mg daily.   Plan:  The current medical regimen is effective;  continue present plan and medications. Behavior modification techniques were discussed today to help [name] deal with her anxiety.  4. Obesity, current BMI 29.1  Course: [name] is currently in the action stage of change. As such, her goal is to continue with weight loss efforts.   Nutrition goals: She has agreed to the Category 2 Plan.   Exercise goals:  As is.  Behavioral modification strategies: increasing  lean protein intake, decreasing simple carbohydrates, increasing vegetables, and increasing water intake.  [name] has agreed to follow-up with our clinic in 4 weeks. She was informed of the importance of frequent follow-up visits to maximize her success with intensive lifestyle modifications for her multiple health conditions.   Objective:   Blood pressure (!) 93/58, pulse 66, temperature 97.8 F (36.6 C), temperature source Oral, height 5\' 5"  (1.651 m), weight 175 lb (79.4 kg), SpO2 95 %. Body mass index is 29.12 kg/m.  General: Cooperative, alert, well developed, in no acute distress. HEENT: Conjunctivae and lids unremarkable. Cardiovascular: Regular rhythm.  Lungs: Normal work of breathing. Neurologic: No focal deficits.   Lab Results  Component Value Date   CREATININE 0.55 (L) 09/05/2021   BUN 14 09/05/2021   NA 139 09/05/2021   K 3.4 (L) 09/05/2021   CL 100 09/05/2021   CO2 24 09/05/2021   Lab Results  Component Value Date   ALT 29 09/05/2021   AST 26 09/05/2021   ALKPHOS 77 09/05/2021   BILITOT 0.3 09/05/2021   Lab Results  Component Value Date   HGBA1C 5.9 (H) 06/14/2021   HGBA1C 6.0 (H) 01/29/2021   HGBA1C 5.9 (H) 10/12/2020   HGBA1C 6.2 (H) 06/21/2020   HGBA1C 5.9 (A) 10/07/2019   Lab Results  Component Value Date   INSULIN 17.4 06/14/2021   INSULIN 27.5 (H) 10/12/2020   INSULIN 21.9 07/03/2020   Lab Results  Component Value Date   TSH 0.321 (L) 09/05/2021   Lab Results  Component Value Date   CHOL 185 06/14/2021   HDL 63 06/14/2021   LDLCALC 102 (H) 06/14/2021   LDLDIRECT 94.0 05/01/2018  TRIG 112 06/14/2021   CHOLHDL 2.9 06/14/2021   Lab Results  Component Value Date   VD25OH 49.7 09/05/2021   VD25OH 46.6 06/14/2021   VD25OH 52.3 07/03/2020   Lab Results  Component Value Date   WBC 6.2 09/05/2021   HGB 13.2 09/05/2021   HCT 40.5 09/05/2021   MCV 92 09/05/2021   PLT 186 09/05/2021   Attestation Statements:   Reviewed by clinician  on day of visit: allergies, medications, problem list, medical history, surgical history, family history, social history, and previous encounter notes.  I, Water quality scientist, CMA, am acting as transcriptionist for [name], DO  I have reviewed the above documentation for accuracy and completeness, and I agree with the above. -  [name], DO, MS, FAAFP, DABOM - Family and Bariatric Medicine.

## 2021-10-02 ENCOUNTER — Ambulatory Visit: Payer: PPO | Admitting: Internal Medicine

## 2021-10-04 DIAGNOSIS — M25551 Pain in right hip: Secondary | ICD-10-CM | POA: Diagnosis not present

## 2021-10-04 DIAGNOSIS — M9908 Segmental and somatic dysfunction of rib cage: Secondary | ICD-10-CM | POA: Diagnosis not present

## 2021-10-04 DIAGNOSIS — M9906 Segmental and somatic dysfunction of lower extremity: Secondary | ICD-10-CM | POA: Diagnosis not present

## 2021-10-04 DIAGNOSIS — M79672 Pain in left foot: Secondary | ICD-10-CM | POA: Diagnosis not present

## 2021-10-04 DIAGNOSIS — M9901 Segmental and somatic dysfunction of cervical region: Secondary | ICD-10-CM | POA: Diagnosis not present

## 2021-10-04 DIAGNOSIS — M9902 Segmental and somatic dysfunction of thoracic region: Secondary | ICD-10-CM | POA: Diagnosis not present

## 2021-10-04 DIAGNOSIS — M542 Cervicalgia: Secondary | ICD-10-CM | POA: Diagnosis not present

## 2021-10-04 DIAGNOSIS — M9905 Segmental and somatic dysfunction of pelvic region: Secondary | ICD-10-CM | POA: Diagnosis not present

## 2021-10-16 ENCOUNTER — Ambulatory Visit: Payer: PPO | Admitting: Internal Medicine

## 2021-10-17 ENCOUNTER — Telehealth (INDEPENDENT_AMBULATORY_CARE_PROVIDER_SITE_OTHER): Payer: Self-pay

## 2021-10-17 ENCOUNTER — Encounter (INDEPENDENT_AMBULATORY_CARE_PROVIDER_SITE_OTHER): Payer: Self-pay

## 2021-10-17 DIAGNOSIS — F4323 Adjustment disorder with mixed anxiety and depressed mood: Secondary | ICD-10-CM

## 2021-10-17 DIAGNOSIS — E8881 Metabolic syndrome: Secondary | ICD-10-CM

## 2021-10-17 DIAGNOSIS — R42 Dizziness and giddiness: Secondary | ICD-10-CM

## 2021-10-17 NOTE — Telephone Encounter (Signed)
Ref #[id number] Requesting updated benefits card in order to continue with the Ozempic 2mg  dose request.

## 2021-10-17 NOTE — Telephone Encounter (Signed)
Msg sent to pt to inquire about her insurance to see if it has changed.

## 2021-10-18 ENCOUNTER — Ambulatory Visit: Payer: PPO | Admitting: Internal Medicine

## 2021-10-22 DIAGNOSIS — M542 Cervicalgia: Secondary | ICD-10-CM | POA: Diagnosis not present

## 2021-10-22 DIAGNOSIS — M797 Fibromyalgia: Secondary | ICD-10-CM | POA: Diagnosis not present

## 2021-10-22 DIAGNOSIS — M9908 Segmental and somatic dysfunction of rib cage: Secondary | ICD-10-CM | POA: Diagnosis not present

## 2021-10-22 DIAGNOSIS — G44209 Tension-type headache, unspecified, not intractable: Secondary | ICD-10-CM | POA: Diagnosis not present

## 2021-10-22 DIAGNOSIS — M9902 Segmental and somatic dysfunction of thoracic region: Secondary | ICD-10-CM | POA: Diagnosis not present

## 2021-10-22 DIAGNOSIS — M9901 Segmental and somatic dysfunction of cervical region: Secondary | ICD-10-CM | POA: Diagnosis not present

## 2021-10-23 ENCOUNTER — Ambulatory Visit (INDEPENDENT_AMBULATORY_CARE_PROVIDER_SITE_OTHER): Payer: PPO | Admitting: Family Medicine

## 2021-10-23 ENCOUNTER — Encounter (INDEPENDENT_AMBULATORY_CARE_PROVIDER_SITE_OTHER): Payer: Self-pay | Admitting: Family Medicine

## 2021-10-23 ENCOUNTER — Other Ambulatory Visit: Payer: PPO | Admitting: *Deleted

## 2021-10-23 ENCOUNTER — Other Ambulatory Visit: Payer: Self-pay

## 2021-10-23 VITALS — BP 96/62 | HR 61 | Temp 97.6°F | Ht 65.0 in | Wt 168.0 lb

## 2021-10-23 DIAGNOSIS — R7303 Prediabetes: Secondary | ICD-10-CM | POA: Diagnosis not present

## 2021-10-23 DIAGNOSIS — R7989 Other specified abnormal findings of blood chemistry: Secondary | ICD-10-CM

## 2021-10-23 DIAGNOSIS — F4323 Adjustment disorder with mixed anxiety and depressed mood: Secondary | ICD-10-CM

## 2021-10-23 DIAGNOSIS — Z6831 Body mass index (BMI) 31.0-31.9, adult: Secondary | ICD-10-CM

## 2021-10-23 DIAGNOSIS — I493 Ventricular premature depolarization: Secondary | ICD-10-CM | POA: Diagnosis not present

## 2021-10-23 DIAGNOSIS — Z6828 Body mass index (BMI) 28.0-28.9, adult: Secondary | ICD-10-CM | POA: Diagnosis not present

## 2021-10-23 DIAGNOSIS — E669 Obesity, unspecified: Secondary | ICD-10-CM | POA: Diagnosis not present

## 2021-10-23 MED ORDER — SERTRALINE HCL 50 MG PO TABS: 75.0000 mg | ORAL_TABLET | Freq: Every day | ORAL | 3 refills | Status: DC

## 2021-10-24 ENCOUNTER — Telehealth (INDEPENDENT_AMBULATORY_CARE_PROVIDER_SITE_OTHER): Payer: Self-pay | Admitting: Family Medicine

## 2021-10-24 ENCOUNTER — Telehealth: Payer: Self-pay | Admitting: Internal Medicine

## 2021-10-24 LAB — BASIC METABOLIC PANEL
BUN/Creatinine Ratio: 23 (ref 12–28)
BUN/Creatinine Ratio: 22 (ref 12–28)
BUN: 16 mg/dL (ref 8–27)
BUN: 14 mg/dL (ref 8–27)
CO2: 25 mmol/L (ref 20–29)
CO2: 24 mmol/L (ref 20–29)
Calcium: 9 mg/dL (ref 8.7–10.3)
Calcium: 9.8 mg/dL (ref 8.7–10.3)
Chloride: 101 mmol/L (ref 96–106)
Chloride: 99 mmol/L (ref 96–106)
Creatinine, Ser: 0.71 mg/dL (ref 0.57–1.00)
Creatinine, Ser: 0.65 mg/dL (ref 0.57–1.00)
Glucose: 83 mg/dL (ref 70–99)
Glucose: 84 mg/dL (ref 70–99)
Potassium: 4.3 mmol/L (ref 3.5–5.2)
Potassium: 4.2 mmol/L (ref 3.5–5.2)
Sodium: 139 mmol/L (ref 134–144)
Sodium: 139 mmol/L (ref 134–144)
eGFR: 92 mL/min/{1.73_m2} (ref >59)
eGFR: 95 mL/min/{1.73_m2} (ref >59)

## 2021-10-24 LAB — CALCIUM, IONIZED
Calcium, Ion: 4.8 mg/dL (ref 4.5–5.6)
Calcium, Ion: 5.3 mg/dL (ref 4.5–5.6)

## 2021-10-24 LAB — HEMOGLOBIN A1C
Est. average glucose Bld gHb Est-mCnc: 120 mg/dL
Hgb A1c MFr Bld: 5.8 % — ABNORMAL HIGH (ref 4.8–5.6)

## 2021-10-24 NOTE — Progress Notes (Signed)
Chief Complaint:   OBESITY Mary Hernandez is here to discuss her progress with her obesity treatment plan along with follow-up of her obesity related diagnoses. See Medical Weight Management Flowsheet for complete bioelectrical impedance results.  Today's visit was #: 20 Starting weight: 192 lbs Starting date: 07/03/2020 Weight change since last visit: 7 lbs Total lbs lost to date: 24 lbs Total weight loss percentage to date: -12.50%  Nutrition Plan: Category 2 Plan for 70% of the time.  Activity: [employer], swimming, pilates, walking for 60 minutes 5 times per week.  Anti-obesity medications: Ozempic 2 mg subcutaneously weekly. Reported side effects: None.  Interim History: Tahjanae had labs today with Dr. Caryl Comes.  Her insurance coverage may be better now re: GLP1RA coverage.  Assessment/Plan:   1. Prediabetes, with polyphagia Not optimized. Goal is HgbA1c < 5.7.  Medication: Ozempic 2 mg subcutaneously weekly.    Plan:  Continue Ozempic 2 mg subcutaneously weekly. She will continue to focus on protein-rich, low simple carbohydrate foods. We reviewed the importance of hydration, regular exercise for stress reduction, and restorative sleep.  Check A1c today.   Lab Results  Component Value Date   HGBA1C 5.8 (H) 10/23/2021   Lab Results  Component Value Date   INSULIN 17.4 06/14/2021   INSULIN 27.5 (H) 10/12/2020   INSULIN 21.9 07/03/2020   - Hemoglobin A1c  2. Frequent PVCs Semya is followed by Cardiology.  3. Situational mixed anxiety and depressive disorder Chalise is taking Zoloft 75 mg daily.   Plan:  The current medical regimen is effective;  continue present plan and medications. Behavior modification techniques were discussed today to help Brendolyn deal with her anxiety and depression.  - Refill sertraline (ZOLOFT) 50 MG tablet; Take 1.5 tablets (75 mg total) by mouth daily.  Dispense: 135 tablet; Refill: 3  4. Obesity, current BMI 28.1  Course: Dede is currently in the action  stage of change. As such, her goal is to continue with weight loss efforts.   Nutrition goals: She has agreed to the Category 2 Plan.   Exercise goals:  As is.  Behavioral modification strategies: increasing lean protein intake, decreasing simple carbohydrates, increasing vegetables, and increasing water intake.  Contina has agreed to follow-up with our clinic in 4 weeks. She was informed of the importance of frequent follow-up visits to maximize her success with intensive lifestyle modifications for her multiple health conditions.   Ketara was informed we would discuss her lab results at her next visit unless there is a critical issue that needs to be addressed sooner. Chezney agreed to keep her next visit at the agreed upon time to discuss these results.  Objective:   Blood pressure 96/62, pulse 61, temperature 97.6 F (36.4 C), height 5\' 5"  (1.651 m), weight 168 lb (76.2 kg), SpO2 99 %. Body mass index is 27.96 kg/m.  General: Cooperative, alert, well developed, in no acute distress. HEENT: Conjunctivae and lids unremarkable. Cardiovascular: Regular rhythm.  Lungs: Normal work of breathing. Neurologic: No focal deficits.   Lab Results  Component Value Date   CREATININE 0.65 10/23/2021   BUN 14 10/23/2021   NA 139 10/23/2021   K 4.2 10/23/2021   CL 99 10/23/2021   CO2 24 10/23/2021   Lab Results  Component Value Date   ALT 29 09/05/2021   AST 26 09/05/2021   ALKPHOS 77 09/05/2021   BILITOT 0.3 09/05/2021   Lab Results  Component Value Date   HGBA1C 5.8 (H) 10/23/2021   HGBA1C 5.9 (  H) 06/14/2021   HGBA1C 6.0 (H) 01/29/2021   HGBA1C 5.9 (H) 10/12/2020   HGBA1C 6.2 (H) 06/21/2020   Lab Results  Component Value Date   INSULIN 17.4 06/14/2021   INSULIN 27.5 (H) 10/12/2020   INSULIN 21.9 07/03/2020   Lab Results  Component Value Date   TSH 0.321 (L) 09/05/2021   Lab Results  Component Value Date   CHOL 185 06/14/2021   HDL 63 06/14/2021   LDLCALC 102 (H)  06/14/2021   LDLDIRECT 94.0 05/01/2018   TRIG 112 06/14/2021   CHOLHDL 2.9 06/14/2021   Lab Results  Component Value Date   VD25OH 49.7 09/05/2021   VD25OH 46.6 06/14/2021   VD25OH 52.3 07/03/2020   Lab Results  Component Value Date   WBC 6.2 09/05/2021   HGB 13.2 09/05/2021   HCT 40.5 09/05/2021   MCV 92 09/05/2021   PLT 186 09/05/2021   Attestation Statements:   Reviewed by clinician on day of visit: allergies, medications, problem list, medical history, surgical history, family history, social history, and previous encounter notes.  I, Water quality scientist, CMA, am acting as transcriptionist for [name], DO  I have reviewed the above documentation for accuracy and completeness, and I agree with the above. -  [name], DO, MS, FAAFP, DABOM - Family and Bariatric Medicine.

## 2021-10-24 NOTE — Telephone Encounter (Signed)
°  Casandra with [facility] calling, she requesting getting a copy of pt's A1c

## 2021-10-24 NOTE — Telephone Encounter (Signed)
Patient states that she must have Prior Authorization complete today with [place]. She wants Dr. Juleen China or staff to call [phone], Option 2 to approve Prior Authorization for Ozempic pens. Patient states she is out of Medication. Patient also asked to be called today by medical staff at number on file.       [facility].

## 2021-10-25 ENCOUNTER — Encounter: Payer: Self-pay | Admitting: Internal Medicine

## 2021-10-25 MED ORDER — OZEMPIC (2 MG/DOSE) 8 MG/3ML ~~LOC~~ SOPN: 2.0000 mg | PEN_INJECTOR | SUBCUTANEOUS | 1 refills | Status: DC

## 2021-10-25 NOTE — Telephone Encounter (Signed)
The pt called in and stated that she was calling in to check the status of her Ozempic. The pt states she is out of Medicaine and needs it filled asap. I told her I will send a message. The pt did state if the pharmacy cant do we have an refills for her to get her till then. Please Arnoldo Hooker

## 2021-10-25 NOTE — Telephone Encounter (Signed)
PA done, sent pt mychart message.

## 2021-11-01 MED ORDER — TRIAMTERENE-HCTZ 37.5-25 MG PO TABS: 0.5000 | ORAL_TABLET | Freq: Every day | ORAL | 3 refills | Status: DC

## 2021-11-01 NOTE — Addendum Note (Signed)
Addended by: Karren Cobble on: 11/01/2021 09:17 AM   Modules accepted: Orders

## 2021-11-05 DIAGNOSIS — M542 Cervicalgia: Secondary | ICD-10-CM | POA: Diagnosis not present

## 2021-11-05 DIAGNOSIS — M9902 Segmental and somatic dysfunction of thoracic region: Secondary | ICD-10-CM | POA: Diagnosis not present

## 2021-11-05 DIAGNOSIS — M25551 Pain in right hip: Secondary | ICD-10-CM | POA: Diagnosis not present

## 2021-11-05 DIAGNOSIS — M9908 Segmental and somatic dysfunction of rib cage: Secondary | ICD-10-CM | POA: Diagnosis not present

## 2021-11-05 DIAGNOSIS — M9905 Segmental and somatic dysfunction of pelvic region: Secondary | ICD-10-CM | POA: Diagnosis not present

## 2021-11-05 DIAGNOSIS — M9901 Segmental and somatic dysfunction of cervical region: Secondary | ICD-10-CM | POA: Diagnosis not present

## 2021-11-05 DIAGNOSIS — M9906 Segmental and somatic dysfunction of lower extremity: Secondary | ICD-10-CM | POA: Diagnosis not present

## 2021-11-13 ENCOUNTER — Other Ambulatory Visit: Payer: Self-pay

## 2021-11-13 ENCOUNTER — Ambulatory Visit (INDEPENDENT_AMBULATORY_CARE_PROVIDER_SITE_OTHER): Payer: PPO | Admitting: Family Medicine

## 2021-11-13 ENCOUNTER — Encounter (INDEPENDENT_AMBULATORY_CARE_PROVIDER_SITE_OTHER): Payer: Self-pay | Admitting: Family Medicine

## 2021-11-13 VITALS — BP 105/64 | HR 62 | Temp 97.7°F | Ht 65.0 in | Wt 170.0 lb

## 2021-11-13 DIAGNOSIS — E669 Obesity, unspecified: Secondary | ICD-10-CM

## 2021-11-13 DIAGNOSIS — Z6828 Body mass index (BMI) 28.0-28.9, adult: Secondary | ICD-10-CM | POA: Diagnosis not present

## 2021-11-13 DIAGNOSIS — R7303 Prediabetes: Secondary | ICD-10-CM | POA: Diagnosis not present

## 2021-11-13 DIAGNOSIS — F4323 Adjustment disorder with mixed anxiety and depressed mood: Secondary | ICD-10-CM | POA: Diagnosis not present

## 2021-11-13 DIAGNOSIS — M797 Fibromyalgia: Secondary | ICD-10-CM

## 2021-11-14 NOTE — Progress Notes (Signed)
Chief Complaint:   OBESITY Mary Hernandez is here to discuss her progress with her obesity treatment plan along with follow-up of her obesity related diagnoses. See Medical Weight Management Flowsheet for complete bioelectrical impedance results.  Today's visit was #: 21 Starting weight: 192 lbs Starting date: 07/03/2020 Weight change since last visit: +2 lbs Total lbs lost to date: 22 lbs Total weight loss percentage to date: -11.46%  Nutrition Plan: Category 2 Plan for 75% of the time.  Activity: [employer], swimming, pilates, walking for 30-60 minutes 5-7 times per week.  Anti-obesity medications: Ozempic 2 mg subcutaneously weekly. Reported side effects: None.  Interim History: Mary Hernandez says she overindulged with social eating last week.  Assessment/Plan:   1. Prediabetes, with polyphagia Improving, but not optimized. Goal is HgbA1c < 5.7.  Medication: Ozempic 2 mg subcutaneously weekly.    Plan: Continue Ozempic 2 mg subcutaneously weekly.  She will continue to focus on protein-rich, low simple carbohydrate foods. We reviewed the importance of hydration, regular exercise for stress reduction, and restorative sleep.   Lab Results  Component Value Date   HGBA1C 5.8 (H) 10/23/2021   Lab Results  Component Value Date   INSULIN 17.4 06/14/2021   INSULIN 27.5 (H) 10/12/2020   INSULIN 21.9 07/03/2020   2. Fibromyalgia The current medical regimen is effective;  continue present plan and medications.  3. Situational mixed anxiety and depressive disorder Mary Hernandez is taking Zoloft 75 mg daily.   Plan:  The current medical regimen is effective;  continue present plan and medications. Behavior modification techniques were discussed today to help Mary Hernandez deal with her anxiety and depression.  4. Obesity, current BMI 28.4  Course: Mary Hernandez is currently in the action stage of change. As such, her goal is to continue with weight loss efforts.   Nutrition goals: She has agreed to the Category 2 Plan.    Exercise goals:  As is.  Behavioral modification strategies: increasing lean protein intake, decreasing simple carbohydrates, and increasing vegetables.  Mary Hernandez has agreed to follow-up with our clinic in 4 weeks. She was informed of the importance of frequent follow-up visits to maximize her success with intensive lifestyle modifications for her multiple health conditions.   Objective:   Blood pressure 105/64, pulse 62, temperature 97.7 F (36.5 C), temperature source Oral, height 5\' 5"  (1.651 m), weight 170 lb (77.1 kg), SpO2 97 %. Body mass index is 28.29 kg/m.  General: Cooperative, alert, well developed, in no acute distress. HEENT: Conjunctivae and lids unremarkable. Cardiovascular: Regular rhythm.  Lungs: Normal work of breathing. Neurologic: No focal deficits.   Lab Results  Component Value Date   CREATININE 0.65 10/23/2021   BUN 14 10/23/2021   NA 139 10/23/2021   K 4.2 10/23/2021   CL 99 10/23/2021   CO2 24 10/23/2021   Lab Results  Component Value Date   ALT 29 09/05/2021   AST 26 09/05/2021   ALKPHOS 77 09/05/2021   BILITOT 0.3 09/05/2021   Lab Results  Component Value Date   HGBA1C 5.8 (H) 10/23/2021   HGBA1C 5.9 (H) 06/14/2021   HGBA1C 6.0 (H) 01/29/2021   HGBA1C 5.9 (H) 10/12/2020   HGBA1C 6.2 (H) 06/21/2020   Lab Results  Component Value Date   INSULIN 17.4 06/14/2021   INSULIN 27.5 (H) 10/12/2020   INSULIN 21.9 07/03/2020   Lab Results  Component Value Date   TSH 0.321 (L) 09/05/2021   Lab Results  Component Value Date   CHOL 185 06/14/2021   HDL  63 06/14/2021   LDLCALC 102 (H) 06/14/2021   LDLDIRECT 94.0 05/01/2018   TRIG 112 06/14/2021   CHOLHDL 2.9 06/14/2021   Lab Results  Component Value Date   VD25OH 49.7 09/05/2021   VD25OH 46.6 06/14/2021   VD25OH 52.3 07/03/2020   Lab Results  Component Value Date   WBC 6.2 09/05/2021   HGB 13.2 09/05/2021   HCT 40.5 09/05/2021   MCV 92 09/05/2021   PLT 186 09/05/2021    Attestation Statements:   Reviewed by clinician on day of visit: allergies, medications, problem list, medical history, surgical history, family history, social history, and previous encounter notes.  I, Water quality scientist, CMA, am acting as transcriptionist for [name], DO  I have reviewed the above documentation for accuracy and completeness, and I agree with the above. -  [name], DO, MS, FAAFP, DABOM - Family and Bariatric Medicine.

## 2021-11-16 ENCOUNTER — Telehealth: Payer: Self-pay | Admitting: Pharmacist

## 2021-11-16 NOTE — Progress Notes (Addendum)
Chronic Care Management Pharmacy Assistant   Name: Mary Hernandez  MRN: [id number] DOB: 03/15/1951   Reason for Encounter: Medication Coordination Call    Recent office visits:  09/03/2021 Marrianne Mood, NP; Advised patient to start Tamiflu today. OK to continue Flonase, tylenol.   Recent consult visits:  11/13/2021 OV ([facility]) [name], DO; no medication changes indicated.  10/23/2021 OV ([facility]) [name], DO; no medication changes indicated.  09/20/2021 OV ([facility]) [name], DO; Start triamterine-HCTZ 37.5-25 mg 1/2 tablet daily.  Stop HCTZ  09/19/2021 OV (Cardiology) Deboraha Sprang, MD; Palpitations are quiescient.  We will continue flecainide 75 mg twice daily.  We will review in 6 months QRS prolongation; I suspect she will need dose reduction to 50 mg twice daily. She has been put on hydrochlorothiazide for her inner ear.  I wonder whether we can avoid further potassium supplementation perhaps discontinue potassium supplementation if her other physician is willing to try her on triamterene hydrochlorothiazide with this potassium sparing capability.  09/05/2021 OV ([facility]) [name], DO; Patient was instructed not to take MVM or iron within 4 hours of taking thyroid medications. This issue is managed by Dr. Jonni Sanger. We will continue to monitor alongside PCP as it relates to her weight loss journey  Hospital visits:  None in previous 6 months  Medications: Outpatient Encounter Medications as of 11/16/2021  Medication Sig   acebutolol (SECTRAL) 200 MG capsule Take 1 capsule (200 mg total) by mouth daily.   Calcium Citrate-Vitamin D 315-250 MG-UNIT TABS Take by mouth.   cetirizine (ZYRTEC) 10 MG tablet Take 10 mg by mouth daily.   diclofenac Sodium (VOLTAREN) 1 % GEL Apply 2 g topically 4 (four) times daily.   EVENING PRIMROSE OIL PO Take 1,500 mg by mouth 2 (two) times daily.   flecainide (TAMBOCOR) 50 MG tablet Take 1.5 tablets (75 mg total) by mouth 2  (two) times daily.   fluticasone (FLONASE) 50 MCG/ACT nasal spray Place 1 spray into both nostrils daily.   Glucosamine-Chondroit-Vit C-Mn (GLUCOSAMINE 1500 COMPLEX) CAPS Take by mouth.   glycopyrrolate (ROBINUL) 1 MG tablet Take 1 tablet (1 mg total) by mouth 2 (two) times daily.   levothyroxine (SYNTHROID) 100 MCG tablet Take 1 tablet (100 mcg total) by mouth daily.   naltrexone (DEPADE) 50 MG tablet Take 50 mg by mouth in the morning and at bedtime.   omega-3 acid ethyl esters (LOVAZA) 1 g capsule Take 1 g by mouth 2 (two) times daily.   ondansetron (ZOFRAN-ODT) 4 MG disintegrating tablet Take 4 mg by mouth every 8 (eight) hours as needed for nausea or vomiting.   Potassium Chloride ER 20 MEQ TBCR Take 20 mEq by mouth daily. Please keep upcoming appt in January 2023 with Dr. Caryl Comes before anymore refills. Thank you   pregabalin (LYRICA) 75 MG capsule Take 75 mg by mouth 2 (two) times daily.   Probiotic Product (PROBIOTIC-10 PO) Take 1 capsule by mouth daily.   rosuvastatin (CRESTOR) 10 MG tablet Take 1 tablet (10 mg total) by mouth daily.   Semaglutide, 2 MG/DOSE, (OZEMPIC, 2 MG/DOSE,) 8 MG/3ML SOPN Inject 2 mg into the skin once a week.   sertraline (ZOLOFT) 50 MG tablet Take 1.5 tablets (75 mg total) by mouth daily.   TART CHERRY PO Take 1 tablet by mouth daily.   triamterene-hydrochlorothiazide (MAXZIDE-25) 37.5-25 MG tablet Take 0.5 tablets by mouth daily.   Turmeric 400 MG CAPS Take 3 capsules by mouth daily.   UNABLE  TO FIND Inject 2 mg into the skin once a week. Med Name: Ozempic 2 mg dose (8 mg/3 mL subcutaneous pen injector)   No facility-administered encounter medications on file as of 11/16/2021.   Reviewed chart for medication changes ahead of medication coordination call.  BP Readings from Last 3 Encounters:  11/13/21 105/64  10/23/21 96/62  09/20/21 (!) 93/58    Lab Results  Component Value Date   HGBA1C 5.8 (H) 10/23/2021     Patient obtains medications through  Adherence Packaging  90 Days   Last adherence delivery included:  Fish Oil 1200 mg Take two capsules every morning and take one capsule every evening Evening Primrose 1000 Take one capsule every morning Calcium Citrate 315 Take one tab every morning and take one tab every evening Glucosamine Sulf Dipotassium Cl 750 mg- Chondroitin Sulf 600 Mg Tab - Take one tab every morning and take one tab every evening Claritin 10 mg Tablet Take one tab every evening Tart Cherry 1200 mg Tab Take one tab every   Patient is due for next adherence delivery on: 11/28/2021. Called patient and reviewed medications and coordinated delivery.  This delivery to include: Fish Oil 1200 mg Take two capsules every morning and take one capsule every evening Evening Primrose 1000 Take one capsule every morning Calcium Citrate 315 Take one tab every morning and take one tab every evening Glucosamine Sulfate 500-400 Take one tab every morning and take one tab every evening Claritin 10 mg Tablet Take one tab every evening Tart Cherry 1200 mg Tab Take one tab every   Confirmed delivery date of 11/28/2021, advised patient that pharmacy will contact them the morning of delivery.   Care Gaps: Medicare Annual Wellness: Completed 07/17/2021 Hemoglobin A1C: 5.8% Colonoscopy: Completed 08/10/2048 Dexa Scan: Completed 05/28/2018 Mammogram: Completed 06/14/2021  Future Appointments  Date Time Provider [place]  11/19/2021  1:00 PM [facility] HEALTH COACH [facility]  12/04/2021  3:40 PM [name], DO [facility] None  12/10/2021  4:30 PM [facility] CCM PHARMACIST [facility]  01/01/2022  8:00 AM [name], DO [facility] None   Star Rating Drugs: Rosuvastatin 10 mg last filled 09/26/2021 90 DS Ozempic 2 mg last filled 10/26/2021 56 DS  April D Calhoun, [place] Pharmacist Assistant [phone]   10 minutes spent in review, coordination, and documentation.  Reviewed by: Beverly Milch, PharmD Clinical  Pharmacist [phone]

## 2021-11-19 ENCOUNTER — Ambulatory Visit (INDEPENDENT_AMBULATORY_CARE_PROVIDER_SITE_OTHER): Payer: PPO

## 2021-11-19 ENCOUNTER — Other Ambulatory Visit: Payer: Self-pay

## 2021-11-19 DIAGNOSIS — M9906 Segmental and somatic dysfunction of lower extremity: Secondary | ICD-10-CM | POA: Diagnosis not present

## 2021-11-19 DIAGNOSIS — M25512 Pain in left shoulder: Secondary | ICD-10-CM | POA: Diagnosis not present

## 2021-11-19 DIAGNOSIS — Z0000 Encounter for general adult medical examination without abnormal findings: Secondary | ICD-10-CM

## 2021-11-19 DIAGNOSIS — M25511 Pain in right shoulder: Secondary | ICD-10-CM | POA: Diagnosis not present

## 2021-11-19 DIAGNOSIS — M9901 Segmental and somatic dysfunction of cervical region: Secondary | ICD-10-CM | POA: Diagnosis not present

## 2021-11-19 DIAGNOSIS — M9907 Segmental and somatic dysfunction of upper extremity: Secondary | ICD-10-CM | POA: Diagnosis not present

## 2021-11-19 DIAGNOSIS — M79672 Pain in left foot: Secondary | ICD-10-CM | POA: Diagnosis not present

## 2021-11-19 DIAGNOSIS — G4486 Cervicogenic headache: Secondary | ICD-10-CM | POA: Diagnosis not present

## 2021-11-19 DIAGNOSIS — M542 Cervicalgia: Secondary | ICD-10-CM | POA: Diagnosis not present

## 2021-11-19 DIAGNOSIS — M25551 Pain in right hip: Secondary | ICD-10-CM | POA: Diagnosis not present

## 2021-11-19 NOTE — Patient Instructions (Signed)
Mary Hernandez , Thank you for taking time to come for your Medicare Wellness Visit. I appreciate your ongoing commitment to your health goals. Please review the following plan we discussed and let me know if I can assist you in the future.   Screening recommendations/referrals: Colonoscopy: Done 08/10/18 repeat every 5 years   Mammogram: Done 06/13/21 repeat every year  Bone Density: Done 05/28/18 repeat every 2 years  Recommended yearly ophthalmology/optometry visit for glaucoma screening and checkup Recommended yearly dental visit for hygiene and checkup  Vaccinations: Influenza vaccine: Done 07/17/21 repeat every year  Pneumococcal vaccine: Up to date Tdap vaccine: Done 06/18/17 repeat veery 10 years  Shingles vaccine: Completed 06/18/17, 11/06/17   Covid-19:Completed 2/7, 3/4, 07/07/20 & 02/01/21, 07/30/21  Advanced directives: Copies in chart   Conditions/risks identified: Lose weight   Next appointment: Follow up in one year for your annual wellness visit    Preventive Care 70 Years and Older, Female Preventive care refers to lifestyle choices and visits with your health care provider that can promote health and wellness. What does preventive care include? A yearly physical exam. This is also called an annual well check. Dental exams once or twice a year. Routine eye exams. Ask your health care provider how often you should have your eyes checked. Personal lifestyle choices, including: Daily care of your teeth and gums. Regular physical activity. Eating a healthy diet. Avoiding tobacco and drug use. Limiting alcohol use. Practicing safe sex. Taking low-dose aspirin every day. Taking vitamin and mineral supplements as recommended by your health care provider. What happens during an annual well check? The services and screenings done by your health care provider during your annual well check will depend on your age, overall health, lifestyle risk factors, and family history of  disease. Counseling  Your health care provider may ask you questions about your: Alcohol use. Tobacco use. Drug use. Emotional well-being. Home and relationship well-being. Sexual activity. Eating habits. History of falls. Memory and ability to understand (cognition). Work and work Statistician. Reproductive health. Screening  You may have the following tests or measurements: Height, weight, and BMI. Blood pressure. Lipid and cholesterol levels. These may be checked every 5 years, or more frequently if you are over 55 years old. Skin check. Lung cancer screening. You may have this screening every year starting at age 26 if you have a 30-pack-year history of smoking and currently smoke or have quit within the past 15 years. Fecal occult blood test (FOBT) of the stool. You may have this test every year starting at age 64. Flexible sigmoidoscopy or colonoscopy. You may have a sigmoidoscopy every 5 years or a colonoscopy every 10 years starting at age 49. Hepatitis C blood test. Hepatitis B blood test. Sexually transmitted disease (STD) testing. Diabetes screening. This is done by checking your blood sugar (glucose) after you have not eaten for a while (fasting). You may have this done every 1-3 years. Bone density scan. This is done to screen for osteoporosis. You may have this done starting at age 9. Mammogram. This may be done every 1-2 years. Talk to your health care provider about how often you should have regular mammograms. Talk with your health care provider about your test results, treatment options, and if necessary, the need for more tests. Vaccines  Your health care provider may recommend certain vaccines, such as: Influenza vaccine. This is recommended every year. Tetanus, diphtheria, and acellular pertussis (Tdap, Td) vaccine. You may need a Td booster every 10 years.  Zoster vaccine. You may need this after age 37. Pneumococcal 13-valent conjugate (PCV13) vaccine. One  dose is recommended after age 77. Pneumococcal polysaccharide (PPSV23) vaccine. One dose is recommended after age 22. Talk to your health care provider about which screenings and vaccines you need and how often you need them. This information is not intended to replace advice given to you by your health care provider. Make sure you discuss any questions you have with your health care provider. Document Released: 10/20/2015 Document Revised: 06/12/2016 Document Reviewed: 07/25/2015 Elsevier Interactive Patient Education  2017 [place] Prevention in the Home Falls can cause injuries. They can happen to people of all ages. There are many things you can do to make your home safe and to help prevent falls. What can I do on the outside of my home? Regularly fix the edges of walkways and driveways and fix any cracks. Remove anything that might make you trip as you walk through a door, such as a raised step or threshold. Trim any bushes or trees on the path to your home. Use bright outdoor lighting. Clear any walking paths of anything that might make someone trip, such as rocks or tools. Regularly check to see if handrails are loose or broken. Make sure that both sides of any steps have handrails. Any raised decks and porches should have guardrails on the edges. Have any leaves, snow, or ice cleared regularly. Use sand or salt on walking paths during winter. Clean up any spills in your garage right away. This includes oil or grease spills. What can I do in the bathroom? Use night lights. Install grab bars by the toilet and in the tub and shower. Do not use towel bars as grab bars. Use non-skid mats or decals in the tub or shower. If you need to sit down in the shower, use a plastic, non-slip stool. Keep the floor dry. Clean up any water that spills on the floor as soon as it happens. Remove soap buildup in the tub or shower regularly. Attach bath mats securely with double-sided  non-slip rug tape. Do not have throw rugs and other things on the floor that can make you trip. What can I do in the bedroom? Use night lights. Make sure that you have a light by your bed that is easy to reach. Do not use any sheets or blankets that are too big for your bed. They should not hang down onto the floor. Have a firm chair that has side arms. You can use this for support while you get dressed. Do not have throw rugs and other things on the floor that can make you trip. What can I do in the kitchen? Clean up any spills right away. Avoid walking on wet floors. Keep items that you use a lot in easy-to-reach places. If you need to reach something above you, use a strong step stool that has a grab bar. Keep electrical cords out of the way. Do not use floor polish or wax that makes floors slippery. If you must use wax, use non-skid floor wax. Do not have throw rugs and other things on the floor that can make you trip. What can I do with my stairs? Do not leave any items on the stairs. Make sure that there are handrails on both sides of the stairs and use them. Fix handrails that are broken or loose. Make sure that handrails are as long as the stairways. Check any carpeting to make sure that  it is firmly attached to the stairs. Fix any carpet that is loose or worn. Avoid having throw rugs at the top or bottom of the stairs. If you do have throw rugs, attach them to the floor with carpet tape. Make sure that you have a light switch at the top of the stairs and the bottom of the stairs. If you do not have them, ask someone to add them for you. What else can I do to help prevent falls? Wear shoes that: Do not have high heels. Have rubber bottoms. Are comfortable and fit you well. Are closed at the toe. Do not wear sandals. If you use a stepladder: Make sure that it is fully opened. Do not climb a closed stepladder. Make sure that both sides of the stepladder are locked into place. Ask  someone to hold it for you, if possible. Clearly mark and make sure that you can see: Any grab bars or handrails. First and last steps. Where the edge of each step is. Use tools that help you move around (mobility aids) if they are needed. These include: Canes. Walkers. Scooters. Crutches. Turn on the lights when you go into a dark area. Replace any light bulbs as soon as they burn out. Set up your furniture so you have a clear path. Avoid moving your furniture around. If any of your floors are uneven, fix them. If there are any pets around you, be aware of where they are. Review your medicines with your doctor. Some medicines can make you feel dizzy. This can increase your chance of falling. Ask your doctor what other things that you can do to help prevent falls. This information is not intended to replace advice given to you by your health care provider. Make sure you discuss any questions you have with your health care provider. Document Released: 07/20/2009 Document Revised: 02/29/2016 Document Reviewed: 10/28/2014 Elsevier Interactive Patient Education  2017 [employer].

## 2021-11-19 NOTE — Progress Notes (Signed)
Virtual Visit via Telephone Note  I connected with  Mary Hernandez on 11/19/21 at  1:00 PM EST by telephone and verified that I am speaking with the correct person using two identifiers.  Medicare Annual Wellness visit completed telephonically due to Covid-19 pandemic.   Persons participating in this call: This Health Coach and this patient.   Location: Patient: Home Provider: Office   I discussed the limitations, risks, security and privacy concerns of performing an evaluation and management service by telephone and the availability of in person appointments. The patient expressed understanding and agreed to proceed.  Unable to perform video visit due to video visit attempted and failed and/or patient does not have video capability.   Some vital signs may be absent or patient reported.   Willette Brace, LPN   Subjective:   Mary Hernandez is a 71 y.o. female who presents for Medicare Annual (Subsequent) preventive examination.  Review of Systems     Cardiac Risk Factors include: advanced age (>62men, >21 women);dyslipidemia     Objective:    There were no vitals filed for this visit. There is no height or weight on file to calculate BMI.  Advanced Directives 11/19/2021 11/13/2020 10/07/2019 11/15/2018  Does Patient Have a Medical Advance Directive? Yes Yes Yes No  Type of Advance Directive Healthcare Power of [place] -  Does patient want to make changes to medical advance directive? - - No - Patient declined -  Copy of [place] in Chart? Yes - validated most recent copy scanned in chart (See row information) Yes - validated most recent copy scanned in chart (See row information) Yes - validated most recent copy scanned in chart (See row information) -  Would patient like information on creating a medical advance directive? - - - No - Patient declined    Current Medications  (verified) Outpatient Encounter Medications as of 11/19/2021  Medication Sig   acebutolol (SECTRAL) 200 MG capsule Take 1 capsule (200 mg total) by mouth daily.   Calcium Citrate-Vitamin D 315-250 MG-UNIT TABS Take by mouth.   cetirizine (ZYRTEC) 10 MG tablet Take 10 mg by mouth daily.   diclofenac Sodium (VOLTAREN) 1 % GEL Apply 2 g topically 4 (four) times daily.   EVENING PRIMROSE OIL PO Take 1,500 mg by mouth 2 (two) times daily.   flecainide (TAMBOCOR) 50 MG tablet Take 1.5 tablets (75 mg total) by mouth 2 (two) times daily.   fluticasone (FLONASE) 50 MCG/ACT nasal spray Place 1 spray into both nostrils daily.   Glucosamine-Chondroit-Vit C-Mn (GLUCOSAMINE 1500 COMPLEX) CAPS Take by mouth.   glycopyrrolate (ROBINUL) 1 MG tablet Take 1 tablet (1 mg total) by mouth 2 (two) times daily.   levothyroxine (SYNTHROID) 100 MCG tablet Take 1 tablet (100 mcg total) by mouth daily.   MAGNESIUM PO Take by mouth.   naltrexone (DEPADE) 50 MG tablet Take 50 mg by mouth in the morning and at bedtime.   omega-3 acid ethyl esters (LOVAZA) 1 g capsule Take 1 g by mouth 2 (two) times daily.   ondansetron (ZOFRAN-ODT) 4 MG disintegrating tablet Take 4 mg by mouth every 8 (eight) hours as needed for nausea or vomiting.   Potassium Chloride ER 20 MEQ TBCR Take 20 mEq by mouth daily. Please keep upcoming appt in January 2023 with Dr. Caryl Comes before anymore refills. Thank you   pregabalin (LYRICA) 75 MG capsule Take 75 mg by mouth 2 (two)  times daily.   Probiotic Product (PROBIOTIC-10 PO) Take 1 capsule by mouth daily.   rosuvastatin (CRESTOR) 10 MG tablet Take 1 tablet (10 mg total) by mouth daily.   Semaglutide, 2 MG/DOSE, (OZEMPIC, 2 MG/DOSE,) 8 MG/3ML SOPN Inject 2 mg into the skin once a week.   sertraline (ZOLOFT) 50 MG tablet Take 1.5 tablets (75 mg total) by mouth daily.   TART CHERRY PO Take 1 tablet by mouth daily.   triamterene-hydrochlorothiazide (MAXZIDE-25) 37.5-25 MG tablet Take 0.5 tablets by mouth  daily.   Turmeric 400 MG CAPS Take 3 capsules by mouth daily.   UNABLE TO FIND Inject 2 mg into the skin once a week. Med Name: Ozempic 2 mg dose (8 mg/3 mL subcutaneous pen injector)   No facility-administered encounter medications on file as of 11/19/2021.    Allergies (verified) Seasonal ic [cholestatin] and Adhesive [tape]   History: Past Medical History:  Diagnosis Date   Acquired hypothyroidism 03/01/2018   hx of goiter; s/p thyroidectomy   Anxiety    Atrial premature depolarization    BPPV (benign paroxysmal positional vertigo) 03/01/2018   Colon polyps    Essential hypertension 03/01/2018   denies hx - on diuretic for Meniere's and beta blocker for PVCs   Frequent PVCs 03/01/2018   Controlled on Flecainide, Acebutolol   Gallbladder problem    GERD (gastroesophageal reflux disease)    Hip pain    History of cardiac catheterization    Nuc 7/16:  normal perfusion; EF 76 // LHC 8/16:  normal coronary arteries   History of depression    History of dizziness    History of echocardiogram    Echo 03/2006:  Normal LVEF   History of stomach ulcers    HLD (hyperlipidemia)    Hypothyroidism    IBS (irritable bowel syndrome)    Infertility, female    Insulin resistance    Lower back pain    Meniere's disease    Notalgia paresthetica    Prediabetes    PVC (premature ventricular contraction)    Vertigo    Vitamin D deficiency    Past Surgical History:  Procedure Laterality Date   BLADDER SURGERY  1997   BREAST BIOPSY  2013   [place]  05/2015   Carpel Tunnel Release     CHOLECYSTECTOMY  2011   COLONOSCOPY  08/20/2015   ESOPHAGOGASTRODUODENOSCOPY  01/13/2017   FOOT NEUROMA SURGERY  1986   REDUCTION MAMMAPLASTY     REFRACTIVE SURGERY     S/P Eye Right 1967   Muscle Clipped    THYROIDECTOMY  2012   TONSILLECTOMY  1975   TOTAL ABDOMINAL HYSTERECTOMY  1996   Family History  Problem Relation Age of Onset   Breast  cancer Mother    Hyperlipidemia Mother    Thyroid disease Mother    Cancer Mother    Heart attack Father    Pulmonary fibrosis Father    Peripheral Artery Disease Father        s/p CEA   Heart disease Father    Hyperlipidemia Father    Early death Sister    Depression Brother    Hearing loss Brother    Diabetes Daughter    Heart disease Paternal Grandmother    Colon cancer Neg Hx    Stomach cancer Neg Hx    Social History   Socioeconomic History   Marital status: Married    Spouse name: Not on file  Number of children: 2   Years of education: Not on file   Highest education level: Not on file  Occupational History   Occupation: Retired Educational psychologist  Tobacco Use   Smoking status: Never   Smokeless tobacco: Never  Vaping Use   Vaping Use: Never used  Substance and Sexual Activity   Alcohol use: Yes    Comment: very limited    Drug use: Never   Sexual activity: Not on file  Other Topics Concern   Not on file  Social History Narrative   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   Moved to [place] in 2019   Twin daughters (one daughter is diabetic Tourist information centre manager)    Social Determinants of Radio broadcast assistant Strain: Low Risk    Difficulty of Paying Living Expenses: Not hard at all  Food Insecurity: No Food Insecurity   Worried About Charity fundraiser in the Last Year: Never true   Arboriculturist in the Last Year: Never true  Transportation Needs: No Transportation Needs   Lack of Transportation (Medical): No   Lack of Transportation (Non-Medical): No  Physical Activity: Sufficiently Active   Days of Exercise per Week: 5 days   Minutes of Exercise per Session: 60 min  Stress: Stress Concern Present   Feeling of Stress : To some extent  Social Connections: Engineer, building services of Communication with Friends and Family: More than three times a week   Frequency of  Social Gatherings with Friends and Family: More than three times a week   Attends Religious Services: More than 4 times per year   Active Member of [employer] or Organizations: Yes   Attends Archivist Meetings: 1 to 4 times per year   Marital Status: Married    Tobacco Counseling Counseling given: Not Answered   Clinical Intake:  Pre-visit preparation completed: Yes  Pain : No/denies pain     BMI - recorded: 28.29 Nutritional Status: BMI 25 -29 Overweight Nutritional Risks: None Diabetes: No  How often do you need to have someone help you when you read instructions, pamphlets, or other written materials from your doctor or pharmacy?: 1 - Never  Diabetic?no  Interpreter Needed?: No  Information entered by :: Charlott Rakes, LPN   Activities of Daily Living In your present state of health, do you have any difficulty performing the following activities: 11/19/2021  Hearing? N  Vision? N  Difficulty concentrating or making decisions? N  Walking or climbing stairs? N  Dressing or bathing? N  Doing errands, shopping? N  Preparing Food and eating ? N  Using the Toilet? N  In the past six months, have you accidently leaked urine? N  Do you have problems with loss of bowel control? N  Managing your Medications? N  Managing your Finances? N  Housekeeping or managing your Housekeeping? N  Some recent data might be hidden    Patient Care Team: Leamon Arnt, MD as PCP - General (Family Medicine) Deboraha Sprang, MD as PCP - Electrophysiology (Cardiology) Gerda Diss, DO as Consulting Physician (Sports Medicine) Debbra Riding, MD as Consulting Physician (Ophthalmology) Deboraha Sprang, MD as Consulting Physician (Cardiology) Magnus Sinning, MD as Consulting Physician (Physical Medicine and Rehabilitation) Edythe Clarity, [facility] (Pharmacist)  Indicate any recent Medical Services you may have received from other than [facility] providers  in the past year  (date may be approximate).     Assessment:   This is a routine wellness examination for [facility].  Hearing/Vision screen Hearing Screening - Comments:: Pt denies any hearing issues  Vision Screening - Comments:: Pt follows up with [facility] Wyatt Portela for annual eye exams  Dietary issues and exercise activities discussed: Current Exercise Habits: Home exercise routine;Structured exercise class, Type of exercise: Other - see comments;walking (pilates), Time (Minutes): 60, Frequency (Times/Week): 5, Weekly Exercise (Minutes/Week): 300   Goals Addressed             This Visit's Progress    Patient Stated       Lose weight        Depression Screen PHQ 2/9 Scores 11/19/2021 09/03/2021 11/13/2020 07/03/2020 10/07/2019 05/19/2019 10/12/2018  PHQ - 2 Score 0 0 0 3 0 0 0  PHQ- 9 Score - - - 9 - 3 -    Fall Risk Fall Risk  11/19/2021 09/03/2021 11/13/2020 10/07/2019 12/30/2018  Falls in the past year? 0 0 1 0 0  Number falls in past yr: 0 - 1 0 0  Injury with Fall? 0 - 1 - 0  Comment - - bruised - -  Risk for fall due to : Impaired vision - - - -  Follow up Falls prevention discussed - - - -    FALL RISK PREVENTION PERTAINING TO THE HOME:  Any stairs in or around the home? Yes  If so, are there any without handrails? No  Home free of loose throw rugs in walkways, pet beds, electrical cords, etc? Yes  Adequate lighting in your home to reduce risk of falls? Yes   ASSISTIVE DEVICES UTILIZED TO PREVENT FALLS:  Life alert? No  Use of a cane, walker or w/c? No  Grab bars in the bathroom? Yes  Shower chair or bench in shower? Yes  Elevated toilet seat or a handicapped toilet? No   TIMED UP AND GO:  Was the test performed? No .  Cognitive Function:     6CIT Screen 11/19/2021 11/13/2020  What Year? 0 points 0 points  What month? 0 points 0 points  What time? 0 points -  Count back from 20 0 points 0 points  Months in reverse 0 points 0 points  Repeat phrase 0 points 0 points  Total Score  0 -    Immunizations Immunization History  Administered Date(s) Administered   Fluad Quad(high Dose 65+) 06/08/2019, 06/21/2020, 07/17/2021   Hepatitis A 06/18/2004   Hepatitis B 06/18/2004   Influenza, High Dose Seasonal PF 06/16/2018   Moderna Covid-19 Vaccine Bivalent Booster 59yrs & up 02/01/2021   PFIZER(Purple Top)SARS-COV-2 Vaccination 11/14/2019, 12/09/2019, 07/07/2020   Pfizer Covid-19 Vaccine Bivalent Booster 96yrs & up 07/30/2021   Pneumococcal Conjugate-13 09/14/2015   Pneumococcal Polysaccharide-23 06/10/2017   Tdap 06/18/2017   Zoster Recombinat (Shingrix) 06/18/2017, 11/06/2017   Zoster, Live 08/22/2010    TDAP status: Up to date  Flu Vaccine status: Up to date  Pneumococcal vaccine status: Up to date  Covid-19 vaccine status: Completed vaccines  Qualifies for Shingles Vaccine? Yes   Zostavax completed Yes   Shingrix Completed?: Yes  Screening Tests Health Maintenance  Topic Date Due   Pneumonia Vaccine 53+ Years old (58) 06/10/2022   MAMMOGRAM  06/13/2022   DEXA SCAN  05/29/2023   COLONOSCOPY (Pts 45-99yrs Insurance coverage will need to be confirmed)  08/11/2023   TETANUS/TDAP  06/19/2027   INFLUENZA VACCINE  Completed  COVID-19 Vaccine  Completed   Hepatitis C Screening  Completed   Zoster Vaccines- Shingrix  Completed   HPV VACCINES  Aged Out    Health Maintenance  There are no preventive care reminders to display for this patient.  Colorectal cancer screening: Type of screening: Colonoscopy. Completed 08/10/18. Repeat every 5 years  Mammogram status: Completed 06/13/21. Repeat every year  Bone Density status: Completed 05/28/18. Results reflect: Bone density results: NORMAL. Repeat every 2 years.   Additional Screening:  Hepatitis C Screening: Completed 05/01/18  Vision Screening: Recommended annual ophthalmology exams for early detection of glaucoma and other disorders of the eye. Is the patient up to date with their annual eye exam?  Yes   Who is the provider or what is the name of the office in which the patient attends annual eye exams? Dr Wyatt Portela  If pt is not established with a provider, would they like to be referred to a provider to establish care? No .   Dental Screening: Recommended annual dental exams for proper oral hygiene  Community Resource Referral / Chronic Care Management: CRR required this visit?  No   CCM required this visit?  No      Plan:     I have personally reviewed and noted the following in the patients chart:   Medical and social history Use of alcohol, tobacco or illicit drugs  Current medications and supplements including opioid prescriptions.  Functional ability and status Nutritional status Physical activity Advanced directives List of other physicians Hospitalizations, surgeries, and ER visits in previous 12 months Vitals Screenings to include cognitive, depression, and falls Referrals and appointments  In addition, I have reviewed and discussed with patient certain preventive protocols, quality metrics, and best practice recommendations. A written personalized care plan for preventive services as well as general preventive health recommendations were provided to patient.     Willette Brace, LPN   5/68/1275   Nurse Notes: Pt is requesting information if any of her medications are causing her to have excessive phlegm? She stated it has been an ongoing issue and she is concerned please advise.

## 2021-11-22 ENCOUNTER — Encounter: Payer: Self-pay | Admitting: Internal Medicine

## 2021-11-22 ENCOUNTER — Other Ambulatory Visit: Payer: Self-pay | Admitting: Internal Medicine

## 2021-11-22 ENCOUNTER — Encounter (INDEPENDENT_AMBULATORY_CARE_PROVIDER_SITE_OTHER): Payer: Self-pay | Admitting: Family Medicine

## 2021-11-22 DIAGNOSIS — F4323 Adjustment disorder with mixed anxiety and depressed mood: Secondary | ICD-10-CM

## 2021-11-22 MED ORDER — FLECAINIDE ACETATE 50 MG PO TABS: 75.0000 mg | ORAL_TABLET | Freq: Two times a day (BID) | ORAL | 1 refills | Status: DC

## 2021-11-22 NOTE — Telephone Encounter (Signed)
LOV w/ Wallace

## 2021-11-26 MED ORDER — SERTRALINE HCL 50 MG PO TABS: 100.0000 mg | ORAL_TABLET | Freq: Every day | ORAL | 3 refills | Status: AC

## 2021-12-03 DIAGNOSIS — M9904 Segmental and somatic dysfunction of sacral region: Secondary | ICD-10-CM | POA: Diagnosis not present

## 2021-12-03 DIAGNOSIS — M9907 Segmental and somatic dysfunction of upper extremity: Secondary | ICD-10-CM | POA: Diagnosis not present

## 2021-12-03 DIAGNOSIS — M542 Cervicalgia: Secondary | ICD-10-CM | POA: Diagnosis not present

## 2021-12-03 DIAGNOSIS — M9905 Segmental and somatic dysfunction of pelvic region: Secondary | ICD-10-CM | POA: Diagnosis not present

## 2021-12-03 DIAGNOSIS — M9900 Segmental and somatic dysfunction of head region: Secondary | ICD-10-CM | POA: Diagnosis not present

## 2021-12-03 DIAGNOSIS — M9901 Segmental and somatic dysfunction of cervical region: Secondary | ICD-10-CM | POA: Diagnosis not present

## 2021-12-03 DIAGNOSIS — M25541 Pain in joints of right hand: Secondary | ICD-10-CM | POA: Diagnosis not present

## 2021-12-04 ENCOUNTER — Ambulatory Visit (INDEPENDENT_AMBULATORY_CARE_PROVIDER_SITE_OTHER): Payer: PPO | Admitting: Family Medicine

## 2021-12-04 ENCOUNTER — Encounter (INDEPENDENT_AMBULATORY_CARE_PROVIDER_SITE_OTHER): Payer: Self-pay | Admitting: Family Medicine

## 2021-12-04 ENCOUNTER — Other Ambulatory Visit: Payer: Self-pay

## 2021-12-04 VITALS — BP 101/59 | HR 63 | Temp 98.1°F | Ht 65.0 in | Wt 171.0 lb

## 2021-12-04 DIAGNOSIS — E669 Obesity, unspecified: Secondary | ICD-10-CM

## 2021-12-04 DIAGNOSIS — M797 Fibromyalgia: Secondary | ICD-10-CM | POA: Diagnosis not present

## 2021-12-04 DIAGNOSIS — R7303 Prediabetes: Secondary | ICD-10-CM | POA: Diagnosis not present

## 2021-12-04 DIAGNOSIS — F4323 Adjustment disorder with mixed anxiety and depressed mood: Secondary | ICD-10-CM | POA: Diagnosis not present

## 2021-12-04 DIAGNOSIS — Z6831 Body mass index (BMI) 31.0-31.9, adult: Secondary | ICD-10-CM

## 2021-12-04 DIAGNOSIS — Z6828 Body mass index (BMI) 28.0-28.9, adult: Secondary | ICD-10-CM | POA: Diagnosis not present

## 2021-12-05 ENCOUNTER — Ambulatory Visit (INDEPENDENT_AMBULATORY_CARE_PROVIDER_SITE_OTHER): Payer: PPO | Admitting: Family Medicine

## 2021-12-10 ENCOUNTER — Telehealth: Payer: PPO

## 2021-12-10 NOTE — Progress Notes (Signed)
Chief Complaint:   OBESITY Mary Hernandez is here to discuss her progress with her obesity treatment plan along with follow-up of her obesity related diagnoses.   Today's visit was #: 22 Starting weight: 192 lbs Starting date: 07/03/2020 Today's weight: 171 lbs Today's date: 12/04/2021 Weight change since last visit: + 1 lb Total lbs lost to date: 21 lbs Body mass index is 28.46 kg/m.  Total weight loss percentage to date: -10.94%  Current Meal Plan: the Category 2 Plan for 70% of the time.  Current Exercise Plan: [employer], pilates, walking, and strength training for 30-60 minutes 70 times per week. Current Anti-Obesity Medications: Ozempic 2 MG subcutaneously weekly. Side effects: None.  Interim History: Mary Hernandez is leaving tomorrow to help her daughter for 3 weeks. She has increased her exercise again.   Assessment/Plan:   1. Prediabetes, with polyphagia Not at goal. Goal is HgbA1c < 5.7.  Medication: Ozempic 2 MG subcutaneously weekly.    Plan:  She will continue to focus on protein-rich, low simple carbohydrate foods. We reviewed the importance of hydration, regular exercise for stress reduction, and restorative sleep. Continue Ozempic 2 MG subcutaneously weekly.  Lab Results  Component Value Date   HGBA1C 5.8 (H) 10/23/2021   Lab Results  Component Value Date   INSULIN 17.4 06/14/2021   INSULIN 27.5 (H) 10/12/2020   INSULIN 21.9 07/03/2020   2. Fibromyalgia The current medical regimen is effective; continue present plan and medications. We will continue to monitor symptoms as they relate to her weight loss journey.  3. Situational mixed anxiety and depressive disorder Mary Hernandez is taking Zoloft 75 mg daily.   Plan:  The current medical regimen is effective;  continue present plan and medications. Behavior modification techniques were discussed today to help Mary Hernandez deal with her anxiety and depression.  4. Obesity, current BMI 28.6 Course: Mary Hernandez is currently in the action stage  of change. As such, her goal is to continue with weight loss efforts.   Nutrition goals: She has agreed to the Category 2 Plan.   Exercise goals: As is.  Behavioral modification strategies: increasing lean protein intake, decreasing simple carbohydrates, increasing vegetables, and increasing water intake.  Mary Hernandez has agreed to follow-up with our clinic in 4 weeks. She was informed of the importance of frequent follow-up visits to maximize her success with intensive lifestyle modifications for her multiple health conditions.   Objective:   Blood pressure (!) 101/59, pulse 63, temperature 98.1 F (36.7 C), temperature source Oral, height '5\' 5"'$  (1.651 m), weight 171 lb (77.6 kg), SpO2 96 %. Body mass index is 28.46 kg/m.  General: Cooperative, alert, well developed, in no acute distress. HEENT: Conjunctivae and lids unremarkable. Cardiovascular: Regular rhythm.  Lungs: Normal work of breathing. Neurologic: No focal deficits.   Lab Results  Component Value Date   CREATININE 0.65 10/23/2021   BUN 14 10/23/2021   NA 139 10/23/2021   K 4.2 10/23/2021   CL 99 10/23/2021   CO2 24 10/23/2021   Lab Results  Component Value Date   ALT 29 09/05/2021   AST 26 09/05/2021   ALKPHOS 77 09/05/2021   BILITOT 0.3 09/05/2021   Lab Results  Component Value Date   HGBA1C 5.8 (H) 10/23/2021   HGBA1C 5.9 (H) 06/14/2021   HGBA1C 6.0 (H) 01/29/2021   HGBA1C 5.9 (H) 10/12/2020   HGBA1C 6.2 (H) 06/21/2020   Lab Results  Component Value Date   INSULIN 17.4 06/14/2021   INSULIN 27.5 (H) 10/12/2020  INSULIN 21.9 07/03/2020   Lab Results  Component Value Date   TSH 0.321 (L) 09/05/2021   Lab Results  Component Value Date   CHOL 185 06/14/2021   HDL 63 06/14/2021   LDLCALC 102 (H) 06/14/2021   LDLDIRECT 94.0 05/01/2018   TRIG 112 06/14/2021   CHOLHDL 2.9 06/14/2021   Lab Results  Component Value Date   VD25OH 49.7 09/05/2021   VD25OH 46.6 06/14/2021   VD25OH 52.3 07/03/2020    Lab Results  Component Value Date   WBC 6.2 09/05/2021   HGB 13.2 09/05/2021   HCT 40.5 09/05/2021   MCV 92 09/05/2021   PLT 186 09/05/2021   Attestation Statements:   Reviewed by clinician on day of visit: allergies, medications, problem list, medical history, surgical history, family history, social history, and previous encounter notes.  Leodis Binet Friedenbach, CMA, am acting as Location manager for [employer], DO.  I have reviewed the above documentation for accuracy and completeness, and I agree with the above. -  [name], DO, MS, FAAFP, DABOM - Family and Bariatric Medicine.

## 2022-01-01 ENCOUNTER — Encounter (INDEPENDENT_AMBULATORY_CARE_PROVIDER_SITE_OTHER): Payer: Self-pay | Admitting: Family Medicine

## 2022-01-01 ENCOUNTER — Other Ambulatory Visit: Payer: Self-pay

## 2022-01-01 ENCOUNTER — Ambulatory Visit (INDEPENDENT_AMBULATORY_CARE_PROVIDER_SITE_OTHER): Payer: PPO | Admitting: Family Medicine

## 2022-01-01 VITALS — BP 93/60 | HR 63 | Temp 97.8°F | Ht 65.0 in | Wt 166.0 lb

## 2022-01-01 DIAGNOSIS — E782 Mixed hyperlipidemia: Secondary | ICD-10-CM

## 2022-01-01 DIAGNOSIS — R7303 Prediabetes: Secondary | ICD-10-CM | POA: Diagnosis not present

## 2022-01-01 DIAGNOSIS — E039 Hypothyroidism, unspecified: Secondary | ICD-10-CM

## 2022-01-01 DIAGNOSIS — M9903 Segmental and somatic dysfunction of lumbar region: Secondary | ICD-10-CM | POA: Diagnosis not present

## 2022-01-01 DIAGNOSIS — Z6827 Body mass index (BMI) 27.0-27.9, adult: Secondary | ICD-10-CM

## 2022-01-01 DIAGNOSIS — E669 Obesity, unspecified: Secondary | ICD-10-CM | POA: Diagnosis not present

## 2022-01-01 DIAGNOSIS — M9901 Segmental and somatic dysfunction of cervical region: Secondary | ICD-10-CM | POA: Diagnosis not present

## 2022-01-01 DIAGNOSIS — E559 Vitamin D deficiency, unspecified: Secondary | ICD-10-CM | POA: Diagnosis not present

## 2022-01-01 DIAGNOSIS — M9904 Segmental and somatic dysfunction of sacral region: Secondary | ICD-10-CM | POA: Diagnosis not present

## 2022-01-01 DIAGNOSIS — J302 Other seasonal allergic rhinitis: Secondary | ICD-10-CM | POA: Diagnosis not present

## 2022-01-01 DIAGNOSIS — M542 Cervicalgia: Secondary | ICD-10-CM | POA: Diagnosis not present

## 2022-01-01 DIAGNOSIS — M25562 Pain in left knee: Secondary | ICD-10-CM | POA: Diagnosis not present

## 2022-01-01 DIAGNOSIS — M25552 Pain in left hip: Secondary | ICD-10-CM | POA: Diagnosis not present

## 2022-01-01 DIAGNOSIS — M25551 Pain in right hip: Secondary | ICD-10-CM | POA: Diagnosis not present

## 2022-01-01 DIAGNOSIS — G4486 Cervicogenic headache: Secondary | ICD-10-CM | POA: Diagnosis not present

## 2022-01-01 DIAGNOSIS — M79672 Pain in left foot: Secondary | ICD-10-CM | POA: Diagnosis not present

## 2022-01-01 DIAGNOSIS — M545 Low back pain, unspecified: Secondary | ICD-10-CM | POA: Diagnosis not present

## 2022-01-01 DIAGNOSIS — M9902 Segmental and somatic dysfunction of thoracic region: Secondary | ICD-10-CM | POA: Diagnosis not present

## 2022-01-01 DIAGNOSIS — F430 Acute stress reaction: Secondary | ICD-10-CM | POA: Diagnosis not present

## 2022-01-01 MED ORDER — MONTELUKAST SODIUM 10 MG PO TABS: 10.0000 mg | ORAL_TABLET | Freq: Every day | ORAL | 0 refills | Status: DC

## 2022-01-02 ENCOUNTER — Encounter (INDEPENDENT_AMBULATORY_CARE_PROVIDER_SITE_OTHER): Payer: Self-pay | Admitting: Family Medicine

## 2022-01-02 LAB — LIPID PANEL
Chol/HDL Ratio: 2.5 ratio (ref 0.0–4.4)
Cholesterol, Total: 137 mg/dL (ref 100–199)
HDL: 55 mg/dL (ref >39)
LDL Chol Calc (NIH): 61 mg/dL (ref 0–99)
Triglycerides: 119 mg/dL (ref 0–149)
VLDL Cholesterol Cal: 21 mg/dL (ref 5–40)

## 2022-01-02 LAB — CBC WITH DIFFERENTIAL/PLATELET
Basophils Absolute: 0 10*3/uL (ref 0.0–0.2)
Basos: 1 %
EOS (ABSOLUTE): 0.2 10*3/uL (ref 0.0–0.4)
Eos: 3 %
Hematocrit: 42.7 % (ref 34.0–46.6)
Hemoglobin: 13.9 g/dL (ref 11.1–15.9)
Immature Grans (Abs): 0 10*3/uL (ref 0.0–0.1)
Immature Granulocytes: 0 %
Lymphocytes Absolute: 2.2 10*3/uL (ref 0.7–3.1)
Lymphs: 36 %
MCH: 29.6 pg (ref 26.6–33.0)
MCHC: 32.6 g/dL (ref 31.5–35.7)
MCV: 91 fL (ref 79–97)
Monocytes Absolute: 0.4 10*3/uL (ref 0.1–0.9)
Monocytes: 6 %
Neutrophils Absolute: 3.3 10*3/uL (ref 1.4–7.0)
Neutrophils: 54 %
Platelets: 200 10*3/uL (ref 150–450)
RBC: 4.7 x10E6/uL (ref 3.77–5.28)
RDW: 13 % (ref 11.7–15.4)
WBC: 6.1 10*3/uL (ref 3.4–10.8)

## 2022-01-02 LAB — COMPREHENSIVE METABOLIC PANEL
ALT: 15 IU/L (ref 0–32)
AST: 18 IU/L (ref 0–40)
Albumin/Globulin Ratio: 2.1 (ref 1.2–2.2)
Albumin: 4.6 g/dL (ref 3.8–4.8)
Alkaline Phosphatase: 61 IU/L (ref 44–121)
BUN/Creatinine Ratio: 27 (ref 12–28)
BUN: 17 mg/dL (ref 8–27)
Bilirubin Total: 0.3 mg/dL (ref 0.0–1.2)
CO2: 23 mmol/L (ref 20–29)
Calcium: 8.5 mg/dL — ABNORMAL LOW (ref 8.7–10.3)
Chloride: 103 mmol/L (ref 96–106)
Creatinine, Ser: 0.62 mg/dL (ref 0.57–1.00)
Globulin, Total: 2.2 g/dL (ref 1.5–4.5)
Glucose: 96 mg/dL (ref 70–99)
Potassium: 3.8 mmol/L (ref 3.5–5.2)
Sodium: 141 mmol/L (ref 134–144)
Total Protein: 6.8 g/dL (ref 6.0–8.5)
eGFR: 96 mL/min/{1.73_m2} (ref >59)

## 2022-01-02 LAB — VITAMIN B12: Vitamin B-12: 547 pg/mL (ref 232–1245)

## 2022-01-02 LAB — T4, FREE: Free T4: 1.49 ng/dL (ref 0.82–1.77)

## 2022-01-02 LAB — VITAMIN D 25 HYDROXY (VIT D DEFICIENCY, FRACTURES): Vit D, 25-Hydroxy: 43.4 ng/mL (ref 30.0–100.0)

## 2022-01-02 LAB — FRUCTOSAMINE: Fructosamine: 220 umol/L (ref 0–285)

## 2022-01-02 LAB — TSH: TSH: 0.913 u[IU]/mL (ref 0.450–4.500)

## 2022-01-02 NOTE — Telephone Encounter (Signed)
Dr.Wallace °

## 2022-01-03 NOTE — Progress Notes (Signed)
Chief Complaint:   OBESITY Mary Hernandez is here to discuss her progress with her obesity treatment plan along with follow-up of her obesity related diagnoses.   Today's visit was #: 23 Starting weight: 192 lbs Starting date: 07/03/2020 Today's weight: 166 lbs Today's date: 01/02/2020 Weight change since last visit: -5 lbs Total lbs lost to date: 26 lbs Body mass index is 27.62 kg/m.  Total weight loss percentage to date: -13.54%  Current Meal Plan: the Category 2 Plan for 75% of the time.  Current Exercise Plan: Increased activity. Current Anti-Obesity Medications: Ozempic 2 MG subcutaneously weekly. Side effects: None.  Interim History: Leonda reports that she steak and has been experiencing nausea/vomiting/diarrhea for 1 day. She also reports that she has been clearing her throat again since being back- was better while in [place].  Assessment/Plan:   1. Seasonal allergies Start Singulair, as per below.  - montelukast (SINGULAIR) 10 MG tablet; Take 1 tablet (10 mg total) by mouth at bedtime.  Dispense: 30 tablet; Refill: 0  2. Prediabetes Not at goal. Goal is HgbA1c < 5.7.  Medication: Ozempic 2 mg subcutaneously weekly.    Plan: Will get labs today. She will continue to focus on protein-rich, low simple carbohydrate foods. We reviewed the importance of hydration, regular exercise for stress reduction, and restorative sleep.   Lab Results  Component Value Date   HGBA1C 5.8 (H) 10/23/2021   Lab Results  Component Value Date   INSULIN 17.4 06/14/2021   INSULIN 27.5 (H) 10/12/2020   INSULIN 21.9 07/03/2020   - CBC with Differential/Platelet - Vitamin B12 - Fructosamine  3. Vitamin D deficiency Improving, but not optimized.   Plan: Will check labs today. Continue current OTC vitamin D supplementation.  Follow-up for routine testing of Vitamin D, at least 2-3 times per year to avoid over-replacement.  Lab Results  Component Value Date   VD25OH 49.7 09/05/2021    VD25OH 46.6 06/14/2021   - VITAMIN D 25 Hydroxy (Vit-D Deficiency, Fractures)  4. Acquired hypothyroidism Medication: Synthroid 100 mcg daily.   Plan: We will check labs today. Patient was instructed not to take MVM or iron within 4 hours of taking thyroid medications. This issue is managed by Dr. Jonni Sanger. We will continue to monitor alongside Endocrinology/PCP as it relates to her weight loss journey.   Lab Results  Component Value Date   TSH 0.913 01/01/2022   - TSH - T4, free  5. Mixed hyperlipidemia Lipid-lowering medications: Lovaza 1 g twice daily, Crestor 10 mg daily.   Plan: We will check labs today. Dietary changes: Increase soluble fiber, decrease simple carbohydrates, decrease saturated fat. Exercise changes: Moderate to vigorous-intensity aerobic activity 150 minutes per week or as tolerated. We will continue to monitor along with PCP/specialists as it pertains to her weight loss journey.  - Comprehensive metabolic panel - Lipid panel  6. Obesity, current BMI 27.6 Course: Falicia is currently in the action stage of change. As such, her goal is to continue with weight loss efforts.   Nutrition goals: She has agreed to the Category 2 Plan.   Exercise goals: As is.  Behavioral modification strategies: increasing lean protein intake, decreasing simple carbohydrates, and increasing vegetables.  Indra has agreed to follow-up with our clinic in 6 weeks. She was informed of the importance of frequent follow-up visits to maximize her success with intensive lifestyle modifications for her multiple health conditions.   Shell was informed we would discuss her lab results at her next visit  unless there is a critical issue that needs to be addressed sooner. Zayana agreed to keep her next visit at the agreed upon time to discuss these results.  Objective:   Blood pressure 93/60, pulse 63, temperature 97.8 F (36.6 C), temperature source Oral, height '5\' 5"'$  (1.651 m), weight 166 lb (75.3  kg), SpO2 97 %. Body mass index is 27.62 kg/m.  General: Cooperative, alert, well developed, in no acute distress. HEENT: Conjunctivae and lids unremarkable. Cardiovascular: Regular rhythm.  Lungs: Normal work of breathing. Neurologic: No focal deficits.   Lab Results  Component Value Date   HGBA1C 5.8 (H) 10/23/2021   HGBA1C 5.9 (H) 06/14/2021   HGBA1C 6.0 (H) 01/29/2021   HGBA1C 5.9 (H) 10/12/2020   HGBA1C 6.2 (H) 06/21/2020   Lab Results  Component Value Date   INSULIN 17.4 06/14/2021   INSULIN 27.5 (H) 10/12/2020   INSULIN 21.9 07/03/2020   Lab Results  Component Value Date   VD25OH 49.7 09/05/2021   VD25OH 46.6 06/14/2021   Attestation Statements:   Reviewed by clinician on day of visit: allergies, medications, problem list, medical history, surgical history, family history, social history, and previous encounter notes.  Leodis Binet Friedenbach, CMA, am acting as Location manager for [employer], DO.  I have reviewed the above documentation for accuracy and completeness, and I agree with the above. -  [name], DO, MS, FAAFP, DABOM - Family and Bariatric Medicine.

## 2022-01-04 ENCOUNTER — Telehealth: Payer: PPO

## 2022-01-08 ENCOUNTER — Encounter (INDEPENDENT_AMBULATORY_CARE_PROVIDER_SITE_OTHER): Payer: Self-pay | Admitting: Family Medicine

## 2022-01-08 DIAGNOSIS — J302 Other seasonal allergic rhinitis: Secondary | ICD-10-CM

## 2022-01-09 MED ORDER — MONTELUKAST SODIUM 10 MG PO TABS: 10.0000 mg | ORAL_TABLET | Freq: Every day | ORAL | 3 refills | Status: AC

## 2022-01-15 DIAGNOSIS — M9901 Segmental and somatic dysfunction of cervical region: Secondary | ICD-10-CM | POA: Diagnosis not present

## 2022-01-15 DIAGNOSIS — M79641 Pain in right hand: Secondary | ICD-10-CM | POA: Diagnosis not present

## 2022-01-15 DIAGNOSIS — M542 Cervicalgia: Secondary | ICD-10-CM | POA: Diagnosis not present

## 2022-01-15 DIAGNOSIS — M9904 Segmental and somatic dysfunction of sacral region: Secondary | ICD-10-CM | POA: Diagnosis not present

## 2022-01-15 DIAGNOSIS — M25551 Pain in right hip: Secondary | ICD-10-CM | POA: Diagnosis not present

## 2022-01-15 DIAGNOSIS — M79672 Pain in left foot: Secondary | ICD-10-CM | POA: Diagnosis not present

## 2022-01-15 DIAGNOSIS — M9903 Segmental and somatic dysfunction of lumbar region: Secondary | ICD-10-CM | POA: Diagnosis not present

## 2022-01-15 DIAGNOSIS — M9905 Segmental and somatic dysfunction of pelvic region: Secondary | ICD-10-CM | POA: Diagnosis not present

## 2022-01-15 DIAGNOSIS — M9902 Segmental and somatic dysfunction of thoracic region: Secondary | ICD-10-CM | POA: Diagnosis not present

## 2022-01-15 DIAGNOSIS — G4486 Cervicogenic headache: Secondary | ICD-10-CM | POA: Diagnosis not present

## 2022-01-15 DIAGNOSIS — M9906 Segmental and somatic dysfunction of lower extremity: Secondary | ICD-10-CM | POA: Diagnosis not present

## 2022-01-15 DIAGNOSIS — M545 Low back pain, unspecified: Secondary | ICD-10-CM | POA: Diagnosis not present

## 2022-01-29 ENCOUNTER — Encounter: Payer: Self-pay | Admitting: Orthopedic Surgery

## 2022-01-29 ENCOUNTER — Ambulatory Visit: Payer: PPO | Admitting: Orthopedic Surgery

## 2022-01-29 DIAGNOSIS — M65341 Trigger finger, right ring finger: Secondary | ICD-10-CM | POA: Insufficient documentation

## 2022-01-29 DIAGNOSIS — M65331 Trigger finger, right middle finger: Secondary | ICD-10-CM

## 2022-01-29 NOTE — H&P (View-Only) (Signed)
? ?Office Visit Note ?  ?Patient: Mary Hernandez           ?Date of Birth: [birth year]           ?MRN: [id number] ?Visit Date: 01/29/2022 ?             ?Requested by: Leamon Arnt, MD ?[address] ?[place],  [place] [address] ?PCP: Leamon Arnt, MD ? ? ?Assessment & Plan: ?Visit Diagnoses:  ?1. Trigger finger, right ring finger   ?2. Trigger finger, right middle finger   ? ? ?Plan: We discussed the nature of trigger finger as well as its diagnosis, prognosis, and both conservative and surgical treatment options.  Patient has failed corticosteroid injection into the right ring and middle fingers with continued symptomatic triggering.  We discussed the nature of open trigger finger release as well as the expected postoperative course.  We discussed the risk of surgery including bleeding, infection, damage to neurovascular structures, incomplete symptom relief, need for additional procedures.  After discussion, the patient like to proceed with surgery.  The surgical date and time will be confirmed with the patient. ? ?Follow-Up Instructions: No follow-ups on file.  ? ?Orders:  ?No orders of the defined types were placed in this encounter. ? ?No orders of the defined types were placed in this encounter. ? ? ? ? Procedures: ?No procedures performed ? ? ?Clinical Data: ?No additional findings. ? ? ?Subjective: ?Chief Complaint  ?Patient presents with  ? Right Hand - Pain  ?  Has trigger fingers, ring and starting in middle finger, RIGHT Handed, Pain: 1-6/10 depending on activity  ? ? ?Is a 71 year old right-hand-dominant female presents with painful triggering of the right ring and middle fingers.  This been going on for some time now.  She had a corticosteroid injection in these fingers 3 to 4 months ago.  She notes that she has had 2 rounds of injections into this hand.  She continues to have painful triggering with locking and catching of the digits.  Her ring finger is the worst but the middle finger is  also quite symptomatic.  She has difficulty with activities involve squeezing or tight grip.   ? ? ?Review of Systems ? ? ?Objective: ?Vital Signs: BP 98/62 (BP Location: Left Arm, Patient Position: Sitting)   Pulse 66   Ht '5\' 5"'$  (1.651 m)   Wt 166 lb (75.3 kg)   BMI 27.62 kg/m?  ? ?Physical Exam ?Constitutional:   ?   Appearance: Normal appearance.  ?Cardiovascular:  ?   Rate and Rhythm: Normal rate.  ?   Pulses: Normal pulses.  ?Pulmonary:  ?   Effort: Pulmonary effort is normal.  ?Skin: ?   General: Skin is warm and dry.  ?   Capillary Refill: Capillary refill takes less than 2 seconds.  ?Neurological:  ?   Mental Status: She is alert.  ? ? ?Right Hand Exam  ? ?Tenderness  ?Right hand tenderness location: TTP at ring finger A1 pulley. ? ?Other  ?Erythema: absent ?Sensation: normal ?Pulse: present ? ?Comments:  Palpable and visible triggering of ring and middle fingers.  ? ? ? ? ?Specialty Comments:  ?No specialty comments available. ? ?Imaging: ?No results found. ? ? ?PMFS History: ?Patient Active Problem List  ? Diagnosis Date Noted  ? Trigger finger, right ring finger 01/29/2022  ? Trigger finger, right middle finger 01/29/2022  ? Influenza A (H1N1) 09/03/2021  ? Obesity, Class I, BMI 30-34.9 10/07/2019  ? Adjustment  disorder with anxiety 10/07/2019  ? Notalgia paresthetica 07/01/2019  ? Insomnia due to medical condition 10/18/2018  ? Prediabetes 10/12/2018  ? History of rotator cuff syndrome with tear and adhesive capsulitis 09/11/2018  ? History of lateral epicondylitis of right elbow 06/25/2018  ? Diverticulosis 03/30/2018  ? GERD (gastroesophageal reflux disease) 03/30/2018  ? Mixed hyperlipidemia 03/01/2018  ? Acquired hypothyroidism 03/01/2018  ? Meniere disease 03/01/2018  ? Frequent PVCs 03/01/2018  ? ?Past Medical History:  ?Diagnosis Date  ? Acquired hypothyroidism 03/01/2018  ? hx of goiter; s/p thyroidectomy  ? Anxiety   ? Atrial premature depolarization   ? BPPV (benign paroxysmal positional  vertigo) 03/01/2018  ? Colon polyps   ? Essential hypertension 03/01/2018  ? denies hx - on diuretic for Meniere's and beta blocker for PVCs  ? Frequent PVCs 03/01/2018  ? Controlled on Flecainide, Acebutolol  ? Gallbladder problem   ? GERD (gastroesophageal reflux disease)   ? Hip pain   ? History of cardiac catheterization   ? Nuc 7/16:  normal perfusion; EF 76 // LHC 8/16:  normal coronary arteries  ? History of depression   ? History of dizziness   ? History of echocardiogram   ? Echo 03/2006:  Normal LVEF  ? History of stomach ulcers   ? HLD (hyperlipidemia)   ? Hypothyroidism   ? IBS (irritable bowel syndrome)   ? Infertility, female   ? Insulin resistance   ? Lower back pain   ? Meniere's disease   ? Notalgia paresthetica   ? Prediabetes   ? PVC (premature ventricular contraction)   ? Vertigo   ? Vitamin D deficiency   ?  ?Family History  ?Problem Relation Age of Onset  ? Breast cancer Mother   ? Hyperlipidemia Mother   ? Thyroid disease Mother   ? Cancer Mother   ? Heart attack Father   ? Pulmonary fibrosis Father   ? Peripheral Artery Disease Father   ?     s/p CEA  ? Heart disease Father   ? Hyperlipidemia Father   ? Early death Sister   ? Depression Brother   ? Hearing loss Brother   ? Diabetes Daughter   ? Heart disease Paternal Grandmother   ? Colon cancer Neg Hx   ? Stomach cancer Neg Hx   ?  ?Past Surgical History:  ?Procedure Laterality Date  ? BLADDER SURGERY  1997  ? BREAST BIOPSY  2013  ? BREAST REDUCTION SURGERY  1999  ? CARDIAC CATHETERIZATION  05/2015  ? Carpel Tunnel Release    ? CHOLECYSTECTOMY  2011  ? COLONOSCOPY  08/20/2015  ? ESOPHAGOGASTRODUODENOSCOPY  01/13/2017  ? [place]  ? REDUCTION MAMMAPLASTY    ? REFRACTIVE SURGERY    ? S/P Eye Right 1967  ? Muscle Clipped   ? THYROIDECTOMY  2012  ? TONSILLECTOMY  1975  ? TOTAL ABDOMINAL HYSTERECTOMY  1996  ? ?Social History  ? ?Occupational History  ? Occupation: Retired Educational psychologist  ?Tobacco Use  ? Smoking status:  Never  ? Smokeless tobacco: Never  ?Vaping Use  ? Vaping Use: Never used  ?Substance and Sexual Activity  ? Alcohol use: Yes  ?  Comment: very limited   ? Drug use: Never  ? Sexual activity: Not on file  ? ? ? ? ? ? ?

## 2022-01-29 NOTE — Progress Notes (Signed)
? ?Office Visit Note ?  ?Patient: Mary Hernandez           ?Date of Birth: 06-Mar-1951           ?MRN: [id number] ?Visit Date: 01/29/2022 ?             ?Requested by: Leamon Arnt, MD ?[address] ?[place],  [place] [address] ?PCP: Leamon Arnt, MD ? ? ?Assessment & Plan: ?Visit Diagnoses:  ?1. Trigger finger, right ring finger   ?2. Trigger finger, right middle finger   ? ? ?Plan: We discussed the nature of trigger finger as well as its diagnosis, prognosis, and both conservative and surgical treatment options.  Patient has failed corticosteroid injection into the right ring and middle fingers with continued symptomatic triggering.  We discussed the nature of open trigger finger release as well as the expected postoperative course.  We discussed the risk of surgery including bleeding, infection, damage to neurovascular structures, incomplete symptom relief, need for additional procedures.  After discussion, the patient like to proceed with surgery.  The surgical date and time will be confirmed with the patient. ? ?Follow-Up Instructions: No follow-ups on file.  ? ?Orders:  ?No orders of the defined types were placed in this encounter. ? ?No orders of the defined types were placed in this encounter. ? ? ? ? Procedures: ?No procedures performed ? ? ?Clinical Data: ?No additional findings. ? ? ?Subjective: ?Chief Complaint  ?Patient presents with  ? Right Hand - Pain  ?  Has trigger fingers, ring and starting in middle finger, RIGHT Handed, Pain: 1-6/10 depending on activity  ? ? ?Is a 71 year old right-hand-dominant female presents with painful triggering of the right ring and middle fingers.  This been going on for some time now.  She had a corticosteroid injection in these fingers 3 to 4 months ago.  She notes that she has had 2 rounds of injections into this hand.  She continues to have painful triggering with locking and catching of the digits.  Her ring finger is the worst but the middle finger is  also quite symptomatic.  She has difficulty with activities involve squeezing or tight grip.   ? ? ?Review of Systems ? ? ?Objective: ?Vital Signs: BP 98/62 (BP Location: Left Arm, Patient Position: Sitting)   Pulse 66   Ht '5\' 5"'$  (1.651 m)   Wt 166 lb (75.3 kg)   BMI 27.62 kg/m?  ? ?Physical Exam ?Constitutional:   ?   Appearance: Normal appearance.  ?Cardiovascular:  ?   Rate and Rhythm: Normal rate.  ?   Pulses: Normal pulses.  ?Pulmonary:  ?   Effort: Pulmonary effort is normal.  ?Skin: ?   General: Skin is warm and dry.  ?   Capillary Refill: Capillary refill takes less than 2 seconds.  ?Neurological:  ?   Mental Status: She is alert.  ? ? ?Right Hand Exam  ? ?Tenderness  ?Right hand tenderness location: TTP at ring finger A1 pulley. ? ?Other  ?Erythema: absent ?Sensation: normal ?Pulse: present ? ?Comments:  Palpable and visible triggering of ring and middle fingers.  ? ? ? ? ?Specialty Comments:  ?No specialty comments available. ? ?Imaging: ?No results found. ? ? ?PMFS History: ?Patient Active Problem List  ? Diagnosis Date Noted  ? Trigger finger, right ring finger 01/29/2022  ? Trigger finger, right middle finger 01/29/2022  ? Influenza A (H1N1) 09/03/2021  ? Obesity, Class I, BMI 30-34.9 10/07/2019  ? Adjustment  disorder with anxiety 10/07/2019  ? Notalgia paresthetica 07/01/2019  ? Insomnia due to medical condition 10/18/2018  ? Prediabetes 10/12/2018  ? History of rotator cuff syndrome with tear and adhesive capsulitis 09/11/2018  ? History of lateral epicondylitis of right elbow 06/25/2018  ? Diverticulosis 03/30/2018  ? GERD (gastroesophageal reflux disease) 03/30/2018  ? Mixed hyperlipidemia 03/01/2018  ? Acquired hypothyroidism 03/01/2018  ? Meniere disease 03/01/2018  ? Frequent PVCs 03/01/2018  ? ?Past Medical History:  ?Diagnosis Date  ? Acquired hypothyroidism 03/01/2018  ? hx of goiter; s/p thyroidectomy  ? Anxiety   ? Atrial premature depolarization   ? BPPV (benign paroxysmal positional  vertigo) 03/01/2018  ? Colon polyps   ? Essential hypertension 03/01/2018  ? denies hx - on diuretic for Meniere's and beta blocker for PVCs  ? Frequent PVCs 03/01/2018  ? Controlled on Flecainide, Acebutolol  ? Gallbladder problem   ? GERD (gastroesophageal reflux disease)   ? Hip pain   ? History of cardiac catheterization   ? Nuc 7/16:  normal perfusion; EF 76 // LHC 8/16:  normal coronary arteries  ? History of depression   ? History of dizziness   ? History of echocardiogram   ? Echo 03/2006:  Normal LVEF  ? History of stomach ulcers   ? HLD (hyperlipidemia)   ? Hypothyroidism   ? IBS (irritable bowel syndrome)   ? Infertility, female   ? Insulin resistance   ? Lower back pain   ? Meniere's disease   ? Notalgia paresthetica   ? Prediabetes   ? PVC (premature ventricular contraction)   ? Vertigo   ? Vitamin D deficiency   ?  ?Family History  ?Problem Relation Age of Onset  ? Breast cancer Mother   ? Hyperlipidemia Mother   ? Thyroid disease Mother   ? Cancer Mother   ? Heart attack Father   ? Pulmonary fibrosis Father   ? Peripheral Artery Disease Father   ?     s/p CEA  ? Heart disease Father   ? Hyperlipidemia Father   ? Early death Sister   ? Depression Brother   ? Hearing loss Brother   ? Diabetes Daughter   ? Heart disease Paternal Grandmother   ? Colon cancer Neg Hx   ? Stomach cancer Neg Hx   ?  ?Past Surgical History:  ?Procedure Laterality Date  ? BLADDER SURGERY  1997  ? BREAST BIOPSY  2013  ? BREAST REDUCTION SURGERY  1999  ? CARDIAC CATHETERIZATION  05/2015  ? Carpel Tunnel Release    ? CHOLECYSTECTOMY  2011  ? COLONOSCOPY  08/20/2015  ? ESOPHAGOGASTRODUODENOSCOPY  01/13/2017  ? [place]  ? REDUCTION MAMMAPLASTY    ? REFRACTIVE SURGERY    ? S/P Eye Right 1967  ? Muscle Clipped   ? THYROIDECTOMY  2012  ? TONSILLECTOMY  1975  ? TOTAL ABDOMINAL HYSTERECTOMY  1996  ? ?Social History  ? ?Occupational History  ? Occupation: Retired Educational psychologist  ?Tobacco Use  ? Smoking status:  Never  ? Smokeless tobacco: Never  ?Vaping Use  ? Vaping Use: Never used  ?Substance and Sexual Activity  ? Alcohol use: Yes  ?  Comment: very limited   ? Drug use: Never  ? Sexual activity: Not on file  ? ? ? ? ? ? ?

## 2022-01-31 DIAGNOSIS — M9901 Segmental and somatic dysfunction of cervical region: Secondary | ICD-10-CM | POA: Diagnosis not present

## 2022-01-31 DIAGNOSIS — M25551 Pain in right hip: Secondary | ICD-10-CM | POA: Diagnosis not present

## 2022-01-31 DIAGNOSIS — M9902 Segmental and somatic dysfunction of thoracic region: Secondary | ICD-10-CM | POA: Diagnosis not present

## 2022-01-31 DIAGNOSIS — M9908 Segmental and somatic dysfunction of rib cage: Secondary | ICD-10-CM | POA: Diagnosis not present

## 2022-01-31 DIAGNOSIS — M9907 Segmental and somatic dysfunction of upper extremity: Secondary | ICD-10-CM | POA: Diagnosis not present

## 2022-01-31 DIAGNOSIS — M542 Cervicalgia: Secondary | ICD-10-CM | POA: Diagnosis not present

## 2022-02-12 ENCOUNTER — Other Ambulatory Visit: Payer: Self-pay

## 2022-02-12 ENCOUNTER — Encounter (HOSPITAL_BASED_OUTPATIENT_CLINIC_OR_DEPARTMENT_OTHER): Payer: Self-pay | Admitting: Orthopedic Surgery

## 2022-02-14 ENCOUNTER — Telehealth: Payer: Self-pay | Admitting: Pharmacist

## 2022-02-14 NOTE — Progress Notes (Signed)
Chronic Care Management Pharmacy Assistant   Name: Mary Hernandez  MRN: [id number] DOB: 15-Sep-1951  Reason for Encounter: Medication Coordination Call    Recent office visits:  None  Recent consult visits:  01/29/2022 OV (Orthopedics) Sherilyn Cooter, MD; no medication changes indicated.  01/01/2022 OV (Weight Management) [name], DO; Start Singulair for seasonal allergies  12/04/2021 OV (Weight Management) [name], DO; no medication changes indicated.  Hospital visits:  None in previous 6 months  Medications: Outpatient Encounter Medications as of 02/14/2022  Medication Sig   acebutolol (SECTRAL) 200 MG capsule TAKE ONE CAPSULE BY MOUTH DAILY   Calcium Citrate-Vitamin D 315-250 MG-UNIT TABS Take by mouth.   diclofenac Sodium (VOLTAREN) 1 % GEL Apply 2 g topically 4 (four) times daily.   EVENING PRIMROSE OIL PO Take 1,500 mg by mouth 2 (two) times daily.   flecainide (TAMBOCOR) 50 MG tablet Take 1.5 tablets (75 mg total) by mouth 2 (two) times daily.   fluticasone (FLONASE) 50 MCG/ACT nasal spray Place 1 spray into both nostrils daily.   Glucosamine-Chondroit-Vit C-Mn (GLUCOSAMINE 1500 COMPLEX) CAPS Take by mouth.   glycopyrrolate (ROBINUL) 1 MG tablet Take 1 tablet (1 mg total) by mouth 2 (two) times daily.   levothyroxine (SYNTHROID) 100 MCG tablet Take 1 tablet (100 mcg total) by mouth daily.   MAGNESIUM PO Take by mouth.   montelukast (SINGULAIR) 10 MG tablet Take 1 tablet (10 mg total) by mouth at bedtime.   naltrexone (DEPADE) 50 MG tablet Take 50 mg by mouth in the morning and at bedtime.   omega-3 acid ethyl esters (LOVAZA) 1 g capsule Take 1 g by mouth 2 (two) times daily.   ondansetron (ZOFRAN-ODT) 4 MG disintegrating tablet Take 4 mg by mouth every 8 (eight) hours as needed for nausea or vomiting.   Potassium Chloride ER 20 MEQ TBCR Take 20 mEq by mouth daily. Please keep upcoming appt in January 2023 with Dr. Caryl Comes before anymore refills. Thank  you   pregabalin (LYRICA) 75 MG capsule Take 75 mg by mouth 2 (two) times daily.   Probiotic Product (PROBIOTIC-10 PO) Take 1 capsule by mouth daily.   rosuvastatin (CRESTOR) 10 MG tablet Take 1 tablet (10 mg total) by mouth daily.   Semaglutide, 2 MG/DOSE, (OZEMPIC, 2 MG/DOSE,) 8 MG/3ML SOPN Inject 2 mg into the skin once a week.   sertraline (ZOLOFT) 50 MG tablet Take 2 tablets (100 mg total) by mouth daily.   TART CHERRY PO Take 1 tablet by mouth daily.   triamterene-hydrochlorothiazide (MAXZIDE-25) 37.5-25 MG tablet Take 0.5 tablets by mouth daily.   Turmeric 400 MG CAPS Take 3 capsules by mouth daily.   UNABLE TO FIND Inject 2 mg into the skin once a week. Med Name: Ozempic 2 mg dose (8 mg/3 mL subcutaneous pen injector)   No facility-administered encounter medications on file as of 02/14/2022.   Reviewed chart for medication changes ahead of medication coordination call.  No OVs, Consults, or hospital visits since last care coordination call/Pharmacist visit. (If appropriate, list visit date, provider name)  No medication changes indicated OR if recent visit, treatment plan here.  BP Readings from Last 3 Encounters:  01/29/22 98/62  01/01/22 93/60  12/04/21 (!) 101/59    Lab Results  Component Value Date   HGBA1C 5.8 (H) 10/23/2021     Patient obtains medications through Adherence Packaging  90 Days   Last adherence delivery included:  Fish Oil 1200 mg Take two capsules every morning  and take one capsule every evening Evening Primrose 1000 Take one capsule every morning Calcium Citrate 315 Take one tab every morning and take one tab every evening Glucosamine Sulfate 500-400 Take one tab every morning and take one tab every evening Claritin 10 mg Tablet Take one tab every evening Tart Cherry 1200 mg Tab Take one tab every   Patient is due for next adherence delivery on: 02/26/2022. Called patient and reviewed medications and coordinated delivery.  This delivery to  include:  **Unsuccessful attempt at reaching patient to complete this call. **  Care Gaps: Medicare Annual Wellness: Completed 11/16/2021 Hemoglobin A1C: 5.8% on 10/23/2021 Colonoscopy: Next due on 08/11/2023 Dexa Scan: Next due on 05/29/2023 Mammogram: Next due on 06/13/2022  Future Appointments  Date Time Provider [place]  03/01/2022 10:15 AM Sherilyn Cooter, MD [facility] None   Star Rating Drugs:  April D Calhoun, [place] Pharmacist Assistant [phone]

## 2022-02-18 ENCOUNTER — Telehealth: Payer: Self-pay | Admitting: Orthopedic Surgery

## 2022-02-18 ENCOUNTER — Encounter (HOSPITAL_BASED_OUTPATIENT_CLINIC_OR_DEPARTMENT_OTHER)
Admission: RE | Admit: 2022-02-18 | Discharge: 2022-02-18 | Disposition: A | Discharge status: Home / Self Care | Payer: PPO | Source: Ambulatory Visit | Attending: Orthopedic Surgery | Admitting: Orthopedic Surgery

## 2022-02-18 DIAGNOSIS — I10 Essential (primary) hypertension: Secondary | ICD-10-CM | POA: Diagnosis not present

## 2022-02-18 DIAGNOSIS — M9901 Segmental and somatic dysfunction of cervical region: Secondary | ICD-10-CM | POA: Diagnosis not present

## 2022-02-18 DIAGNOSIS — M25551 Pain in right hip: Secondary | ICD-10-CM | POA: Diagnosis not present

## 2022-02-18 DIAGNOSIS — M542 Cervicalgia: Secondary | ICD-10-CM | POA: Diagnosis not present

## 2022-02-18 DIAGNOSIS — M9908 Segmental and somatic dysfunction of rib cage: Secondary | ICD-10-CM | POA: Diagnosis not present

## 2022-02-18 DIAGNOSIS — M9902 Segmental and somatic dysfunction of thoracic region: Secondary | ICD-10-CM | POA: Diagnosis not present

## 2022-02-18 DIAGNOSIS — M9906 Segmental and somatic dysfunction of lower extremity: Secondary | ICD-10-CM | POA: Diagnosis not present

## 2022-02-18 DIAGNOSIS — M9907 Segmental and somatic dysfunction of upper extremity: Secondary | ICD-10-CM | POA: Diagnosis not present

## 2022-02-18 DIAGNOSIS — Z01812 Encounter for preprocedural laboratory examination: Secondary | ICD-10-CM | POA: Insufficient documentation

## 2022-02-18 LAB — BASIC METABOLIC PANEL
Anion gap: 10 (ref 5–15)
BUN: 15 mg/dL (ref 8–23)
CO2: 26 mmol/L (ref 22–32)
Calcium: 8.4 mg/dL — ABNORMAL LOW (ref 8.9–10.3)
Chloride: 104 mmol/L (ref 98–111)
Creatinine, Ser: 0.6 mg/dL (ref 0.44–1.00)
GFR, Estimated: >60 mL/min (ref >60)
Glucose, Bld: 108 mg/dL — ABNORMAL HIGH (ref 70–99)
Potassium: 4.1 mmol/L (ref 3.5–5.1)
Sodium: 140 mmol/L (ref 135–145)

## 2022-02-18 NOTE — Telephone Encounter (Signed)
Aware and will be here tomorrow at 36 for nurse visit  ?

## 2022-02-18 NOTE — Telephone Encounter (Signed)
Pt called and states that her thumb on her hand she is having surgery is hurting her now and its knotted and kind of swollen. She didn't know if [place] wanted to take a look before wed ( the day of the surgery.)  ? ?CB [phone]  ?

## 2022-02-19 ENCOUNTER — Ambulatory Visit: Payer: PPO

## 2022-02-19 DIAGNOSIS — R632 Polyphagia: Secondary | ICD-10-CM | POA: Diagnosis not present

## 2022-02-19 DIAGNOSIS — Z6827 Body mass index (BMI) 27.0-27.9, adult: Secondary | ICD-10-CM | POA: Diagnosis not present

## 2022-02-19 DIAGNOSIS — F3289 Other specified depressive episodes: Secondary | ICD-10-CM | POA: Diagnosis not present

## 2022-02-19 DIAGNOSIS — I10 Essential (primary) hypertension: Secondary | ICD-10-CM | POA: Diagnosis not present

## 2022-02-19 DIAGNOSIS — E663 Overweight: Secondary | ICD-10-CM | POA: Diagnosis not present

## 2022-02-20 ENCOUNTER — Ambulatory Visit (HOSPITAL_BASED_OUTPATIENT_CLINIC_OR_DEPARTMENT_OTHER): Payer: PPO | Admitting: Certified Registered"

## 2022-02-20 ENCOUNTER — Encounter (HOSPITAL_BASED_OUTPATIENT_CLINIC_OR_DEPARTMENT_OTHER): Payer: Self-pay | Admitting: Orthopedic Surgery

## 2022-02-20 ENCOUNTER — Other Ambulatory Visit: Payer: Self-pay

## 2022-02-20 ENCOUNTER — Ambulatory Visit (HOSPITAL_BASED_OUTPATIENT_CLINIC_OR_DEPARTMENT_OTHER)
Admission: RE | Admit: 2022-02-20 | Discharge: 2022-02-20 | Disposition: A | Discharge status: Home / Self Care | Payer: PPO | Attending: Orthopedic Surgery | Admitting: Orthopedic Surgery

## 2022-02-20 ENCOUNTER — Encounter (HOSPITAL_BASED_OUTPATIENT_CLINIC_OR_DEPARTMENT_OTHER)
Admission: RE | Disposition: A | Discharge status: Home / Self Care | Payer: Self-pay | Source: Home / Self Care | Attending: Orthopedic Surgery

## 2022-02-20 DIAGNOSIS — M65341 Trigger finger, right ring finger: Secondary | ICD-10-CM

## 2022-02-20 DIAGNOSIS — M65331 Trigger finger, right middle finger: Secondary | ICD-10-CM | POA: Insufficient documentation

## 2022-02-20 DIAGNOSIS — E039 Hypothyroidism, unspecified: Secondary | ICD-10-CM | POA: Insufficient documentation

## 2022-02-20 DIAGNOSIS — I10 Essential (primary) hypertension: Secondary | ICD-10-CM | POA: Insufficient documentation

## 2022-02-20 HISTORY — DX: Other specified postprocedural states: Z98.890

## 2022-02-20 HISTORY — PX: TRIGGER FINGER RELEASE: SHX641

## 2022-02-20 HISTORY — DX: Other complications of anesthesia, initial encounter: T88.59XA

## 2022-02-20 HISTORY — DX: Other specified postprocedural states: R11.2

## 2022-02-20 SURGERY — RELEASE, A1 PULLEY, FOR TRIGGER FINGER
Anesthesia: Regional | Site: Hand | Laterality: Right

## 2022-02-20 MED ORDER — OXYCODONE HCL 5 MG/5ML PO SOLN: 5.0000 mg | Freq: Once | ORAL | Status: DC | PRN

## 2022-02-20 MED ORDER — ONDANSETRON HCL 4 MG/2ML IJ SOLN: 4.0000 mg | Freq: Once | INTRAMUSCULAR | Status: DC | PRN

## 2022-02-20 MED ORDER — LACTATED RINGERS IV SOLN: INTRAVENOUS | Status: DC

## 2022-02-20 MED ORDER — LIDOCAINE HCL 1 % IJ SOLN
INTRAMUSCULAR | Status: DC | PRN
Start: 2022-02-20 — End: 2022-02-20
  Administered 2022-02-20: 6 mL

## 2022-02-20 MED ORDER — PROPOFOL 10 MG/ML IV BOLUS
INTRAVENOUS | Status: DC | PRN
  Administered 2022-02-20 (×2): 20 mg via INTRAVENOUS

## 2022-02-20 MED ORDER — OXYCODONE HCL 5 MG PO TABS: 5.0000 mg | ORAL_TABLET | Freq: Once | ORAL | Status: DC | PRN

## 2022-02-20 MED ORDER — OXYCODONE HCL 5 MG PO TABS: 5.0000 mg | ORAL_TABLET | Freq: Four times a day (QID) | ORAL | 0 refills | Status: AC | PRN

## 2022-02-20 MED ORDER — LIDOCAINE HCL (PF) 1 % IJ SOLN
INTRAMUSCULAR | Status: AC
  Filled 2022-02-20: qty 30

## 2022-02-20 MED ORDER — PROPOFOL 500 MG/50ML IV EMUL
INTRAVENOUS | Status: DC | PRN
  Administered 2022-02-20: 200 ug/kg/min via INTRAVENOUS

## 2022-02-20 MED ORDER — ACETAMINOPHEN 160 MG/5ML PO SOLN: 325.0000 mg | ORAL | Status: DC | PRN

## 2022-02-20 MED ORDER — LIDOCAINE 2% (20 MG/ML) 5 ML SYRINGE
INTRAMUSCULAR | Status: DC | PRN
  Administered 2022-02-20: 40 mg via INTRAVENOUS

## 2022-02-20 MED ORDER — MEPERIDINE HCL 25 MG/ML IJ SOLN: 6.2500 mg | INTRAMUSCULAR | Status: DC | PRN

## 2022-02-20 MED ORDER — FENTANYL CITRATE (PF) 100 MCG/2ML IJ SOLN
INTRAMUSCULAR | Status: DC | PRN
Start: 2022-02-20 — End: 2022-02-20
  Administered 2022-02-20 (×2): 50 ug via INTRAVENOUS

## 2022-02-20 MED ORDER — 0.9 % SODIUM CHLORIDE (POUR BTL) OPTIME
TOPICAL | Status: DC | PRN
  Administered 2022-02-20: 30 mL

## 2022-02-20 MED ORDER — FENTANYL CITRATE (PF) 100 MCG/2ML IJ SOLN: 25.0000 ug | INTRAMUSCULAR | Status: DC | PRN

## 2022-02-20 MED ORDER — BUPIVACAINE HCL (PF) 0.25 % IJ SOLN
INTRAMUSCULAR | Status: AC
  Filled 2022-02-20: qty 30

## 2022-02-20 MED ORDER — ACETAMINOPHEN 325 MG PO TABS: 325.0000 mg | ORAL_TABLET | ORAL | Status: DC | PRN

## 2022-02-20 MED ORDER — FENTANYL CITRATE (PF) 100 MCG/2ML IJ SOLN
INTRAMUSCULAR | Status: AC
  Filled 2022-02-20: qty 2

## 2022-02-20 MED ORDER — ONDANSETRON HCL 4 MG/2ML IJ SOLN
INTRAMUSCULAR | Status: DC | PRN
Start: 2022-02-20 — End: 2022-02-20
  Administered 2022-02-20: 4 mg via INTRAVENOUS

## 2022-02-20 SURGICAL SUPPLY — 35 items
APL PRP STRL LF DISP 70% ISPRP (MISCELLANEOUS) ×1
BLADE SURG 15 STRL LF DISP TIS (BLADE) ×1 IMPLANT
BLADE SURG 15 STRL SS (BLADE) ×2
BNDG CMPR 9X4 STRL LF SNTH (GAUZE/BANDAGES/DRESSINGS) ×1
BNDG ELASTIC 2X5.8 VLCR STR LF (GAUZE/BANDAGES/DRESSINGS) ×2 IMPLANT
BNDG ESMARK 4X9 LF (GAUZE/BANDAGES/DRESSINGS) ×2 IMPLANT
CHLORAPREP W/TINT 26 (MISCELLANEOUS) ×2 IMPLANT
CORD BIPOLAR FORCEPS 12FT (ELECTRODE) ×2 IMPLANT
COVER BACK TABLE 60X90IN (DRAPES) ×2 IMPLANT
COVER MAYO STAND STRL (DRAPES) ×2 IMPLANT
CUFF TOURN SGL QUICK 18X4 (TOURNIQUET CUFF) IMPLANT
CUFF TOURN SGL QUICK 24 (TOURNIQUET CUFF)
CUFF TRNQT CYL 24X4X16.5-23 (TOURNIQUET CUFF) IMPLANT
DRAPE EXTREMITY T 121X128X90 (DISPOSABLE) ×2 IMPLANT
DRAPE SURG 17X23 STRL (DRAPES) ×2 IMPLANT
GAUZE SPONGE 4X4 12PLY STRL (GAUZE/BANDAGES/DRESSINGS) IMPLANT
GAUZE XEROFORM 1X8 LF (GAUZE/BANDAGES/DRESSINGS) ×2 IMPLANT
GLOVE BIO SURGEON STRL SZ7 (GLOVE) ×2 IMPLANT
GLOVE BIOGEL PI IND STRL 7.0 (GLOVE) ×1 IMPLANT
GLOVE BIOGEL PI INDICATOR 7.0 (GLOVE) ×1
GOWN STRL REUS W/ TWL LRG LVL3 (GOWN DISPOSABLE) ×1 IMPLANT
GOWN STRL REUS W/ TWL XL LVL3 (GOWN DISPOSABLE) ×1 IMPLANT
GOWN STRL REUS W/TWL LRG LVL3 (GOWN DISPOSABLE) ×2
GOWN STRL REUS W/TWL XL LVL3 (GOWN DISPOSABLE) ×2
NDL HYPO 25X1 1.5 SAFETY (NEEDLE) ×1 IMPLANT
NEEDLE HYPO 25X1 1.5 SAFETY (NEEDLE) ×2 IMPLANT
NS IRRIG 1000ML POUR BTL (IV SOLUTION) ×2 IMPLANT
PACK BASIN DAY SURGERY FS (CUSTOM PROCEDURE TRAY) ×2 IMPLANT
SLEEVE SCD COMPRESS KNEE MED (STOCKING) IMPLANT
SUT ETHILON 4 0 PS 2 18 (SUTURE) ×2 IMPLANT
SUT VICRYL 4-0 PS2 18IN ABS (SUTURE) IMPLANT
SYR BULB EAR ULCER 3OZ GRN STR (SYRINGE) ×2 IMPLANT
SYR CONTROL 10ML LL (SYRINGE) ×2 IMPLANT
TOWEL GREEN STERILE FF (TOWEL DISPOSABLE) ×2 IMPLANT
UNDERPAD 30X36 HEAVY ABSORB (UNDERPADS AND DIAPERS) ×2 IMPLANT

## 2022-02-20 NOTE — Anesthesia Postprocedure Evaluation (Signed)
Anesthesia Post Note ? ?Patient: Mary Hernandez ? ?Procedure(s) Performed: RIGHT MIDDLE AND RING FINGER RELEASE TRIGGER FINGER/A-1 PULLEY (Right: Hand) ? ?  ? ?Patient location during evaluation: PACU ?Anesthesia Type: MAC ?Level of consciousness: awake ?Pain management: pain level controlled ?Vital Signs Assessment: post-procedure vital signs reviewed and stable ?Respiratory status: spontaneous breathing ?Cardiovascular status: stable ?Postop Assessment: no apparent nausea or vomiting ?Anesthetic complications: no ? ? ?No notable events documented. ? ?Last Vitals:  ?Vitals:  ? 02/20/22 1000 02/20/22 1015  ?BP: (!) 147/80 (!) 153/76  ?Pulse: 71 64  ?Resp: 15 14  ?Temp: 36.6 ?C   ?SpO2: 98% 98%  ?  ?Last Pain:  ?Vitals:  ? 02/20/22 1000  ?TempSrc:   ?PainSc: 0-No pain  ? ? ?  ?  ?  ?  ?  ?  ? ?Huston Foley ? ? ? ? ?

## 2022-02-20 NOTE — Op Note (Signed)
? ?  Date of Surgery: 02/20/2022 ? ?INDICATIONS: Patient is a 71 y.o.-year-old female with right middle and ring finger trigger fingers that have failed conservative management.  Risks, benefits, and alternatives to surgery were again discussed with the patient in the preoperative area. The patient wishes to proceed with surgery.  Informed consent was signed after our discussion.  ? ?PREOPERATIVE DIAGNOSIS:  ?Right middle trigger finger ?Right ring trigger finger ? ?POSTOPERATIVE DIAGNOSIS: Same. ? ?PROCEDURE:  ?Right middle A1 pulley release ?Right ring A1 pulley release ? ? ?SURGEON: Audria Nine, M.D. ? ?ASSIST:  ? ?ANESTHESIA:  Local, MAC ? ?IV FLUIDS AND URINE: See anesthesia. ? ?ESTIMATED BLOOD LOSS: <5 mL. ? ?IMPLANTS: * No implants in log *  ? ?DRAINS: None ? ?COMPLICATIONS: None ? ?DESCRIPTION OF PROCEDURE: The patient was met in the preoperative holding area where the surgical site was marked and the consent form was verified.  The patient was then taken to the operating room and transferred to the operating table.  All bony prominences were well padded.  A tourniquet was applied to the right forearm.  Monitored sedation was induced.  A formal time-out was performed to confirm that this was the correct patient, surgery, side, and site. A local block was performed using 1% plain lidocaine. The operative extremity was prepped and draped in the usual and sterile fashion.   ? ?The limb was exsanguinated and the tourniquet inflated to 250 mmHg.  A longitudinal incision was made over the middle finger A1 pulley.  The skin was incised.  Blunt dissection was used to identify the A1 pulley.  Two Ragnell retractors were placed on the radial and ulnar sides of the pulley to protect the respective neurovascular bundles.  A third Ragnell was placed at the distal aspect of the wound.  The A1 pulley was clearly identified.  Under direct visualization, the pulley was entered sharply using a 15 blade.  Tenotomy scissors  were used to complete the pulley release distally to the level of the A2 pulley.  The distal retractor was then placed in the proximal aspect of the wound.  Under direct visualization, the proximal aspect of the A1 pulley was completely released.  ? ?The same process was repeated for the ring finger.  The tourniquet was let down and hemostasis achieved with direct pressure and bipolar electrocautery. ? ?Following satisfactory A1 pulley release, the patient was reversed from sedation and asked to fully flex and extend the involved finger.  There was no catching or triggering present  The wounds were then thoroughly irrigated.  They were closed using 4-0 nylon sutures in a horizontal mattress fashion.  The hand was dressed with xeroform, 4x4, and an ace wrap. ? ? ?POSTOPERATIVE PLAN: She will be discharged to home with appropriate pain medication and discharge instructions.  I will see her in 10-14 days for her first postop visit.  ? ?Audria Nine, MD ?9:57 AM  ?

## 2022-02-20 NOTE — Discharge Instructions (Addendum)
?Post Anesthesia Home Care Instructions ? ?Activity: ?Get plenty of rest for the remainder of the day. A responsible individual must stay with you for 24 hours following the procedure.  ?For the next 24 hours, DO NOT: ?-Drive a car ?-Paediatric nurse ?-Drink alcoholic beverages ?-Take any medication unless instructed by your physician ?-Make any legal decisions or sign important papers. ? ?Meals: ?Start with liquid foods such as gelatin or soup. Progress to regular foods as tolerated. Avoid greasy, spicy, heavy foods. If nausea and/or vomiting occur, drink only clear liquids until the nausea and/or vomiting subsides. Call your physician if vomiting continues. ? ?Special Instructions/Symptoms: ?Your throat may feel dry or sore from the anesthesia or the breathing tube placed in your throat during surgery. If this causes discomfort, gargle with warm salt water. The discomfort should disappear within 24 hours. ? ?If you had a scopolamine patch placed behind your ear for the management of post- operative nausea and/or vomiting: ? ?1. The medication in the patch is effective for 72 hours, after which it should be removed.  Wrap patch in a tissue and discard in the trash. Wash hands thoroughly with soap and water. ?2. You may remove the patch earlier than 72 hours if you experience unpleasant side effects which may include dry mouth, dizziness or visual disturbances. ?3. Avoid touching the patch. Wash your hands with soap and water after contact with the patch. ?    ? ? ?Mary Hernandez, M.D. ?Hand Surgery ? ?POST-OPERATIVE DISCHARGE INSTRUCTIONS ? ? ?PRESCRIPTIONS: ?- You have been given a prescription to be taken as directed for post-operative pain control.  You may also take over the counter ibuprofen/aleve and tylenol for pain. Take this as directed on the packaging. Do not exceed 3000 mg tylenol/acetaminophen in 24 hours. ? ?Ibuprofen 600-800 mg (3-4) tablets by mouth every 6 hours as needed for pain.   ? ?OR ? ?Aleve 2 tablets by mouth every 12 hours (twice daily) as needed for pain. ?  ?AND/OR ? ?Tylenol 1000 mg (2 tablets) every 8 hours as needed for pain. ? ?- Please use your pain medication carefully, as refills are limited and you may not be provided with one.  As stated above, please use over the counter pain medicine - it will also be helpful with decreasing your swelling.  ? ? ?ANESTHESIA: ?-After your surgery, post-surgical discomfort or pain is likely. This discomfort can last several days to a few weeks. At certain times of the day your discomfort may be more intense.  ? ?Did you receive a nerve block?  ? ?- A nerve block can provide pain relief for one hour to two days after your surgery. As long as the nerve block is working, you will experience little or no sensation in the area the surgeon operated on.  ?- As the nerve block wears off, you will begin to experience pain or discomfort. It is very important that you begin taking your prescribed pain medication before the nerve block fully wears off. Treating your pain at the first sign of the block wearing off will ensure your pain is better controlled and more tolerable when full-sensation returns. Do not wait until the pain is intolerable, as the medicine will be less effective. It is better to treat pain in advance than to try and catch up.  ? ?General Anesthesia:  ?If you did not receive a nerve block during your surgery, you will need to start taking your pain medication shortly after your surgery and should  continue to do so as prescribed by your surgeon.   ? ? ?ICE AND ELEVATION: ?- You may use ice for the first 48-72 hours, but it is not critical.   ?- Motion of your fingers is very important to decrease the swelling.  ?- Elevation, as much as possible for the next 48 hours, is critical for decreasing swelling as well as for pain relief. Elevation means when you are seated or lying down, you hand should be at or above your heart. When walking,  the hand needs to be at or above the level of your elbow.  ?- If the bandage gets too tight, it may need to be loosened. Please contact our office and we will instruct you in how to do this.  ? ? ?SURGICAL BANDAGES:  ?- Keep your dressing and/or splint clean and dry at all times.  You can remove your dressing 4 days from now and change with a dry dressing or Band-Aids as needed thereafter. ?- You may place a plastic bag over your bandage to shower, but be careful, do not get your bandages wet.  ?- After the bandages have been removed, it is OK to get the stitches wet in a shower or with hand washing. Do Not soak or submerge the wound yet. Please do not use lotions or creams on the stitches.   ?  ? ?HAND THERAPY:  ?- You may not need any. If you do, we will begin this at your follow up visit in the clinic.  ? ? ?ACTIVITY AND WORK: ?- You are encouraged to move any fingers which are not in the bandage.  ?- Light use of the fingers is allowed to assist the other hand with daily hygiene and eating, but strong gripping or lifting is often uncomfortable and should be avoided.  ?- You might miss a variable period of time from work and hopefully this issue has been discussed prior to surgery. You may not do any heavy work with your affected hand for about 2 weeks.  ? ? ?[facility] ?[address] ?[place],  [place]  [id number] ?[phone]  ?

## 2022-02-20 NOTE — Transfer of Care (Signed)
Immediate Anesthesia Transfer of Care Note ? ?Patient: Mary Hernandez ? ?Procedure(s) Performed: RIGHT MIDDLE AND RING FINGER RELEASE TRIGGER FINGER/A-1 PULLEY (Right: Hand) ? ?Patient Location: PACU ? ?Anesthesia Type:MAC ? ?Level of Consciousness: drowsy ? ?Airway & Oxygen Therapy: Patient Spontanous Breathing and Patient connected to face mask oxygen ? ?Post-op Assessment: Report given to RN and Post -op Vital signs reviewed and stable ? ?Post vital signs: Reviewed and stable ? ?Last Vitals:  ?Vitals Value Taken Time  ?BP 147/80   ?Temp 97.6   ?Pulse 68   ?Resp 13 02/20/22 1002  ?SpO2 98   ?Vitals shown include unvalidated device data. ? ?Last Pain:  ?Vitals:  ? 02/20/22 0741  ?TempSrc: Oral  ?PainSc: 6   ?   ? ?Patients Stated Pain Goal: 3 (02/20/22 0741) ? ?Complications: No notable events documented. ?

## 2022-02-20 NOTE — Interval H&P Note (Signed)
History and Physical Interval Note: ? ?02/20/2022 ?8:32 AM ? ?Mary Hernandez  has presented today for surgery, with the diagnosis of RIGHT MIDDLE AND RING FINGER TRIGGER FINGER.  The various methods of treatment have been discussed with the patient and family. After consideration of risks, benefits and other options for treatment, the patient has consented to  Procedure(s): ?RIGHT MIDDLE AND RING FINGER RELEASE TRIGGER FINGER/A-1 PULLEY (Right) as a surgical intervention.  The patient's history has been reviewed, patient examined, no change in status, stable for surgery.  I have reviewed the patient's chart and labs.  Questions were answered to the patient's satisfaction.   ? ? ?Giani Betzold Natalyn Szymanowski ? ? ?

## 2022-02-20 NOTE — Anesthesia Preprocedure Evaluation (Addendum)
Anesthesia Evaluation  ?Patient identified by MRN, date of birth, ID band ?Patient awake ? ? ? ?Reviewed: ?Allergy & Precautions, NPO status , Patient's Chart, lab work & pertinent test results ? ?History of Anesthesia Complications ?(+) PONV and history of anesthetic complications ? ?Airway ?Mallampati: I ? ? ? ? ? ? Dental ?no notable dental hx. ? ?  ?Pulmonary ?neg pulmonary ROS,  ?  ?Pulmonary exam normal ? ? ? ? ? ? ? Cardiovascular ?hypertension, Pt. on medications ?Normal cardiovascular exam ? ? ?  ?Neuro/Psych ?PSYCHIATRIC DISORDERS Anxiety Depression  Neuromuscular disease   ? GI/Hepatic ?Neg liver ROS,   ?Endo/Other  ?Hypothyroidism  ? Renal/GU ?negative Renal ROS  ?negative genitourinary ?  ?Musculoskeletal ? ? Abdominal ?Normal abdominal exam  (+)   ?Peds ? Hematology ?  ?Anesthesia Other Findings ? ? Reproductive/Obstetrics ? ?  ? ? ? ? ? ? ? ? ? ? ? ? ? ?  ?  ? ? ? ? ? ? ? ? ?Anesthesia Physical ?Anesthesia Plan ? ?ASA: 2 ? ?Anesthesia Plan: MAC  ? ?Post-op Pain Management: Minimal or no pain anticipated  ? ?Induction:  ? ?PONV Risk Score and Plan: Ondansetron, Dexamethasone, Midazolam, Propofol infusion and TIVA ? ?Airway Management Planned: Natural Airway, Simple Face Mask, Nasal Cannula and Mask ? ?Additional Equipment: None ? ?Intra-op Plan:  ? ?Post-operative Plan:  ? ?Informed Consent: I have reviewed the patients History and Physical, chart, labs and discussed the procedure including the risks, benefits and alternatives for the proposed anesthesia with the patient or authorized representative who has indicated his/her understanding and acceptance.  ? ? ? ?Dental advisory given ? ?Plan Discussed with: CRNA ? ?Anesthesia Plan Comments:   ? ? ? ? ? ?Anesthesia Quick Evaluation ? ?

## 2022-02-21 NOTE — Progress Notes (Signed)
No answer

## 2022-03-01 ENCOUNTER — Ambulatory Visit (INDEPENDENT_AMBULATORY_CARE_PROVIDER_SITE_OTHER): Payer: PPO | Admitting: Orthopedic Surgery

## 2022-03-01 ENCOUNTER — Encounter: Payer: Self-pay | Admitting: Orthopedic Surgery

## 2022-03-01 DIAGNOSIS — M65331 Trigger finger, right middle finger: Secondary | ICD-10-CM

## 2022-03-01 DIAGNOSIS — M65341 Trigger finger, right ring finger: Secondary | ICD-10-CM

## 2022-03-01 NOTE — Progress Notes (Signed)
Post-Op Visit Note   Patient: Mary Hernandez           Date of Birth: 1951/04/22           MRN: [id number] Visit Date: 03/01/2022 PCP: Leamon Arnt, MD   Assessment & Plan:  Chief Complaint:  Chief Complaint  Patient presents with   Right Hand - Routine Post Op   Visit Diagnoses:  1. Trigger finger, right ring finger   2. Trigger finger, right middle finger     Plan: Patient is 10 days s/p right middle and ring finger A1 pulley releases.  She is doing very well.  She lacks 1-2 cm from the [facility] with flexion but is working on this at home.  Her incision is well approximated with no surrounding erythema or induration.  Her sutures were removed.  She can follow up with me again as needed.   Follow-Up Instructions: No follow-ups on file.   Orders:  No orders of the defined types were placed in this encounter.  No orders of the defined types were placed in this encounter.   Imaging: No results found.  PMFS History: Patient Active Problem List   Diagnosis Date Noted   Trigger finger, right ring finger 01/29/2022   Trigger finger, right middle finger 01/29/2022   Influenza A (H1N1) 09/03/2021   Obesity, Class I, BMI 30-34.9 10/07/2019   Adjustment disorder with anxiety 10/07/2019   Notalgia paresthetica 07/01/2019   Insomnia due to medical condition 10/18/2018   Prediabetes 10/12/2018   History of rotator cuff syndrome with tear and adhesive capsulitis 09/11/2018   History of lateral epicondylitis of right elbow 06/25/2018   Diverticulosis 03/30/2018   GERD (gastroesophageal reflux disease) 03/30/2018   Mixed hyperlipidemia 03/01/2018   Acquired hypothyroidism 03/01/2018   Meniere disease 03/01/2018   Frequent PVCs 03/01/2018   Past Medical History:  Diagnosis Date   Acquired hypothyroidism 03/01/2018   hx of goiter; s/p thyroidectomy   Anxiety    Atrial premature depolarization    BPPV (benign paroxysmal positional vertigo) 03/01/2018   Colon polyps     Complication of anesthesia    takes longer to wake up   Essential hypertension 03/01/2018   denies hx - on diuretic for Meniere's and beta blocker for PVCs   Frequent PVCs 03/01/2018   Controlled on Flecainide, Acebutolol   Gallbladder problem    GERD (gastroesophageal reflux disease)    Hip pain    History of cardiac catheterization    Nuc 7/16:  normal perfusion; EF 76 // LHC 8/16:  normal coronary arteries   History of depression    History of dizziness    History of echocardiogram    Echo 03/2006:  Normal LVEF   History of stomach ulcers    HLD (hyperlipidemia)    Hypothyroidism    IBS (irritable bowel syndrome)    Infertility, female    Insulin resistance    Lower back pain    Meniere's disease    Notalgia paresthetica    PONV (postoperative nausea and vomiting)    Prediabetes    PVC (premature ventricular contraction)    Vertigo    Vitamin D deficiency     Family History  Problem Relation Age of Onset   Breast cancer Mother    Hyperlipidemia Mother    Thyroid disease Mother    Cancer Mother    Heart attack Father    Pulmonary fibrosis Father    Peripheral Artery Disease Father  s/p CEA   Heart disease Father    Hyperlipidemia Father    Early death Sister    Depression Brother    Hearing loss Brother    Diabetes Daughter    Heart disease Paternal Grandmother    Colon cancer Neg Hx    Stomach cancer Neg Hx     Past Surgical History:  Procedure Laterality Date   BLADDER SURGERY  1997   BREAST BIOPSY  2013   BREAST REDUCTION SURGERY  1999   CARDIAC CATHETERIZATION  05/2015   Carpel Tunnel Release     CHOLECYSTECTOMY  2011   COLONOSCOPY  08/20/2015   ESOPHAGOGASTRODUODENOSCOPY  01/13/2017   FOOT NEUROMA SURGERY  1986   REDUCTION MAMMAPLASTY     REFRACTIVE SURGERY     S/P Eye Right 1967   Muscle Clipped    THYROIDECTOMY  2012   TONSILLECTOMY  1975   TOTAL ABDOMINAL HYSTERECTOMY  1996   TRIGGER FINGER RELEASE Right 02/20/2022   Procedure: RIGHT  MIDDLE AND RING FINGER RELEASE TRIGGER FINGER/A-1 PULLEY;  Surgeon: Sherilyn Cooter, MD;  Location: [facility];  Service: Orthopedics;  Laterality: Right;   Social History   Occupational History   Occupation: Retired Educational psychologist  Tobacco Use   Smoking status: Never   Smokeless tobacco: Never  Vaping Use   Vaping Use: Never used  Substance and Sexual Activity   Alcohol use: Yes    Comment: very limited    Drug use: Never   Sexual activity: Not on file

## 2022-03-14 DIAGNOSIS — L821 Other seborrheic keratosis: Secondary | ICD-10-CM | POA: Diagnosis not present

## 2022-03-14 DIAGNOSIS — R208 Other disturbances of skin sensation: Secondary | ICD-10-CM | POA: Diagnosis not present

## 2022-03-14 DIAGNOSIS — D225 Melanocytic nevi of trunk: Secondary | ICD-10-CM | POA: Diagnosis not present

## 2022-03-14 DIAGNOSIS — D692 Other nonthrombocytopenic purpura: Secondary | ICD-10-CM | POA: Diagnosis not present

## 2022-03-18 DIAGNOSIS — M25511 Pain in right shoulder: Secondary | ICD-10-CM | POA: Diagnosis not present

## 2022-03-18 DIAGNOSIS — F430 Acute stress reaction: Secondary | ICD-10-CM | POA: Diagnosis not present

## 2022-03-18 DIAGNOSIS — M9901 Segmental and somatic dysfunction of cervical region: Secondary | ICD-10-CM | POA: Diagnosis not present

## 2022-03-18 DIAGNOSIS — M9907 Segmental and somatic dysfunction of upper extremity: Secondary | ICD-10-CM | POA: Diagnosis not present

## 2022-03-18 DIAGNOSIS — M9902 Segmental and somatic dysfunction of thoracic region: Secondary | ICD-10-CM | POA: Diagnosis not present

## 2022-03-18 DIAGNOSIS — M25512 Pain in left shoulder: Secondary | ICD-10-CM | POA: Diagnosis not present

## 2022-03-18 DIAGNOSIS — G44209 Tension-type headache, unspecified, not intractable: Secondary | ICD-10-CM | POA: Diagnosis not present

## 2022-03-18 DIAGNOSIS — M542 Cervicalgia: Secondary | ICD-10-CM | POA: Diagnosis not present

## 2022-03-18 DIAGNOSIS — M9908 Segmental and somatic dysfunction of rib cage: Secondary | ICD-10-CM | POA: Diagnosis not present

## 2022-03-25 DIAGNOSIS — E890 Postprocedural hypothyroidism: Secondary | ICD-10-CM | POA: Diagnosis not present

## 2022-03-25 DIAGNOSIS — Z8639 Personal history of other endocrine, nutritional and metabolic disease: Secondary | ICD-10-CM | POA: Diagnosis not present

## 2022-03-25 DIAGNOSIS — G8929 Other chronic pain: Secondary | ICD-10-CM | POA: Diagnosis not present

## 2022-03-25 DIAGNOSIS — E663 Overweight: Secondary | ICD-10-CM | POA: Diagnosis not present

## 2022-03-25 DIAGNOSIS — R7303 Prediabetes: Secondary | ICD-10-CM | POA: Diagnosis not present

## 2022-04-02 DIAGNOSIS — M9901 Segmental and somatic dysfunction of cervical region: Secondary | ICD-10-CM | POA: Diagnosis not present

## 2022-04-02 DIAGNOSIS — M65341 Trigger finger, right ring finger: Secondary | ICD-10-CM | POA: Diagnosis not present

## 2022-04-02 DIAGNOSIS — M9907 Segmental and somatic dysfunction of upper extremity: Secondary | ICD-10-CM | POA: Diagnosis not present

## 2022-04-02 DIAGNOSIS — M9908 Segmental and somatic dysfunction of rib cage: Secondary | ICD-10-CM | POA: Diagnosis not present

## 2022-04-02 DIAGNOSIS — M542 Cervicalgia: Secondary | ICD-10-CM | POA: Diagnosis not present

## 2022-04-03 ENCOUNTER — Encounter: Payer: Self-pay | Admitting: Internal Medicine

## 2022-04-03 ENCOUNTER — Other Ambulatory Visit: Payer: Self-pay | Admitting: Internal Medicine

## 2022-04-03 ENCOUNTER — Encounter: Payer: Self-pay | Admitting: Family Medicine

## 2022-04-04 ENCOUNTER — Other Ambulatory Visit: Payer: Self-pay

## 2022-04-17 DIAGNOSIS — M9902 Segmental and somatic dysfunction of thoracic region: Secondary | ICD-10-CM | POA: Diagnosis not present

## 2022-04-17 DIAGNOSIS — M9903 Segmental and somatic dysfunction of lumbar region: Secondary | ICD-10-CM | POA: Diagnosis not present

## 2022-04-17 DIAGNOSIS — M9906 Segmental and somatic dysfunction of lower extremity: Secondary | ICD-10-CM | POA: Diagnosis not present

## 2022-04-17 DIAGNOSIS — M9904 Segmental and somatic dysfunction of sacral region: Secondary | ICD-10-CM | POA: Diagnosis not present

## 2022-04-17 DIAGNOSIS — M25551 Pain in right hip: Secondary | ICD-10-CM | POA: Diagnosis not present

## 2022-04-17 DIAGNOSIS — M542 Cervicalgia: Secondary | ICD-10-CM | POA: Diagnosis not present

## 2022-04-17 DIAGNOSIS — M9908 Segmental and somatic dysfunction of rib cage: Secondary | ICD-10-CM | POA: Diagnosis not present

## 2022-04-30 DIAGNOSIS — I10 Essential (primary) hypertension: Secondary | ICD-10-CM | POA: Diagnosis not present

## 2022-04-30 DIAGNOSIS — F419 Anxiety disorder, unspecified: Secondary | ICD-10-CM | POA: Diagnosis not present

## 2022-04-30 DIAGNOSIS — E785 Hyperlipidemia, unspecified: Secondary | ICD-10-CM | POA: Diagnosis not present

## 2022-04-30 DIAGNOSIS — E890 Postprocedural hypothyroidism: Secondary | ICD-10-CM | POA: Diagnosis not present

## 2022-04-30 DIAGNOSIS — E663 Overweight: Secondary | ICD-10-CM | POA: Diagnosis not present

## 2022-04-30 DIAGNOSIS — G8929 Other chronic pain: Secondary | ICD-10-CM | POA: Diagnosis not present

## 2022-04-30 DIAGNOSIS — R7303 Prediabetes: Secondary | ICD-10-CM | POA: Diagnosis not present

## 2022-04-30 LAB — HEMOGLOBIN A1C: Hemoglobin A1C: 5.9

## 2022-05-01 ENCOUNTER — Other Ambulatory Visit: Payer: Self-pay | Admitting: Family Medicine

## 2022-05-01 DIAGNOSIS — Z1231 Encounter for screening mammogram for malignant neoplasm of breast: Secondary | ICD-10-CM

## 2022-05-14 DIAGNOSIS — M9901 Segmental and somatic dysfunction of cervical region: Secondary | ICD-10-CM | POA: Diagnosis not present

## 2022-05-14 DIAGNOSIS — M9903 Segmental and somatic dysfunction of lumbar region: Secondary | ICD-10-CM | POA: Diagnosis not present

## 2022-05-14 DIAGNOSIS — M9908 Segmental and somatic dysfunction of rib cage: Secondary | ICD-10-CM | POA: Diagnosis not present

## 2022-05-14 DIAGNOSIS — G4486 Cervicogenic headache: Secondary | ICD-10-CM | POA: Diagnosis not present

## 2022-05-14 DIAGNOSIS — M9902 Segmental and somatic dysfunction of thoracic region: Secondary | ICD-10-CM | POA: Diagnosis not present

## 2022-05-14 DIAGNOSIS — M25551 Pain in right hip: Secondary | ICD-10-CM | POA: Diagnosis not present

## 2022-05-14 DIAGNOSIS — M9904 Segmental and somatic dysfunction of sacral region: Secondary | ICD-10-CM | POA: Diagnosis not present

## 2022-05-14 DIAGNOSIS — M542 Cervicalgia: Secondary | ICD-10-CM | POA: Diagnosis not present

## 2022-05-14 DIAGNOSIS — M9907 Segmental and somatic dysfunction of upper extremity: Secondary | ICD-10-CM | POA: Diagnosis not present

## 2022-05-14 DIAGNOSIS — M9905 Segmental and somatic dysfunction of pelvic region: Secondary | ICD-10-CM | POA: Diagnosis not present

## 2022-05-14 DIAGNOSIS — M9906 Segmental and somatic dysfunction of lower extremity: Secondary | ICD-10-CM | POA: Diagnosis not present

## 2022-05-14 DIAGNOSIS — M545 Low back pain, unspecified: Secondary | ICD-10-CM | POA: Diagnosis not present

## 2022-05-15 ENCOUNTER — Encounter (INDEPENDENT_AMBULATORY_CARE_PROVIDER_SITE_OTHER): Payer: Self-pay

## 2022-05-16 ENCOUNTER — Telehealth: Payer: Self-pay | Admitting: Pharmacist

## 2022-05-16 NOTE — Progress Notes (Signed)
Chronic Care Management Pharmacy Assistant   Name: Mirinda Monte  MRN: [id number] DOB: Feb 23, 1951   Reason for Encounter: Medication Coordination Call    Recent office visits:  None  Recent consult visits:  03/01/2022 [facility] (Orthopedics) Sherilyn Cooter, MD; no medication changes indicated.  Hospital visits:  None since last medication coordination call  Medications: Outpatient Encounter Medications as of 05/16/2022  Medication Sig   acebutolol (SECTRAL) 200 MG capsule TAKE ONE CAPSULE BY MOUTH DAILY   Calcium Citrate-Vitamin D 315-250 MG-UNIT TABS Take by mouth.   diclofenac Sodium (VOLTAREN) 1 % GEL Apply 2 g topically 4 (four) times daily.   EVENING PRIMROSE OIL PO Take 1,500 mg by mouth 2 (two) times daily.   flecainide (TAMBOCOR) 50 MG tablet TAKE 1 AND 1/2 TABLET BY MOUTH TWO TIMES A DAY   fluticasone (FLONASE) 50 MCG/ACT nasal spray Place 1 spray into both nostrils daily.   Glucosamine-Chondroit-Vit C-Mn (GLUCOSAMINE 1500 COMPLEX) CAPS Take by mouth.   glycopyrrolate (ROBINUL) 1 MG tablet Take 1 tablet (1 mg total) by mouth 2 (two) times daily.   levothyroxine (SYNTHROID) 100 MCG tablet Take 1 tablet (100 mcg total) by mouth daily.   MAGNESIUM PO Take by mouth.   montelukast (SINGULAIR) 10 MG tablet Take 1 tablet (10 mg total) by mouth at bedtime.   naltrexone (DEPADE) 50 MG tablet Take 50 mg by mouth in the morning and at bedtime.   omega-3 acid ethyl esters (LOVAZA) 1 g capsule Take 1 g by mouth 2 (two) times daily.   ondansetron (ZOFRAN-ODT) 4 MG disintegrating tablet Take 4 mg by mouth every 8 (eight) hours as needed for nausea or vomiting.   Potassium Chloride ER 20 MEQ TBCR Take 20 mEq by mouth daily. Please keep upcoming appt in January 2023 with Dr. Caryl Comes before anymore refills. Thank you   pregabalin (LYRICA) 75 MG capsule Take 75 mg by mouth 2 (two) times daily.   Probiotic Product (PROBIOTIC-10 PO) Take 1 capsule by mouth daily.   rosuvastatin (CRESTOR) 10  MG tablet Take 1 tablet (10 mg total) by mouth daily.   Semaglutide, 2 MG/DOSE, (OZEMPIC, 2 MG/DOSE,) 8 MG/3ML SOPN Inject 2 mg into the skin once a week.   sertraline (ZOLOFT) 50 MG tablet Take 2 tablets (100 mg total) by mouth daily.   TART CHERRY PO Take 1 tablet by mouth daily.   triamterene-hydrochlorothiazide (MAXZIDE-25) 37.5-25 MG tablet Take 0.5 tablets by mouth daily.   Turmeric 400 MG CAPS Take 3 capsules by mouth daily.   UNABLE TO FIND Inject 2 mg into the skin once a week. Med Name: Ozempic 2 mg dose (8 mg/3 mL subcutaneous pen injector)   No facility-administered encounter medications on file as of 05/16/2022.   Reviewed chart for medication changes ahead of medication coordination call.  BP Readings from Last 3 Encounters:  02/20/22 119/65  01/29/22 98/62  01/01/22 93/60    Lab Results  Component Value Date   HGBA1C 5.8 (H) 10/23/2021     Patient obtains medications through Adherence Packaging  90 Days   Last adherence delivery included:  Fish Oil 1200 mg Take two capsules every morning and take one capsule every evening Evening Primrose 1000 Take one capsule every morning Calcium Citrate 315 Take one tab every morning and take one tab every evening Glucosamine Sulfate 500-400 Take one tab every morning and take one tab every evening Claritin 10 mg Tablet Take one tab every evening Tart Cherry 1200 mg Tab Take  one tab every   Patient is due for next adherence delivery on: 05/28/2022. Called patient and reviewed medications and coordinated delivery.  **Patient declined delivery. Patient states she has a whole box left of medication left. Patient states she will not need any of these medications until the middle of September.  Care Gaps: Medicare Annual Wellness: Completed 11/16/2021 Hemoglobin A1C: 5.8% on 10/23/2021 Colonoscopy: Next due on 08/11/2023 Dexa Scan: Next due on 05/29/2023 Mammogram: Next due on 06/13/2022  Future Appointments  Date Time Provider  [place]  06/18/2022 10:30 AM [facility]   April D Calhoun, [place] Pharmacist Assistant [phone]

## 2022-05-21 ENCOUNTER — Other Ambulatory Visit: Payer: Self-pay | Admitting: Internal Medicine

## 2022-05-29 DIAGNOSIS — E890 Postprocedural hypothyroidism: Secondary | ICD-10-CM | POA: Diagnosis not present

## 2022-05-29 DIAGNOSIS — G8929 Other chronic pain: Secondary | ICD-10-CM | POA: Diagnosis not present

## 2022-05-29 DIAGNOSIS — E663 Overweight: Secondary | ICD-10-CM | POA: Diagnosis not present

## 2022-05-29 DIAGNOSIS — Z6827 Body mass index (BMI) 27.0-27.9, adult: Secondary | ICD-10-CM | POA: Diagnosis not present

## 2022-05-29 DIAGNOSIS — R7303 Prediabetes: Secondary | ICD-10-CM | POA: Diagnosis not present

## 2022-06-04 DIAGNOSIS — M9904 Segmental and somatic dysfunction of sacral region: Secondary | ICD-10-CM | POA: Diagnosis not present

## 2022-06-04 DIAGNOSIS — M9906 Segmental and somatic dysfunction of lower extremity: Secondary | ICD-10-CM | POA: Diagnosis not present

## 2022-06-04 DIAGNOSIS — M545 Low back pain, unspecified: Secondary | ICD-10-CM | POA: Diagnosis not present

## 2022-06-04 DIAGNOSIS — M9905 Segmental and somatic dysfunction of pelvic region: Secondary | ICD-10-CM | POA: Diagnosis not present

## 2022-06-04 DIAGNOSIS — M9901 Segmental and somatic dysfunction of cervical region: Secondary | ICD-10-CM | POA: Diagnosis not present

## 2022-06-04 DIAGNOSIS — M542 Cervicalgia: Secondary | ICD-10-CM | POA: Diagnosis not present

## 2022-06-04 DIAGNOSIS — M25552 Pain in left hip: Secondary | ICD-10-CM | POA: Diagnosis not present

## 2022-06-04 DIAGNOSIS — M9903 Segmental and somatic dysfunction of lumbar region: Secondary | ICD-10-CM | POA: Diagnosis not present

## 2022-06-06 ENCOUNTER — Other Ambulatory Visit: Payer: Self-pay | Admitting: Student

## 2022-06-06 DIAGNOSIS — H8109 Meniere's disease, unspecified ear: Secondary | ICD-10-CM

## 2022-06-07 ENCOUNTER — Telehealth: Payer: Self-pay | Admitting: Pharmacist

## 2022-06-07 NOTE — Progress Notes (Unsigned)
Chronic Care Management Pharmacy Assistant   Name: Mary Hernandez  MRN: [id number] DOB: 04/25/1951   Reason for Encounter: Medication Coordination Call    Recent office visits:  None  Recent consult visits:  None  Hospital visits:  None since last medication coordination call  Medications: Outpatient Encounter Medications as of 06/07/2022  Medication Sig   acebutolol (SECTRAL) 200 MG capsule TAKE ONE CAPSULE BY MOUTH DAILY   Calcium Citrate-Vitamin D 315-250 MG-UNIT TABS Take by mouth.   diclofenac Sodium (VOLTAREN) 1 % GEL Apply 2 g topically 4 (four) times daily.   EVENING PRIMROSE OIL PO Take 1,500 mg by mouth 2 (two) times daily.   flecainide (TAMBOCOR) 50 MG tablet TAKE 1 AND 1/2 TABLET BY MOUTH TWO TIMES A DAY   fluticasone (FLONASE) 50 MCG/ACT nasal spray Place 1 spray into both nostrils daily.   Glucosamine-Chondroit-Vit C-Mn (GLUCOSAMINE 1500 COMPLEX) CAPS Take by mouth.   glycopyrrolate (ROBINUL) 1 MG tablet Take 1 tablet (1 mg total) by mouth 2 (two) times daily.   levothyroxine (SYNTHROID) 100 MCG tablet Take 1 tablet (100 mcg total) by mouth daily.   MAGNESIUM PO Take by mouth.   montelukast (SINGULAIR) 10 MG tablet Take 1 tablet (10 mg total) by mouth at bedtime.   naltrexone (DEPADE) 50 MG tablet Take 50 mg by mouth in the morning and at bedtime.   omega-3 acid ethyl esters (LOVAZA) 1 g capsule Take 1 g by mouth 2 (two) times daily.   ondansetron (ZOFRAN-ODT) 4 MG disintegrating tablet Take 4 mg by mouth every 8 (eight) hours as needed for nausea or vomiting.   Potassium Chloride ER 20 MEQ TBCR Take 20 mEq by mouth daily. Please keep upcoming appt in January 2023 with Dr. Caryl Comes before anymore refills. Thank you   pregabalin (LYRICA) 75 MG capsule Take 75 mg by mouth 2 (two) times daily.   Probiotic Product (PROBIOTIC-10 PO) Take 1 capsule by mouth daily.   rosuvastatin (CRESTOR) 10 MG tablet Take 1 tablet (10 mg total) by mouth daily.   Semaglutide, 2  MG/DOSE, (OZEMPIC, 2 MG/DOSE,) 8 MG/3ML SOPN Inject 2 mg into the skin once a week.   sertraline (ZOLOFT) 50 MG tablet Take 2 tablets (100 mg total) by mouth daily.   TART CHERRY PO Take 1 tablet by mouth daily.   triamterene-hydrochlorothiazide (MAXZIDE-25) 37.5-25 MG tablet Take 0.5 tablets by mouth daily.   Turmeric 400 MG CAPS Take 3 capsules by mouth daily.   UNABLE TO FIND Inject 2 mg into the skin once a week. Med Name: Ozempic 2 mg dose (8 mg/3 mL subcutaneous pen injector)   No facility-administered encounter medications on file as of 06/07/2022.   Reviewed chart for medication changes ahead of medication coordination call.  No OVs, Consults, or hospital visits since last care coordination call/Pharmacist visit.  No medication changes indicated.  BP Readings from Last 3 Encounters:  02/20/22 119/65  01/29/22 98/62  01/01/22 93/60    Lab Results  Component Value Date   HGBA1C 5.8 (H) 10/23/2021     Patient obtains medications through Adherence Packaging  90 Days   Last adherence delivery included: Fish Oil 1200 mg Take two capsules every morning and take one capsule every evening Evening Primrose 1000 Take one capsule every morning Calcium Citrate 315 Take one tab every morning and take one tab every evening Glucosamine Sulfate 500-400 Take one tab every morning and take one tab every evening Claritin 10 mg Tablet Take one tab  every evening Tart Cherry 1200 mg Tab Take one tab every   Patient is due for next adherence delivery on: 06/19/2022. Called patient and reviewed medications and coordinated delivery.  This delivery to include:    **Patient declines this order, she states she will no longer be ordering with [facility].**    Care Gaps: Medicare Annual Wellness: Completed 11/16/2021 Hemoglobin A1C: 5.8% on 10/23/2021 Colonoscopy: Next due on 08/11/2023 Dexa Scan: Next due on 05/29/2023 Mammogram: Next due on 06/13/2022  Future Appointments  Date Time  Provider [place]  06/14/2022  4:40 PM [facility]   April D Calhoun, [place] Pharmacist Assistant [phone]

## 2022-06-14 ENCOUNTER — Telehealth: Payer: PPO | Admitting: Physician Assistant

## 2022-06-14 ENCOUNTER — Ambulatory Visit: Payer: PPO

## 2022-06-14 DIAGNOSIS — U071 COVID-19: Secondary | ICD-10-CM | POA: Diagnosis not present

## 2022-06-14 MED ORDER — NIRMATRELVIR/RITONAVIR (PAXLOVID)TABLET
3.0000 | ORAL_TABLET | Freq: Two times a day (BID) | ORAL | 0 refills | Status: AC
Start: 2022-06-14 — End: 2022-06-19

## 2022-06-14 NOTE — Progress Notes (Signed)
Virtual Visit Consent   Mary Hernandez, you are scheduled for a virtual visit with a [facility] provider today. Just as with appointments in the office, your consent must be obtained to participate. Your consent will be active for this visit and any virtual visit you may have with one of our providers in the next 365 days. If you have a MyChart account, a copy of this consent can be sent to you electronically.  As this is a virtual visit, video technology does not allow for your provider to perform a traditional examination. This may limit your provider's ability to fully assess your condition. If your provider identifies any concerns that need to be evaluated in person or the need to arrange testing (such as labs, EKG, etc.), we will make arrangements to do so. Although advances in technology are sophisticated, we cannot ensure that it will always work on either your end or our end. If the connection with a video visit is poor, the visit may have to be switched to a telephone visit. With either a video or telephone visit, we are not always able to ensure that we have a secure connection.  By engaging in this virtual visit, you consent to the provision of healthcare and authorize for your insurance to be billed (if applicable) for the services provided during this visit. Depending on your insurance coverage, you may receive a charge related to this service.  I need to obtain your verbal consent now. Are you willing to proceed with your visit today? Mary Hernandez has provided verbal consent on 06/14/2022 for a virtual visit (video or telephone). Mar Daring, PA-C  Date: 06/14/2022 4:15 PM  Virtual Visit via Video Note   I, Mar Daring, connected with  Mary Hernandez  ([id number], 05-28-1951) on 06/14/22 at  4:00 PM EDT by a video-enabled telemedicine application and verified that I am speaking with the correct person using two identifiers.  Location: Patient: Virtual Visit  Location Patient: Home Provider: Virtual Visit Location Provider: Home Office   I discussed the limitations of evaluation and management by telemedicine and the availability of in person appointments. The patient expressed understanding and agreed to proceed.    History of Present Illness: Mary Hernandez is a 71 y.o. who identifies as a female who was assigned female at birth, and is being seen today for Covid 99.  HPI: URI  This is a new problem. The current episode started today (Husband is positive; she tested negative this morning, but symptoms started about midday; has not re-tested yet). The problem has been gradually worsening. There has been no fever. Associated symptoms include chest pain (with coughing), congestion, coughing (dry), rhinorrhea, sinus pain and a sore throat. Pertinent negatives include no diarrhea, ear pain, headaches, nausea, plugged ear sensation or vomiting. She has tried nothing (montelukast for allergies) for the symptoms. The treatment provided no relief.    BP normally is 100/60  Problems:  Patient Active Problem List   Diagnosis Date Noted   Trigger finger, right ring finger 01/29/2022   Trigger finger, right middle finger 01/29/2022   Influenza A (H1N1) 09/03/2021   Obesity, Class I, BMI 30-34.9 10/07/2019   Adjustment disorder with anxiety 10/07/2019   Notalgia paresthetica 07/01/2019   Insomnia due to medical condition 10/18/2018   Prediabetes 10/12/2018   History of rotator cuff syndrome with tear and adhesive capsulitis 09/11/2018   History of lateral epicondylitis of right elbow 06/25/2018   Diverticulosis 03/30/2018  GERD (gastroesophageal reflux disease) 03/30/2018   Mixed hyperlipidemia 03/01/2018   Acquired hypothyroidism 03/01/2018   Meniere disease 03/01/2018   Frequent PVCs 03/01/2018    Allergies:  Allergies  Allergen Reactions   Seasonal Ic [Cholestatin]    Adhesive [Tape] Rash   Medications:  Current Outpatient Medications:     nirmatrelvir/ritonavir EUA (PAXLOVID) 20 x 150 MG & 10 x '100MG'$  TABS, Take 3 tablets by mouth 2 (two) times daily for 5 days. (Take nirmatrelvir 150 mg two tablets twice daily for 5 days and ritonavir 100 mg one tablet twice daily for 5 days) Patient GFR is greater than 60, Disp: 30 tablet, Rfl: 0   acebutolol (SECTRAL) 200 MG capsule, TAKE ONE CAPSULE BY MOUTH DAILY, Disp: 90 capsule, Rfl: 1   Calcium Citrate-Vitamin D 315-250 MG-UNIT TABS, Take by mouth., Disp: , Rfl:    diclofenac Sodium (VOLTAREN) 1 % GEL, Apply 2 g topically 4 (four) times daily., Disp: 350 g, Rfl: 5   EVENING PRIMROSE OIL PO, Take 1,500 mg by mouth 2 (two) times daily., Disp: , Rfl:    flecainide (TAMBOCOR) 50 MG tablet, TAKE 1 AND 1/2 TABLET BY MOUTH TWO TIMES A DAY, Disp: 270 tablet, Rfl: 1   fluticasone (FLONASE) 50 MCG/ACT nasal spray, Place 1 spray into both nostrils daily., Disp: 16 g, Rfl: 11   Glucosamine-Chondroit-Vit C-Mn (GLUCOSAMINE 1500 COMPLEX) CAPS, Take by mouth., Disp: , Rfl:    glycopyrrolate (ROBINUL) 1 MG tablet, Take 1 tablet (1 mg total) by mouth 2 (two) times daily., Disp: 180 tablet, Rfl: 3   levothyroxine (SYNTHROID) 100 MCG tablet, Take 1 tablet (100 mcg total) by mouth daily., Disp: 90 tablet, Rfl: 3   MAGNESIUM PO, Take by mouth., Disp: , Rfl:    montelukast (SINGULAIR) 10 MG tablet, Take 1 tablet (10 mg total) by mouth at bedtime., Disp: 90 tablet, Rfl: 3   naltrexone (DEPADE) 50 MG tablet, Take 50 mg by mouth in the morning and at bedtime., Disp: , Rfl:    omega-3 acid ethyl esters (LOVAZA) 1 g capsule, Take 1 g by mouth 2 (two) times daily., Disp: , Rfl:    ondansetron (ZOFRAN-ODT) 4 MG disintegrating tablet, Take 4 mg by mouth every 8 (eight) hours as needed for nausea or vomiting., Disp: , Rfl:    Potassium Chloride ER 20 MEQ TBCR, Take 1 tablet by mouth daily., Disp: 90 tablet, Rfl: 1   pregabalin (LYRICA) 75 MG capsule, Take 75 mg by mouth 2 (two) times daily., Disp: , Rfl:    Probiotic  Product (PROBIOTIC-10 PO), Take 1 capsule by mouth daily., Disp: , Rfl:    rosuvastatin (CRESTOR) 10 MG tablet, Take 1 tablet (10 mg total) by mouth daily., Disp: 90 tablet, Rfl: 3   Semaglutide, 2 MG/DOSE, (OZEMPIC, 2 MG/DOSE,) 8 MG/3ML SOPN, Inject 2 mg into the skin once a week., Disp: 9 mL, Rfl: 1   sertraline (ZOLOFT) 50 MG tablet, Take 2 tablets (100 mg total) by mouth daily., Disp: 180 tablet, Rfl: 3   TART CHERRY PO, Take 1 tablet by mouth daily., Disp: , Rfl:    triamterene-hydrochlorothiazide (MAXZIDE-25) 37.5-25 MG tablet, Take 0.5 tablets by mouth daily., Disp: 45 tablet, Rfl: 3   Turmeric 400 MG CAPS, Take 3 capsules by mouth daily., Disp: , Rfl:    UNABLE TO FIND, Inject 2 mg into the skin once a week. Med Name: Ozempic 2 mg dose (8 mg/3 mL subcutaneous pen injector), Disp: 9 mL, Rfl: 0  Observations/Objective: Patient  is well-developed, well-nourished in no acute distress.  Resting comfortably at home.  Head is normocephalic, atraumatic.  No labored breathing.  Speech is clear and coherent with logical content.  Patient is alert and oriented at baseline.    Assessment and Plan: 1. COVID-19 - nirmatrelvir/ritonavir EUA (PAXLOVID) 20 x 150 MG & 10 x '100MG'$  TABS; Take 3 tablets by mouth 2 (two) times daily for 5 days. (Take nirmatrelvir 150 mg two tablets twice daily for 5 days and ritonavir 100 mg one tablet twice daily for 5 days) Patient GFR is greater than 60  Dispense: 30 tablet; Refill: 0  - Continue OTC symptomatic management of choice - Will send OTC vitamins and supplement information through AVS - Paxlovid prescribed - Patient enrolled in MyChart symptom monitoring - Push fluids - Rest as needed - Discussed return precautions and when to seek in-person evaluation, sent via AVS as well   Follow Up Instructions: I discussed the assessment and treatment plan with the patient. The patient was provided an opportunity to ask questions and all were answered. The patient  agreed with the plan and demonstrated an understanding of the instructions.  A copy of instructions were sent to the patient via MyChart unless otherwise noted below.    The patient was advised to call back or seek an in-person evaluation if the symptoms worsen or if the condition fails to improve as anticipated.  Time:  I spent 15 minutes with the patient via telehealth technology discussing the above problems/concerns.    Mar Daring, PA-C

## 2022-06-14 NOTE — Patient Instructions (Signed)
[name], thank you for joining Mar Daring, PA-C for today's virtual visit.  While this provider is not your primary care provider (PCP), if your PCP is located in our provider database this encounter information will be shared with them immediately following your visit.  Consent: (Patient) Mary Hernandez provided verbal consent for this virtual visit at the beginning of the encounter.  Current Medications:  Current Outpatient Medications:    nirmatrelvir/ritonavir EUA (PAXLOVID) 20 x 150 MG & 10 x '100MG'$  TABS, Take 3 tablets by mouth 2 (two) times daily for 5 days. (Take nirmatrelvir 150 mg two tablets twice daily for 5 days and ritonavir 100 mg one tablet twice daily for 5 days) Patient GFR is greater than 60, Disp: 30 tablet, Rfl: 0   acebutolol (SECTRAL) 200 MG capsule, TAKE ONE CAPSULE BY MOUTH DAILY, Disp: 90 capsule, Rfl: 1   Calcium Citrate-Vitamin D 315-250 MG-UNIT TABS, Take by mouth., Disp: , Rfl:    diclofenac Sodium (VOLTAREN) 1 % GEL, Apply 2 g topically 4 (four) times daily., Disp: 350 g, Rfl: 5   EVENING PRIMROSE OIL PO, Take 1,500 mg by mouth 2 (two) times daily., Disp: , Rfl:    flecainide (TAMBOCOR) 50 MG tablet, TAKE 1 AND 1/2 TABLET BY MOUTH TWO TIMES A DAY, Disp: 270 tablet, Rfl: 1   fluticasone (FLONASE) 50 MCG/ACT nasal spray, Place 1 spray into both nostrils daily., Disp: 16 g, Rfl: 11   Glucosamine-Chondroit-Vit C-Mn (GLUCOSAMINE 1500 COMPLEX) CAPS, Take by mouth., Disp: , Rfl:    glycopyrrolate (ROBINUL) 1 MG tablet, Take 1 tablet (1 mg total) by mouth 2 (two) times daily., Disp: 180 tablet, Rfl: 3   levothyroxine (SYNTHROID) 100 MCG tablet, Take 1 tablet (100 mcg total) by mouth daily., Disp: 90 tablet, Rfl: 3   MAGNESIUM PO, Take by mouth., Disp: , Rfl:    montelukast (SINGULAIR) 10 MG tablet, Take 1 tablet (10 mg total) by mouth at bedtime., Disp: 90 tablet, Rfl: 3   naltrexone (DEPADE) 50 MG tablet, Take 50 mg by mouth in the morning and at  bedtime., Disp: , Rfl:    omega-3 acid ethyl esters (LOVAZA) 1 g capsule, Take 1 g by mouth 2 (two) times daily., Disp: , Rfl:    ondansetron (ZOFRAN-ODT) 4 MG disintegrating tablet, Take 4 mg by mouth every 8 (eight) hours as needed for nausea or vomiting., Disp: , Rfl:    Potassium Chloride ER 20 MEQ TBCR, Take 1 tablet by mouth daily., Disp: 90 tablet, Rfl: 1   pregabalin (LYRICA) 75 MG capsule, Take 75 mg by mouth 2 (two) times daily., Disp: , Rfl:    Probiotic Product (PROBIOTIC-10 PO), Take 1 capsule by mouth daily., Disp: , Rfl:    rosuvastatin (CRESTOR) 10 MG tablet, Take 1 tablet (10 mg total) by mouth daily., Disp: 90 tablet, Rfl: 3   Semaglutide, 2 MG/DOSE, (OZEMPIC, 2 MG/DOSE,) 8 MG/3ML SOPN, Inject 2 mg into the skin once a week., Disp: 9 mL, Rfl: 1   sertraline (ZOLOFT) 50 MG tablet, Take 2 tablets (100 mg total) by mouth daily., Disp: 180 tablet, Rfl: 3   TART CHERRY PO, Take 1 tablet by mouth daily., Disp: , Rfl:    triamterene-hydrochlorothiazide (MAXZIDE-25) 37.5-25 MG tablet, Take 0.5 tablets by mouth daily., Disp: 45 tablet, Rfl: 3   Turmeric 400 MG CAPS, Take 3 capsules by mouth daily., Disp: , Rfl:    UNABLE TO FIND, Inject 2 mg into the skin once a week.  Med Name: Ozempic 2 mg dose (8 mg/3 mL subcutaneous pen injector), Disp: 9 mL, Rfl: 0   Medications ordered in this encounter:  Meds ordered this encounter  Medications   nirmatrelvir/ritonavir EUA (PAXLOVID) 20 x 150 MG & 10 x '100MG'$  TABS    Sig: Take 3 tablets by mouth 2 (two) times daily for 5 days. (Take nirmatrelvir 150 mg two tablets twice daily for 5 days and ritonavir 100 mg one tablet twice daily for 5 days) Patient GFR is greater than 60    Dispense:  30 tablet    Refill:  0    Order Specific Question:   Supervising Provider    Answer:   Noemi Chapel [[id number]]     *If you need refills on other medications prior to your next appointment, please contact your pharmacy*  Follow-Up: Call back or seek an in-person  evaluation if the symptoms worsen or if the condition fails to improve as anticipated.  Other Instructions  Paxlovid (Nirmatrelvir; Ritonavir Tablets) What is this medication? NIRMATRELVIR; RITONAVIR (NIR ma TREL vir; ri TOE na veer) treats mild to moderate COVID-19. It may help people who are at high risk of developing severe illness. This medication works by limiting the spread of the virus in your body. The FDA has allowed the emergency use of this medication. This medicine may be used for other purposes; ask your health care provider or pharmacist if you have questions. COMMON BRAND NAME(S): PAXLOVID What should I tell my care team before I take this medication? They need to know if you have any of these conditions: Any allergies Any serious illness Kidney disease Liver disease An unusual or allergic reaction to nirmatrelvir, ritonavir, other medications, foods, dyes, or preservatives Pregnant or trying to get pregnant Breast-feeding How should I use this medication? This product contains 2 different medications that are packaged together. For the standard dose, take 2 pink tablets of nirmatrelvir with 1 white tablet of ritonavir (3 tablets total) by mouth with water twice daily. Talk to your care team if you have kidney disease. You may need a different dose. Swallow the tablets whole. You can take it with or without food. If it upsets your stomach, take it with food. Take all of this medication unless your care team tells you to stop it early. Keep taking it even if you think you are better. Talk to your care team about the use of this medication in children. While it may be prescribed for children as young as 12 years for selected conditions, precautions do apply. Overdosage: If you think you have taken too much of this medicine contact a poison control center or emergency room at once. NOTE: This medicine is only for you. Do not share this medicine with others. What if I miss a  dose? If you miss a dose, take it as soon as you can unless it is more than 8 hours late. If it is more than 8 hours late, skip the missed dose. Take the next dose at the normal time. Do not take extra or 2 doses at the same time to make up for the missed dose. What may interact with this medication? Do not take this medication with any of the following medications: Alfuzosin Certain medications for anxiety or sleep like midazolam, triazolam Certain medications for cancer like apalutamide, enzalutamide Certain medications for cholesterol like lovastatin, simvastatin Certain medications for irregular heart beat like amiodarone, dronedarone, flecainide, propafenone, quinidine Certain medications for pain like meperidine, piroxicam Certain medications  for psychotic disorders like clozapine, lurasidone, pimozide Certain medications for seizures like carbamazepine, phenobarbital, phenytoin Colchicine Eletriptan Eplerenone Ergot alkaloids like dihydroergotamine, ergonovine, ergotamine, methylergonovine Finerenone Flibanserin Ivabradine Lomitapide Naloxegol Ranolazine Rifampin Sildenafil Silodosin St. John's Wort Tolvaptan Ubrogepant Voclosporin This medication may also interact with the following medications: Bedaquiline Birth control pills Bosentan Certain antibiotics like erythromycin or clarithromycin Certain medications for blood pressure like amlodipine, diltiazem, felodipine, nicardipine, nifedipine Certain medications for cancer like abemaciclib, ceritinib, dasatinib, encorafenib, ibrutinib, ivosidenib, neratinib, nilotinib, venetoclax, vinblastine, vincristine Certain medications for cholesterol like atorvastatin, rosuvastatin Certain medications for depression like bupropion, trazodone Certain medications for fungal infections like isavuconazonium, itraconazole, ketoconazole, voriconazole Certain medications for hepatitis C like elbasvir; grazoprevir, dasabuvir; ombitasvir;  paritaprevir; ritonavir, glecaprevir; pibrentasvir, sofosbuvir; velpatasvir; voxilaprevir Certain medications for HIV or AIDS Certain medications for irregular heartbeat like lidocaine Certain medications that treat or prevent blood clots like rivaroxaban, warfarin Digoxin Fentanyl Medications that lower your chance of fighting infection like cyclosporine, sirolimus, tacrolimus Methadone Quetiapine Rifabutin Salmeterol Steroid medications like betamethasone, budesonide, ciclesonide, dexamethasone, fluticasone, methylprednisolone, mometasone, triamcinolone This list may not describe all possible interactions. Give your health care provider a list of all the medicines, herbs, non-prescription drugs, or dietary supplements you use. Also tell them if you smoke, drink alcohol, or use illegal drugs. Some items may interact with your medicine. What should I watch for while using this medication? Your condition will be monitored carefully while you are receiving this medication. Visit your care team for regular checkups. Tell your care team if your symptoms do not start to get better or if they get worse. If you have untreated HIV infection, this medication may lead to some HIV medications not working as well in the future. Birth control may not work properly while you are taking this medication. Talk to your care team about using an extra method of birth control. What side effects may I notice from receiving this medication? Side effects that you should report to your care team as soon as possible: Allergic reactions--skin rash, itching, hives, swelling of the face, lips, tongue, or throat Liver injury--right upper belly pain, loss of appetite, nausea, light-colored stool, dark yellow or brown urine, yellowing skin or eyes, unusual weakness or fatigue Redness, blistering, peeling, or loosening of the skin, including inside the mouth Side effects that usually do not require medical attention (report  these to your care team if they continue or are bothersome): Change in taste Diarrhea General discomfort and fatigue Increase in blood pressure Muscle pain Nausea Stomach pain This list may not describe all possible side effects. Call your doctor for medical advice about side effects. You may report side effects to FDA at 1-800-FDA-1088. Where should I keep my medication? Keep out of the reach of children and pets. Store at room temperature between 20 and 25 degrees C (68 and 77 degrees F). Get rid of any unused medication after the expiration date. To get rid of medications that are no longer needed or have expired: Take the medication to a medication take-back program. Check with your pharmacy or law enforcement to find a location. If you cannot return the medication, check the label or package insert to see if the medication should be thrown out in the garbage or flushed down the toilet. If you are not sure, ask your care team. If it is safe to put it in the trash, take the medication out of the container. Mix the medication with cat litter, dirt, coffee grounds, or other unwanted substance. Seal  the mixture in a bag or container. Put it in the trash. NOTE: This sheet is a summary. It may not cover all possible information. If you have questions about this medicine, talk to your doctor, pharmacist, or health care provider.  2023 Elsevier/Gold Standard (2020-10-02 00:00:00)    If you have been instructed to have an in-person evaluation today at a local Urgent Care facility, please use the link below. It will take you to a list of all of our available [facility], including address, phone number and hours of operation. Please do not delay care.  [facility]  If you or a family member do not have a primary care provider, use the link below to schedule a visit and establish care. When you choose a [place] [facility] primary care physician or advanced practice provider, you  gain a long-term partner in health. Find a Primary Care Provider  Learn more about [facility]'s in-office and virtual care options: [facility]

## 2022-06-16 ENCOUNTER — Encounter: Payer: Self-pay | Admitting: Family Medicine

## 2022-06-18 ENCOUNTER — Ambulatory Visit: Payer: PPO

## 2022-06-24 DIAGNOSIS — M542 Cervicalgia: Secondary | ICD-10-CM | POA: Diagnosis not present

## 2022-06-24 DIAGNOSIS — M25511 Pain in right shoulder: Secondary | ICD-10-CM | POA: Diagnosis not present

## 2022-06-24 DIAGNOSIS — M9900 Segmental and somatic dysfunction of head region: Secondary | ICD-10-CM | POA: Diagnosis not present

## 2022-06-24 DIAGNOSIS — M9903 Segmental and somatic dysfunction of lumbar region: Secondary | ICD-10-CM | POA: Diagnosis not present

## 2022-06-24 DIAGNOSIS — M9904 Segmental and somatic dysfunction of sacral region: Secondary | ICD-10-CM | POA: Diagnosis not present

## 2022-06-24 DIAGNOSIS — M9906 Segmental and somatic dysfunction of lower extremity: Secondary | ICD-10-CM | POA: Diagnosis not present

## 2022-06-24 DIAGNOSIS — M79672 Pain in left foot: Secondary | ICD-10-CM | POA: Diagnosis not present

## 2022-06-24 DIAGNOSIS — M25551 Pain in right hip: Secondary | ICD-10-CM | POA: Diagnosis not present

## 2022-06-24 DIAGNOSIS — M9908 Segmental and somatic dysfunction of rib cage: Secondary | ICD-10-CM | POA: Diagnosis not present

## 2022-06-24 DIAGNOSIS — M25512 Pain in left shoulder: Secondary | ICD-10-CM | POA: Diagnosis not present

## 2022-06-24 DIAGNOSIS — M9902 Segmental and somatic dysfunction of thoracic region: Secondary | ICD-10-CM | POA: Diagnosis not present

## 2022-06-24 DIAGNOSIS — M9901 Segmental and somatic dysfunction of cervical region: Secondary | ICD-10-CM | POA: Diagnosis not present

## 2022-07-01 ENCOUNTER — Encounter: Payer: Self-pay | Admitting: *Deleted

## 2022-07-04 ENCOUNTER — Ambulatory Visit
Admission: RE | Admit: 2022-07-04 | Discharge: 2022-07-04 | Disposition: A | Discharge status: Home / Self Care | Payer: PPO | Source: Ambulatory Visit | Attending: Family Medicine | Admitting: Family Medicine

## 2022-07-04 DIAGNOSIS — Z1231 Encounter for screening mammogram for malignant neoplasm of breast: Secondary | ICD-10-CM

## 2022-07-05 ENCOUNTER — Other Ambulatory Visit: Payer: Self-pay | Admitting: Family Medicine

## 2022-07-05 ENCOUNTER — Encounter: Payer: Self-pay | Admitting: Family Medicine

## 2022-07-05 DIAGNOSIS — M25551 Pain in right hip: Secondary | ICD-10-CM | POA: Diagnosis not present

## 2022-07-05 DIAGNOSIS — M79672 Pain in left foot: Secondary | ICD-10-CM | POA: Diagnosis not present

## 2022-07-05 DIAGNOSIS — R928 Other abnormal and inconclusive findings on diagnostic imaging of breast: Secondary | ICD-10-CM

## 2022-07-05 DIAGNOSIS — M9907 Segmental and somatic dysfunction of upper extremity: Secondary | ICD-10-CM | POA: Diagnosis not present

## 2022-07-05 DIAGNOSIS — M9906 Segmental and somatic dysfunction of lower extremity: Secondary | ICD-10-CM | POA: Diagnosis not present

## 2022-07-05 DIAGNOSIS — M9908 Segmental and somatic dysfunction of rib cage: Secondary | ICD-10-CM | POA: Diagnosis not present

## 2022-07-05 DIAGNOSIS — M542 Cervicalgia: Secondary | ICD-10-CM | POA: Diagnosis not present

## 2022-07-05 DIAGNOSIS — M9901 Segmental and somatic dysfunction of cervical region: Secondary | ICD-10-CM | POA: Diagnosis not present

## 2022-07-05 NOTE — Telephone Encounter (Signed)
Patient states she received mammo results and wants another order placed asap. Referred to [facility].

## 2022-07-08 NOTE — Telephone Encounter (Signed)
Patient requests to be called at ph# [phone] asap to discuss mammogram results. Patient states if Patient is unable to answer-permission to leave a detailed message on voice mail.

## 2022-07-09 ENCOUNTER — Other Ambulatory Visit: Payer: Self-pay | Admitting: Family Medicine

## 2022-07-10 ENCOUNTER — Ambulatory Visit
Admission: RE | Admit: 2022-07-10 | Discharge: 2022-07-10 | Disposition: A | Discharge status: Home / Self Care | Payer: PPO | Source: Ambulatory Visit | Attending: Family Medicine | Admitting: Family Medicine

## 2022-07-10 DIAGNOSIS — R928 Other abnormal and inconclusive findings on diagnostic imaging of breast: Secondary | ICD-10-CM | POA: Diagnosis not present

## 2022-07-10 DIAGNOSIS — N6002 Solitary cyst of left breast: Secondary | ICD-10-CM | POA: Diagnosis not present

## 2022-07-11 ENCOUNTER — Other Ambulatory Visit: Payer: PPO

## 2022-07-11 DIAGNOSIS — Z6827 Body mass index (BMI) 27.0-27.9, adult: Secondary | ICD-10-CM | POA: Diagnosis not present

## 2022-07-11 DIAGNOSIS — E785 Hyperlipidemia, unspecified: Secondary | ICD-10-CM | POA: Diagnosis not present

## 2022-07-11 DIAGNOSIS — R7303 Prediabetes: Secondary | ICD-10-CM | POA: Diagnosis not present

## 2022-07-11 DIAGNOSIS — I10 Essential (primary) hypertension: Secondary | ICD-10-CM | POA: Diagnosis not present

## 2022-07-11 DIAGNOSIS — Z8639 Personal history of other endocrine, nutritional and metabolic disease: Secondary | ICD-10-CM | POA: Diagnosis not present

## 2022-07-12 ENCOUNTER — Other Ambulatory Visit: Payer: PPO

## 2022-07-12 ENCOUNTER — Ambulatory Visit: Payer: PPO | Admitting: Family Medicine

## 2022-07-16 ENCOUNTER — Encounter: Payer: Self-pay | Admitting: Family Medicine

## 2022-07-22 DIAGNOSIS — M545 Low back pain, unspecified: Secondary | ICD-10-CM | POA: Diagnosis not present

## 2022-07-22 DIAGNOSIS — M654 Radial styloid tenosynovitis [de Quervain]: Secondary | ICD-10-CM | POA: Diagnosis not present

## 2022-07-22 DIAGNOSIS — M25531 Pain in right wrist: Secondary | ICD-10-CM | POA: Diagnosis not present

## 2022-07-22 DIAGNOSIS — M9903 Segmental and somatic dysfunction of lumbar region: Secondary | ICD-10-CM | POA: Diagnosis not present

## 2022-07-22 DIAGNOSIS — M9905 Segmental and somatic dysfunction of pelvic region: Secondary | ICD-10-CM | POA: Diagnosis not present

## 2022-07-22 DIAGNOSIS — M9901 Segmental and somatic dysfunction of cervical region: Secondary | ICD-10-CM | POA: Diagnosis not present

## 2022-07-22 DIAGNOSIS — M542 Cervicalgia: Secondary | ICD-10-CM | POA: Diagnosis not present

## 2022-07-22 DIAGNOSIS — M9902 Segmental and somatic dysfunction of thoracic region: Secondary | ICD-10-CM | POA: Diagnosis not present

## 2022-07-22 DIAGNOSIS — M546 Pain in thoracic spine: Secondary | ICD-10-CM | POA: Diagnosis not present

## 2022-07-22 DIAGNOSIS — M25541 Pain in joints of right hand: Secondary | ICD-10-CM | POA: Diagnosis not present

## 2022-07-22 DIAGNOSIS — M9904 Segmental and somatic dysfunction of sacral region: Secondary | ICD-10-CM | POA: Diagnosis not present

## 2022-07-24 ENCOUNTER — Encounter: Payer: PPO | Admitting: Family Medicine

## 2022-07-25 ENCOUNTER — Encounter: Payer: Self-pay | Admitting: Family Medicine

## 2022-07-25 ENCOUNTER — Ambulatory Visit (INDEPENDENT_AMBULATORY_CARE_PROVIDER_SITE_OTHER): Payer: PPO | Admitting: Family Medicine

## 2022-07-25 VITALS — BP 102/58 | HR 72 | Temp 98.0°F | Ht 64.0 in | Wt 174.6 lb

## 2022-07-25 DIAGNOSIS — I952 Hypotension due to drugs: Secondary | ICD-10-CM

## 2022-07-25 DIAGNOSIS — H8103 Meniere's disease, bilateral: Secondary | ICD-10-CM | POA: Diagnosis not present

## 2022-07-25 DIAGNOSIS — Z0000 Encounter for general adult medical examination without abnormal findings: Secondary | ICD-10-CM

## 2022-07-25 MED ORDER — HYDROCHLOROTHIAZIDE 12.5 MG PO CAPS: 12.5000 mg | ORAL_CAPSULE | Freq: Every day | ORAL | 3 refills | Status: DC

## 2022-07-25 NOTE — Progress Notes (Signed)
Subjective  Chief Complaint  Patient presents with   Annual Exam    Pt here for Annual Exa,m and is not currently fasting     HPI: Mary Hernandez is a 71 y.o. female who presents to [facility] [place] today for a Female Wellness Visit. She also has the concerns and/or needs as listed above in the chief complaint. These will be addressed in addition to the Health Maintenance Visit.   Wellness Visit: annual visit with health maintenance review and exam without Pap  HM: screens are current. Eligible for pneumovax. Had abnormal screening mammogram recently with benign appearing f/u ultrasound and imaging. She is relieved. Working with healthy weight and wellness. Chronic disease f/u and/or acute problem visit: (deemed necessary to be done in addition to the wellness visit): Menieres: had been on diamox but now on maxzide, however bp is running very low. She is asymptomatic.  Reviewed chronic medical problems: controlled. Reviewed full lab panel from March and bmp / a1c from September.   Assessment  1. Annual physical exam   2. Meniere's disease of both ears   3. Hypotension due to drugs      Plan  Female Wellness Visit: Age appropriate Health Maintenance and Prevention measures were discussed with patient. Included topics are cancer screening recommendations, ways to keep healthy (see AVS) including dietary and exercise recommendations, regular eye and dental care, use of seat belts, and avoidance of moderate alcohol use and tobacco use.  BMI: discussed patient's BMI and encouraged positive lifestyle modifications to help get to or maintain a target BMI. HM needs and immunizations were addressed and ordered. See below for orders. See HM and immunization section for updates. Routine labs and screening tests ordered including cmp, cbc and lipids where appropriate. Discussed recommendations regarding Vit D and calcium supplementation (see AVS)  Chronic disease  management visit and/or acute problem visit: Menieres on chronic diuretics: due to low blood pressures, change to hctz 12.5 daily. Can switch back to diamox if this is not effective treatment for her. Will need to f/u on potassium, currently on kdur 20 daily and taking 1/2 dyazide daily.   Follow up: 6 week to recheck bp, potassium and menieres.   No orders of the defined types were placed in this encounter.  Meds ordered this encounter  Medications   hydrochlorothiazide (MICROZIDE) 12.5 MG capsule    Sig: Take 1 capsule (12.5 mg total) by mouth daily.    Dispense:  90 capsule    Refill:  3      Body mass index is 29.97 kg/m. Wt Readings from Last 3 Encounters:  07/25/22 174 lb 9.6 oz (79.2 kg)  02/20/22 173 lb 4.5 oz (78.6 kg)  01/29/22 166 lb (75.3 kg)     Patient Active Problem List   Diagnosis Date Noted   Obesity, Class I, BMI 30-34.9 10/07/2019    Priority: High   Insomnia due to medical condition 10/18/2018    Priority: High   Prediabetes 10/12/2018    Priority: High   Mixed hyperlipidemia 03/01/2018    Priority: High   Acquired hypothyroidism 03/01/2018    Priority: High   Frequent PVCs 03/01/2018    Priority: High   Adjustment disorder with anxiety 10/07/2019    Priority: Medium     2020 responsive to sertraline    GERD (gastroesophageal reflux disease) 03/30/2018    Priority: Medium    Meniere disease 03/01/2018    Priority: Medium    Notalgia paresthetica  07/01/2019    Priority: Low   Diverticulosis 03/30/2018    Priority: Low   Trigger finger, right ring finger 01/29/2022   Trigger finger, right middle finger 01/29/2022   Influenza A (H1N1) 09/03/2021   History of rotator cuff syndrome with tear and adhesive capsulitis 09/11/2018    MRI R shoulder 08/22/18: IMPRESSION: 1. Calcific tendinosis and low-grade partial-thickness bursal surface tear of the distal anterior supraspinatus tendon at the footprint. 2. Moderate acromioclavicular  osteoarthritis.  10/05/18 - PRP inj    History of lateral epicondylitis of right elbow 06/25/2018    MRI R elbow 08/22/18: IMPRESSION: 1. Partial tear of the common forearm extensor tendon origin.  09/25/18 - PRP inj     Health Maintenance  Topic Date Due   Pneumonia Vaccine 41+ Years old (3 - PPSV23 or PCV20) 06/10/2022   COVID-19 Vaccine (6 - Pfizer risk series) 08/10/2022 (Originally 09/24/2021)   DEXA SCAN  05/29/2023   MAMMOGRAM  07/05/2023   COLONOSCOPY (Pts 45-74yr Insurance coverage will need to be confirmed)  08/11/2023   TETANUS/TDAP  06/19/2027   INFLUENZA VACCINE  Completed   Hepatitis C Screening  Completed   Zoster Vaccines- Shingrix  Completed   HPV VACCINES  Aged Out   Immunization History  Administered Date(s) Administered   Fluad Quad(high Dose 65+) 06/08/2019, 06/21/2020, 07/17/2021, 06/28/2022   Hepatitis A 06/18/2004   Hepatitis B 06/18/2004   Influenza, High Dose Seasonal PF 06/16/2018   Moderna Covid-19 Vaccine Bivalent Booster 120yr& up 02/01/2021   PFIZER(Purple Top)SARS-COV-2 Vaccination 11/14/2019, 12/09/2019, 07/07/2020   Pfizer Covid-19 Vaccine Bivalent Booster 1255yr up 07/30/2021   Pneumococcal Conjugate-13 09/14/2015   Pneumococcal Polysaccharide-23 06/10/2017   Respiratory Syncytial Virus Vaccine,Recomb Aduvanted(Arexvy) 07/05/2022   Tdap 06/18/2017   Zoster Recombinat (Shingrix) 06/18/2017, 11/06/2017   Zoster, Live 08/22/2010   We updated and reviewed the patient's past history in detail and it is documented below. Allergies: Patient is allergic to seasonal ic [cholestatin] and adhesive [tape]. Past Medical History Patient  has a past medical history of Acquired hypothyroidism (03/01/2018), Anxiety, Atrial premature depolarization, BPPV (benign paroxysmal positional vertigo) (03/01/2018), Colon polyps, Complication of anesthesia, Essential hypertension (03/01/2018), Frequent PVCs (03/01/2018), Gallbladder problem, GERD  (gastroesophageal reflux disease), Hip pain, History of cardiac catheterization, History of depression, History of dizziness, History of echocardiogram, History of stomach ulcers, HLD (hyperlipidemia), Hypothyroidism, IBS (irritable bowel syndrome), Infertility, female, Insulin resistance, Lower back pain, Meniere's disease, Notalgia paresthetica, PONV (postoperative nausea and vomiting), Prediabetes, PVC (premature ventricular contraction), Vertigo, and Vitamin D deficiency. Past Surgical History Patient  has a past surgical history that includes S/P Eye (Right, 1967); Tonsillectomy (1975); Foot neuroma surgery (1986); Total abdominal hysterectomy (1996); Breast reduction surgery (1999); Cholecystectomy (2011); Thyroidectomy (2012); Breast biopsy (2013); Carpel Tunnel Release; Refractive surgery; Cardiac catheterization (05/2015); Reduction mammaplasty; Bladder surgery (1997); Colonoscopy (08/20/2015); Esophagogastroduodenoscopy (01/13/2017); and Trigger finger release (Right, 02/20/2022). Family History: Patient family history includes Breast cancer in her mother; Cancer in her mother; Depression in her brother; Diabetes in her daughter; Early death in her sister; Hearing loss in her brother; Heart attack in her father; Heart disease in her father and paternal grandmother; Hyperlipidemia in her father and mother; Peripheral Artery Disease in her father; Pulmonary fibrosis in her father; Thyroid disease in her mother. Social History:  Patient  reports that she has never smoked. She has never used smokeless tobacco. She reports current alcohol use. She reports that she does not use drugs.  Review of Systems: Constitutional: negative for fever  or malaise Ophthalmic: negative for photophobia, double vision or loss of vision Cardiovascular: negative for chest pain, dyspnea on exertion, or new LE swelling Respiratory: negative for SOB or persistent cough Gastrointestinal: negative for abdominal pain, change  in bowel habits or melena Genitourinary: negative for dysuria or gross hematuria, no abnormal uterine bleeding or disharge Musculoskeletal: negative for new gait disturbance or muscular weakness Integumentary: negative for new or persistent rashes, no breast lumps Neurological: negative for TIA or stroke symptoms Psychiatric: negative for SI or delusions Allergic/Immunologic: negative for hives  Patient Care Team    Relationship Specialty Notifications Start End  Leamon Arnt, MD PCP - General Family Medicine  09/06/19   Deboraha Sprang, MD PCP - Electrophysiology Cardiology  03/07/20   Gerda Diss, DO Consulting Physician Sports Medicine  05/25/18   Debbra Riding, MD Consulting Physician Ophthalmology  06/09/18   Deboraha Sprang, MD Consulting Physician Cardiology  10/07/19   Magnus Sinning, MD Consulting Physician Physical Medicine and Rehabilitation  10/07/19   Edythe Clarity, [facility]  Pharmacist  06/05/21     Objective  Vitals: BP (!) 102/58   Pulse 72   Temp 98 F (36.7 C)   Ht '5\' 4"'$  (1.626 m)   Wt 174 lb 9.6 oz (79.2 kg)   SpO2 92%   BMI 29.97 kg/m  General:  Well developed, well nourished, no acute distress  Psych:  Alert and orientedx3,normal mood and affect HEENT:  Normocephalic, atraumatic, non-icteric sclera,  supple neck without adenopathy, mass or thyromegaly Cardiovascular:  Normal S1, S2, RRR without gallop, rub or murmur Respiratory:  Good breath sounds bilaterally, CTAB with normal respiratory effort Gastrointestinal: normal bowel sounds, soft, non-tender, no noted masses. No HSM MSK: no deformities, contusions. Joints are without erythema or swelling.  Skin:  Warm, no rashes or suspicious lesions noted Neurologic:    Mental status is normal. Gross motor and sensory exams are normal. Normal gait. No tremor   Commons side effects, risks, benefits, and alternatives for medications and treatment plan prescribed today were discussed, and the patient  expressed understanding of the given instructions. Patient is instructed to call or message via MyChart if he/she has any questions or concerns regarding our treatment plan. No barriers to understanding were identified. We discussed Red Flag symptoms and signs in detail. Patient expressed understanding regarding what to do in case of urgent or emergency type symptoms.  Medication list was reconciled, printed and provided to the patient in AVS. Patient instructions and summary information was reviewed with the patient as documented in the AVS. This note was prepared with assistance of Dragon voice recognition software. Occasional wrong-word or sound-a-like substitutions may have occurred due to the inherent limitations of voice recognition software

## 2022-07-25 NOTE — Patient Instructions (Signed)
Please return in early December to recheck blood pressure and potassium.  Change to hctz 12.5 daily for menieres when you return from [place] as we discussed.   You need your Pneumovax 23 updated at your convenience.   If you have any questions or concerns, please don't hesitate to send me a message via MyChart or call the office at [phone]. Thank you for visiting with [place] today! It's our pleasure caring for you.

## 2022-08-11 ENCOUNTER — Other Ambulatory Visit: Payer: Self-pay | Admitting: Internal Medicine

## 2022-08-12 DIAGNOSIS — Z8639 Personal history of other endocrine, nutritional and metabolic disease: Secondary | ICD-10-CM | POA: Diagnosis not present

## 2022-08-12 DIAGNOSIS — R7301 Impaired fasting glucose: Secondary | ICD-10-CM | POA: Diagnosis not present

## 2022-08-12 DIAGNOSIS — I9589 Other hypotension: Secondary | ICD-10-CM | POA: Diagnosis not present

## 2022-08-12 DIAGNOSIS — J302 Other seasonal allergic rhinitis: Secondary | ICD-10-CM | POA: Diagnosis not present

## 2022-08-12 DIAGNOSIS — E663 Overweight: Secondary | ICD-10-CM | POA: Diagnosis not present

## 2022-08-12 DIAGNOSIS — R7303 Prediabetes: Secondary | ICD-10-CM | POA: Diagnosis not present

## 2022-08-12 DIAGNOSIS — Z6828 Body mass index (BMI) 28.0-28.9, adult: Secondary | ICD-10-CM | POA: Diagnosis not present

## 2022-08-12 DIAGNOSIS — F419 Anxiety disorder, unspecified: Secondary | ICD-10-CM | POA: Diagnosis not present

## 2022-08-15 DIAGNOSIS — M9906 Segmental and somatic dysfunction of lower extremity: Secondary | ICD-10-CM | POA: Diagnosis not present

## 2022-08-15 DIAGNOSIS — M25551 Pain in right hip: Secondary | ICD-10-CM | POA: Diagnosis not present

## 2022-08-15 DIAGNOSIS — M9905 Segmental and somatic dysfunction of pelvic region: Secondary | ICD-10-CM | POA: Diagnosis not present

## 2022-08-15 DIAGNOSIS — M9904 Segmental and somatic dysfunction of sacral region: Secondary | ICD-10-CM | POA: Diagnosis not present

## 2022-08-15 DIAGNOSIS — M542 Cervicalgia: Secondary | ICD-10-CM | POA: Diagnosis not present

## 2022-08-15 DIAGNOSIS — M79672 Pain in left foot: Secondary | ICD-10-CM | POA: Diagnosis not present

## 2022-08-15 DIAGNOSIS — M9901 Segmental and somatic dysfunction of cervical region: Secondary | ICD-10-CM | POA: Diagnosis not present

## 2022-08-16 ENCOUNTER — Encounter: Payer: PPO | Admitting: Family Medicine

## 2022-09-03 ENCOUNTER — Telehealth: Payer: Self-pay | Admitting: Pharmacist

## 2022-09-03 NOTE — Progress Notes (Unsigned)
Chronic Care Management Pharmacy Assistant   Name: Mary Hernandez  MRN: [id number] DOB: 1951-01-03   Reason for Encounter: General Adherence Call    Recent office visits:  07/25/2022 [facility] (PCP) Leamon Arnt, MD; due to low blood pressures, change to hctz 12.5 daily. Can switch back to diamox if this is not effective treatment for her. Will need to f/u on potassium, currently on kdur 20 daily and taking 1/2 dyazide daily.  06/14/2022 [facility] (Fam Med) Mar Daring, PA-C;  Paxlovid as directed for COVID-19.  Recent consult visits:  None  Hospital visits:  None in previous 6 months  Medications: Outpatient Encounter Medications as of 09/03/2022  Medication Sig   acebutolol (SECTRAL) 200 MG capsule TAKE ONE CAPSULE BY MOUTH DAILY   Calcium Citrate-Vitamin D 315-250 MG-UNIT TABS Take by mouth.   diclofenac Sodium (VOLTAREN) 1 % GEL Apply 2 g topically 4 (four) times daily.   EVENING PRIMROSE OIL PO Take 1,500 mg by mouth 2 (two) times daily.   flecainide (TAMBOCOR) 50 MG tablet TAKE 1 AND 1/2 TABLET BY MOUTH TWO TIMES A DAY   fluticasone (FLONASE) 50 MCG/ACT nasal spray Place 1 spray into both nostrils daily.   Glucosamine-Chondroit-Vit C-Mn (GLUCOSAMINE 1500 COMPLEX) CAPS Take by mouth.   glycopyrrolate (ROBINUL) 1 MG tablet TAKE ONE TABLET BY MOUTH TWICE A DAY   hydrochlorothiazide (MICROZIDE) 12.5 MG capsule Take 1 capsule (12.5 mg total) by mouth daily.   levothyroxine (SYNTHROID) 100 MCG tablet TAKE ONE TABLET BY MOUTH DAILY   MAGNESIUM PO Take by mouth.   montelukast (SINGULAIR) 10 MG tablet Take 1 tablet (10 mg total) by mouth at bedtime.   naltrexone (DEPADE) 50 MG tablet Take 50 mg by mouth in the morning and at bedtime.   omega-3 acid ethyl esters (LOVAZA) 1 g capsule Take 1 g by mouth 2 (two) times daily.   ondansetron (ZOFRAN-ODT) 4 MG disintegrating tablet Take 4 mg by mouth every 8 (eight) hours as needed for nausea or vomiting.   Potassium Chloride ER 20 MEQ  TBCR Take 1 tablet by mouth daily.   Probiotic Product (PROBIOTIC-10 PO) Take 1 capsule by mouth daily.   rosuvastatin (CRESTOR) 10 MG tablet TAKE ONE TABLET BY MOUTH DAILY   Semaglutide, 2 MG/DOSE, (OZEMPIC, 2 MG/DOSE,) 8 MG/3ML SOPN Inject 2 mg into the skin once a week.   sertraline (ZOLOFT) 50 MG tablet Take 2 tablets (100 mg total) by mouth daily.   TART CHERRY PO Take 1 tablet by mouth daily.   Turmeric 400 MG CAPS Take 3 capsules by mouth daily.   UNABLE TO FIND Inject 2 mg into the skin once a week. Med Name: Ozempic 2 mg dose (8 mg/3 mL subcutaneous pen injector)   No facility-administered encounter medications on file as of 09/03/2022.   [facility] for General Review Call   Chart Review:  Have there been any documented new, changed, or discontinued medications since last visit? {yes/no:20286} (If yes, include name, dose, frequency, date) Has there been any documented recent hospitalizations or ED visits since last visit with Clinical Pharmacist? {yes/no:20286} Brief Summary (including medication and/or Diagnosis changes):   Adherence Review:  Does the Clinical Pharmacist Assistant have access to adherence rates? {yes/no:20286} Adherence rates for STAR metric medications (List medication(s)/day supply/ last 2 fill dates). Adherence rates for medications indicated for disease state being reviewed (List medication(s)/day supply/ last 2 fill dates). Does the patient have >5 day gap between last estimated fill  dates for any of the above medications or other medication gaps? {yes/no:20286} Reason for medication gaps. Do you receive your medications through PAP? {yes/no:20286}  If Yes, what is the status? Last received?   Disease State Questions:  Able to connect with Patient? {yes/no:20286} Did patient have any problems with their health recently? {yes/no:20286} Note problems and Concerns: Have you had any admissions or emergency room visits or worsening of  your condition(s) since last visit? {yes/no:20286} Details of ED visit, hospital visit and/or worsening condition(s): Have you had any visits with new specialists or providers since your last visit? {yes/no:20286} Explain: Have you had any new health care problem(s) since your last visit? {yes/no:20286} New problem(s) reported: Have you run out of any of your medications since you last spoke with clinical pharmacist? {yes/no:20286} What caused you to run out of your medications? Are there any medications you are not taking as prescribed? {yes/no:20286} What kept you from taking your medications as prescribed? Are you having any issues or side effects with your medications? {yes/no:20286} Note of issues or side effects: Do you have any other health concerns or questions you want to discuss with your Clinical Pharmacist before your next visit? {yes/no:20286} Note additional concerns and questions from Patient. Are there any health concerns that you feel we can do a better job addressing? {yes/no:20286} Note Patient's response. Are you having any problems with any of the following since the last visit: (select all that apply)  {General Call:27390}  Details: 12. Any falls since last visit? {yes/no:20286}  Details: 13. Any increased or uncontrolled pain since last visit? {yes/no:20286}  Details: 14. Next visit Type: {Telephone/Office:25179}       Visit with:        Date:        Time:  16. Additional Details? {yes/no:20286}    Care Gaps: Medicare Annual Wellness: Completed 11/16/2021 Hemoglobin A1C: 5.8% on 10/23/2021 Colonoscopy: Next due on 08/11/2023 Dexa Scan: Next due on 05/29/2023 Mammogram: Completed on 07/04/2022  Future Appointments  Date Time Provider [place]  09/05/2022 11:00 AM Leamon Arnt, MD [facility]  07/28/2023  1:00 PM Leamon Arnt, MD [facility]   Star Rating Drugs: Rosuvastatin last filled 04/03/2022 90 DS Ozempic last filled 01/01/2022 84  DS  April D Calhoun, [place] Pharmacist Assistant [phone]

## 2022-09-05 ENCOUNTER — Encounter: Payer: Self-pay | Admitting: Family Medicine

## 2022-09-05 ENCOUNTER — Ambulatory Visit (INDEPENDENT_AMBULATORY_CARE_PROVIDER_SITE_OTHER): Payer: PPO | Admitting: Family Medicine

## 2022-09-05 VITALS — BP 104/52 | HR 75 | Temp 97.6°F | Ht 64.0 in | Wt 173.2 lb

## 2022-09-05 DIAGNOSIS — E039 Hypothyroidism, unspecified: Secondary | ICD-10-CM | POA: Diagnosis not present

## 2022-09-05 DIAGNOSIS — I493 Ventricular premature depolarization: Secondary | ICD-10-CM

## 2022-09-05 DIAGNOSIS — E782 Mixed hyperlipidemia: Secondary | ICD-10-CM

## 2022-09-05 DIAGNOSIS — H8103 Meniere's disease, bilateral: Secondary | ICD-10-CM | POA: Diagnosis not present

## 2022-09-05 DIAGNOSIS — M9906 Segmental and somatic dysfunction of lower extremity: Secondary | ICD-10-CM | POA: Diagnosis not present

## 2022-09-05 DIAGNOSIS — M9908 Segmental and somatic dysfunction of rib cage: Secondary | ICD-10-CM | POA: Diagnosis not present

## 2022-09-05 DIAGNOSIS — M9901 Segmental and somatic dysfunction of cervical region: Secondary | ICD-10-CM | POA: Diagnosis not present

## 2022-09-05 DIAGNOSIS — M542 Cervicalgia: Secondary | ICD-10-CM | POA: Diagnosis not present

## 2022-09-05 DIAGNOSIS — M79672 Pain in left foot: Secondary | ICD-10-CM | POA: Diagnosis not present

## 2022-09-05 DIAGNOSIS — M9904 Segmental and somatic dysfunction of sacral region: Secondary | ICD-10-CM | POA: Diagnosis not present

## 2022-09-05 DIAGNOSIS — M9905 Segmental and somatic dysfunction of pelvic region: Secondary | ICD-10-CM | POA: Diagnosis not present

## 2022-09-05 LAB — COMPREHENSIVE METABOLIC PANEL
ALT: 16 U/L (ref 0–35)
AST: 16 U/L (ref 0–37)
Albumin: 4.4 g/dL (ref 3.5–5.2)
Alkaline Phosphatase: 51 U/L (ref 39–117)
BUN: 16 mg/dL (ref 6–23)
CO2: 27 mEq/L (ref 19–32)
Calcium: 8.6 mg/dL (ref 8.4–10.5)
Chloride: 104 mEq/L (ref 96–112)
Creatinine, Ser: 0.64 mg/dL (ref 0.40–1.20)
GFR: 89.67 mL/min (ref >60.00)
Glucose, Bld: 103 mg/dL — ABNORMAL HIGH (ref 70–99)
Potassium: 3.9 mEq/L (ref 3.5–5.1)
Sodium: 140 mEq/L (ref 135–145)
Total Bilirubin: 0.4 mg/dL (ref 0.2–1.2)
Total Protein: 7 g/dL (ref 6.0–8.3)

## 2022-09-05 LAB — LIPID PANEL
Cholesterol: 144 mg/dL (ref 0–200)
HDL: 60.8 mg/dL (ref >39.00)
LDL Cholesterol: 53 mg/dL (ref 0–99)
NonHDL: 83.36
Total CHOL/HDL Ratio: 2
Triglycerides: 151 mg/dL — ABNORMAL HIGH (ref 0.0–149.0)
VLDL: 30.2 mg/dL (ref 0.0–40.0)

## 2022-09-05 LAB — TSH: TSH: 0.56 u[IU]/mL (ref 0.35–5.50)

## 2022-09-05 NOTE — Patient Instructions (Signed)
Please return in October 2024 for your annual complete physical; please come fasting.   I will release your lab results to you on your MyChart account with further instructions. You may see the results before I do, but when I review them I will send you a message with my report or have my assistant call you if things need to be discussed. Please reply to my message with any questions. Thank you!   If you have any questions or concerns, please don't hesitate to send me a message via MyChart or call the office at [phone]. Thank you for visiting with [place] today! It's our pleasure caring for you.

## 2022-09-05 NOTE — Progress Notes (Addendum)
Subjective  CC:  Chief Complaint  Patient presents with   Hypotension    HPI: Mary Hernandez is a 71 y.o. female who presents to the office today to address the problems listed above in the chief complaint. Short term f/u from CPE : see last note. Bp was running low on maxzide 37.5/25 1/2 tab daily for menieres disease and kdur 52mq daily. Renal function and potassium normal on that dose: we changed to hctz 12.5 and pt remains well w/o menieres flare. Home bp's mildly better 105-110/50s. Still feels fine w/o lightheadedness or sxs of low bp. Needs potassium rechecked on fleconaide Hypothyroidism: clinically well and compliant with meds. Last checked 12/2021.  On fleconaide for frequent pvcs and pacs: remains well controlled on meds. Needs f/u with Dr. KCaryl Comessoon. Reviewed last office notes and labs.  Menieres; stable.  H/o borderline low calcium levels. No sxs. Takes calcium supplements   Assessment  1. Acquired hypothyroidism   2. Frequent PVCs   3. Mixed hyperlipidemia   4. Meniere's disease of both ears   5. Hypocalcemia      Plan  Low thyroid:  recheck tSh on levtx '100mg'$  daily. Clinically euthyroid Hypotension on diuretic for menieres: improved on low dose hctz: recheck renal and lytes today. On kdur 273m. May need to adjust that dose. Education given.  Check ionized calcium and pts HLD on statin: recheck lipids today. Non fasting.   Follow up: oct 2024 for cpe  Visit date not found  Orders Placed This Encounter  Procedures   TSH   Comprehensive metabolic panel   PTH, Intact (ICMA) and Ionized Calcium   Lipid panel   No orders of the defined types were placed in this encounter.     I reviewed the patients updated PMH, FH, and SocHx.    Patient Active Problem List   Diagnosis Date Noted   Obesity, Class I, BMI 30-34.9 10/07/2019    Priority: High   Insomnia due to medical condition 10/18/2018    Priority: High   Prediabetes 10/12/2018    Priority: High    Mixed hyperlipidemia 03/01/2018    Priority: High   Acquired hypothyroidism 03/01/2018    Priority: High   Frequent PVCs 03/01/2018    Priority: High   Adjustment disorder with anxiety 10/07/2019    Priority: Medium    GERD (gastroesophageal reflux disease) 03/30/2018    Priority: Medium    Meniere disease 03/01/2018    Priority: Medium    Notalgia paresthetica 07/01/2019    Priority: Low   Diverticulosis 03/30/2018    Priority: Low   Trigger finger, right ring finger 01/29/2022   Trigger finger, right middle finger 01/29/2022   Influenza A (H1N1) 09/03/2021   History of rotator cuff syndrome with tear and adhesive capsulitis 09/11/2018   History of lateral epicondylitis of right elbow 06/25/2018   Current Meds  Medication Sig   acebutolol (SECTRAL) 200 MG capsule TAKE ONE CAPSULE BY MOUTH DAILY   Calcium Citrate-Vitamin D 315-250 MG-UNIT TABS Take by mouth.   diclofenac Sodium (VOLTAREN) 1 % GEL Apply 2 g topically 4 (four) times daily.   EVENING PRIMROSE OIL PO Take 1,500 mg by mouth 2 (two) times daily.   flecainide (TAMBOCOR) 50 MG tablet TAKE 1 AND 1/2 TABLET BY MOUTH TWO TIMES A DAY   fluticasone (FLONASE) 50 MCG/ACT nasal spray Place 1 spray into both nostrils daily.   Glucosamine-Chondroit-Vit C-Mn (GLUCOSAMINE 1500 COMPLEX) CAPS Take by mouth.   glycopyrrolate (ROBINUL) 1  MG tablet TAKE ONE TABLET BY MOUTH TWICE A DAY   hydrochlorothiazide (MICROZIDE) 12.5 MG capsule Take 1 capsule (12.5 mg total) by mouth daily.   levothyroxine (SYNTHROID) 100 MCG tablet TAKE ONE TABLET BY MOUTH DAILY   MAGNESIUM PO Take by mouth.   montelukast (SINGULAIR) 10 MG tablet Take 1 tablet (10 mg total) by mouth at bedtime.   naltrexone (DEPADE) 50 MG tablet Take 50 mg by mouth in the morning and at bedtime.   omega-3 acid ethyl esters (LOVAZA) 1 g capsule Take 1 g by mouth 2 (two) times daily.   ondansetron (ZOFRAN-ODT) 4 MG disintegrating tablet Take 4 mg by mouth every 8 (eight) hours as  needed for nausea or vomiting.   Potassium Chloride ER 20 MEQ TBCR Take 1 tablet by mouth daily.   Probiotic Product (PROBIOTIC-10 PO) Take 1 capsule by mouth daily.   rosuvastatin (CRESTOR) 10 MG tablet TAKE ONE TABLET BY MOUTH DAILY   Semaglutide, 2 MG/DOSE, (OZEMPIC, 2 MG/DOSE,) 8 MG/3ML SOPN Inject 2 mg into the skin once a week.   sertraline (ZOLOFT) 50 MG tablet Take 2 tablets (100 mg total) by mouth daily.   TART CHERRY PO Take 1 tablet by mouth daily.   Turmeric 400 MG CAPS Take 3 capsules by mouth daily.   UNABLE TO FIND Inject 2 mg into the skin once a week. Med Name: Ozempic 2 mg dose (8 mg/3 mL subcutaneous pen injector)    Allergies: Patient is allergic to seasonal ic [cholestatin] and adhesive [tape]. Family History: Patient family history includes Breast cancer in her mother; Cancer in her mother; Depression in her brother; Diabetes in her daughter; Early death in her sister; Hearing loss in her brother; Heart attack in her father; Heart disease in her father and paternal grandmother; Hyperlipidemia in her father and mother; Peripheral Artery Disease in her father; Pulmonary fibrosis in her father; Thyroid disease in her mother. Social History:  Patient  reports that she has never smoked. She has never used smokeless tobacco. She reports current alcohol use. She reports that she does not use drugs.  Review of Systems: Constitutional: Negative for fever malaise or anorexia Cardiovascular: negative for chest pain Respiratory: negative for SOB or persistent cough Gastrointestinal: negative for abdominal pain  Objective  Vitals: BP (!) 104/52 Comment: seated right arm, cla, MD  Pulse 75   Temp 97.6 F (36.4 C)   Ht '5\' 4"'$  (1.626 m)   Wt 173 lb 3.2 oz (78.6 kg)   SpO2 95%   BMI 29.73 kg/m  General: no acute distress , A&Ox3 HEENT: PEERL, conjunctiva normal, neck is supple Cardiovascular:  RRR without murmur or gallop.  Respiratory:  Good breath sounds bilaterally, CTAB  with normal respiratory effort Skin:  Warm, no rashes    Commons side effects, risks, benefits, and alternatives for medications and treatment plan prescribed today were discussed, and the patient expressed understanding of the given instructions. Patient is instructed to call or message via MyChart if he/she has any questions or concerns regarding our treatment plan. No barriers to understanding were identified. We discussed Red Flag symptoms and signs in detail. Patient expressed understanding regarding what to do in case of urgent or emergency type symptoms.  Medication list was reconciled, printed and provided to the patient in AVS. Patient instructions and summary information was reviewed with the patient as documented in the AVS. This note was prepared with assistance of Dragon voice recognition software. Occasional wrong-word or sound-a-like substitutions may have occurred  due to the inherent limitations of voice recognition software  This visit occurred during the SARS-CoV-2 public health emergency.  Safety protocols were in place, including screening questions prior to the visit, additional usage of staff PPE, and extensive cleaning of exam room while observing appropriate contact time as indicated for disinfecting solutions.

## 2022-09-06 LAB — PTH, INTACT (ICMA) AND IONIZED CALCIUM
Calcium, Ion: 4.8 mg/dL (ref 4.7–5.5)
Calcium: 8.8 mg/dL (ref 8.6–10.4)
PTH: 19 pg/mL (ref 16–77)

## 2022-09-10 DIAGNOSIS — E8881 Metabolic syndrome: Secondary | ICD-10-CM | POA: Diagnosis not present

## 2022-09-10 DIAGNOSIS — Z8639 Personal history of other endocrine, nutritional and metabolic disease: Secondary | ICD-10-CM | POA: Diagnosis not present

## 2022-09-10 DIAGNOSIS — M7060 Trochanteric bursitis, unspecified hip: Secondary | ICD-10-CM | POA: Diagnosis not present

## 2022-09-10 DIAGNOSIS — E663 Overweight: Secondary | ICD-10-CM | POA: Diagnosis not present

## 2022-09-10 DIAGNOSIS — Z6827 Body mass index (BMI) 27.0-27.9, adult: Secondary | ICD-10-CM | POA: Diagnosis not present

## 2022-09-10 DIAGNOSIS — E782 Mixed hyperlipidemia: Secondary | ICD-10-CM | POA: Diagnosis not present

## 2022-09-15 ENCOUNTER — Encounter: Payer: Self-pay | Admitting: Family Medicine

## 2022-09-16 ENCOUNTER — Other Ambulatory Visit: Payer: Self-pay

## 2022-09-16 MED ORDER — ROSUVASTATIN CALCIUM 10 MG PO TABS: 10.0000 mg | ORAL_TABLET | Freq: Every day | ORAL | 0 refills | Status: DC

## 2022-09-17 DIAGNOSIS — M542 Cervicalgia: Secondary | ICD-10-CM | POA: Diagnosis not present

## 2022-09-17 DIAGNOSIS — M9905 Segmental and somatic dysfunction of pelvic region: Secondary | ICD-10-CM | POA: Diagnosis not present

## 2022-09-17 DIAGNOSIS — M9906 Segmental and somatic dysfunction of lower extremity: Secondary | ICD-10-CM | POA: Diagnosis not present

## 2022-09-17 DIAGNOSIS — M9904 Segmental and somatic dysfunction of sacral region: Secondary | ICD-10-CM | POA: Diagnosis not present

## 2022-09-17 DIAGNOSIS — M546 Pain in thoracic spine: Secondary | ICD-10-CM | POA: Diagnosis not present

## 2022-09-17 DIAGNOSIS — M9902 Segmental and somatic dysfunction of thoracic region: Secondary | ICD-10-CM | POA: Diagnosis not present

## 2022-09-17 DIAGNOSIS — M9901 Segmental and somatic dysfunction of cervical region: Secondary | ICD-10-CM | POA: Diagnosis not present

## 2022-09-17 DIAGNOSIS — M25551 Pain in right hip: Secondary | ICD-10-CM | POA: Diagnosis not present

## 2022-09-18 ENCOUNTER — Encounter: Payer: Self-pay | Admitting: Internal Medicine

## 2022-09-18 ENCOUNTER — Ambulatory Visit: Payer: PPO | Attending: Internal Medicine | Admitting: Internal Medicine

## 2022-09-18 VITALS — BP 92/52 | HR 70 | Ht 64.0 in | Wt 178.0 lb

## 2022-09-18 DIAGNOSIS — R9431 Abnormal electrocardiogram [ECG] [EKG]: Secondary | ICD-10-CM | POA: Diagnosis not present

## 2022-09-18 DIAGNOSIS — I493 Ventricular premature depolarization: Secondary | ICD-10-CM | POA: Diagnosis not present

## 2022-09-18 NOTE — Patient Instructions (Signed)
Medication Instructions:  Your physician has recommended you make the following change in your medication:   ** Stop Acebutolol  *If you need a refill on your cardiac medications before your next appointment, please call your pharmacy*   Lab Work: None ordered.  If you have labs (blood work) drawn today and your tests are completely normal, you will receive your results only by: [place] (if you have MyChart) OR A paper copy in the mail If you have any lab test that is abnormal or we need to change your treatment, we will call you to review the results.   Testing/Procedures: None ordered.    Follow-Up: At [facility], you and your health needs are our priority.  As part of our continuing mission to provide you with exceptional heart care, we have created designated Provider Care Teams.  These Care Teams include your primary Cardiologist (physician) and Advanced Practice Providers (APPs -  Physician Assistants and Nurse Practitioners) who all work together to provide you with the care you need, when you need it.  We recommend signing up for the patient portal called "MyChart".  Sign up information is provided on this After Visit Summary.  MyChart is used to connect with patients for Virtual Visits (Telemedicine).  Patients are able to view lab/test results, encounter notes, upcoming appointments, etc.  Non-urgent messages can be sent to your provider as well.   To learn more about what you can do with MyChart, go to [email or web address].    Your next appointment:   6 months with Dr Caryl Comes  Important Information About Sugar

## 2022-09-18 NOTE — Progress Notes (Signed)
ELECTROPHYSIOLOGY Office NOTE  Patient ID: Mary Hernandez, MRN: [id number], DOB/AGE: 1951-08-19 71 y.o. Admit date: (Not on file) Date of Consult: 09/18/2022  Primary Physician: Leamon Arnt, MD Primary Cardiologist: new  ( formerly [facility]        HPI Mary Hernandez is a 71 y.o. female Seen to establish care in [place].  Previously seen at [facility] cardiology for symptomatic PACs and PVCs treated with flecainide.  Initally once daily then twice daily  The patient denies chest pain, shortness of breath, nocturnal dyspnea, orthopnea or peripheral edema.  There have been no palpitations, lightheadedness or syncope.    Has had low blood pressure.  In the 90s.  Most without symptoms except with abrupt standing.  PCP has been concerned.  Decreased her hydrochlorothiazide from 25--12.5.  Still on potassium.       DATE TEST EF    2/16 LHC  60-65 % Coronary Art normal  6/17 Echo   75 %    10/19  Calcium Score    0  7/20 Echo  60-65%     DATE PR interval QRSduration Dose  4/14 170 90 0  7/18  224 112 100  9/19 218 130 100  11/20 206 132 75  12/21 198 118 75  12/23 212 122 75        Past Medical History:  Diagnosis Date   Acquired hypothyroidism 03/01/2018   hx of goiter; s/p thyroidectomy   Anxiety    Atrial premature depolarization    BPPV (benign paroxysmal positional vertigo) 03/01/2018   Colon polyps    Complication of anesthesia    takes longer to wake up   Essential hypertension 03/01/2018   denies hx - on diuretic for Meniere's and beta blocker for PVCs   Frequent PVCs 03/01/2018   Controlled on Flecainide, Acebutolol   Gallbladder problem    GERD (gastroesophageal reflux disease)    Hip pain    History of cardiac catheterization    Nuc 7/16:  normal perfusion; EF 76 // LHC 8/16:  normal coronary arteries   History of depression    History of dizziness    History of echocardiogram    Echo 03/2006:  Normal LVEF   History  of stomach ulcers    HLD (hyperlipidemia)    Hypothyroidism    IBS (irritable bowel syndrome)    Infertility, female    Insulin resistance    Lower back pain    Meniere's disease    Notalgia paresthetica    PONV (postoperative nausea and vomiting)    Prediabetes    PVC (premature ventricular contraction)    Vertigo    Vitamin D deficiency       Surgical History:  Past Surgical History:  Procedure Laterality Date   BLADDER SURGERY  1997   BREAST BIOPSY  2013   BREAST REDUCTION SURGERY  1999   CARDIAC CATHETERIZATION  05/2015   Carpel Tunnel Release     CHOLECYSTECTOMY  2011   COLONOSCOPY  08/20/2015   ESOPHAGOGASTRODUODENOSCOPY  01/13/2017   FOOT NEUROMA SURGERY  1986   REDUCTION MAMMAPLASTY     REFRACTIVE SURGERY     S/P Eye Right 1967   Muscle Clipped    THYROIDECTOMY  2012   TONSILLECTOMY  1975   TOTAL ABDOMINAL HYSTERECTOMY  1996   TRIGGER FINGER RELEASE Right 02/20/2022   Procedure: RIGHT MIDDLE AND RING FINGER RELEASE TRIGGER FINGER/A-1 PULLEY;  Surgeon: Sherilyn Cooter, MD;  Location:  [facility];  Service: Orthopedics;  Laterality: Right;    Current Outpatient Medications on File Prior to Visit  Medication Sig Dispense Refill   acebutolol (SECTRAL) 200 MG capsule TAKE ONE CAPSULE BY MOUTH DAILY 90 capsule 1   Calcium Citrate-Vitamin D 315-250 MG-UNIT TABS Take by mouth.     diclofenac Sodium (VOLTAREN) 1 % GEL Apply 2 g topically 4 (four) times daily. 350 g 5   EVENING PRIMROSE OIL PO Take 1,500 mg by mouth 2 (two) times daily.     flecainide (TAMBOCOR) 50 MG tablet TAKE 1 AND 1/2 TABLET BY MOUTH TWO TIMES A DAY 270 tablet 0   fluticasone (FLONASE) 50 MCG/ACT nasal spray Place 1 spray into both nostrils daily. 16 g 11   Glucosamine-Chondroit-Vit C-Mn (GLUCOSAMINE 1500 COMPLEX) CAPS Take by mouth.     glycopyrrolate (ROBINUL) 1 MG tablet TAKE ONE TABLET BY MOUTH TWICE A DAY 180 tablet 0   hydrochlorothiazide (MICROZIDE) 12.5 MG capsule Take 1  capsule (12.5 mg total) by mouth daily. 90 capsule 3   levothyroxine (SYNTHROID) 100 MCG tablet TAKE ONE TABLET BY MOUTH DAILY 90 tablet 3   MAGNESIUM PO Take by mouth.     meloxicam (MOBIC) 7.5 MG tablet Take 7.5 mg by mouth daily.     montelukast (SINGULAIR) 10 MG tablet Take 1 tablet (10 mg total) by mouth at bedtime. 90 tablet 3   naltrexone (DEPADE) 50 MG tablet Take 50 mg by mouth in the morning and at bedtime.     omega-3 acid ethyl esters (LOVAZA) 1 g capsule Take 1 g by mouth 2 (two) times daily.     ondansetron (ZOFRAN-ODT) 4 MG disintegrating tablet Take 4 mg by mouth every 8 (eight) hours as needed for nausea or vomiting.     Potassium Chloride ER 20 MEQ TBCR Take 1 tablet by mouth daily. 90 tablet 1   Probiotic Product (PROBIOTIC-10 PO) Take 1 capsule by mouth daily.     rosuvastatin (CRESTOR) 10 MG tablet Take 1 tablet (10 mg total) by mouth daily. 90 tablet 0   Semaglutide, 2 MG/DOSE, (OZEMPIC, 2 MG/DOSE,) 8 MG/3ML SOPN Inject 2 mg into the skin once a week. 9 mL 1   sertraline (ZOLOFT) 50 MG tablet Take 2 tablets (100 mg total) by mouth daily. 180 tablet 3   TART CHERRY PO Take 1 tablet by mouth daily.     Turmeric 400 MG CAPS Take 3 capsules by mouth daily.     No current facility-administered medications on file prior to visit.    Allergies:  Allergies  Allergen Reactions   Seasonal Ic [Cholestatin]    Adhesive [Tape] Rash      ROS:  Please see the history of present illness.     All other systems reviewed and negative.   Physical Examination  BP (!) 92/52   Pulse 70   Ht '5\' 4"'$  (1.626 m)   Wt 178 lb (80.7 kg)   SpO2 95%   BMI 30.55 kg/m  Well developed and well nourished in no acute distress HENT normal Neck supple with JVP-flat Clear Regular rate and rhythm, no  gallop murmur Abd-soft with active BS No Clubbing cyanosis  edema Skin-warm and dry A & Oriented  Grossly normal sensory and motor function  ECG sinus at 67 Interval 21/12/44 Prior septal  MI Unchanged 12/22   Cardiac Enzymes No results for input(s): "CKTOTAL", "CKMB", "TROPONINI" in the last 72 hours. CBC Lab Results  Component Value Date  WBC 6.1 01/01/2022   HGB 13.9 01/01/2022   HCT 42.7 01/01/2022   MCV 91 01/01/2022   PLT 200 01/01/2022   PROTIME: No results for input(s): "LABPROT", "INR" in the last 72 hours. Chemistry No results for input(s): "NA", "K", "CL", "CO2", "BUN", "CREATININE", "CALCIUM", "PROT", "BILITOT", "ALKPHOS", "ALT", "AST", "GLUCOSE" in the last 168 hours.  Invalid input(s): "LABALBU" Lipids Lab Results  Component Value Date   CHOL 144 09/05/2022   HDL 60.80 09/05/2022   LDLCALC 53 09/05/2022   TRIG 151.0 (H) 09/05/2022    EKG: sinus 71 22/13/44   Assessment and Plan:  PVCs  Hypokalemia  Abnormal ECG    PVCs remain well-controlled.  Will continue the flecainide 75 mg twice daily.  Intervals are at the upper limit of acceptability but for now we will continue.  May need down titration again later.  With her low blood pressure, we will discontinue her once a day acebutolol.  She will take it with her on her trip to [place].  She has a history of hypokalemia.  On the lower dose of hydrochlorothiazide she may not need potassium supplementation.  I have suggested that she wait until she return from [place] and then stop it and then follow-up with Dr. Jonni Sanger and get a BMET within about 2 weeks.     Virl Axe

## 2022-10-29 ENCOUNTER — Other Ambulatory Visit: Payer: Self-pay | Admitting: Family Medicine

## 2022-10-29 DIAGNOSIS — H2513 Age-related nuclear cataract, bilateral: Secondary | ICD-10-CM | POA: Diagnosis not present

## 2022-10-29 DIAGNOSIS — H0288B Meibomian gland dysfunction left eye, upper and lower eyelids: Secondary | ICD-10-CM | POA: Diagnosis not present

## 2022-10-29 DIAGNOSIS — H35371 Puckering of macula, right eye: Secondary | ICD-10-CM | POA: Diagnosis not present

## 2022-10-29 DIAGNOSIS — H0288A Meibomian gland dysfunction right eye, upper and lower eyelids: Secondary | ICD-10-CM | POA: Diagnosis not present

## 2022-10-30 DIAGNOSIS — E663 Overweight: Secondary | ICD-10-CM | POA: Diagnosis not present

## 2022-10-30 DIAGNOSIS — E782 Mixed hyperlipidemia: Secondary | ICD-10-CM | POA: Diagnosis not present

## 2022-10-30 DIAGNOSIS — R7301 Impaired fasting glucose: Secondary | ICD-10-CM | POA: Diagnosis not present

## 2022-10-30 DIAGNOSIS — Z6827 Body mass index (BMI) 27.0-27.9, adult: Secondary | ICD-10-CM | POA: Diagnosis not present

## 2022-10-30 DIAGNOSIS — Z8639 Personal history of other endocrine, nutritional and metabolic disease: Secondary | ICD-10-CM | POA: Diagnosis not present

## 2022-11-01 DIAGNOSIS — G44209 Tension-type headache, unspecified, not intractable: Secondary | ICD-10-CM | POA: Diagnosis not present

## 2022-11-01 DIAGNOSIS — G4486 Cervicogenic headache: Secondary | ICD-10-CM | POA: Diagnosis not present

## 2022-11-01 DIAGNOSIS — M9908 Segmental and somatic dysfunction of rib cage: Secondary | ICD-10-CM | POA: Diagnosis not present

## 2022-11-01 DIAGNOSIS — M25511 Pain in right shoulder: Secondary | ICD-10-CM | POA: Diagnosis not present

## 2022-11-01 DIAGNOSIS — M9902 Segmental and somatic dysfunction of thoracic region: Secondary | ICD-10-CM | POA: Diagnosis not present

## 2022-11-01 DIAGNOSIS — M25512 Pain in left shoulder: Secondary | ICD-10-CM | POA: Diagnosis not present

## 2022-11-01 DIAGNOSIS — M9907 Segmental and somatic dysfunction of upper extremity: Secondary | ICD-10-CM | POA: Diagnosis not present

## 2022-11-01 DIAGNOSIS — M542 Cervicalgia: Secondary | ICD-10-CM | POA: Diagnosis not present

## 2022-11-01 DIAGNOSIS — M9901 Segmental and somatic dysfunction of cervical region: Secondary | ICD-10-CM | POA: Diagnosis not present

## 2022-11-01 DIAGNOSIS — M9900 Segmental and somatic dysfunction of head region: Secondary | ICD-10-CM | POA: Diagnosis not present

## 2022-11-04 ENCOUNTER — Encounter: Payer: Self-pay | Admitting: Internal Medicine

## 2022-11-04 ENCOUNTER — Encounter: Payer: Self-pay | Admitting: Family Medicine

## 2022-11-05 ENCOUNTER — Other Ambulatory Visit: Payer: Self-pay

## 2022-11-05 ENCOUNTER — Telehealth: Payer: Self-pay

## 2022-11-05 ENCOUNTER — Encounter: Payer: Self-pay | Admitting: Family Medicine

## 2022-11-05 ENCOUNTER — Telehealth: Payer: Self-pay | Admitting: Family Medicine

## 2022-11-05 DIAGNOSIS — E876 Hypokalemia: Secondary | ICD-10-CM

## 2022-11-05 NOTE — Telephone Encounter (Signed)
PA was done today for Robinul  Key: [id number].  Waiting on approval/denial

## 2022-11-05 NOTE — Telephone Encounter (Signed)
Pt states pharmacy is waiting on Prior Auth for  glycopyrrolate (ROBINUL) 1 MG tablet  Since last week, is there any info on process?

## 2022-11-05 NOTE — Telephone Encounter (Signed)
Waiting on approval/denial   Key: [id number]

## 2022-11-08 ENCOUNTER — Other Ambulatory Visit (INDEPENDENT_AMBULATORY_CARE_PROVIDER_SITE_OTHER): Payer: PPO

## 2022-11-08 DIAGNOSIS — E876 Hypokalemia: Secondary | ICD-10-CM | POA: Diagnosis not present

## 2022-11-08 LAB — BASIC METABOLIC PANEL
BUN: 14 mg/dL (ref 6–23)
CO2: 30 mEq/L (ref 19–32)
Calcium: 8.4 mg/dL (ref 8.4–10.5)
Chloride: 100 mEq/L (ref 96–112)
Creatinine, Ser: 0.56 mg/dL (ref 0.40–1.20)
GFR: 92.49 mL/min (ref >60.00)
Glucose, Bld: 176 mg/dL — ABNORMAL HIGH (ref 70–99)
Potassium: 3.4 mEq/L — ABNORMAL LOW (ref 3.5–5.1)
Sodium: 139 mEq/L (ref 135–145)

## 2022-11-11 ENCOUNTER — Encounter: Payer: Self-pay | Admitting: Family Medicine

## 2022-11-11 ENCOUNTER — Encounter: Payer: Self-pay | Admitting: Internal Medicine

## 2022-11-11 DIAGNOSIS — M9902 Segmental and somatic dysfunction of thoracic region: Secondary | ICD-10-CM | POA: Diagnosis not present

## 2022-11-11 DIAGNOSIS — G4486 Cervicogenic headache: Secondary | ICD-10-CM | POA: Diagnosis not present

## 2022-11-11 DIAGNOSIS — M9901 Segmental and somatic dysfunction of cervical region: Secondary | ICD-10-CM | POA: Diagnosis not present

## 2022-11-11 DIAGNOSIS — M9908 Segmental and somatic dysfunction of rib cage: Secondary | ICD-10-CM | POA: Diagnosis not present

## 2022-11-11 DIAGNOSIS — G44209 Tension-type headache, unspecified, not intractable: Secondary | ICD-10-CM | POA: Diagnosis not present

## 2022-11-11 DIAGNOSIS — M9907 Segmental and somatic dysfunction of upper extremity: Secondary | ICD-10-CM | POA: Diagnosis not present

## 2022-11-11 DIAGNOSIS — M542 Cervicalgia: Secondary | ICD-10-CM | POA: Diagnosis not present

## 2022-11-11 DIAGNOSIS — M9903 Segmental and somatic dysfunction of lumbar region: Secondary | ICD-10-CM | POA: Diagnosis not present

## 2022-11-11 NOTE — Telephone Encounter (Signed)
Caller states: - They received the PA but are missing the things mentioned in patient's message below.  - Usually a clinical note is able to answer all the questions needed   Laretta Bolster can be reached directly at [phone]; Fax is [phone]

## 2022-11-12 NOTE — Telephone Encounter (Signed)
Caller states: - They received OV notes via fax but are still missing some info  - Need diagnosis for the medication and if anything was tried and failed prior to the medication needed  Fax number is [phone]

## 2022-11-14 DIAGNOSIS — G4485 Primary stabbing headache: Secondary | ICD-10-CM | POA: Diagnosis not present

## 2022-11-14 DIAGNOSIS — M542 Cervicalgia: Secondary | ICD-10-CM | POA: Diagnosis not present

## 2022-11-14 DIAGNOSIS — M9901 Segmental and somatic dysfunction of cervical region: Secondary | ICD-10-CM | POA: Diagnosis not present

## 2022-11-14 DIAGNOSIS — G4486 Cervicogenic headache: Secondary | ICD-10-CM | POA: Diagnosis not present

## 2022-11-14 DIAGNOSIS — M9907 Segmental and somatic dysfunction of upper extremity: Secondary | ICD-10-CM | POA: Diagnosis not present

## 2022-11-14 DIAGNOSIS — M9900 Segmental and somatic dysfunction of head region: Secondary | ICD-10-CM | POA: Diagnosis not present

## 2022-11-14 DIAGNOSIS — G44209 Tension-type headache, unspecified, not intractable: Secondary | ICD-10-CM | POA: Diagnosis not present

## 2022-11-14 DIAGNOSIS — M9908 Segmental and somatic dysfunction of rib cage: Secondary | ICD-10-CM | POA: Diagnosis not present

## 2022-11-18 ENCOUNTER — Encounter: Payer: Self-pay | Admitting: Family Medicine

## 2022-11-18 ENCOUNTER — Other Ambulatory Visit: Payer: Self-pay

## 2022-11-18 DIAGNOSIS — H8103 Meniere's disease, bilateral: Secondary | ICD-10-CM

## 2022-11-19 DIAGNOSIS — E782 Mixed hyperlipidemia: Secondary | ICD-10-CM | POA: Diagnosis not present

## 2022-11-19 DIAGNOSIS — R42 Dizziness and giddiness: Secondary | ICD-10-CM | POA: Diagnosis not present

## 2022-11-19 DIAGNOSIS — Z6827 Body mass index (BMI) 27.0-27.9, adult: Secondary | ICD-10-CM | POA: Diagnosis not present

## 2022-11-19 DIAGNOSIS — Z8639 Personal history of other endocrine, nutritional and metabolic disease: Secondary | ICD-10-CM | POA: Diagnosis not present

## 2022-11-19 DIAGNOSIS — R7301 Impaired fasting glucose: Secondary | ICD-10-CM | POA: Diagnosis not present

## 2022-11-19 DIAGNOSIS — E663 Overweight: Secondary | ICD-10-CM | POA: Diagnosis not present

## 2022-11-20 ENCOUNTER — Other Ambulatory Visit: Payer: Self-pay | Admitting: Internal Medicine

## 2022-11-20 ENCOUNTER — Other Ambulatory Visit: Payer: Self-pay | Admitting: Family Medicine

## 2022-11-20 DIAGNOSIS — M9908 Segmental and somatic dysfunction of rib cage: Secondary | ICD-10-CM | POA: Diagnosis not present

## 2022-11-20 DIAGNOSIS — M542 Cervicalgia: Secondary | ICD-10-CM | POA: Diagnosis not present

## 2022-11-20 DIAGNOSIS — M9904 Segmental and somatic dysfunction of sacral region: Secondary | ICD-10-CM | POA: Diagnosis not present

## 2022-11-20 DIAGNOSIS — G4486 Cervicogenic headache: Secondary | ICD-10-CM | POA: Diagnosis not present

## 2022-11-20 DIAGNOSIS — M9902 Segmental and somatic dysfunction of thoracic region: Secondary | ICD-10-CM | POA: Diagnosis not present

## 2022-11-20 DIAGNOSIS — M9901 Segmental and somatic dysfunction of cervical region: Secondary | ICD-10-CM | POA: Diagnosis not present

## 2022-11-20 DIAGNOSIS — G44209 Tension-type headache, unspecified, not intractable: Secondary | ICD-10-CM | POA: Diagnosis not present

## 2022-11-20 DIAGNOSIS — M9906 Segmental and somatic dysfunction of lower extremity: Secondary | ICD-10-CM | POA: Diagnosis not present

## 2022-11-20 DIAGNOSIS — M9905 Segmental and somatic dysfunction of pelvic region: Secondary | ICD-10-CM | POA: Diagnosis not present

## 2022-11-20 DIAGNOSIS — H8109 Meniere's disease, unspecified ear: Secondary | ICD-10-CM

## 2022-11-28 ENCOUNTER — Telehealth: Payer: Self-pay

## 2022-11-28 IMAGING — CR DG WRIST COMPLETE 3+V*R*
5 series · 5 of 5 positions shown · non-contrast
Comparison: None.

CLINICAL DATA: Right wrist pain for 1 month.  No specific injury.

EXAM:
RIGHT WRIST - COMPLETE 3+ VIEW

[x wrist pa right]
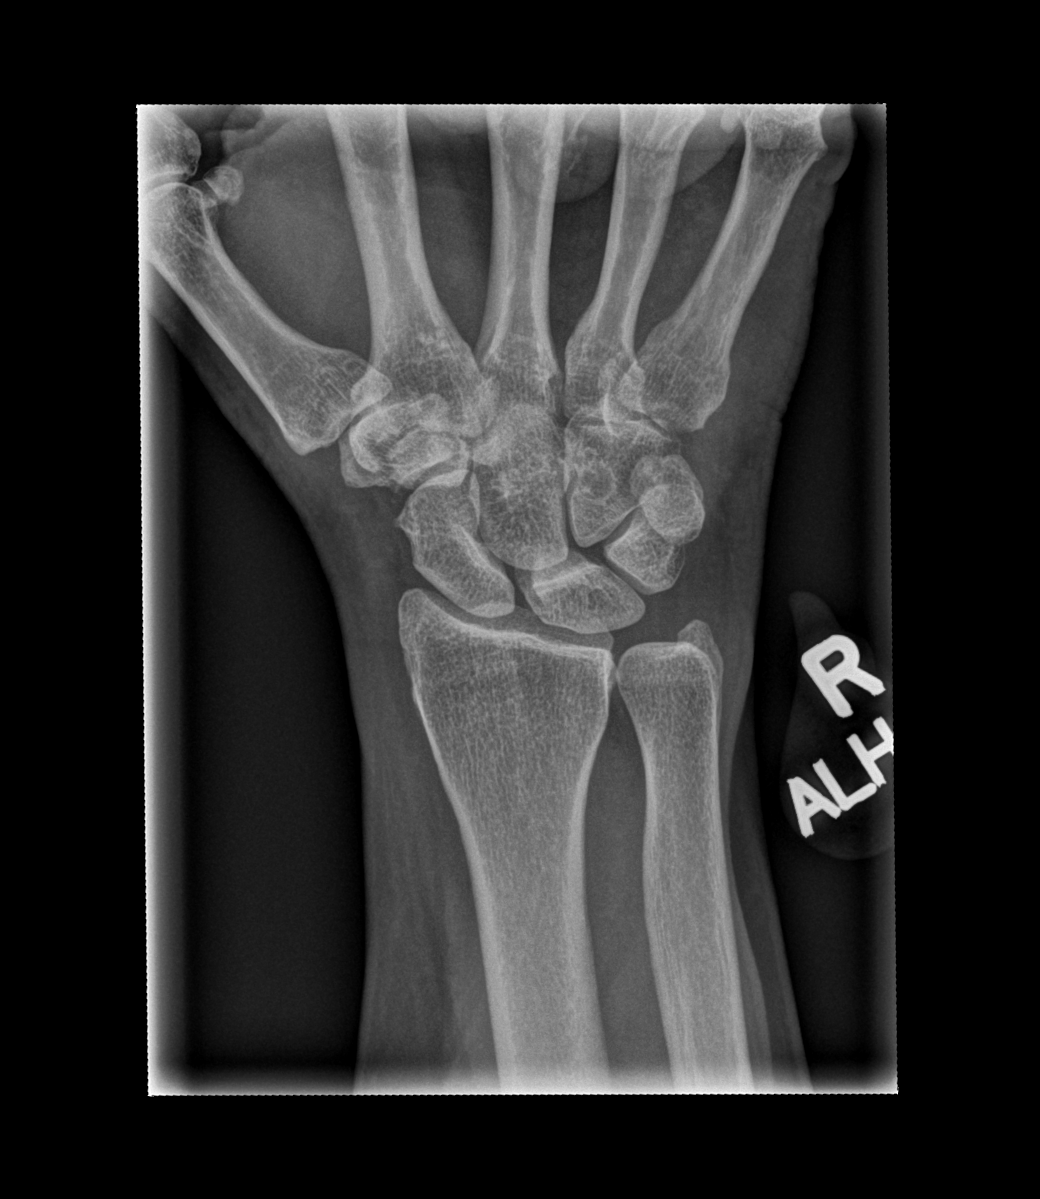

[x wrist navicular view right]
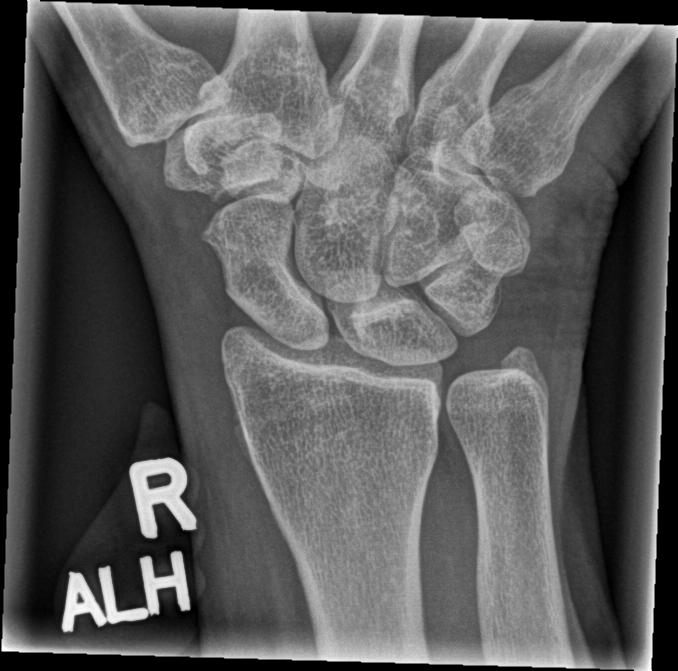

[x wrist obl right]
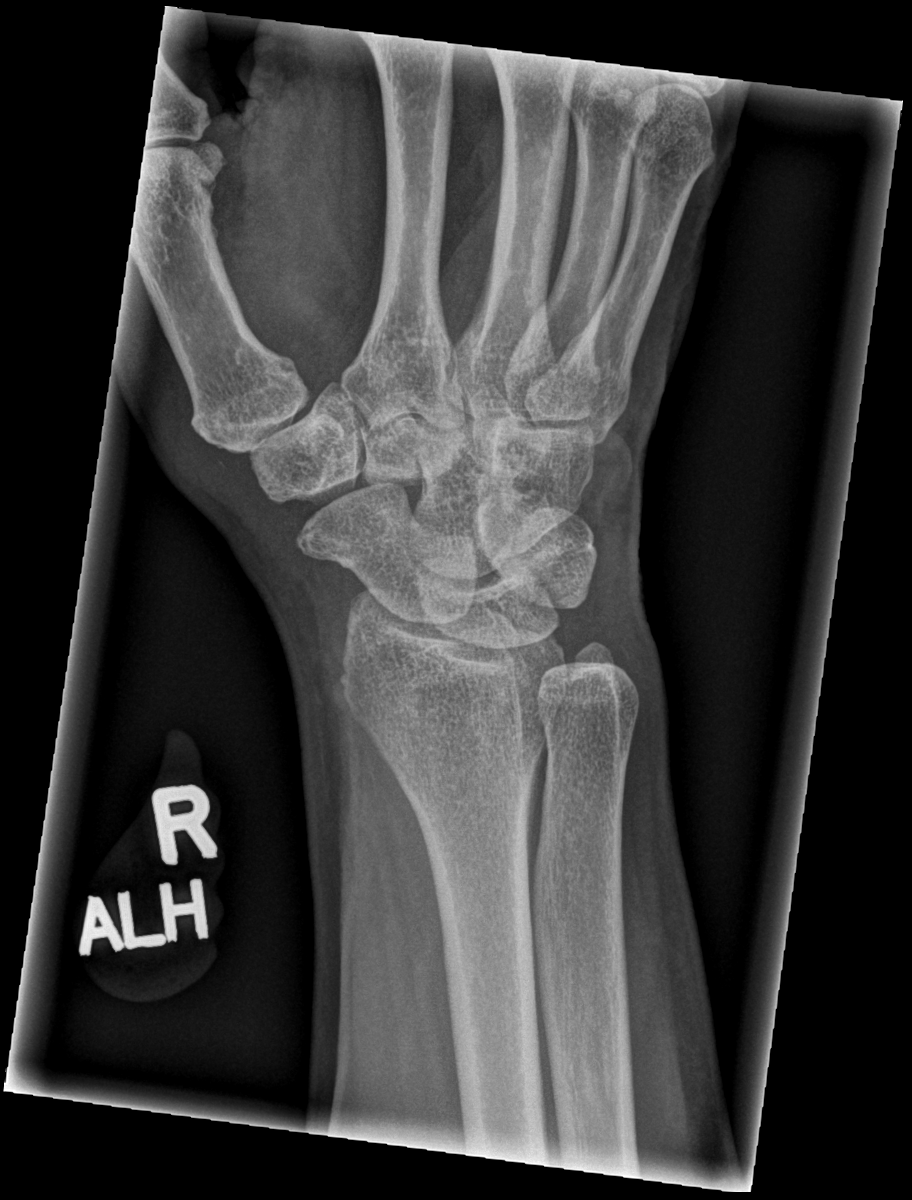

[x wrist lat right (1 of 2)]
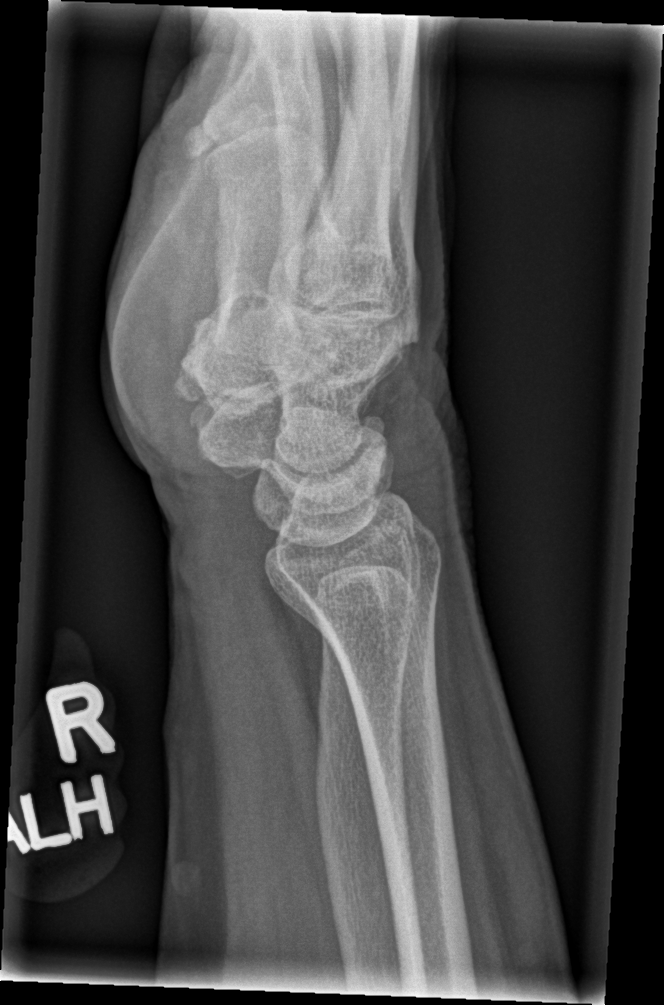

[x wrist lat right (2 of 2)]
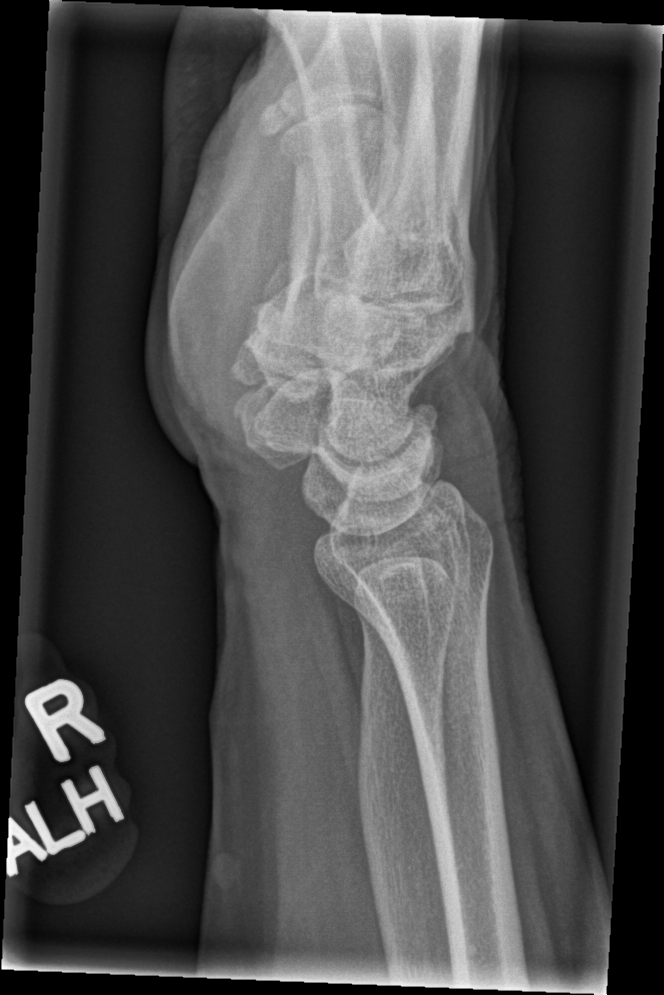

[5 of 5 positions shown; findings below may reference images not displayed]

FINDINGS: The joint spaces are maintained. No acute bony findings, erosive
changes or chondrocalcinosis. There are mild to moderate
degenerative changes at the CMC joint of the thumb and mild
degenerative changes at the scaphoid articulation with the trapezoid
and trapezium bones.
IMPRESSION: 1. No acute bony findings.
2. Mild degenerative changes but no findings for erosive
arthropathy.

## 2022-11-28 NOTE — Telephone Encounter (Signed)
Contacted Mary Hernandez to schedule their annual wellness visit. Appointment made for 12/02/22.  Norton Blizzard, [place] ([facility])  [facility] [phone]

## 2022-12-02 ENCOUNTER — Ambulatory Visit (INDEPENDENT_AMBULATORY_CARE_PROVIDER_SITE_OTHER): Payer: PPO

## 2022-12-02 VITALS — Wt 178.0 lb

## 2022-12-02 DIAGNOSIS — M9906 Segmental and somatic dysfunction of lower extremity: Secondary | ICD-10-CM | POA: Diagnosis not present

## 2022-12-02 DIAGNOSIS — M79672 Pain in left foot: Secondary | ICD-10-CM | POA: Diagnosis not present

## 2022-12-02 DIAGNOSIS — H8109 Meniere's disease, unspecified ear: Secondary | ICD-10-CM | POA: Diagnosis not present

## 2022-12-02 DIAGNOSIS — M25551 Pain in right hip: Secondary | ICD-10-CM | POA: Diagnosis not present

## 2022-12-02 DIAGNOSIS — M9901 Segmental and somatic dysfunction of cervical region: Secondary | ICD-10-CM | POA: Diagnosis not present

## 2022-12-02 DIAGNOSIS — Z0000 Encounter for general adult medical examination without abnormal findings: Secondary | ICD-10-CM

## 2022-12-02 DIAGNOSIS — M9905 Segmental and somatic dysfunction of pelvic region: Secondary | ICD-10-CM | POA: Diagnosis not present

## 2022-12-02 DIAGNOSIS — M9904 Segmental and somatic dysfunction of sacral region: Secondary | ICD-10-CM | POA: Diagnosis not present

## 2022-12-02 NOTE — Patient Instructions (Signed)
[name] , Thank you for taking time to come for your Medicare Wellness Visit. I appreciate your ongoing commitment to your health goals. Please review the following plan we discussed and let me know if I can assist you in the future.   These are the goals we discussed:  Goals      eat healthy and exercise     Patient Stated     Losing weight      Patient Stated     Lose weight      Track and Manage My Symptoms-Depression     Timeframe:  Long-Range Goal Priority:  High Start Date:  06/05/21                           Expected End Date:   02/30/22                    Follow Up Date 09/05/21    - exercise at least 2 to 3 times per week    Why is this important?   Keeping track of your progress will help your treatment team find the right mix of medicine and therapy for you.  Write in your journal every day.  Day-to-day changes in depression symptoms are normal. It may be more helpful to check your progress at the end of each week instead of every day.     Notes:         This is a list of the screening recommended for you and due dates:  Health Maintenance  Topic Date Due   COVID-19 Vaccine (7 - 2023-24 season) 12/31/2022   DEXA scan (bone density measurement)  05/29/2023   Mammogram  07/05/2023   Colon Cancer Screening  08/11/2023   Medicare Annual Wellness Visit  12/03/2023   DTaP/Tdap/Td vaccine (2 - Td or Tdap) 06/19/2027   Pneumonia Vaccine  Completed   Flu Shot  Completed   Hepatitis C Screening: USPSTF Recommendation to screen - Ages 18-79 yo.  Completed   Zoster (Shingles) Vaccine  Completed   HPV Vaccine  Aged Out    Advanced directives: copies in chart   Conditions/risks identified: lose weight   Next appointment: Follow up in one year for your annual wellness visit    Preventive Care 65 Years and Older, Female Preventive care refers to lifestyle choices and visits with your health care provider that can promote health and wellness. What does preventive  care include? A yearly physical exam. This is also called an annual well check. Dental exams once or twice a year. Routine eye exams. Ask your health care provider how often you should have your eyes checked. Personal lifestyle choices, including: Daily care of your teeth and gums. Regular physical activity. Eating a healthy diet. Avoiding tobacco and drug use. Limiting alcohol use. Practicing safe sex. Taking low-dose aspirin every day. Taking vitamin and mineral supplements as recommended by your health care provider. What happens during an annual well check? The services and screenings done by your health care provider during your annual well check will depend on your age, overall health, lifestyle risk factors, and family history of disease. Counseling  Your health care provider may ask you questions about your: Alcohol use. Tobacco use. Drug use. Emotional well-being. Home and relationship well-being. Sexual activity. Eating habits. History of falls. Memory and ability to understand (cognition). Work and work Statistician. Reproductive health. Screening  You may have the following tests or measurements: Height,  weight, and BMI. Blood pressure. Lipid and cholesterol levels. These may be checked every 5 years, or more frequently if you are over 39 years old. Skin check. Lung cancer screening. You may have this screening every year starting at age 61 if you have a 30-pack-year history of smoking and currently smoke or have quit within the past 15 years. Fecal occult blood test (FOBT) of the stool. You may have this test every year starting at age 81. Flexible sigmoidoscopy or colonoscopy. You may have a sigmoidoscopy every 5 years or a colonoscopy every 10 years starting at age 46. Hepatitis C blood test. Hepatitis B blood test. Sexually transmitted disease (STD) testing. Diabetes screening. This is done by checking your blood sugar (glucose) after you have not eaten for a  while (fasting). You may have this done every 1-3 years. Bone density scan. This is done to screen for osteoporosis. You may have this done starting at age 60. Mammogram. This may be done every 1-2 years. Talk to your health care provider about how often you should have regular mammograms. Talk with your health care provider about your test results, treatment options, and if necessary, the need for more tests. Vaccines  Your health care provider may recommend certain vaccines, such as: Influenza vaccine. This is recommended every year. Tetanus, diphtheria, and acellular pertussis (Tdap, Td) vaccine. You may need a Td booster every 10 years. Zoster vaccine. You may need this after age 54. Pneumococcal 13-valent conjugate (PCV13) vaccine. One dose is recommended after age 90. Pneumococcal polysaccharide (PPSV23) vaccine. One dose is recommended after age 88. Talk to your health care provider about which screenings and vaccines you need and how often you need them. This information is not intended to replace advice given to you by your health care provider. Make sure you discuss any questions you have with your health care provider. Document Released: 10/20/2015 Document Revised: 06/12/2016 Document Reviewed: 07/25/2015 Elsevier Interactive Patient Education  2017 [place] Prevention in the Home Falls can cause injuries. They can happen to people of all ages. There are many things you can do to make your home safe and to help prevent falls. What can I do on the outside of my home? Regularly fix the edges of walkways and driveways and fix any cracks. Remove anything that might make you trip as you walk through a door, such as a raised step or threshold. Trim any bushes or trees on the path to your home. Use bright outdoor lighting. Clear any walking paths of anything that might make someone trip, such as rocks or tools. Regularly check to see if handrails are loose or broken. Make  sure that both sides of any steps have handrails. Any raised decks and porches should have guardrails on the edges. Have any leaves, snow, or ice cleared regularly. Use sand or salt on walking paths during winter. Clean up any spills in your garage right away. This includes oil or grease spills. What can I do in the bathroom? Use night lights. Install grab bars by the toilet and in the tub and shower. Do not use towel bars as grab bars. Use non-skid mats or decals in the tub or shower. If you need to sit down in the shower, use a plastic, non-slip stool. Keep the floor dry. Clean up any water that spills on the floor as soon as it happens. Remove soap buildup in the tub or shower regularly. Attach bath mats securely with double-sided non-slip rug tape.  Do not have throw rugs and other things on the floor that can make you trip. What can I do in the bedroom? Use night lights. Make sure that you have a light by your bed that is easy to reach. Do not use any sheets or blankets that are too big for your bed. They should not hang down onto the floor. Have a firm chair that has side arms. You can use this for support while you get dressed. Do not have throw rugs and other things on the floor that can make you trip. What can I do in the kitchen? Clean up any spills right away. Avoid walking on wet floors. Keep items that you use a lot in easy-to-reach places. If you need to reach something above you, use a strong step stool that has a grab bar. Keep electrical cords out of the way. Do not use floor polish or wax that makes floors slippery. If you must use wax, use non-skid floor wax. Do not have throw rugs and other things on the floor that can make you trip. What can I do with my stairs? Do not leave any items on the stairs. Make sure that there are handrails on both sides of the stairs and use them. Fix handrails that are broken or loose. Make sure that handrails are as long as the  stairways. Check any carpeting to make sure that it is firmly attached to the stairs. Fix any carpet that is loose or worn. Avoid having throw rugs at the top or bottom of the stairs. If you do have throw rugs, attach them to the floor with carpet tape. Make sure that you have a light switch at the top of the stairs and the bottom of the stairs. If you do not have them, ask someone to add them for you. What else can I do to help prevent falls? Wear shoes that: Do not have high heels. Have rubber bottoms. Are comfortable and fit you well. Are closed at the toe. Do not wear sandals. If you use a stepladder: Make sure that it is fully opened. Do not climb a closed stepladder. Make sure that both sides of the stepladder are locked into place. Ask someone to hold it for you, if possible. Clearly mark and make sure that you can see: Any grab bars or handrails. First and last steps. Where the edge of each step is. Use tools that help you move around (mobility aids) if they are needed. These include: Canes. Walkers. Scooters. Crutches. Turn on the lights when you go into a dark area. Replace any light bulbs as soon as they burn out. Set up your furniture so you have a clear path. Avoid moving your furniture around. If any of your floors are uneven, fix them. If there are any pets around you, be aware of where they are. Review your medicines with your doctor. Some medicines can make you feel dizzy. This can increase your chance of falling. Ask your doctor what other things that you can do to help prevent falls. This information is not intended to replace advice given to you by your health care provider. Make sure you discuss any questions you have with your health care provider. Document Released: 07/20/2009 Document Revised: 02/29/2016 Document Reviewed: 10/28/2014 Elsevier Interactive Patient Education  2017 [employer].

## 2022-12-02 NOTE — Progress Notes (Signed)
Subjective:   Mary Hernandez is a 72 y.o. female who presents for Medicare Annual (Subsequent) preventive examination.  Review of Systems     Cardiac Risk Factors include: advanced age (>24mn, >>73women);obesity (BMI >30kg/m2);dyslipidemia     Objective:    Today's Vitals   12/02/22 1329  Weight: 178 lb (80.7 kg)   Body mass index is 30.55 kg/m.     12/02/2022    1:40 PM 02/20/2022    7:37 AM 11/19/2021    1:12 PM 11/13/2020    2:07 PM 10/07/2019   10:17 AM 11/15/2018    9:41 PM  Advanced Directives  Does Patient Have a Medical Advance Directive? Yes Yes Yes Yes Yes No  Type of AParamed[place]of [place]arned[place] will Healthcare Power of [place] Attorney Living will;Healthcare Power of Attorney   Does patient want to make changes to medical advance directive? No - Patient declined No - Patient declined   No - Patient declined   Copy of [place] Chart? Yes - validated most recent copy scanned in chart (See row information) Yes - validated most recent copy scanned in chart (See row information) Yes - validated most recent copy scanned in chart (See row information) Yes - validated most recent copy scanned in chart (See row information) Yes - validated most recent copy scanned in chart (See row information)   Would patient like information on creating a medical advance directive?      No - Patient declined    Current Medications (verified) Outpatient Encounter Medications as of 12/02/2022  Medication Sig   Calcium Citrate-Vitamin D 315-250 MG-UNIT TABS Take by mouth.   diclofenac Sodium (VOLTAREN) 1 % GEL Apply 2 g topically 4 (four) times daily.   EVENING PRIMROSE OIL PO Take 1,500 mg by mouth 2 (two) times daily.   flecainide (TAMBOCOR) 50 MG tablet TAKE 1 AND 1/2 TABLET BY MOUTH TWO TIMES A DAY   fluticasone (FLONASE) 50 MCG/ACT nasal spray SPRAY ONE SPRAY IN EACH NOSTRIL ONCE DAILY    Glucosamine-Chondroit-Vit C-Mn (GLUCOSAMINE 1500 COMPLEX) CAPS Take by mouth.   glycopyrrolate (ROBINUL) 1 MG tablet TAKE 1 TABLET BY MOUTH TWICE A DAY   hydrochlorothiazide (MICROZIDE) 12.5 MG capsule Take 1 capsule (12.5 mg total) by mouth daily. (Patient taking differently: Take 12.5 mg by mouth every other day.)   levothyroxine (SYNTHROID) 100 MCG tablet TAKE ONE TABLET BY MOUTH DAILY   MAGNESIUM PO Take by mouth. Zinc and vit d   meclizine (ANTIVERT) 25 MG tablet Take 25 mg by mouth 3 (three) times daily as needed for dizziness.   meloxicam (MOBIC) 7.5 MG tablet Take 7.5 mg by mouth as needed.   montelukast (SINGULAIR) 10 MG tablet Take 1 tablet (10 mg total) by mouth at bedtime.   naltrexone (DEPADE) 50 MG tablet Take 50 mg by mouth in the morning and at bedtime.   omega-3 acid ethyl esters (LOVAZA) 1 g capsule Take 1 g by mouth 2 (two) times daily.   ondansetron (ZOFRAN-ODT) 4 MG disintegrating tablet Take 4 mg by mouth every 8 (eight) hours as needed for nausea or vomiting.   Probiotic Product (PROBIOTIC-10 PO) Take 1 capsule by mouth daily.   rosuvastatin (CRESTOR) 10 MG tablet Take 1 tablet (10 mg total) by mouth daily.   Semaglutide, 2 MG/DOSE, (OZEMPIC, 2 MG/DOSE,) 8 MG/3ML SOPN Inject 2 mg into the skin once a week. (Patient taking differently: Inject 2  mg into the skin once a week. 1.5)   sertraline (ZOLOFT) 50 MG tablet Take 2 tablets (100 mg total) by mouth daily.   Turmeric 400 MG CAPS Take 3 capsules by mouth daily.   Potassium Chloride ER 20 MEQ TBCR Take 1 tablet by mouth daily. (Patient not taking: Reported on 12/02/2022)   TART CHERRY PO Take 1 tablet by mouth daily. (Patient not taking: Reported on 12/02/2022)   No facility-administered encounter medications on file as of 12/02/2022.    Allergies (verified) Seasonal ic [cholestatin] and Adhesive [tape]   History: Past Medical History:  Diagnosis Date   Acquired hypothyroidism 03/01/2018   hx of goiter; s/p  thyroidectomy   Anxiety    Atrial premature depolarization    BPPV (benign paroxysmal positional vertigo) 03/01/2018   Colon polyps    Complication of anesthesia    takes longer to wake up   Essential hypertension 03/01/2018   denies hx - on diuretic for Meniere's and beta blocker for PVCs   Frequent PVCs 03/01/2018   Controlled on Flecainide, Acebutolol   Gallbladder problem    GERD (gastroesophageal reflux disease)    Hip pain    History of cardiac catheterization    Nuc 7/16:  normal perfusion; EF 76 // LHC 8/16:  normal coronary arteries   History of depression    History of dizziness    History of echocardiogram    Echo 03/2006:  Normal LVEF   History of stomach ulcers    HLD (hyperlipidemia)    Hypothyroidism    IBS (irritable bowel syndrome)    Infertility, female    Insulin resistance    Lower back pain    Meniere's disease    Notalgia paresthetica    PONV (postoperative nausea and vomiting)    Prediabetes    PVC (premature ventricular contraction)    Vertigo    Vitamin D deficiency    Past Surgical History:  Procedure Laterality Date   BLADDER SURGERY  1997   BREAST BIOPSY  2013   BREAST REDUCTION SURGERY  1999   CARDIAC CATHETERIZATION  05/2015   Carpel Tunnel Release     CHOLECYSTECTOMY  2011   COLONOSCOPY  08/20/2015   ESOPHAGOGASTRODUODENOSCOPY  01/13/2017   FOOT NEUROMA SURGERY  1986   REDUCTION MAMMAPLASTY     REFRACTIVE SURGERY     S/P Eye Right 1967   Muscle Clipped    THYROIDECTOMY  2012   TONSILLECTOMY  1975   TOTAL ABDOMINAL HYSTERECTOMY  1996   TRIGGER FINGER RELEASE Right 02/20/2022   Procedure: RIGHT MIDDLE AND RING FINGER RELEASE TRIGGER FINGER/A-1 PULLEY;  Surgeon: Sherilyn Cooter, MD;  Location: [place];  Service: Orthopedics;  Laterality: Right;   Family History  Problem Relation Age of Onset   Breast cancer Mother    Hyperlipidemia Mother    Thyroid disease Mother    Cancer Mother    Heart attack Father     Pulmonary fibrosis Father    Peripheral Artery Disease Father        s/p CEA   Heart disease Father    Hyperlipidemia Father    Early death Sister    Depression Brother    Hearing loss Brother    Diabetes Daughter    Heart disease Paternal Grandmother    Colon cancer Neg Hx    Stomach cancer Neg Hx    Social History   Socioeconomic History   Marital status: Married    Spouse name: Not on file  Number of children: 2   Years of education: Not on file   Highest education level: Not on file  Occupational History   Occupation: Retired Educational psychologist  Tobacco Use   Smoking status: Never   Smokeless tobacco: Never  Vaping Use   Vaping Use: Never used  Substance and Sexual Activity   Alcohol use: Yes    Comment: very limited    Drug use: Never   Sexual activity: Not on file  Other Topics Concern   Not on file  Social History Narrative   Retired Licensed conveyancer   Born [place] in [place] for 83 years   Moved to [place] in 2019   Twin daughters (one daughter is diabetic Tourist information centre manager)    Social Determinants of Health   Financial Resource Strain: Low Risk  (12/02/2022)   Overall Financial Resource Strain (CARDIA)    Difficulty of Paying Living Expenses: Not hard at all  Food Insecurity: No Food Insecurity (12/02/2022)   Hunger Vital Sign    Worried About Running Out of Food in the Last Year: Never true    [place] in the Last Year: Never true  Transportation Needs: No Transportation Needs (12/02/2022)   PRAPARE - Hydrologist (Medical): No    Lack of Transportation (Non-Medical): No  Physical Activity: Sufficiently Active (12/02/2022)   Exercise Vital Sign    Days of Exercise per Week: 5 days    Minutes of Exercise per Session: 60 min  Stress: No Stress Concern Present (12/02/2022)   [place]    Feeling of Stress : Not  at all  Social Connections: [place] (12/02/2022)   Social Connection and Isolation Panel [NHANES]    Frequency of Communication with Friends and Family: More than three times a week    Frequency of Social Gatherings with Friends and Family: More than three times a week    Attends Religious Services: More than 4 times per year    Active Member of [employer] or Organizations: Yes    Attends Archivist Meetings: 1 to 4 times per year    Marital Status: Married    Tobacco Counseling Counseling given: Not Answered   Clinical Intake:  Pre-visit preparation completed: Yes  Pain : No/denies pain     BMI - recorded: 30.55 Nutritional Status: BMI > 30  Obese Nutritional Risks: None Diabetes: No  How often do you need to have someone help you when you read instructions, pamphlets, or other written materials from your doctor or pharmacy?: 1 - Never  Diabetic?no  Interpreter Needed?: No  Information entered by :: Charlott Rakes, LPN   Activities of Daily Living    12/02/2022    1:41 PM 02/20/2022    7:42 AM  In your present state of health, do you have any difficulty performing the following activities:  Hearing? 0 0  Vision? 0 0  Difficulty concentrating or making decisions? 0 0  Walking or climbing stairs? 0 0  Dressing or bathing? 0 0  Doing errands, shopping? 0   Preparing Food and eating ? N   Using the Toilet? N   In the past six months, have you accidently leaked urine? N   Do you have problems with loss of bowel control? N   Managing your Medications? N   Managing your Finances? N   Housekeeping or managing  your Housekeeping? N     Patient Care Team: Leamon Arnt, MD as PCP - General (Family Medicine) Deboraha Sprang, MD as PCP - Electrophysiology (Cardiology) Gerda Diss, DO as Consulting Physician (Sports Medicine) Debbra Riding, MD as Consulting Physician (Ophthalmology) Deboraha Sprang, MD as Consulting Physician  (Cardiology) Magnus Sinning, MD as Consulting Physician (Physical Medicine and Rehabilitation) Edythe Clarity, [facility] (Pharmacist)  Indicate any recent Medical Services you may have received from other than [facility] providers in the past year (date may be approximate).     Assessment:   This is a routine wellness examination for [facility].  Hearing/Vision screen Hearing Screening - Comments:: Pt denies any hearing issues  Vision Screening - Comments:: Pt follows up with Dr Wyatt Portela for annual eye exams   Dietary issues and exercise activities discussed: Current Exercise Habits: Home exercise routine, Type of exercise: walking;Other - see comments (water aerobic)   Goals Addressed             This Visit's Progress    eat healthy and exercise         Depression Screen    12/02/2022    1:38 PM 09/05/2022   11:18 AM 07/25/2022    1:09 PM 11/19/2021    1:10 PM 09/03/2021    3:35 PM 11/13/2020    2:05 PM 07/03/2020   11:14 AM  PHQ 2/9 Scores  PHQ - 2 Score 0 0 0 0 0 0 3  PHQ- 9 Score       9    Fall Risk    12/02/2022    1:40 PM 09/05/2022   11:17 AM 07/25/2022    1:09 PM 11/19/2021    1:12 PM 09/03/2021    3:35 PM  Fall Risk   Falls in the past year?  0 0 0 0  Number falls in past yr:  0 0 0   Injury with Fall?  0 0 0   Risk for fall due to : Impaired vision No Fall Risks No Fall Risks Impaired vision   Follow up Falls prevention discussed Falls evaluation completed Falls evaluation completed Falls prevention discussed     FALL RISK PREVENTION PERTAINING TO THE HOME:  Any stairs in or around the home? Yes  If so, are there any without handrails? No  Home free of loose throw rugs in walkways, pet beds, electrical cords, etc? Yes  Adequate lighting in your home to reduce risk of falls? Yes   ASSISTIVE DEVICES UTILIZED TO PREVENT FALLS:  Life alert? No  Use of a cane, walker or w/c? No  Grab bars in the bathroom? Yes  Shower chair or bench in shower? Yes   Elevated toilet seat or a handicapped toilet? No   TIMED UP AND GO:  Was the test performed? No .  Cognitive Function:        12/02/2022    1:42 PM 11/19/2021    1:14 PM 11/13/2020    2:11 PM  6CIT Screen  What Year? 0 points 0 points 0 points  What month? 0 points 0 points 0 points  What time? 0 points 0 points   Count back from 20 0 points 0 points 0 points  Months in reverse 0 points 0 points 0 points  Repeat phrase 0 points 0 points 0 points  Total Score 0 points 0 points     Immunizations Immunization History  Administered Date(s) Administered   Fluad Quad(high Dose 65+) 06/08/2019, 06/21/2020, 07/17/2021, 06/28/2022  Hepatitis A 06/18/2004   Hepatitis B 06/18/2004   Influenza, High Dose Seasonal PF 06/16/2018   Moderna Covid-19 Vaccine Bivalent Booster 97yr & up 02/01/2021, 11/05/2022   PFIZER(Purple Top)SARS-COV-2 Vaccination 11/14/2019, 12/09/2019, 07/07/2020   PNEUMOCOCCAL CONJUGATE-20 07/17/2022   Pfizer Covid-19 Vaccine Bivalent Booster 139yr& up 07/30/2021   Pneumococcal Conjugate-13 09/14/2015   Pneumococcal Polysaccharide-23 06/10/2017   Respiratory Syncytial Virus Vaccine,Recomb Aduvanted(Arexvy) 07/05/2022   Tdap 06/18/2017   Zoster Recombinat (Shingrix) 06/18/2017, 11/06/2017   Zoster, Live 08/22/2010    TDAP status: Up to date  Flu Vaccine status: Up to date  Pneumococcal vaccine status: Up to date  Covid-19 vaccine status: Completed vaccines  Qualifies for Shingles Vaccine? Yes   Zostavax completed Yes   Shingrix Completed?: Yes  Screening Tests Health Maintenance  Topic Date Due   COVID-19 Vaccine (7 - 2023-24 season) 12/31/2022   DEXA SCAN  05/29/2023   MAMMOGRAM  07/05/2023   COLONOSCOPY (Pts [id number] coverage will need to be confirmed)  08/11/2023   Medicare Annual Wellness (AWV)  12/03/2023   DTaP/Tdap/Td (2 - Td or Tdap) 06/19/2027   Pneumonia Vaccine 65+81ears old  Completed   INFLUENZA VACCINE  Completed    Hepatitis C Screening  Completed   Zoster Vaccines- Shingrix  Completed   HPV VACCINES  Aged Out    Health Maintenance  There are no preventive care reminders to display for this patient.   Colorectal cancer screening: Type of screening: Colonoscopy. Completed 08/10/18. Repeat every 5 years  Mammogram status: Completed 07/04/22. Repeat every year  Bone Density status: Completed 05/28/18. Results reflect: Bone density results: NORMAL. Repeat every 5 years.   Additional Screening:  Hepatitis C Screening:  Completed 05/01/18  Vision Screening: Recommended annual ophthalmology exams for early detection of glaucoma and other disorders of the eye. Is the patient up to date with their annual eye exam?  Yes  Who is the provider or what is the name of the office in which the patient attends annual eye exams? Dr ScoWyatt Portelaf pt is not established with a provider, would they like to be referred to a provider to establish care? No .   Dental Screening: Recommended annual dental exams for proper oral hygiene  Community Resource Referral / Chronic Care Management: CRR required this visit?  No   CCM required this visit?  No      Plan:     I have personally reviewed and noted the following in the patient's chart:   Medical and social history Use of alcohol, tobacco or illicit drugs  Current medications and supplements including opioid prescriptions. Patient is not currently taking opioid prescriptions. Functional ability and status Nutritional status Physical activity Advanced directives List of other physicians Hospitalizations, surgeries, and ER visits in previous 12 months Vitals Screenings to include cognitive, depression, and falls Referrals and appointments  In addition, I have reviewed and discussed with patient certain preventive protocols, quality metrics, and best practice recommendations. A written personalized care plan for preventive services as well as general  preventive health recommendations were provided to patient.     TinWillette BracePN   [id number] Notes: none

## 2022-12-09 ENCOUNTER — Other Ambulatory Visit: Payer: Self-pay | Admitting: Family Medicine

## 2022-12-16 DIAGNOSIS — M79672 Pain in left foot: Secondary | ICD-10-CM | POA: Diagnosis not present

## 2022-12-16 DIAGNOSIS — M9906 Segmental and somatic dysfunction of lower extremity: Secondary | ICD-10-CM | POA: Diagnosis not present

## 2022-12-16 DIAGNOSIS — M9907 Segmental and somatic dysfunction of upper extremity: Secondary | ICD-10-CM | POA: Diagnosis not present

## 2022-12-16 DIAGNOSIS — M25551 Pain in right hip: Secondary | ICD-10-CM | POA: Diagnosis not present

## 2022-12-16 DIAGNOSIS — M9908 Segmental and somatic dysfunction of rib cage: Secondary | ICD-10-CM | POA: Diagnosis not present

## 2022-12-16 DIAGNOSIS — M71571 Other bursitis, not elsewhere classified, right ankle and foot: Secondary | ICD-10-CM | POA: Diagnosis not present

## 2022-12-16 DIAGNOSIS — M9905 Segmental and somatic dysfunction of pelvic region: Secondary | ICD-10-CM | POA: Diagnosis not present

## 2022-12-16 DIAGNOSIS — M9901 Segmental and somatic dysfunction of cervical region: Secondary | ICD-10-CM | POA: Diagnosis not present

## 2022-12-16 DIAGNOSIS — G4486 Cervicogenic headache: Secondary | ICD-10-CM | POA: Diagnosis not present

## 2022-12-16 DIAGNOSIS — M9904 Segmental and somatic dysfunction of sacral region: Secondary | ICD-10-CM | POA: Diagnosis not present

## 2022-12-17 DIAGNOSIS — Z8639 Personal history of other endocrine, nutritional and metabolic disease: Secondary | ICD-10-CM | POA: Diagnosis not present

## 2022-12-17 DIAGNOSIS — R7309 Other abnormal glucose: Secondary | ICD-10-CM | POA: Diagnosis not present

## 2022-12-17 DIAGNOSIS — E663 Overweight: Secondary | ICD-10-CM | POA: Diagnosis not present

## 2022-12-17 DIAGNOSIS — E782 Mixed hyperlipidemia: Secondary | ICD-10-CM | POA: Diagnosis not present

## 2022-12-17 DIAGNOSIS — J302 Other seasonal allergic rhinitis: Secondary | ICD-10-CM | POA: Diagnosis not present

## 2022-12-18 ENCOUNTER — Encounter: Payer: Self-pay | Admitting: Family Medicine

## 2022-12-18 ENCOUNTER — Ambulatory Visit (INDEPENDENT_AMBULATORY_CARE_PROVIDER_SITE_OTHER): Payer: PPO | Admitting: Family Medicine

## 2022-12-18 VITALS — BP 110/60 | HR 75 | Temp 97.7°F | Ht 64.0 in | Wt 176.4 lb

## 2022-12-18 DIAGNOSIS — I493 Ventricular premature depolarization: Secondary | ICD-10-CM

## 2022-12-18 DIAGNOSIS — E876 Hypokalemia: Secondary | ICD-10-CM | POA: Diagnosis not present

## 2022-12-18 DIAGNOSIS — H8103 Meniere's disease, bilateral: Secondary | ICD-10-CM | POA: Diagnosis not present

## 2022-12-18 LAB — BASIC METABOLIC PANEL
BUN: 17 mg/dL (ref 6–23)
CO2: 29 mEq/L (ref 19–32)
Calcium: 8.7 mg/dL (ref 8.4–10.5)
Chloride: 103 mEq/L (ref 96–112)
Creatinine, Ser: 0.5 mg/dL (ref 0.40–1.20)
GFR: 94.98 mL/min (ref >60.00)
Glucose, Bld: 93 mg/dL (ref 70–99)
Potassium: 3.6 mEq/L (ref 3.5–5.1)
Sodium: 139 mEq/L (ref 135–145)

## 2022-12-18 LAB — MAGNESIUM: Magnesium: 1.8 mg/dL (ref 1.5–2.5)

## 2022-12-18 MED ORDER — GLYCOPYRROLATE 1 MG PO TABS: 1.0000 mg | ORAL_TABLET | Freq: Two times a day (BID) | ORAL | 3 refills | Status: AC

## 2022-12-18 MED ORDER — HYDROCHLOROTHIAZIDE 12.5 MG PO CAPS: 12.5000 mg | ORAL_CAPSULE | ORAL | Status: AC

## 2022-12-18 NOTE — Patient Instructions (Signed)
Please return in October for your annual complete physical; please come fasting.   I will release your lab results to you on your MyChart account with further instructions. You may see the results before I do, but when I review them I will send you a message with my report or have my assistant call you if things need to be discussed. Please reply to my message with any questions. Thank you!   If you have any questions or concerns, please don't hesitate to send me a message via MyChart or call the office at [phone]. Thank you for visiting with Mary Hernandez today! It's our pleasure caring for you.

## 2022-12-18 NOTE — Progress Notes (Signed)
Subjective  CC:  Chief Complaint  Patient presents with   potassium    HPI: Mary Hernandez is a 72 y.o. female who presents to the office today to address the problems listed above in the chief complaint. Patient returns to follow-up on use of diuretic and potassium management.  I reviewed notes from Dr. Caryl Comes.  Reviewed recent lab work.  She has Mnire's disease chronically, had been maintained on Dyazide her blood pressures were running low.  Changed to hydrochlorothiazide 12.5 daily.  Fortunately has not had a resurgence of her Mnire's symptoms.  Was supplementing potassium, cardiology asked her to stop potassium and we are trying to figure out if she still needs it.  Blood pressures running low on 12.5 mg of hydrochlorothiazide daily, 90s over 60s without symptoms mainly.  Now taking hydrochlorothiazide 12.5 mg every other day for the last 3 weeks.  Seems stable.  Has been off potassium supplements since February 2. She has an appointment with a new ENT doctor, Dr. Redmond Baseman in April. Frequent PVCs are controlled on flecainide twice daily  No visits with results within 1 Day(s) from this visit.  Latest known visit with results is:  Lab on 11/08/2022  Component Date Value Ref Range Status   Sodium 11/08/2022 139  135 - 145 mEq/L Final   Potassium 11/08/2022 3.4 (L)  3.5 - 5.1 mEq/L Final   Chloride 11/08/2022 100  96 - 112 mEq/L Final   CO2 11/08/2022 30  19 - 32 mEq/L Final   Glucose, Bld 11/08/2022 176 (H)  70 - 99 mg/dL Final   BUN 11/08/2022 14  6 - 23 mg/dL Final   Creatinine, Ser 11/08/2022 0.56  0.40 - 1.20 mg/dL Final   GFR 11/08/2022 92.49  >60.00 mL/min Final   Calcium 11/08/2022 8.4  8.4 - 10.5 mg/dL Final    Assessment  1. Meniere's disease of both ears   2. Hypokalemia   3. Frequent PVCs      Plan   Mnire's disease and hypokalemia f/u: Tolerating hydrochlorothiazide 12.5 mg every other day.  Will check potassium today.  Eating potassium rich food.   Hopefully will not need potassium supplements.  She will follow-up with ENT for further recommendations regarding management strategies.  Blood pressure has improved on the lower dose and every other day dosing.  She feels physically well. PVCs are well controlled on flecainide.  She sees EPS every 6 months.  Follow up: October for complete physical  Orders Placed This Encounter  Procedures   Basic metabolic panel   Magnesium   Meds ordered this encounter  Medications   glycopyrrolate (ROBINUL) 1 MG tablet    Sig: Take 1 tablet (1 mg total) by mouth 2 (two) times daily.    Dispense:  180 tablet    Refill:  3      BP Readings from Last 3 Encounters:  12/18/22 110/60  09/18/22 (!) 92/52  09/05/22 (!) 104/52   Wt Readings from Last 3 Encounters:  12/18/22 176 lb 6.4 oz (80 kg)  12/02/22 178 lb (80.7 kg)  09/18/22 178 lb (80.7 kg)    Lab Results  Component Value Date   CHOL 144 09/05/2022   CHOL 137 01/01/2022   CHOL 185 06/14/2021   Lab Results  Component Value Date   HDL 60.80 09/05/2022   HDL 55 01/01/2022   HDL 63 06/14/2021   Lab Results  Component Value Date   LDLCALC 53 09/05/2022   LDLCALC 61 01/01/2022  LDLCALC 102 (H) 06/14/2021   Lab Results  Component Value Date   TRIG 151.0 (H) 09/05/2022   TRIG 119 01/01/2022   TRIG 112 06/14/2021   Lab Results  Component Value Date   CHOLHDL 2 09/05/2022   CHOLHDL 2.5 01/01/2022   CHOLHDL 2.9 06/14/2021   Lab Results  Component Value Date   LDLDIRECT 94.0 05/01/2018   Lab Results  Component Value Date   CREATININE 0.56 11/08/2022   BUN 14 11/08/2022   NA 139 11/08/2022   K 3.4 (L) 11/08/2022   CL 100 11/08/2022   CO2 30 11/08/2022    The 10-year ASCVD risk score (Arnett DK, et al., 2019) is: 15.6%   Values used to calculate the score:     Age: 36 years     Sex: Female     Is Non-Hispanic African American: No     Diabetic: Yes     Tobacco smoker: No     Systolic Blood Pressure: [id number] mmHg      Is BP treated: Yes     HDL Cholesterol: 60.8 mg/dL     Total Cholesterol: 144 mg/dL  I reviewed the patients updated PMH, FH, and SocHx.    Patient Active Problem List   Diagnosis Date Noted   Obesity, Class I, BMI 30-34.9 10/07/2019    Priority: High   Insomnia due to medical condition 10/18/2018    Priority: High   Prediabetes 10/12/2018    Priority: High   Mixed hyperlipidemia 03/01/2018    Priority: High   Acquired hypothyroidism 03/01/2018    Priority: High   Frequent PVCs 03/01/2018    Priority: High   Adjustment disorder with anxiety 10/07/2019    Priority: Medium    GERD (gastroesophageal reflux disease) 03/30/2018    Priority: Medium    Meniere disease 03/01/2018    Priority: Medium    Notalgia paresthetica 07/01/2019    Priority: Low   Diverticulosis 03/30/2018    Priority: Low   Prolongation of QRS complex on electrocardiography 09/18/2022   Trigger finger, right ring finger 01/29/2022   Trigger finger, right middle finger 01/29/2022   Influenza A (H1N1) 09/03/2021   History of rotator cuff syndrome with tear and adhesive capsulitis 09/11/2018   History of lateral epicondylitis of right elbow 06/25/2018    Allergies: Seasonal ic [cholestatin] and Adhesive [tape]  Social History: Patient  reports that she has never smoked. She has never used smokeless tobacco. She reports current alcohol use. She reports that she does not use drugs.  Current Meds  Medication Sig   Calcium Citrate-Vitamin D 315-250 MG-UNIT TABS Take by mouth.   diclofenac Sodium (VOLTAREN) 1 % GEL Apply 2 g topically 4 (four) times daily.   EVENING PRIMROSE OIL PO Take 1,500 mg by mouth 2 (two) times daily.   flecainide (TAMBOCOR) 50 MG tablet TAKE 1 AND 1/2 TABLET BY MOUTH TWO TIMES A DAY   fluticasone (FLONASE) 50 MCG/ACT nasal spray SPRAY ONE SPRAY IN EACH NOSTRIL ONCE DAILY   Glucosamine-Chondroit-Vit C-Mn (GLUCOSAMINE 1500 COMPLEX) CAPS Take by mouth.   hydrochlorothiazide (MICROZIDE)  12.5 MG capsule Take 1 capsule (12.5 mg total) by mouth daily. (Patient taking differently: Take 12.5 mg by mouth every other day.)   levothyroxine (SYNTHROID) 100 MCG tablet TAKE ONE TABLET BY MOUTH DAILY   MAGNESIUM PO Take by mouth. Zinc and vit d   meclizine (ANTIVERT) 25 MG tablet Take 25 mg by mouth 3 (three) times daily as needed for dizziness.   meloxicam (  MOBIC) 7.5 MG tablet Take 7.5 mg by mouth as needed.   montelukast (SINGULAIR) 10 MG tablet Take 1 tablet (10 mg total) by mouth at bedtime.   naltrexone (DEPADE) 50 MG tablet Take 50 mg by mouth in the morning and at bedtime.   omega-3 acid ethyl esters (LOVAZA) 1 g capsule Take 1 g by mouth 2 (two) times daily.   ondansetron (ZOFRAN-ODT) 4 MG disintegrating tablet Take 4 mg by mouth every 8 (eight) hours as needed for nausea or vomiting.   Potassium Chloride ER 20 MEQ TBCR Take 1 tablet by mouth daily.   Probiotic Product (META BIOTIC/BIO-ACTIVE 12 PO) Take by mouth.   Probiotic Product (PROBIOTIC-10 PO) Take 1 capsule by mouth daily.   rosuvastatin (CRESTOR) 10 MG tablet TAKE 1 TABLET BY MOUTH DAILY   Semaglutide, 2 MG/DOSE, (OZEMPIC, 2 MG/DOSE,) 8 MG/3ML SOPN Inject 2 mg into the skin once a week. (Patient taking differently: Inject 2 mg into the skin once a week. 1.5)   sertraline (ZOLOFT) 50 MG tablet Take 2 tablets (100 mg total) by mouth daily.   TART CHERRY PO Take 1 tablet by mouth daily.   Turmeric 400 MG CAPS Take 3 capsules by mouth daily.   Vitamin D-Vitamin K (D3 + K2 DOTS PO) Take by mouth.   [DISCONTINUED] glycopyrrolate (ROBINUL) 1 MG tablet TAKE 1 TABLET BY MOUTH TWICE A DAY    Review of Systems: Cardiovascular: negative for chest pain, palpitations, leg swelling, orthopnea Respiratory: negative for SOB, wheezing or persistent cough Gastrointestinal: negative for abdominal pain Genitourinary: negative for dysuria or gross hematuria  Objective  Vitals: BP 110/60   Pulse 75   Temp 97.7 F (36.5 C)   Ht '5\' 4"'$   (1.626 m)   Wt 176 lb 6.4 oz (80 kg)   SpO2 94%   BMI 30.28 kg/m  General: no acute distress  Psych:  Alert and oriented, normal mood and affect HEENT:  Normocephalic, atraumatic, supple neck  Cardiovascular:  RRR without murmur. no edema Neurologic:   Mental status is normal Commons side effects, risks, benefits, and alternatives for medications and treatment plan prescribed today were discussed, and the patient expressed understanding of the given instructions. Patient is instructed to call or message via MyChart if he/she has any questions or concerns regarding our treatment plan. No barriers to understanding were identified. We discussed Red Flag symptoms and signs in detail. Patient expressed understanding regarding what to do in case of urgent or emergency type symptoms.  Medication list was reconciled, printed and provided to the patient in AVS. Patient instructions and summary information was reviewed with the patient as documented in the AVS. This note was prepared with assistance of Dragon voice recognition software. Occasional wrong-word or sound-a-like substitutions may have occurred due to the inherent limitation

## 2022-12-19 NOTE — Addendum Note (Signed)
Addended by: Billey Chang on: 12/19/2022 12:51 PM   Modules accepted: Orders

## 2022-12-23 DIAGNOSIS — M79671 Pain in right foot: Secondary | ICD-10-CM | POA: Diagnosis not present

## 2022-12-23 DIAGNOSIS — M9906 Segmental and somatic dysfunction of lower extremity: Secondary | ICD-10-CM | POA: Diagnosis not present

## 2022-12-23 DIAGNOSIS — M9901 Segmental and somatic dysfunction of cervical region: Secondary | ICD-10-CM | POA: Diagnosis not present

## 2022-12-23 DIAGNOSIS — M25551 Pain in right hip: Secondary | ICD-10-CM | POA: Diagnosis not present

## 2022-12-23 DIAGNOSIS — M79672 Pain in left foot: Secondary | ICD-10-CM | POA: Diagnosis not present

## 2022-12-23 DIAGNOSIS — H8109 Meniere's disease, unspecified ear: Secondary | ICD-10-CM | POA: Diagnosis not present

## 2022-12-26 ENCOUNTER — Telehealth: Payer: Self-pay | Admitting: Pharmacist

## 2022-12-26 NOTE — Progress Notes (Signed)
Care Management & Coordination Services Pharmacy Team  Reason for Encounter: General adherence update   Contacted patient for general health update and medication adherence call.  Spoke with patient on 12/26/2022    What concerns do you have about your medications? None  The patient denies side effects with their medications.   How often do you forget or accidentally miss a dose? Never  Do you use a pillbox? Yes  Are you having any problems getting your medications from your pharmacy? No  Has the cost of your medications been a concern? No If yes, what medication and is patient assistance available or has it been applied for?  Since last visit with PharmD, no interventions have been made.   The patient has not had an ED visit since last contact.   The patient denies problems with their health.   Patient denies concerns or questions for Leata Mouse, PharmD at this time.   Counseled patient on: Access to carecoordination team for any cost, medication or pharmacy concerns.   Chart Updates:  Recent office visits:  12/18/2022 [facility] (PCP) Leamon Arnt, MD; no medication changes indicated.  09/05/2022 [facility] (PCP) Blythe Stanford, MD; no medication changes indicated.  Recent consult visits:  09/18/2022 [facility] (Cardiology) Deboraha Sprang, MD;  With her low blood pressure, we will discontinue her once a day acebutolol.   Hospital visits:  None in previous 6 months  Medications: Outpatient Encounter Medications as of 12/26/2022  Medication Sig   Calcium Citrate-Vitamin D 315-250 MG-UNIT TABS Take by mouth.   diclofenac Sodium (VOLTAREN) 1 % GEL Apply 2 g topically 4 (four) times daily.   EVENING PRIMROSE OIL PO Take 1,500 mg by mouth 2 (two) times daily.   flecainide (TAMBOCOR) 50 MG tablet TAKE 1 AND 1/2 TABLET BY MOUTH TWO TIMES A DAY   fluticasone (FLONASE) 50 MCG/ACT nasal spray SPRAY ONE SPRAY IN EACH NOSTRIL ONCE DAILY   Glucosamine-Chondroit-Vit C-Mn (GLUCOSAMINE 1500  COMPLEX) CAPS Take by mouth.   glycopyrrolate (ROBINUL) 1 MG tablet Take 1 tablet (1 mg total) by mouth 2 (two) times daily.   hydrochlorothiazide (MICROZIDE) 12.5 MG capsule Take 1 capsule (12.5 mg total) by mouth every other day.   levothyroxine (SYNTHROID) 100 MCG tablet TAKE ONE TABLET BY MOUTH DAILY   MAGNESIUM PO Take by mouth. Zinc and vit d   meclizine (ANTIVERT) 25 MG tablet Take 25 mg by mouth 3 (three) times daily as needed for dizziness.   meloxicam (MOBIC) 7.5 MG tablet Take 7.5 mg by mouth as needed.   montelukast (SINGULAIR) 10 MG tablet Take 1 tablet (10 mg total) by mouth at bedtime.   naltrexone (DEPADE) 50 MG tablet Take 50 mg by mouth in the morning and at bedtime.   omega-3 acid ethyl esters (LOVAZA) 1 g capsule Take 1 g by mouth 2 (two) times daily.   ondansetron (ZOFRAN-ODT) 4 MG disintegrating tablet Take 4 mg by mouth every 8 (eight) hours as needed for nausea or vomiting.   Probiotic Product (META BIOTIC/BIO-ACTIVE 12 PO) Take by mouth.   Probiotic Product (PROBIOTIC-10 PO) Take 1 capsule by mouth daily.   rosuvastatin (CRESTOR) 10 MG tablet TAKE 1 TABLET BY MOUTH DAILY   Semaglutide, 2 MG/DOSE, (OZEMPIC, 2 MG/DOSE,) 8 MG/3ML SOPN Inject 2 mg into the skin once a week. (Patient taking differently: Inject 2 mg into the skin once a week. 1.5)   sertraline (ZOLOFT) 50 MG tablet Take 2 tablets (100 mg total) by mouth daily.  TART CHERRY PO Take 1 tablet by mouth daily.   Turmeric 400 MG CAPS Take 3 capsules by mouth daily.   Vitamin D-Vitamin K (D3 + K2 DOTS PO) Take by mouth.   No facility-administered encounter medications on file as of 12/26/2022.    Recent vitals BP Readings from Last 3 Encounters:  12/18/22 110/60  09/18/22 (!) 92/52  09/05/22 (!) 104/52   Pulse Readings from Last 3 Encounters:  12/18/22 75  09/18/22 70  09/05/22 75   Wt Readings from Last 3 Encounters:  12/18/22 176 lb 6.4 oz (80 kg)  12/02/22 178 lb (80.7 kg)  09/18/22 178 lb (80.7  kg)   BMI Readings from Last 3 Encounters:  12/18/22 30.28 kg/m  12/02/22 30.55 kg/m  09/18/22 30.55 kg/m    Recent lab results    Component Value Date/Time   NA 139 12/18/2022 1029   NA 141 01/01/2022 0831   K 3.6 12/18/2022 1029   CL 103 12/18/2022 1029   CO2 29 12/18/2022 1029   GLUCOSE 93 12/18/2022 1029   BUN 17 12/18/2022 1029   BUN 17 01/01/2022 0831   CREATININE 0.50 12/18/2022 1029   CREATININE 0.58 06/21/2020 1012   CALCIUM 8.7 12/18/2022 1029    Lab Results  Component Value Date   CREATININE 0.50 12/18/2022   GFR 94.98 12/18/2022   EGFR 96 01/01/2022   GFRNONAA >60 02/18/2022   GFRAA 111 10/18/2020   Lab Results  Component Value Date/Time   HGBA1C 5.9 04/30/2022 12:00 AM   HGBA1C 5.8 (H) 10/23/2021 12:09 PM   HGBA1C 5.9 (H) 06/14/2021 10:37 AM   HGBA1C 5.7 11/27/2017 12:00 AM   FRUCTOSAMINE 220 01/01/2022 08:31 AM   MICROALBUR <0.7 05/01/2018 09:55 AM    Lab Results  Component Value Date   CHOL 144 09/05/2022   HDL 60.80 09/05/2022   LDLCALC 53 09/05/2022   LDLDIRECT 94.0 05/01/2018   TRIG 151.0 (H) 09/05/2022   CHOLHDL 2 09/05/2022    Care Gaps: Annual wellness visit in last year? Yes   Star Rating Drugs:  Rosuvastatin 10 mg last filed 12/09/2022 90 DS Ozempic last filled 11/11/2022 84 DS   Future Appointments  Date Time Provider Junction  01/30/2023 10:30 AM [facility]  03/17/2023  9:45 AM Deboraha Sprang, MD [facility]  07/28/2023  1:00 PM Leamon Arnt, MD [facility]  12/15/2023  1:00 PM [facility] ANNUAL WELLNESS VISIT 1 [facility]   April D Calhoun, [place] Pharmacist Assistant [phone]

## 2023-01-27 DIAGNOSIS — M9902 Segmental and somatic dysfunction of thoracic region: Secondary | ICD-10-CM | POA: Diagnosis not present

## 2023-01-27 DIAGNOSIS — E782 Mixed hyperlipidemia: Secondary | ICD-10-CM | POA: Diagnosis not present

## 2023-01-27 DIAGNOSIS — M25512 Pain in left shoulder: Secondary | ICD-10-CM | POA: Diagnosis not present

## 2023-01-27 DIAGNOSIS — M9903 Segmental and somatic dysfunction of lumbar region: Secondary | ICD-10-CM | POA: Diagnosis not present

## 2023-01-27 DIAGNOSIS — M9906 Segmental and somatic dysfunction of lower extremity: Secondary | ICD-10-CM | POA: Diagnosis not present

## 2023-01-27 DIAGNOSIS — M9901 Segmental and somatic dysfunction of cervical region: Secondary | ICD-10-CM | POA: Diagnosis not present

## 2023-01-27 DIAGNOSIS — M9908 Segmental and somatic dysfunction of rib cage: Secondary | ICD-10-CM | POA: Diagnosis not present

## 2023-01-27 DIAGNOSIS — M25551 Pain in right hip: Secondary | ICD-10-CM | POA: Diagnosis not present

## 2023-01-27 DIAGNOSIS — Z6828 Body mass index (BMI) 28.0-28.9, adult: Secondary | ICD-10-CM | POA: Diagnosis not present

## 2023-01-27 DIAGNOSIS — F439 Reaction to severe stress, unspecified: Secondary | ICD-10-CM | POA: Diagnosis not present

## 2023-01-27 DIAGNOSIS — G4486 Cervicogenic headache: Secondary | ICD-10-CM | POA: Diagnosis not present

## 2023-01-27 DIAGNOSIS — E663 Overweight: Secondary | ICD-10-CM | POA: Diagnosis not present

## 2023-01-27 DIAGNOSIS — Z8639 Personal history of other endocrine, nutritional and metabolic disease: Secondary | ICD-10-CM | POA: Diagnosis not present

## 2023-01-27 DIAGNOSIS — M25511 Pain in right shoulder: Secondary | ICD-10-CM | POA: Diagnosis not present

## 2023-01-27 DIAGNOSIS — E890 Postprocedural hypothyroidism: Secondary | ICD-10-CM | POA: Diagnosis not present

## 2023-01-28 DIAGNOSIS — H8103 Meniere's disease, bilateral: Secondary | ICD-10-CM | POA: Diagnosis not present

## 2023-01-30 ENCOUNTER — Other Ambulatory Visit (INDEPENDENT_AMBULATORY_CARE_PROVIDER_SITE_OTHER): Payer: PPO

## 2023-01-30 DIAGNOSIS — E876 Hypokalemia: Secondary | ICD-10-CM | POA: Diagnosis not present

## 2023-01-30 LAB — BASIC METABOLIC PANEL
BUN: 15 mg/dL (ref 6–23)
CO2: 26 mEq/L (ref 19–32)
Calcium: 8.5 mg/dL (ref 8.4–10.5)
Chloride: 105 mEq/L (ref 96–112)
Creatinine, Ser: 0.58 mg/dL (ref 0.40–1.20)
GFR: 91.57 mL/min (ref >60.00)
Glucose, Bld: 123 mg/dL — ABNORMAL HIGH (ref 70–99)
Potassium: 3.7 mEq/L (ref 3.5–5.1)
Sodium: 139 mEq/L (ref 135–145)

## 2023-02-12 DIAGNOSIS — M9907 Segmental and somatic dysfunction of upper extremity: Secondary | ICD-10-CM | POA: Diagnosis not present

## 2023-02-12 DIAGNOSIS — M9902 Segmental and somatic dysfunction of thoracic region: Secondary | ICD-10-CM | POA: Diagnosis not present

## 2023-02-12 DIAGNOSIS — M9906 Segmental and somatic dysfunction of lower extremity: Secondary | ICD-10-CM | POA: Diagnosis not present

## 2023-02-12 DIAGNOSIS — M542 Cervicalgia: Secondary | ICD-10-CM | POA: Diagnosis not present

## 2023-02-12 DIAGNOSIS — M9908 Segmental and somatic dysfunction of rib cage: Secondary | ICD-10-CM | POA: Diagnosis not present

## 2023-02-12 DIAGNOSIS — M9903 Segmental and somatic dysfunction of lumbar region: Secondary | ICD-10-CM | POA: Diagnosis not present

## 2023-02-12 DIAGNOSIS — M79672 Pain in left foot: Secondary | ICD-10-CM | POA: Diagnosis not present

## 2023-02-12 DIAGNOSIS — M25551 Pain in right hip: Secondary | ICD-10-CM | POA: Diagnosis not present

## 2023-02-12 DIAGNOSIS — G44209 Tension-type headache, unspecified, not intractable: Secondary | ICD-10-CM | POA: Diagnosis not present

## 2023-02-17 DIAGNOSIS — Z6828 Body mass index (BMI) 28.0-28.9, adult: Secondary | ICD-10-CM | POA: Diagnosis not present

## 2023-02-17 DIAGNOSIS — E663 Overweight: Secondary | ICD-10-CM | POA: Diagnosis not present

## 2023-02-17 DIAGNOSIS — R0989 Other specified symptoms and signs involving the circulatory and respiratory systems: Secondary | ICD-10-CM | POA: Diagnosis not present

## 2023-02-17 DIAGNOSIS — R632 Polyphagia: Secondary | ICD-10-CM | POA: Diagnosis not present

## 2023-02-17 DIAGNOSIS — G8929 Other chronic pain: Secondary | ICD-10-CM | POA: Diagnosis not present

## 2023-02-17 DIAGNOSIS — Z8639 Personal history of other endocrine, nutritional and metabolic disease: Secondary | ICD-10-CM | POA: Diagnosis not present

## 2023-02-28 DIAGNOSIS — L918 Other hypertrophic disorders of the skin: Secondary | ICD-10-CM | POA: Diagnosis not present

## 2023-02-28 DIAGNOSIS — M47812 Spondylosis without myelopathy or radiculopathy, cervical region: Secondary | ICD-10-CM | POA: Diagnosis not present

## 2023-02-28 DIAGNOSIS — L820 Inflamed seborrheic keratosis: Secondary | ICD-10-CM | POA: Diagnosis not present

## 2023-02-28 DIAGNOSIS — R208 Other disturbances of skin sensation: Secondary | ICD-10-CM | POA: Diagnosis not present

## 2023-02-28 DIAGNOSIS — M1990 Unspecified osteoarthritis, unspecified site: Secondary | ICD-10-CM | POA: Diagnosis not present

## 2023-02-28 DIAGNOSIS — D1801 Hemangioma of skin and subcutaneous tissue: Secondary | ICD-10-CM | POA: Diagnosis not present

## 2023-02-28 DIAGNOSIS — L821 Other seborrheic keratosis: Secondary | ICD-10-CM | POA: Diagnosis not present

## 2023-03-04 DIAGNOSIS — M9905 Segmental and somatic dysfunction of pelvic region: Secondary | ICD-10-CM | POA: Diagnosis not present

## 2023-03-04 DIAGNOSIS — M9906 Segmental and somatic dysfunction of lower extremity: Secondary | ICD-10-CM | POA: Diagnosis not present

## 2023-03-04 DIAGNOSIS — L299 Pruritus, unspecified: Secondary | ICD-10-CM | POA: Diagnosis not present

## 2023-03-04 DIAGNOSIS — M9901 Segmental and somatic dysfunction of cervical region: Secondary | ICD-10-CM | POA: Diagnosis not present

## 2023-03-04 DIAGNOSIS — M25512 Pain in left shoulder: Secondary | ICD-10-CM | POA: Diagnosis not present

## 2023-03-04 DIAGNOSIS — M542 Cervicalgia: Secondary | ICD-10-CM | POA: Diagnosis not present

## 2023-03-04 DIAGNOSIS — M9903 Segmental and somatic dysfunction of lumbar region: Secondary | ICD-10-CM | POA: Diagnosis not present

## 2023-03-04 DIAGNOSIS — R202 Paresthesia of skin: Secondary | ICD-10-CM | POA: Diagnosis not present

## 2023-03-04 DIAGNOSIS — M25511 Pain in right shoulder: Secondary | ICD-10-CM | POA: Diagnosis not present

## 2023-03-04 DIAGNOSIS — M9902 Segmental and somatic dysfunction of thoracic region: Secondary | ICD-10-CM | POA: Diagnosis not present

## 2023-03-11 DIAGNOSIS — E663 Overweight: Secondary | ICD-10-CM | POA: Diagnosis not present

## 2023-03-11 DIAGNOSIS — R632 Polyphagia: Secondary | ICD-10-CM | POA: Diagnosis not present

## 2023-03-11 DIAGNOSIS — Z8639 Personal history of other endocrine, nutritional and metabolic disease: Secondary | ICD-10-CM | POA: Diagnosis not present

## 2023-03-11 DIAGNOSIS — F439 Reaction to severe stress, unspecified: Secondary | ICD-10-CM | POA: Diagnosis not present

## 2023-03-11 DIAGNOSIS — Z6828 Body mass index (BMI) 28.0-28.9, adult: Secondary | ICD-10-CM | POA: Diagnosis not present

## 2023-03-11 DIAGNOSIS — E890 Postprocedural hypothyroidism: Secondary | ICD-10-CM | POA: Diagnosis not present

## 2023-03-13 DIAGNOSIS — R9431 Abnormal electrocardiogram [ECG] [EKG]: Secondary | ICD-10-CM | POA: Insufficient documentation

## 2023-03-17 ENCOUNTER — Ambulatory Visit: Payer: PPO | Attending: Internal Medicine | Admitting: Internal Medicine

## 2023-03-17 ENCOUNTER — Encounter: Payer: Self-pay | Admitting: Internal Medicine

## 2023-03-17 VITALS — BP 104/62 | HR 74 | Ht 64.0 in | Wt 176.6 lb

## 2023-03-17 DIAGNOSIS — I493 Ventricular premature depolarization: Secondary | ICD-10-CM

## 2023-03-17 DIAGNOSIS — R9431 Abnormal electrocardiogram [ECG] [EKG]: Secondary | ICD-10-CM | POA: Diagnosis not present

## 2023-03-17 MED ORDER — FLECAINIDE ACETATE 50 MG PO TABS: 50.0000 mg | ORAL_TABLET | Freq: Two times a day (BID) | ORAL | 3 refills | Status: DC

## 2023-03-17 NOTE — Patient Instructions (Signed)
Medication Instructions:  Your physician has recommended you make the following change in your medication:   ** Decrease Flecainide to 50mg   - 1 tablet by mouth twice daily.  *If you need a refill on your cardiac medications before your next appointment, please call your pharmacy*   Lab Work: None ordered.  If you have labs (blood work) drawn today and your tests are completely normal, you will receive your results only by: MyChart Message (if you have MyChart) OR A paper copy in the mail If you have any lab test that is abnormal or we need to change your treatment, we will call you to review the results.   Testing/Procedures: None ordered.    Follow-Up: At [facility], you and your health needs are our priority.  As part of our continuing mission to provide you with exceptional heart care, we have created designated Provider Care Teams.  These Care Teams include your primary Cardiologist (physician) and Advanced Practice Providers (APPs -  Physician Assistants and Nurse Practitioners) who all work together to provide you with the care you need, when you need it.  We recommend signing up for the patient portal called "MyChart".  Sign up information is provided on this After Visit Summary.  MyChart is used to connect with patients for Virtual Visits (Telemedicine).  Patients are able to view lab/test results, encounter notes, upcoming appointments, etc.  Non-urgent messages can be sent to your provider as well.   To learn more about what you can do with MyChart, go to [email or web address].    Your next appointment:   12 months with Dr Graciela Husbands  Other Instructions EKG as scheduled for nurse visit

## 2023-03-17 NOTE — Progress Notes (Signed)
ELECTROPHYSIOLOGY Office NOTE  Patient ID: Mary Hernandez, MRN: [id number], DOB/AGE: 1950/12/03 72 y.o. Admit date: (Not on file) Date of Consult: 03/17/2023  Primary Physician: Willow Ora, MD         HPI Mary Hernandez  (long a, hard g)is a 72 y.o. female seen in follow-up for symptomatic PACs/PVCs treated with flecainide initiated in [facility] seen here since 2019  The patient denies chest pain, shortness of breath, nocturnal dyspnea, orthopnea or peripheral edema.  There have been no palpitations, lightheadedness or syncope.Marland Kitchen   Has had low blood pressure.  Taking HCTZ for Meniere's on HCTZ    Date Cr K Hgb  3/23   13.9  4/24 0.58 3.7          DATE TEST EF    2/16 LHC  60-65 % Coronary Art normal  6/17 Echo   75 %    10/19  Calcium Score    0  7/20 Echo  60-65%     DATE PR interval QRSduration PQRS Dose-Flec  4/14 170 90 260 0  7/18  224 112  100  9/19 218 130 348 100  11/20 206 132  75  12/21 198 118  75  12/23 212 122 334 75  6/24 206 126 332 75        Past Medical History:  Diagnosis Date   Acquired hypothyroidism 03/01/2018   hx of goiter; s/p thyroidectomy   Anxiety    Atrial premature depolarization    BPPV (benign paroxysmal positional vertigo) 03/01/2018   Colon polyps    Complication of anesthesia    takes longer to wake up   Essential hypertension 03/01/2018   denies hx - on diuretic for Meniere's and beta blocker for PVCs   Frequent PVCs 03/01/2018   Controlled on Flecainide, Acebutolol   Gallbladder problem    GERD (gastroesophageal reflux disease)    Hip pain    History of cardiac catheterization    Nuc 7/16:  normal perfusion; EF 76 // LHC 8/16:  normal coronary arteries   History of depression    History of dizziness    History of echocardiogram    Echo 03/2006:  Normal LVEF   History of stomach ulcers    HLD (hyperlipidemia)    Hypothyroidism    IBS (irritable bowel syndrome)    Infertility, female    Insulin  resistance    Lower back pain    Meniere's disease    Notalgia paresthetica    PONV (postoperative nausea and vomiting)    Prediabetes    PVC (premature ventricular contraction)    Vertigo    Vitamin D deficiency       Surgical History:  Past Surgical History:  Procedure Laterality Date   BLADDER SURGERY  1997   BREAST BIOPSY  2013   BREAST REDUCTION SURGERY  1999   CARDIAC CATHETERIZATION  05/2015   Carpel Tunnel Release     CHOLECYSTECTOMY  2011   COLONOSCOPY  08/20/2015   ESOPHAGOGASTRODUODENOSCOPY  01/13/2017   FOOT NEUROMA SURGERY  1986   REDUCTION MAMMAPLASTY     REFRACTIVE SURGERY     S/P Eye Right 1967   Muscle Clipped    THYROIDECTOMY  2012   TONSILLECTOMY  1975   TOTAL ABDOMINAL HYSTERECTOMY  1996   TRIGGER FINGER RELEASE Right 02/20/2022   Procedure: RIGHT MIDDLE AND RING FINGER RELEASE TRIGGER FINGER/A-1 PULLEY;  Surgeon: Marlyne Beards, MD;  Location: [facility];  Service: Orthopedics;  Laterality: Right;    Current Outpatient Medications on File Prior to Visit  Medication Sig Dispense Refill   Calcium Citrate-Vitamin D 315-250 MG-UNIT TABS Take by mouth.     diclofenac Sodium (VOLTAREN) 1 % GEL Apply 2 g topically 4 (four) times daily. 350 g 5   EVENING PRIMROSE OIL PO Take 1,500 mg by mouth 2 (two) times daily.     flecainide (TAMBOCOR) 50 MG tablet TAKE 1 AND 1/2 TABLET BY MOUTH TWO TIMES A DAY 270 tablet 3   fluticasone (FLONASE) 50 MCG/ACT nasal spray SPRAY ONE SPRAY IN EACH NOSTRIL ONCE DAILY 16 mL 2   Glucosamine-Chondroit-Vit C-Mn (GLUCOSAMINE 1500 COMPLEX) CAPS Take by mouth.     glycopyrrolate (ROBINUL) 1 MG tablet Take 1 tablet (1 mg total) by mouth 2 (two) times daily. 180 tablet 3   hydrochlorothiazide (MICROZIDE) 12.5 MG capsule Take 1 capsule (12.5 mg total) by mouth every other day.     levothyroxine (SYNTHROID) 100 MCG tablet TAKE ONE TABLET BY MOUTH DAILY 90 tablet 3   MAGNESIUM PO Take by mouth. Zinc and vit d     meclizine  (ANTIVERT) 25 MG tablet Take 25 mg by mouth 3 (three) times daily as needed for dizziness.     meloxicam (MOBIC) 7.5 MG tablet Take 7.5 mg by mouth as needed.     montelukast (SINGULAIR) 10 MG tablet Take 1 tablet (10 mg total) by mouth at bedtime. 90 tablet 3   naltrexone (DEPADE) 50 MG tablet Take 50 mg by mouth in the morning and at bedtime.     omega-3 acid ethyl esters (LOVAZA) 1 g capsule Take 1 g by mouth 2 (two) times daily.     omeprazole (PRILOSEC) 40 MG capsule Take 40 mg by mouth 2 (two) times daily.     ondansetron (ZOFRAN-ODT) 4 MG disintegrating tablet Take 4 mg by mouth every 8 (eight) hours as needed for nausea or vomiting.     Probiotic Product (PROBIOTIC-10 PO) Take 1 capsule by mouth daily.     rosuvastatin (CRESTOR) 10 MG tablet TAKE 1 TABLET BY MOUTH DAILY 90 tablet 0   Semaglutide, 2 MG/DOSE, (OZEMPIC, 2 MG/DOSE,) 8 MG/3ML SOPN Inject 2 mg into the skin once a week. (Patient taking differently: Inject 2 mg into the skin once a week. 1.5) 9 mL 1   sertraline (ZOLOFT) 50 MG tablet Take 2 tablets (100 mg total) by mouth daily. 180 tablet 3   Turmeric 400 MG CAPS Take 3 capsules by mouth daily.     Vitamin D-Vitamin K (D3 + K2 DOTS PO) Take by mouth.     No current facility-administered medications on file prior to visit.    Allergies:  Allergies  Allergen Reactions   Seasonal Ic [Cholestatin]    Adhesive [Tape] Rash      ROS:  Please see the history of present illness.     All other systems reviewed and negative.   Physical Examination  BP 104/62   Pulse 74   Ht 5\' 4"  (1.626 m)   Wt 176 lb 9.6 oz (80.1 kg)   SpO2 96%   BMI 30.31 kg/m  Well developed and nourished in no acute distress HENT normal Neck supple with JVP-  flat  Clear Regular rate and rhythm, no murmurs or gallops Abd-soft with active BS No Clubbing cyanosis edema Skin-warm and dry A & Oriented  Grossly normal sensory and motor function  ECG sinus  74  21/13/42  Cardiac Enzymes No  results for input(s): "CKTOTAL", "CKMB", "TROPONINI" in the last 72 hours. CBC Lab Results  Component Value Date   WBC 6.1 01/01/2022   HGB 13.9 01/01/2022   HCT 42.7 01/01/2022   MCV 91 01/01/2022   PLT 200 01/01/2022   PROTIME: No results for input(s): "LABPROT", "INR" in the last 72 hours. Chemistry No results for input(s): "NA", "K", "CL", "CO2", "BUN", "CREATININE", "CALCIUM", "PROT", "BILITOT", "ALKPHOS", "ALT", "AST", "GLUCOSE" in the last 168 hours.  Invalid input(s): "LABALBU" Lipids Lab Results  Component Value Date   CHOL 144 09/05/2022   HDL 60.80 09/05/2022   LDLCALC 53 09/05/2022   TRIG 151.0 (H) 09/05/2022    EKG: sinus 71 22/13/44   Assessment and Plan:  PVCs  Hypokalemia  Abnormal ECG    PVCs remain well-controlled.  Will reduce the flecainide from 75--50 based on the P-QRS widening.        Sherryl Manges

## 2023-03-18 DIAGNOSIS — M79672 Pain in left foot: Secondary | ICD-10-CM | POA: Diagnosis not present

## 2023-03-18 DIAGNOSIS — M9902 Segmental and somatic dysfunction of thoracic region: Secondary | ICD-10-CM | POA: Diagnosis not present

## 2023-03-18 DIAGNOSIS — M9901 Segmental and somatic dysfunction of cervical region: Secondary | ICD-10-CM | POA: Diagnosis not present

## 2023-03-18 DIAGNOSIS — M25551 Pain in right hip: Secondary | ICD-10-CM | POA: Diagnosis not present

## 2023-03-18 DIAGNOSIS — M9906 Segmental and somatic dysfunction of lower extremity: Secondary | ICD-10-CM | POA: Diagnosis not present

## 2023-03-18 DIAGNOSIS — M9908 Segmental and somatic dysfunction of rib cage: Secondary | ICD-10-CM | POA: Diagnosis not present

## 2023-03-18 DIAGNOSIS — M9903 Segmental and somatic dysfunction of lumbar region: Secondary | ICD-10-CM | POA: Diagnosis not present

## 2023-03-18 DIAGNOSIS — M542 Cervicalgia: Secondary | ICD-10-CM | POA: Diagnosis not present

## 2023-03-18 DIAGNOSIS — M9905 Segmental and somatic dysfunction of pelvic region: Secondary | ICD-10-CM | POA: Diagnosis not present

## 2023-03-18 DIAGNOSIS — M9900 Segmental and somatic dysfunction of head region: Secondary | ICD-10-CM | POA: Diagnosis not present

## 2023-03-18 DIAGNOSIS — M9904 Segmental and somatic dysfunction of sacral region: Secondary | ICD-10-CM | POA: Diagnosis not present

## 2023-03-18 DIAGNOSIS — M545 Low back pain, unspecified: Secondary | ICD-10-CM | POA: Diagnosis not present

## 2023-03-23 ENCOUNTER — Encounter: Payer: Self-pay | Admitting: Internal Medicine

## 2023-03-25 ENCOUNTER — Telehealth: Payer: Self-pay | Admitting: Internal Medicine

## 2023-03-25 NOTE — Telephone Encounter (Signed)
Pt c/o medication issue:  1. Name of Medication:   flecainide (TAMBOCOR) 50 MG tablet    2. How are you currently taking this medication (dosage and times per day)?  Take 1 tablet (50 mg total) by mouth 2 (two) times daily.       3. Are you having a reaction (difficulty breathing--STAT)? No  4. What is your medication issue? Pt stated she'd like a callback from the nurse to discuss her concerns with the medication since taking it and then adding an additional medication that MD didn't prescribe. Please advise

## 2023-03-25 NOTE — Telephone Encounter (Signed)
Message was sent via MyChart 03/23/23  Since I saw you on Monday, June 10th, I cut back 1/2 tablet of feccanide and added 10 mg. amitriptyline. I'm just feeling a little different heart beat.  It's not really a flutter, but just a stronger beat that makes me very aware.  The feeling started a little on Thursday and Friday.  Then increased on Saturday and Sunday.  I come back in for my scheduled EKG on June 28 and then leave town for [place].  Just wanted to touch base with you and see if I should change anything. Thanks, Mary Hernandez    Spoke with the patient, she was seen in the office on 6/10 was advised:   ** Decrease Flecainide to 50mg   - 1 tablet by mouth twice daily. Pt stated she feels " kind of nervous, like heavy heart beat", the symptom may last 30 second and it's random. She denies any other symptoms.  Pt stated  yesterday she took her  bp it was 119/61, she said this is elevated because she has low blood pressure. Pt will like advise from Dr. Graciela Husbands, if she should continue with the medication or does he recommend another medication.  The office can contact her by phone and leave a message or by MyChart. Explained ED precautions, pt voiced understanding.

## 2023-03-26 DIAGNOSIS — M542 Cervicalgia: Secondary | ICD-10-CM | POA: Diagnosis not present

## 2023-03-26 DIAGNOSIS — L299 Pruritus, unspecified: Secondary | ICD-10-CM | POA: Diagnosis not present

## 2023-03-26 DIAGNOSIS — M25541 Pain in joints of right hand: Secondary | ICD-10-CM | POA: Diagnosis not present

## 2023-03-26 DIAGNOSIS — M9905 Segmental and somatic dysfunction of pelvic region: Secondary | ICD-10-CM | POA: Diagnosis not present

## 2023-03-26 DIAGNOSIS — M25511 Pain in right shoulder: Secondary | ICD-10-CM | POA: Diagnosis not present

## 2023-03-26 DIAGNOSIS — M25551 Pain in right hip: Secondary | ICD-10-CM | POA: Diagnosis not present

## 2023-03-26 DIAGNOSIS — M9902 Segmental and somatic dysfunction of thoracic region: Secondary | ICD-10-CM | POA: Diagnosis not present

## 2023-03-26 DIAGNOSIS — M9901 Segmental and somatic dysfunction of cervical region: Secondary | ICD-10-CM | POA: Diagnosis not present

## 2023-03-26 DIAGNOSIS — M9904 Segmental and somatic dysfunction of sacral region: Secondary | ICD-10-CM | POA: Diagnosis not present

## 2023-03-26 NOTE — Telephone Encounter (Signed)
Lm to call back ./cy 

## 2023-03-26 NOTE — Telephone Encounter (Signed)
Pt stated she was calling to follow up on receiving a callback to see what she should be advised to do from MD. She said if she doesn't answer, you may leave a detailed message. Please advise

## 2023-03-27 NOTE — Telephone Encounter (Signed)
Patient is returning call. Requesting return call.  

## 2023-03-31 ENCOUNTER — Other Ambulatory Visit: Payer: Self-pay | Admitting: Family Medicine

## 2023-03-31 NOTE — Telephone Encounter (Signed)
Called and got no answer 6/23 and 6/24  left VM

## 2023-04-01 DIAGNOSIS — R0989 Other specified symptoms and signs involving the circulatory and respiratory systems: Secondary | ICD-10-CM | POA: Diagnosis not present

## 2023-04-01 DIAGNOSIS — M25551 Pain in right hip: Secondary | ICD-10-CM | POA: Diagnosis not present

## 2023-04-01 DIAGNOSIS — M9901 Segmental and somatic dysfunction of cervical region: Secondary | ICD-10-CM | POA: Diagnosis not present

## 2023-04-01 DIAGNOSIS — M9902 Segmental and somatic dysfunction of thoracic region: Secondary | ICD-10-CM | POA: Diagnosis not present

## 2023-04-01 DIAGNOSIS — M9906 Segmental and somatic dysfunction of lower extremity: Secondary | ICD-10-CM | POA: Diagnosis not present

## 2023-04-01 DIAGNOSIS — M9905 Segmental and somatic dysfunction of pelvic region: Secondary | ICD-10-CM | POA: Diagnosis not present

## 2023-04-01 DIAGNOSIS — M79641 Pain in right hand: Secondary | ICD-10-CM | POA: Diagnosis not present

## 2023-04-01 DIAGNOSIS — G4486 Cervicogenic headache: Secondary | ICD-10-CM | POA: Diagnosis not present

## 2023-04-01 DIAGNOSIS — M9904 Segmental and somatic dysfunction of sacral region: Secondary | ICD-10-CM | POA: Diagnosis not present

## 2023-04-04 ENCOUNTER — Ambulatory Visit: Payer: PPO | Attending: Cardiovascular Disease

## 2023-04-04 VITALS — HR 72 | Ht 64.0 in | Wt 176.0 lb

## 2023-04-04 DIAGNOSIS — I493 Ventricular premature depolarization: Secondary | ICD-10-CM

## 2023-04-04 DIAGNOSIS — Z79899 Other long term (current) drug therapy: Secondary | ICD-10-CM

## 2023-04-04 NOTE — Telephone Encounter (Signed)
Pt seen today for ECG nurse visit.  She states she did speak with Dr Graciela Husbands last weekend and was advised to continue Flecainide as prescribed.  Pt states she is doing well but still has occasional episodes where she feels her heart beating "stronger"  This only last for a few seconds.  ECG was completed today and reviewed by Dr Nelly Laurence, DOD who advised pt to continue current dose of Flecainide.  Pt verbalizes understanding and agrees with current plan.  See Nurse visit note for complete details

## 2023-04-04 NOTE — Progress Notes (Unsigned)
   Nurse Visit   Date of Encounter: 04/04/2023 ID: Rochella Scarfo, DOB Oct 01, 1951, MRN [id number]  PCP:  Willow Ora, MD   [facility] Providers Cardiologist:  None Electrophysiologist:  Sherryl Manges, MD { Click to update primary MD,subspecialty MD or APP then REFRESH:1}     Visit Details   VS:  Pulse 72   Ht 5\' 4"  (1.626 m)   Wt 176 lb (79.8 kg)   BMI 30.21 kg/m  , BMI Body mass index is 30.21 kg/m.  Wt Readings from Last 3 Encounters:  04/04/23 176 lb (79.8 kg)  03/17/23 176 lb 9.6 oz (80.1 kg)  12/18/22 176 lb 6.4 oz (80 kg)     Reason for visit: ECG for Flecainide reducation Performed today: Vital signs, ECG, consulted with Dr Nelly Laurence, DOD Changes (medications, testing, etc.) : none Length of Visit: 30 minutes    Medications Adjustments/Labs and Tests Ordered: Orders Placed This Encounter  Procedures   EKG 12-Lead   EKG 12-Lead   No orders of the defined types were placed in this encounter. Pt presents for ECG at the request of Dr Graciela Husbands due to pt's Flecainide decrease from 75mg  bid to 50mg  bid.  Pt states she is doing well with medication decrease but will have random episodes of feeling her heart beating stronger.  This only lasts for a few seconds.  Pt reports she has spoken with Dr Graciela Husbands about this and he recommended she continue on the current dose of Flecainide.  Pt advised to contact office if she continues to have these episodes.  Pt verbalizes understanding and agrees with current plan.  Signed, Alois Cliche, RN  04/04/2023 11:50 AM

## 2023-04-04 NOTE — Patient Instructions (Signed)
Medication Instructions:  Your physician recommends that you continue on your current medications as directed. Please refer to the Current Medication list given to you today.  *If you need a refill on your cardiac medications before your next appointment, please call your pharmacy*   Lab Work: None ordered.  If you have labs (blood work) drawn today and your tests are completely normal, you will receive your results only by: MyChart Message (if you have MyChart) OR A paper copy in the mail If you have any lab test that is abnormal or we need to change your treatment, we will call you to review the results.   Testing/Procedures: None ordered.    Follow-Up: At [facility], you and your health needs are our priority.  As part of our continuing mission to provide you with exceptional heart care, we have created designated Provider Care Teams.  These Care Teams include your primary Cardiologist (physician) and Advanced Practice Providers (APPs -  Physician Assistants and Nurse Practitioners) who all work together to provide you with the care you need, when you need it.  We recommend signing up for the patient portal called "MyChart".  Sign up information is provided on this After Visit Summary.  MyChart is used to connect with patients for Virtual Visits (Telemedicine).  Patients are able to view lab/test results, encounter notes, upcoming appointments, etc.  Non-urgent messages can be sent to your provider as well.   To learn more about what you can do with MyChart, go to [email or web address].    Your next appointment:   As planned 

## 2023-04-16 ENCOUNTER — Encounter: Payer: Self-pay | Admitting: Internal Medicine

## 2023-04-16 NOTE — Progress Notes (Signed)
QRSd and PR interval significantly shorter on the lower dose of flecainide

## 2023-05-21 DIAGNOSIS — M25531 Pain in right wrist: Secondary | ICD-10-CM | POA: Diagnosis not present

## 2023-05-21 DIAGNOSIS — M25541 Pain in joints of right hand: Secondary | ICD-10-CM | POA: Diagnosis not present

## 2023-05-21 DIAGNOSIS — M9907 Segmental and somatic dysfunction of upper extremity: Secondary | ICD-10-CM | POA: Diagnosis not present

## 2023-05-21 DIAGNOSIS — M9905 Segmental and somatic dysfunction of pelvic region: Secondary | ICD-10-CM | POA: Diagnosis not present

## 2023-05-21 DIAGNOSIS — M9903 Segmental and somatic dysfunction of lumbar region: Secondary | ICD-10-CM | POA: Diagnosis not present

## 2023-05-21 DIAGNOSIS — M9904 Segmental and somatic dysfunction of sacral region: Secondary | ICD-10-CM | POA: Diagnosis not present

## 2023-05-22 DIAGNOSIS — F439 Reaction to severe stress, unspecified: Secondary | ICD-10-CM | POA: Diagnosis not present

## 2023-05-22 DIAGNOSIS — E890 Postprocedural hypothyroidism: Secondary | ICD-10-CM | POA: Diagnosis not present

## 2023-05-22 DIAGNOSIS — Z8639 Personal history of other endocrine, nutritional and metabolic disease: Secondary | ICD-10-CM | POA: Diagnosis not present

## 2023-05-22 DIAGNOSIS — R632 Polyphagia: Secondary | ICD-10-CM | POA: Diagnosis not present

## 2023-05-22 DIAGNOSIS — E663 Overweight: Secondary | ICD-10-CM | POA: Diagnosis not present

## 2023-05-22 DIAGNOSIS — Z6828 Body mass index (BMI) 28.0-28.9, adult: Secondary | ICD-10-CM | POA: Diagnosis not present

## 2023-05-23 ENCOUNTER — Other Ambulatory Visit: Payer: Self-pay | Admitting: Family Medicine

## 2023-05-23 DIAGNOSIS — Z1231 Encounter for screening mammogram for malignant neoplasm of breast: Secondary | ICD-10-CM

## 2023-06-11 DIAGNOSIS — M9901 Segmental and somatic dysfunction of cervical region: Secondary | ICD-10-CM | POA: Diagnosis not present

## 2023-06-11 DIAGNOSIS — M9905 Segmental and somatic dysfunction of pelvic region: Secondary | ICD-10-CM | POA: Diagnosis not present

## 2023-06-11 DIAGNOSIS — M9907 Segmental and somatic dysfunction of upper extremity: Secondary | ICD-10-CM | POA: Diagnosis not present

## 2023-06-11 DIAGNOSIS — M25511 Pain in right shoulder: Secondary | ICD-10-CM | POA: Diagnosis not present

## 2023-06-11 DIAGNOSIS — M25551 Pain in right hip: Secondary | ICD-10-CM | POA: Diagnosis not present

## 2023-06-11 DIAGNOSIS — M9902 Segmental and somatic dysfunction of thoracic region: Secondary | ICD-10-CM | POA: Diagnosis not present

## 2023-06-11 DIAGNOSIS — M654 Radial styloid tenosynovitis [de Quervain]: Secondary | ICD-10-CM | POA: Diagnosis not present

## 2023-06-11 DIAGNOSIS — M79641 Pain in right hand: Secondary | ICD-10-CM | POA: Diagnosis not present

## 2023-06-18 ENCOUNTER — Encounter: Payer: Self-pay | Admitting: Family Medicine

## 2023-06-19 ENCOUNTER — Other Ambulatory Visit: Payer: Self-pay | Admitting: Family Medicine

## 2023-06-20 DIAGNOSIS — B351 Tinea unguium: Secondary | ICD-10-CM | POA: Diagnosis not present

## 2023-06-20 DIAGNOSIS — R208 Other disturbances of skin sensation: Secondary | ICD-10-CM | POA: Diagnosis not present

## 2023-06-25 DIAGNOSIS — M5414 Radiculopathy, thoracic region: Secondary | ICD-10-CM | POA: Diagnosis not present

## 2023-06-25 DIAGNOSIS — R202 Paresthesia of skin: Secondary | ICD-10-CM | POA: Diagnosis not present

## 2023-06-25 DIAGNOSIS — M25531 Pain in right wrist: Secondary | ICD-10-CM | POA: Diagnosis not present

## 2023-06-25 DIAGNOSIS — M654 Radial styloid tenosynovitis [de Quervain]: Secondary | ICD-10-CM | POA: Diagnosis not present

## 2023-06-26 ENCOUNTER — Encounter: Payer: Self-pay | Admitting: Family Medicine

## 2023-06-26 ENCOUNTER — Encounter: Payer: Self-pay | Admitting: Gastroenterology

## 2023-06-26 ENCOUNTER — Other Ambulatory Visit: Payer: Self-pay | Admitting: Sports Medicine

## 2023-06-26 DIAGNOSIS — M5414 Radiculopathy, thoracic region: Secondary | ICD-10-CM

## 2023-07-01 DIAGNOSIS — M25531 Pain in right wrist: Secondary | ICD-10-CM | POA: Diagnosis not present

## 2023-07-01 DIAGNOSIS — R531 Weakness: Secondary | ICD-10-CM | POA: Diagnosis not present

## 2023-07-01 DIAGNOSIS — M25631 Stiffness of right wrist, not elsewhere classified: Secondary | ICD-10-CM | POA: Diagnosis not present

## 2023-07-01 DIAGNOSIS — M2560 Stiffness of unspecified joint, not elsewhere classified: Secondary | ICD-10-CM | POA: Diagnosis not present

## 2023-07-02 ENCOUNTER — Other Ambulatory Visit: Payer: Self-pay | Admitting: Family Medicine

## 2023-07-08 DIAGNOSIS — M2560 Stiffness of unspecified joint, not elsewhere classified: Secondary | ICD-10-CM | POA: Diagnosis not present

## 2023-07-08 DIAGNOSIS — M25631 Stiffness of right wrist, not elsewhere classified: Secondary | ICD-10-CM | POA: Diagnosis not present

## 2023-07-08 DIAGNOSIS — M25531 Pain in right wrist: Secondary | ICD-10-CM | POA: Diagnosis not present

## 2023-07-08 DIAGNOSIS — E782 Mixed hyperlipidemia: Secondary | ICD-10-CM | POA: Diagnosis not present

## 2023-07-08 DIAGNOSIS — R531 Weakness: Secondary | ICD-10-CM | POA: Diagnosis not present

## 2023-07-08 DIAGNOSIS — E890 Postprocedural hypothyroidism: Secondary | ICD-10-CM | POA: Diagnosis not present

## 2023-07-08 DIAGNOSIS — E663 Overweight: Secondary | ICD-10-CM | POA: Diagnosis not present

## 2023-07-08 DIAGNOSIS — Z8639 Personal history of other endocrine, nutritional and metabolic disease: Secondary | ICD-10-CM | POA: Diagnosis not present

## 2023-07-08 DIAGNOSIS — Z6828 Body mass index (BMI) 28.0-28.9, adult: Secondary | ICD-10-CM | POA: Diagnosis not present

## 2023-07-08 DIAGNOSIS — F439 Reaction to severe stress, unspecified: Secondary | ICD-10-CM | POA: Diagnosis not present

## 2023-07-10 DIAGNOSIS — M2560 Stiffness of unspecified joint, not elsewhere classified: Secondary | ICD-10-CM | POA: Diagnosis not present

## 2023-07-10 DIAGNOSIS — M25631 Stiffness of right wrist, not elsewhere classified: Secondary | ICD-10-CM | POA: Diagnosis not present

## 2023-07-10 DIAGNOSIS — R531 Weakness: Secondary | ICD-10-CM | POA: Diagnosis not present

## 2023-07-10 DIAGNOSIS — M25531 Pain in right wrist: Secondary | ICD-10-CM | POA: Diagnosis not present

## 2023-07-11 ENCOUNTER — Encounter: Payer: Self-pay | Admitting: Pharmacist

## 2023-07-11 NOTE — Progress Notes (Signed)
Pharmacy Quality Measure Review  This patient is appearing on a report for being at risk of failing the adherence measure for cholesterol (statin) and diabetes medications this calendar year.   Medication: rosuvastatin  Last fill date: 04/05/2023 for 90 day supply  Medication: semaglutide 2mg  per dose Last fill date: 06/05/2023 for 28 day supply  Reviewed Dr Tiajuana Amass database but only showed 2 meds on her list and neither were that ones above.   Called [facility] pharmacy and verified that patient filled rosuvastatin for 90 days on 07/07/2023 and semaglutide for 84 days supply on 07/10/2023. Karin Golden reports patient has picked up both medications.   Insurance report was not up to date. No action needed at this time.   Henrene Pastor, PharmD Clinical Pharmacist [facility]  Population Health [phone]

## 2023-07-14 ENCOUNTER — Ambulatory Visit
Admission: RE | Admit: 2023-07-14 | Discharge: 2023-07-14 | Disposition: A | Discharge status: Home / Self Care | Payer: PPO | Source: Ambulatory Visit | Attending: Family Medicine | Admitting: Family Medicine

## 2023-07-14 ENCOUNTER — Ambulatory Visit: Payer: PPO

## 2023-07-14 DIAGNOSIS — Z1231 Encounter for screening mammogram for malignant neoplasm of breast: Secondary | ICD-10-CM | POA: Diagnosis not present

## 2023-07-15 DIAGNOSIS — M9900 Segmental and somatic dysfunction of head region: Secondary | ICD-10-CM | POA: Diagnosis not present

## 2023-07-15 DIAGNOSIS — M542 Cervicalgia: Secondary | ICD-10-CM | POA: Diagnosis not present

## 2023-07-15 DIAGNOSIS — M9907 Segmental and somatic dysfunction of upper extremity: Secondary | ICD-10-CM | POA: Diagnosis not present

## 2023-07-15 DIAGNOSIS — M9901 Segmental and somatic dysfunction of cervical region: Secondary | ICD-10-CM | POA: Diagnosis not present

## 2023-07-15 DIAGNOSIS — M9908 Segmental and somatic dysfunction of rib cage: Secondary | ICD-10-CM | POA: Diagnosis not present

## 2023-07-15 DIAGNOSIS — M79641 Pain in right hand: Secondary | ICD-10-CM | POA: Diagnosis not present

## 2023-07-15 DIAGNOSIS — M654 Radial styloid tenosynovitis [de Quervain]: Secondary | ICD-10-CM | POA: Diagnosis not present

## 2023-07-15 DIAGNOSIS — M25512 Pain in left shoulder: Secondary | ICD-10-CM | POA: Diagnosis not present

## 2023-07-15 DIAGNOSIS — M25511 Pain in right shoulder: Secondary | ICD-10-CM | POA: Diagnosis not present

## 2023-07-15 DIAGNOSIS — M9902 Segmental and somatic dysfunction of thoracic region: Secondary | ICD-10-CM | POA: Diagnosis not present

## 2023-07-18 ENCOUNTER — Ambulatory Visit
Admission: RE | Admit: 2023-07-18 | Discharge: 2023-07-18 | Disposition: A | Discharge status: Home / Self Care | Payer: PPO | Source: Ambulatory Visit | Attending: Sports Medicine | Admitting: Sports Medicine

## 2023-07-18 DIAGNOSIS — M2560 Stiffness of unspecified joint, not elsewhere classified: Secondary | ICD-10-CM | POA: Diagnosis not present

## 2023-07-18 DIAGNOSIS — M5124 Other intervertebral disc displacement, thoracic region: Secondary | ICD-10-CM | POA: Diagnosis not present

## 2023-07-18 DIAGNOSIS — M5414 Radiculopathy, thoracic region: Secondary | ICD-10-CM

## 2023-07-18 DIAGNOSIS — R531 Weakness: Secondary | ICD-10-CM | POA: Diagnosis not present

## 2023-07-18 DIAGNOSIS — M25631 Stiffness of right wrist, not elsewhere classified: Secondary | ICD-10-CM | POA: Diagnosis not present

## 2023-07-18 DIAGNOSIS — M546 Pain in thoracic spine: Secondary | ICD-10-CM | POA: Diagnosis not present

## 2023-07-18 DIAGNOSIS — M25531 Pain in right wrist: Secondary | ICD-10-CM | POA: Diagnosis not present

## 2023-07-28 ENCOUNTER — Encounter: Payer: PPO | Admitting: Family Medicine

## 2023-07-29 DIAGNOSIS — Z0000 Encounter for general adult medical examination without abnormal findings: Secondary | ICD-10-CM | POA: Diagnosis not present

## 2023-07-29 DIAGNOSIS — M25631 Stiffness of right wrist, not elsewhere classified: Secondary | ICD-10-CM | POA: Diagnosis not present

## 2023-07-29 DIAGNOSIS — Z1159 Encounter for screening for other viral diseases: Secondary | ICD-10-CM | POA: Diagnosis not present

## 2023-07-29 DIAGNOSIS — R531 Weakness: Secondary | ICD-10-CM | POA: Diagnosis not present

## 2023-07-29 DIAGNOSIS — E890 Postprocedural hypothyroidism: Secondary | ICD-10-CM | POA: Diagnosis not present

## 2023-07-29 DIAGNOSIS — H6123 Impacted cerumen, bilateral: Secondary | ICD-10-CM | POA: Diagnosis not present

## 2023-07-29 DIAGNOSIS — I493 Ventricular premature depolarization: Secondary | ICD-10-CM | POA: Diagnosis not present

## 2023-07-29 DIAGNOSIS — E2839 Other primary ovarian failure: Secondary | ICD-10-CM | POA: Diagnosis not present

## 2023-07-29 DIAGNOSIS — Z79899 Other long term (current) drug therapy: Secondary | ICD-10-CM | POA: Diagnosis not present

## 2023-07-29 DIAGNOSIS — J302 Other seasonal allergic rhinitis: Secondary | ICD-10-CM | POA: Diagnosis not present

## 2023-07-29 DIAGNOSIS — E7800 Pure hypercholesterolemia, unspecified: Secondary | ICD-10-CM | POA: Diagnosis not present

## 2023-07-29 DIAGNOSIS — M2560 Stiffness of unspecified joint, not elsewhere classified: Secondary | ICD-10-CM | POA: Diagnosis not present

## 2023-07-29 DIAGNOSIS — M25531 Pain in right wrist: Secondary | ICD-10-CM | POA: Diagnosis not present

## 2023-07-29 DIAGNOSIS — E66811 Obesity, class 1: Secondary | ICD-10-CM | POA: Diagnosis not present

## 2023-07-29 DIAGNOSIS — K760 Fatty (change of) liver, not elsewhere classified: Secondary | ICD-10-CM | POA: Diagnosis not present

## 2023-08-01 DIAGNOSIS — M2560 Stiffness of unspecified joint, not elsewhere classified: Secondary | ICD-10-CM | POA: Diagnosis not present

## 2023-08-01 DIAGNOSIS — M25631 Stiffness of right wrist, not elsewhere classified: Secondary | ICD-10-CM | POA: Diagnosis not present

## 2023-08-01 DIAGNOSIS — M25531 Pain in right wrist: Secondary | ICD-10-CM | POA: Diagnosis not present

## 2023-08-01 DIAGNOSIS — R531 Weakness: Secondary | ICD-10-CM | POA: Diagnosis not present

## 2023-08-04 DIAGNOSIS — M542 Cervicalgia: Secondary | ICD-10-CM | POA: Diagnosis not present

## 2023-08-04 DIAGNOSIS — E782 Mixed hyperlipidemia: Secondary | ICD-10-CM | POA: Diagnosis not present

## 2023-08-04 DIAGNOSIS — E890 Postprocedural hypothyroidism: Secondary | ICD-10-CM | POA: Diagnosis not present

## 2023-08-04 DIAGNOSIS — M25551 Pain in right hip: Secondary | ICD-10-CM | POA: Diagnosis not present

## 2023-08-04 DIAGNOSIS — M9907 Segmental and somatic dysfunction of upper extremity: Secondary | ICD-10-CM | POA: Diagnosis not present

## 2023-08-04 DIAGNOSIS — M9904 Segmental and somatic dysfunction of sacral region: Secondary | ICD-10-CM | POA: Diagnosis not present

## 2023-08-04 DIAGNOSIS — M9905 Segmental and somatic dysfunction of pelvic region: Secondary | ICD-10-CM | POA: Diagnosis not present

## 2023-08-04 DIAGNOSIS — M2560 Stiffness of unspecified joint, not elsewhere classified: Secondary | ICD-10-CM | POA: Diagnosis not present

## 2023-08-04 DIAGNOSIS — R531 Weakness: Secondary | ICD-10-CM | POA: Diagnosis not present

## 2023-08-04 DIAGNOSIS — M25631 Stiffness of right wrist, not elsewhere classified: Secondary | ICD-10-CM | POA: Diagnosis not present

## 2023-08-04 DIAGNOSIS — R7303 Prediabetes: Secondary | ICD-10-CM | POA: Diagnosis not present

## 2023-08-04 DIAGNOSIS — Z6829 Body mass index (BMI) 29.0-29.9, adult: Secondary | ICD-10-CM | POA: Diagnosis not present

## 2023-08-04 DIAGNOSIS — M25531 Pain in right wrist: Secondary | ICD-10-CM | POA: Diagnosis not present

## 2023-08-04 DIAGNOSIS — M79641 Pain in right hand: Secondary | ICD-10-CM | POA: Diagnosis not present

## 2023-08-04 DIAGNOSIS — E663 Overweight: Secondary | ICD-10-CM | POA: Diagnosis not present

## 2023-08-04 DIAGNOSIS — M9901 Segmental and somatic dysfunction of cervical region: Secondary | ICD-10-CM | POA: Diagnosis not present

## 2023-08-04 DIAGNOSIS — M9906 Segmental and somatic dysfunction of lower extremity: Secondary | ICD-10-CM | POA: Diagnosis not present

## 2023-08-04 DIAGNOSIS — Z8639 Personal history of other endocrine, nutritional and metabolic disease: Secondary | ICD-10-CM | POA: Diagnosis not present

## 2023-08-04 DIAGNOSIS — M25512 Pain in left shoulder: Secondary | ICD-10-CM | POA: Diagnosis not present

## 2023-08-04 DIAGNOSIS — I10 Essential (primary) hypertension: Secondary | ICD-10-CM | POA: Diagnosis not present

## 2023-08-07 ENCOUNTER — Ambulatory Visit: Payer: PPO

## 2023-08-07 ENCOUNTER — Encounter: Payer: Self-pay | Admitting: Gastroenterology

## 2023-08-07 VITALS — Ht 64.5 in | Wt 175.0 lb

## 2023-08-07 DIAGNOSIS — M2560 Stiffness of unspecified joint, not elsewhere classified: Secondary | ICD-10-CM | POA: Diagnosis not present

## 2023-08-07 DIAGNOSIS — Z860100 Personal history of colon polyps, unspecified: Secondary | ICD-10-CM

## 2023-08-07 DIAGNOSIS — M25531 Pain in right wrist: Secondary | ICD-10-CM | POA: Diagnosis not present

## 2023-08-07 DIAGNOSIS — R531 Weakness: Secondary | ICD-10-CM | POA: Diagnosis not present

## 2023-08-07 DIAGNOSIS — M25631 Stiffness of right wrist, not elsewhere classified: Secondary | ICD-10-CM | POA: Diagnosis not present

## 2023-08-07 MED ORDER — NA SULFATE-K SULFATE-MG SULF 17.5-3.13-1.6 GM/177ML PO SOLN
1.0000 | Freq: Once | ORAL | 0 refills | Status: AC
Start: 2023-08-07 — End: 2023-08-07

## 2023-08-07 NOTE — Progress Notes (Signed)

## 2023-08-08 ENCOUNTER — Telehealth: Payer: Self-pay | Admitting: Gastroenterology

## 2023-08-08 NOTE — Telephone Encounter (Signed)
Prep Instructions

## 2023-08-11 DIAGNOSIS — R531 Weakness: Secondary | ICD-10-CM | POA: Diagnosis not present

## 2023-08-11 DIAGNOSIS — M2560 Stiffness of unspecified joint, not elsewhere classified: Secondary | ICD-10-CM | POA: Diagnosis not present

## 2023-08-11 DIAGNOSIS — M25631 Stiffness of right wrist, not elsewhere classified: Secondary | ICD-10-CM | POA: Diagnosis not present

## 2023-08-11 DIAGNOSIS — M25531 Pain in right wrist: Secondary | ICD-10-CM | POA: Diagnosis not present

## 2023-08-14 DIAGNOSIS — M25631 Stiffness of right wrist, not elsewhere classified: Secondary | ICD-10-CM | POA: Diagnosis not present

## 2023-08-14 DIAGNOSIS — M2560 Stiffness of unspecified joint, not elsewhere classified: Secondary | ICD-10-CM | POA: Diagnosis not present

## 2023-08-14 DIAGNOSIS — R531 Weakness: Secondary | ICD-10-CM | POA: Diagnosis not present

## 2023-08-14 DIAGNOSIS — M25531 Pain in right wrist: Secondary | ICD-10-CM | POA: Diagnosis not present

## 2023-08-15 DIAGNOSIS — H938X3 Other specified disorders of ear, bilateral: Secondary | ICD-10-CM | POA: Diagnosis not present

## 2023-08-18 DIAGNOSIS — M25531 Pain in right wrist: Secondary | ICD-10-CM | POA: Diagnosis not present

## 2023-08-18 DIAGNOSIS — R531 Weakness: Secondary | ICD-10-CM | POA: Diagnosis not present

## 2023-08-18 DIAGNOSIS — M25631 Stiffness of right wrist, not elsewhere classified: Secondary | ICD-10-CM | POA: Diagnosis not present

## 2023-08-18 DIAGNOSIS — M2560 Stiffness of unspecified joint, not elsewhere classified: Secondary | ICD-10-CM | POA: Diagnosis not present

## 2023-08-20 ENCOUNTER — Encounter: Payer: Self-pay | Admitting: Internal Medicine

## 2023-08-20 DIAGNOSIS — E2839 Other primary ovarian failure: Secondary | ICD-10-CM | POA: Diagnosis not present

## 2023-08-20 DIAGNOSIS — N958 Other specified menopausal and perimenopausal disorders: Secondary | ICD-10-CM | POA: Diagnosis not present

## 2023-08-21 DIAGNOSIS — R531 Weakness: Secondary | ICD-10-CM | POA: Diagnosis not present

## 2023-08-21 DIAGNOSIS — M25531 Pain in right wrist: Secondary | ICD-10-CM | POA: Diagnosis not present

## 2023-08-21 DIAGNOSIS — M2560 Stiffness of unspecified joint, not elsewhere classified: Secondary | ICD-10-CM | POA: Diagnosis not present

## 2023-08-21 DIAGNOSIS — R002 Palpitations: Secondary | ICD-10-CM | POA: Diagnosis not present

## 2023-08-21 DIAGNOSIS — F418 Other specified anxiety disorders: Secondary | ICD-10-CM | POA: Diagnosis not present

## 2023-08-21 DIAGNOSIS — R7303 Prediabetes: Secondary | ICD-10-CM | POA: Diagnosis not present

## 2023-08-21 DIAGNOSIS — Z6829 Body mass index (BMI) 29.0-29.9, adult: Secondary | ICD-10-CM | POA: Diagnosis not present

## 2023-08-21 DIAGNOSIS — M25631 Stiffness of right wrist, not elsewhere classified: Secondary | ICD-10-CM | POA: Diagnosis not present

## 2023-08-21 DIAGNOSIS — Z8639 Personal history of other endocrine, nutritional and metabolic disease: Secondary | ICD-10-CM | POA: Diagnosis not present

## 2023-08-21 DIAGNOSIS — E663 Overweight: Secondary | ICD-10-CM | POA: Diagnosis not present

## 2023-08-22 DIAGNOSIS — M9903 Segmental and somatic dysfunction of lumbar region: Secondary | ICD-10-CM | POA: Diagnosis not present

## 2023-08-22 DIAGNOSIS — M542 Cervicalgia: Secondary | ICD-10-CM | POA: Diagnosis not present

## 2023-08-22 DIAGNOSIS — R42 Dizziness and giddiness: Secondary | ICD-10-CM | POA: Diagnosis not present

## 2023-08-22 DIAGNOSIS — M9904 Segmental and somatic dysfunction of sacral region: Secondary | ICD-10-CM | POA: Diagnosis not present

## 2023-08-22 DIAGNOSIS — M9906 Segmental and somatic dysfunction of lower extremity: Secondary | ICD-10-CM | POA: Diagnosis not present

## 2023-08-22 DIAGNOSIS — M25552 Pain in left hip: Secondary | ICD-10-CM | POA: Diagnosis not present

## 2023-08-22 DIAGNOSIS — F419 Anxiety disorder, unspecified: Secondary | ICD-10-CM | POA: Diagnosis not present

## 2023-08-22 DIAGNOSIS — M25572 Pain in left ankle and joints of left foot: Secondary | ICD-10-CM | POA: Diagnosis not present

## 2023-08-22 DIAGNOSIS — R002 Palpitations: Secondary | ICD-10-CM | POA: Diagnosis not present

## 2023-08-22 DIAGNOSIS — M9905 Segmental and somatic dysfunction of pelvic region: Secondary | ICD-10-CM | POA: Diagnosis not present

## 2023-08-24 ENCOUNTER — Encounter: Payer: Self-pay | Admitting: Certified Registered Nurse Anesthetist

## 2023-08-25 DIAGNOSIS — M25531 Pain in right wrist: Secondary | ICD-10-CM | POA: Diagnosis not present

## 2023-08-25 DIAGNOSIS — M2560 Stiffness of unspecified joint, not elsewhere classified: Secondary | ICD-10-CM | POA: Diagnosis not present

## 2023-08-25 DIAGNOSIS — R531 Weakness: Secondary | ICD-10-CM | POA: Diagnosis not present

## 2023-08-25 DIAGNOSIS — M25631 Stiffness of right wrist, not elsewhere classified: Secondary | ICD-10-CM | POA: Diagnosis not present

## 2023-08-27 ENCOUNTER — Telehealth: Payer: Self-pay | Admitting: Gastroenterology

## 2023-08-27 NOTE — Telephone Encounter (Signed)
Inbound call from patient stating that she is scheduled for procedure tomorrow morning with Dr. Myrtie Neither tomorrow at 8:00. Patient stated that she feels bloated and just " blah". Patient has been taking Tylenol  and is wanting to know if she can take her Zorfran. Please advise.

## 2023-08-27 NOTE — Telephone Encounter (Signed)
Left voicemail for patient with instruction to take the Zofran and call us back if she has any problems or questions.

## 2023-08-28 ENCOUNTER — Encounter: Payer: Self-pay | Admitting: Gastroenterology

## 2023-08-28 ENCOUNTER — Ambulatory Visit: Payer: PPO | Admitting: Gastroenterology

## 2023-08-28 ENCOUNTER — Encounter: Payer: Self-pay | Admitting: Internal Medicine

## 2023-08-28 VITALS — BP 113/51 | HR 71 | Temp 97.5°F | Resp 19 | Ht 64.5 in | Wt 175.0 lb

## 2023-08-28 DIAGNOSIS — K633 Ulcer of intestine: Secondary | ICD-10-CM

## 2023-08-28 DIAGNOSIS — Z1211 Encounter for screening for malignant neoplasm of colon: Secondary | ICD-10-CM | POA: Diagnosis not present

## 2023-08-28 DIAGNOSIS — Z860101 Personal history of adenomatous and serrated colon polyps: Secondary | ICD-10-CM

## 2023-08-28 DIAGNOSIS — Z860100 Personal history of colon polyps, unspecified: Secondary | ICD-10-CM

## 2023-08-28 DIAGNOSIS — D122 Benign neoplasm of ascending colon: Secondary | ICD-10-CM | POA: Diagnosis not present

## 2023-08-28 DIAGNOSIS — I493 Ventricular premature depolarization: Secondary | ICD-10-CM | POA: Diagnosis not present

## 2023-08-28 DIAGNOSIS — Q438 Other specified congenital malformations of intestine: Secondary | ICD-10-CM | POA: Diagnosis not present

## 2023-08-28 DIAGNOSIS — R7303 Prediabetes: Secondary | ICD-10-CM | POA: Diagnosis not present

## 2023-08-28 DIAGNOSIS — E039 Hypothyroidism, unspecified: Secondary | ICD-10-CM | POA: Diagnosis not present

## 2023-08-28 DIAGNOSIS — F419 Anxiety disorder, unspecified: Secondary | ICD-10-CM | POA: Diagnosis not present

## 2023-08-28 MED ORDER — SODIUM CHLORIDE 0.9 % IV SOLN: 500.0000 mL | INTRAVENOUS | Status: DC

## 2023-08-28 NOTE — Progress Notes (Signed)
History and Physical:  This patient presents for endoscopic testing for: Encounter Diagnosis  Name Primary?   History of colonic polyps Yes    Surveillance colonoscopy today. No polyps Nov 2019.  TA polyps x 09 Aug 2015 Patient denies chronic abdominal pain, rectal bleeding, constipation or diarrhea.   Patient is otherwise without complaints or active issues today.   Past Medical History: Past Medical History:  Diagnosis Date   Acquired hypothyroidism 03/01/2018   hx of goiter; s/p thyroidectomy   Anxiety    Atrial premature depolarization    BPPV (benign paroxysmal positional vertigo) 03/01/2018   Colon polyps    Complication of anesthesia    takes longer to wake up   Frequent PVCs 03/01/2018   Controlled on Flecainide, Acebutolol   Gallbladder problem    GERD (gastroesophageal reflux disease)    Hip pain    History of cardiac catheterization    Nuc 7/16:  normal perfusion; EF 76 // LHC 8/16:  normal coronary arteries   History of depression    History of dizziness    History of echocardiogram    Echo 03/2006:  Normal LVEF   History of stomach ulcers    HLD (hyperlipidemia)    Hypothyroidism    IBS (irritable bowel syndrome)    Infertility, female    Insulin resistance    Lower back pain    Meniere's disease    Notalgia paresthetica    PONV (postoperative nausea and vomiting)    Prediabetes    PVC (premature ventricular contraction)    Vertigo    Vitamin D deficiency      Past Surgical History: Past Surgical History:  Procedure Laterality Date   BLADDER SURGERY  1997   BREAST BIOPSY  2013   BREAST REDUCTION SURGERY  1999   CARDIAC CATHETERIZATION  05/2015   Carpel Tunnel Release     CHOLECYSTECTOMY  2011   COLONOSCOPY  08/20/2015   ESOPHAGOGASTRODUODENOSCOPY  01/13/2017   FOOT NEUROMA SURGERY  1986   REDUCTION MAMMAPLASTY     REFRACTIVE SURGERY     S/P Eye Right 1967   Muscle Clipped    THYROIDECTOMY  2012   TONSILLECTOMY  1975   TOTAL ABDOMINAL  HYSTERECTOMY  1996   TRIGGER FINGER RELEASE Right 02/20/2022   Procedure: RIGHT MIDDLE AND RING FINGER RELEASE TRIGGER FINGER/A-1 PULLEY;  Surgeon: Marlyne Beards, MD;  Location: [facility];  Service: Orthopedics;  Laterality: Right;    Allergies: Allergies  Allergen Reactions   Adhesive [Tape] Rash    Outpatient Meds: Current Outpatient Medications  Medication Sig Dispense Refill   amitriptyline (ELAVIL) 10 MG tablet Take 10 mg by mouth at bedtime.     diclofenac Sodium (VOLTAREN) 1 % GEL Apply 2 g topically 4 (four) times daily. (Patient taking differently: Apply 2 g topically as needed.) 350 g 5   EVENING PRIMROSE OIL PO Take 1,500 mg by mouth 2 (two) times daily.     flecainide (TAMBOCOR) 50 MG tablet Take 1 tablet (50 mg total) by mouth 2 (two) times daily. 180 tablet 3   fluticasone (FLONASE) 50 MCG/ACT nasal spray SPRAY ONE SPRAY IN EACH NOSTRIL ONCE DAILY 16 mL 2   Glucosamine-Chondroit-Vit C-Mn (GLUCOSAMINE 1500 COMPLEX) CAPS Take by mouth.     glycopyrrolate (ROBINUL) 1 MG tablet Take 1 tablet (1 mg total) by mouth 2 (two) times daily. 180 tablet 3   hydrochlorothiazide (MICROZIDE) 12.5 MG capsule Take 1 capsule (12.5 mg total) by mouth every other day.  levothyroxine (SYNTHROID) 100 MCG tablet TAKE 1 TABLET BY MOUTH DAILY 90 tablet 3   LORazepam (ATIVAN) 0.5 MG tablet Take by mouth.     MAGNESIUM PO Take by mouth. Vit C & D     meloxicam (MOBIC) 7.5 MG tablet Take 7.5 mg by mouth as needed.     montelukast (SINGULAIR) 10 MG tablet Take 1 tablet (10 mg total) by mouth at bedtime. 90 tablet 3   naltrexone (DEPADE) 50 MG tablet Take 50 mg by mouth in the morning and at bedtime.     omega-3 acid ethyl esters (LOVAZA) 1 g capsule Take 1 g by mouth 2 (two) times daily.     omeprazole (PRILOSEC) 40 MG capsule Take 40 mg by mouth 2 (two) times daily.     ondansetron (ZOFRAN-ODT) 4 MG disintegrating tablet Take 4 mg by mouth every 8 (eight) hours as needed for  nausea or vomiting.     Probiotic Product (PROBIOTIC-10 PO) Take 1 capsule by mouth daily.     rosuvastatin (CRESTOR) 10 MG tablet TAKE 1 TABLET BY MOUTH DAILY 90 tablet 0   sertraline (ZOLOFT) 50 MG tablet Take 2 tablets (100 mg total) by mouth daily. 180 tablet 3   Turmeric 400 MG CAPS Take 3 capsules by mouth daily.     Vitamin D-Vitamin K (D3 + K2 DOTS PO) Take by mouth.     Calcium Citrate-Vitamin D 315-250 MG-UNIT TABS Take by mouth. (Patient not taking: Reported on 08/07/2023)     meclizine (ANTIVERT) 25 MG tablet Take 25 mg by mouth 3 (three) times daily as needed for dizziness.     Semaglutide, 2 MG/DOSE, (OZEMPIC, 2 MG/DOSE,) 8 MG/3ML SOPN Inject 2 mg into the skin once a week. 9 mL 1   Current Facility-Administered Medications  Medication Dose Route Frequency Provider Last Rate Last Admin   0.9 %  sodium chloride infusion  500 mL Intravenous Continuous Danis, Starr Lake III, MD          ___________________________________________________________________ Objective   Exam:  BP 129/65   Pulse 71   Temp (!) 97.5 F (36.4 C)   Resp 13   Ht 5' 4.5" (1.638 m)   Wt 175 lb (79.4 kg)   SpO2 100%   BMI 29.57 kg/m   CV: regular , S1/S2 Resp: clear to auscultation bilaterally, normal RR and effort noted GI: soft, no tenderness, with active bowel sounds.   Assessment: Encounter Diagnosis  Name Primary?   History of colonic polyps Yes     Plan: Colonoscopy   The benefits and risks of the planned procedure were described in detail with the patient or (when appropriate) their health care proxy.  Risks were outlined as including, but not limited to, bleeding, infection, perforation, adverse medication reaction leading to cardiac or pulmonary decompensation, pancreatitis (if ERCP).  The limitation of incomplete mucosal visualization was also discussed.  No guarantees or warranties were given.  The patient is appropriate for an endoscopic procedure in the ambulatory  setting.   - Amada Jupiter, MD

## 2023-08-28 NOTE — Progress Notes (Signed)
0800 Patient experiencing nausea and vomiting with all anesthesia plans, requesting meds to help prevent this from happening.  MD updated and Zofran 4 mg IV given, vss

## 2023-08-28 NOTE — Progress Notes (Signed)
Report given to PACU, vss 

## 2023-08-28 NOTE — Progress Notes (Signed)
Called to room to assist during endoscopic procedure.  Patient ID and intended procedure confirmed with present staff. Received instructions for my participation in the procedure from the performing physician.  

## 2023-08-28 NOTE — Patient Instructions (Signed)
-   Resume previous diet. - Continue present medications. - Await pathology results. - Repeat colonoscopy is recommended for surveillance. The colonoscopy date will be determined after pathology results from today's exam become available for review.  -Check with your primary care provider on when you are hemoglobin was last checked in order to rule out anemia that could occur from chronic occult GI blood loss due to the above-noted small ulcer.   YOU HAD AN ENDOSCOPIC PROCEDURE TODAY AT THE [facility]:   Refer to the procedure report that was given to you for any specific questions about what was found during the examination.  If the procedure report does not answer your questions, please call your gastroenterologist to clarify.  If you requested that your care partner not be given the details of your procedure findings, then the procedure report has been included in a sealed envelope for you to review at your convenience later.  YOU SHOULD EXPECT: Some feelings of bloating in the abdomen. Passage of more gas than usual.  Walking can help get rid of the air that was put into your GI tract during the procedure and reduce the bloating. If you had a lower endoscopy (such as a colonoscopy or flexible sigmoidoscopy) you may notice spotting of blood in your stool or on the toilet paper. If you underwent a bowel prep for your procedure, you may not have a normal bowel movement for a few days.  Please Note:  You might notice some irritation and congestion in your nose or some drainage.  This is from the oxygen used during your procedure.  There is no need for concern and it should clear up in a day or so.  SYMPTOMS TO REPORT IMMEDIATELY:  Following lower endoscopy (colonoscopy or flexible sigmoidoscopy):  Excessive amounts of blood in the stool  Significant tenderness or worsening of abdominal pains  Swelling of the abdomen that is new, acute  Fever of 100F or higher  For urgent or emergent  issues, a gastroenterologist can be reached at any hour by calling [phone]. Do not use MyChart messaging for urgent concerns.    DIET:  We do recommend a small meal at first, but then you may proceed to your regular diet.  Drink plenty of fluids but you should avoid alcoholic beverages for 24 hours.  ACTIVITY:  You should plan to take it easy for the rest of today and you should NOT DRIVE or use heavy machinery until tomorrow (because of the sedation medicines used during the test).    FOLLOW UP: Our staff will call the number listed on your records the next business day following your procedure.  We will call around 7:15- 8:00 am to check on you and address any questions or concerns that you may have regarding the information given to you following your procedure. If we do not reach you, we will leave a message.     If any biopsies were taken you will be contacted by phone or by letter within the next 1-3 weeks.  Please call us at [phone] if you have not heard about the biopsies in 3 weeks.    SIGNATURES/CONFIDENTIALITY: You and/or your care partner have signed paperwork which will be entered into your electronic medical record.  These signatures attest to the fact that that the information above on your After Visit Summary has been reviewed and is understood.  Full responsibility of the confidentiality of this discharge information lies with you and/or your care-partner.

## 2023-08-28 NOTE — Op Note (Signed)
[facility] Patient Name: Mary Hernandez Procedure Date: 08/28/2023 7:58 AM MRN: [id number] Endoscopist: Sherilyn Cooter L. Myrtie Neither , MD, [id number] Age: 72 Referring MD:  Date of Birth: 02-15-1951 Gender: Female Account #: [id number] Procedure:                Colonoscopy Indications:              Surveillance: Personal history of adenomatous                            polyps on last colonoscopy 5 years ago Medicines:                Monitored Anesthesia Care Procedure:                Pre-Anesthesia Assessment:                           - Prior to the procedure, a History and Physical                            was performed, and patient medications and                            allergies were reviewed. The patient's tolerance of                            previous anesthesia was also reviewed. The risks                            and benefits of the procedure and the sedation                            options and risks were discussed with the patient.                            All questions were answered, and informed consent                            was obtained. Prior Anticoagulants: The patient has                            taken no anticoagulant or antiplatelet agents. ASA                            Grade Assessment: II - A patient with mild systemic                            disease. After reviewing the risks and benefits,                            the patient was deemed in satisfactory condition to                            undergo the procedure.  After obtaining informed consent, the colonoscope                            was passed under direct vision. Throughout the                            procedure, the patient's blood pressure, pulse, and                            oxygen saturations were monitored continuously. The                            CF [id number] #[id number] was introduced through the anus                            and advanced to the  the cecum, identified by                            appendiceal orifice and ileocecal valve. The                            colonoscopy was somewhat difficult due to a                            redundant colon. Successful completion of the                            procedure was aided by using manual pressure and                            straightening and shortening the scope to obtain                            bowel loop reduction. The patient tolerated the                            procedure well. The quality of the bowel                            preparation was good. The ileocecal valve,                            appendiceal orifice, and rectum were photographed. Scope In: 8:11:29 AM Scope Out: 8:31:17 AM Scope Withdrawal Time: 0 hours 16 minutes 29 seconds  Total Procedure Duration: 0 hours 19 minutes 48 seconds  Findings:                 The perianal and digital rectal examinations were                            normal.                           Repeat examination of right colon under NBI  performed.                           A single (solitary) three mm ulcer was found in the                            proximal ascending colon. No bleeding was present.                           Three sessile polyps were found in the ascending                            colon. The polyps were diminutive in size. These                            polyps were removed with a cold snare. Resection                            and retrieval were complete.                           The exam was otherwise without abnormality on                            direct and retroflexion views. Complications:            No immediate complications. Estimated Blood Loss:     Estimated blood loss was minimal. Impression:               - A single (solitary) ulcer in the proximal                            ascending colon. Benign appearing. Typically the                            result  of aspirin/NSAID use.                           - Three diminutive polyps in the ascending colon,                            removed with a cold snare. Resected and retrieved.                           - The examination was otherwise normal on direct                            and retroflexion views. Recommendation:           - Patient has a contact number available for                            emergencies. The signs and symptoms of potential                            delayed complications  were discussed with the                            patient. Return to normal activities tomorrow.                            Written discharge instructions were provided to the                            patient.                           - Resume previous diet.                           - Continue present medications.                           - Await pathology results.                           - Repeat colonoscopy is recommended for                            surveillance. The colonoscopy date will be                            determined after pathology results from today's                            exam become available for review.                           -Check with your primary care provider on when you                            are hemoglobin was last checked in order to rule                            out anemia that could occur from chronic occult GI                            blood loss due to the above-noted small ulcer. Nishat Livingston L. Myrtie Neither, MD 08/28/2023 8:38:40 AM This report has been signed electronically.

## 2023-08-28 NOTE — Progress Notes (Signed)
Pt's states no medical or surgical changes since previsit or office visit. 

## 2023-08-29 ENCOUNTER — Telehealth: Payer: Self-pay

## 2023-08-29 NOTE — Telephone Encounter (Signed)
  Follow up Call-     08/28/2023    7:27 AM  Call back number  Post procedure Call Back phone  # [phone]  Permission to leave phone message Yes     Patient questions:  Do you have a fever, pain , or abdominal swelling? No. Pain Score  0 *  Have you tolerated food without any problems? Yes.    Have you been able to return to your normal activities? Yes.    Do you have any questions about your discharge instructions: Diet   No. Medications  No. Follow up visit  No.  Do you have questions or concerns about your Care? No.  Actions: * If pain score is 4 or above: No action needed, pain <4.

## 2023-09-01 LAB — SURGICAL PATHOLOGY

## 2023-09-02 ENCOUNTER — Encounter: Payer: Self-pay | Admitting: Gastroenterology

## 2023-09-02 DIAGNOSIS — E663 Overweight: Secondary | ICD-10-CM | POA: Diagnosis not present

## 2023-09-02 DIAGNOSIS — Z8639 Personal history of other endocrine, nutritional and metabolic disease: Secondary | ICD-10-CM | POA: Diagnosis not present

## 2023-09-02 DIAGNOSIS — R002 Palpitations: Secondary | ICD-10-CM | POA: Diagnosis not present

## 2023-09-02 DIAGNOSIS — R7303 Prediabetes: Secondary | ICD-10-CM | POA: Diagnosis not present

## 2023-09-02 DIAGNOSIS — F418 Other specified anxiety disorders: Secondary | ICD-10-CM | POA: Diagnosis not present

## 2023-09-02 DIAGNOSIS — Z6829 Body mass index (BMI) 29.0-29.9, adult: Secondary | ICD-10-CM | POA: Diagnosis not present

## 2023-09-10 DIAGNOSIS — M542 Cervicalgia: Secondary | ICD-10-CM | POA: Diagnosis not present

## 2023-09-10 DIAGNOSIS — M9904 Segmental and somatic dysfunction of sacral region: Secondary | ICD-10-CM | POA: Diagnosis not present

## 2023-09-10 DIAGNOSIS — M25511 Pain in right shoulder: Secondary | ICD-10-CM | POA: Diagnosis not present

## 2023-09-10 DIAGNOSIS — M9905 Segmental and somatic dysfunction of pelvic region: Secondary | ICD-10-CM | POA: Diagnosis not present

## 2023-09-10 DIAGNOSIS — M9906 Segmental and somatic dysfunction of lower extremity: Secondary | ICD-10-CM | POA: Diagnosis not present

## 2023-09-10 DIAGNOSIS — G4486 Cervicogenic headache: Secondary | ICD-10-CM | POA: Diagnosis not present

## 2023-09-10 DIAGNOSIS — M25551 Pain in right hip: Secondary | ICD-10-CM | POA: Diagnosis not present

## 2023-09-10 DIAGNOSIS — M9902 Segmental and somatic dysfunction of thoracic region: Secondary | ICD-10-CM | POA: Diagnosis not present

## 2023-09-10 DIAGNOSIS — M9901 Segmental and somatic dysfunction of cervical region: Secondary | ICD-10-CM | POA: Diagnosis not present

## 2023-09-10 DIAGNOSIS — M25512 Pain in left shoulder: Secondary | ICD-10-CM | POA: Diagnosis not present

## 2023-09-10 DIAGNOSIS — M79671 Pain in right foot: Secondary | ICD-10-CM | POA: Diagnosis not present

## 2023-09-19 DIAGNOSIS — M9904 Segmental and somatic dysfunction of sacral region: Secondary | ICD-10-CM | POA: Diagnosis not present

## 2023-09-19 DIAGNOSIS — M25551 Pain in right hip: Secondary | ICD-10-CM | POA: Diagnosis not present

## 2023-09-19 DIAGNOSIS — M9907 Segmental and somatic dysfunction of upper extremity: Secondary | ICD-10-CM | POA: Diagnosis not present

## 2023-09-19 DIAGNOSIS — M542 Cervicalgia: Secondary | ICD-10-CM | POA: Diagnosis not present

## 2023-09-19 DIAGNOSIS — M9905 Segmental and somatic dysfunction of pelvic region: Secondary | ICD-10-CM | POA: Diagnosis not present

## 2023-09-19 DIAGNOSIS — M9908 Segmental and somatic dysfunction of rib cage: Secondary | ICD-10-CM | POA: Diagnosis not present

## 2023-09-19 DIAGNOSIS — L299 Pruritus, unspecified: Secondary | ICD-10-CM | POA: Diagnosis not present

## 2023-09-19 DIAGNOSIS — M25511 Pain in right shoulder: Secondary | ICD-10-CM | POA: Diagnosis not present

## 2023-09-19 DIAGNOSIS — M9901 Segmental and somatic dysfunction of cervical region: Secondary | ICD-10-CM | POA: Diagnosis not present

## 2023-09-19 DIAGNOSIS — M9906 Segmental and somatic dysfunction of lower extremity: Secondary | ICD-10-CM | POA: Diagnosis not present

## 2023-09-19 DIAGNOSIS — M79672 Pain in left foot: Secondary | ICD-10-CM | POA: Diagnosis not present

## 2023-09-19 DIAGNOSIS — M25512 Pain in left shoulder: Secondary | ICD-10-CM | POA: Diagnosis not present

## 2023-10-07 DIAGNOSIS — E663 Overweight: Secondary | ICD-10-CM | POA: Diagnosis not present

## 2023-10-07 DIAGNOSIS — F418 Other specified anxiety disorders: Secondary | ICD-10-CM | POA: Diagnosis not present

## 2023-10-07 DIAGNOSIS — E782 Mixed hyperlipidemia: Secondary | ICD-10-CM | POA: Diagnosis not present

## 2023-10-07 DIAGNOSIS — Z6829 Body mass index (BMI) 29.0-29.9, adult: Secondary | ICD-10-CM | POA: Diagnosis not present

## 2023-10-07 DIAGNOSIS — R7303 Prediabetes: Secondary | ICD-10-CM | POA: Diagnosis not present

## 2023-10-07 DIAGNOSIS — Z8639 Personal history of other endocrine, nutritional and metabolic disease: Secondary | ICD-10-CM | POA: Diagnosis not present

## 2023-10-11 ENCOUNTER — Other Ambulatory Visit: Payer: Self-pay | Admitting: Family Medicine

## 2023-10-21 DIAGNOSIS — H16122 Filamentary keratitis, left eye: Secondary | ICD-10-CM | POA: Diagnosis not present

## 2023-11-06 DIAGNOSIS — H2513 Age-related nuclear cataract, bilateral: Secondary | ICD-10-CM | POA: Diagnosis not present

## 2023-11-06 DIAGNOSIS — H0288A Meibomian gland dysfunction right eye, upper and lower eyelids: Secondary | ICD-10-CM | POA: Diagnosis not present

## 2023-11-06 DIAGNOSIS — H16122 Filamentary keratitis, left eye: Secondary | ICD-10-CM | POA: Diagnosis not present

## 2023-11-06 DIAGNOSIS — H0288B Meibomian gland dysfunction left eye, upper and lower eyelids: Secondary | ICD-10-CM | POA: Diagnosis not present

## 2023-12-15 ENCOUNTER — Ambulatory Visit: Payer: PPO

## 2024-01-08 DIAGNOSIS — M546 Pain in thoracic spine: Secondary | ICD-10-CM | POA: Diagnosis not present

## 2024-01-08 DIAGNOSIS — M542 Cervicalgia: Secondary | ICD-10-CM | POA: Diagnosis not present

## 2024-01-08 DIAGNOSIS — M9906 Segmental and somatic dysfunction of lower extremity: Secondary | ICD-10-CM | POA: Diagnosis not present

## 2024-01-08 DIAGNOSIS — M25542 Pain in joints of left hand: Secondary | ICD-10-CM | POA: Diagnosis not present

## 2024-01-08 DIAGNOSIS — M25551 Pain in right hip: Secondary | ICD-10-CM | POA: Diagnosis not present

## 2024-01-08 DIAGNOSIS — Z8639 Personal history of other endocrine, nutritional and metabolic disease: Secondary | ICD-10-CM | POA: Diagnosis not present

## 2024-01-08 DIAGNOSIS — M9908 Segmental and somatic dysfunction of rib cage: Secondary | ICD-10-CM | POA: Diagnosis not present

## 2024-01-08 DIAGNOSIS — M9901 Segmental and somatic dysfunction of cervical region: Secondary | ICD-10-CM | POA: Diagnosis not present

## 2024-01-08 DIAGNOSIS — R632 Polyphagia: Secondary | ICD-10-CM | POA: Diagnosis not present

## 2024-01-08 DIAGNOSIS — R7303 Prediabetes: Secondary | ICD-10-CM | POA: Diagnosis not present

## 2024-01-08 DIAGNOSIS — M9902 Segmental and somatic dysfunction of thoracic region: Secondary | ICD-10-CM | POA: Diagnosis not present

## 2024-01-08 DIAGNOSIS — E782 Mixed hyperlipidemia: Secondary | ICD-10-CM | POA: Diagnosis not present

## 2024-01-08 DIAGNOSIS — Z79899 Other long term (current) drug therapy: Secondary | ICD-10-CM | POA: Diagnosis not present

## 2024-01-08 DIAGNOSIS — F439 Reaction to severe stress, unspecified: Secondary | ICD-10-CM | POA: Diagnosis not present

## 2024-01-08 DIAGNOSIS — Z6829 Body mass index (BMI) 29.0-29.9, adult: Secondary | ICD-10-CM | POA: Diagnosis not present

## 2024-01-08 DIAGNOSIS — M9905 Segmental and somatic dysfunction of pelvic region: Secondary | ICD-10-CM | POA: Diagnosis not present

## 2024-01-08 DIAGNOSIS — E663 Overweight: Secondary | ICD-10-CM | POA: Diagnosis not present

## 2024-01-30 DIAGNOSIS — M9901 Segmental and somatic dysfunction of cervical region: Secondary | ICD-10-CM | POA: Diagnosis not present

## 2024-01-30 DIAGNOSIS — M25542 Pain in joints of left hand: Secondary | ICD-10-CM | POA: Diagnosis not present

## 2024-01-30 DIAGNOSIS — J302 Other seasonal allergic rhinitis: Secondary | ICD-10-CM | POA: Diagnosis not present

## 2024-01-30 DIAGNOSIS — M9906 Segmental and somatic dysfunction of lower extremity: Secondary | ICD-10-CM | POA: Diagnosis not present

## 2024-01-30 DIAGNOSIS — M25551 Pain in right hip: Secondary | ICD-10-CM | POA: Diagnosis not present

## 2024-01-30 DIAGNOSIS — M9905 Segmental and somatic dysfunction of pelvic region: Secondary | ICD-10-CM | POA: Diagnosis not present

## 2024-01-30 DIAGNOSIS — M25531 Pain in right wrist: Secondary | ICD-10-CM | POA: Diagnosis not present

## 2024-01-30 DIAGNOSIS — M9908 Segmental and somatic dysfunction of rib cage: Secondary | ICD-10-CM | POA: Diagnosis not present

## 2024-01-30 DIAGNOSIS — M9904 Segmental and somatic dysfunction of sacral region: Secondary | ICD-10-CM | POA: Diagnosis not present

## 2024-02-18 DIAGNOSIS — M9905 Segmental and somatic dysfunction of pelvic region: Secondary | ICD-10-CM | POA: Diagnosis not present

## 2024-02-18 DIAGNOSIS — R519 Headache, unspecified: Secondary | ICD-10-CM | POA: Diagnosis not present

## 2024-02-18 DIAGNOSIS — M9907 Segmental and somatic dysfunction of upper extremity: Secondary | ICD-10-CM | POA: Diagnosis not present

## 2024-02-18 DIAGNOSIS — M9901 Segmental and somatic dysfunction of cervical region: Secondary | ICD-10-CM | POA: Diagnosis not present

## 2024-02-18 DIAGNOSIS — H6690 Otitis media, unspecified, unspecified ear: Secondary | ICD-10-CM | POA: Diagnosis not present

## 2024-02-18 DIAGNOSIS — M9904 Segmental and somatic dysfunction of sacral region: Secondary | ICD-10-CM | POA: Diagnosis not present

## 2024-02-18 DIAGNOSIS — M25512 Pain in left shoulder: Secondary | ICD-10-CM | POA: Diagnosis not present

## 2024-02-18 DIAGNOSIS — M9908 Segmental and somatic dysfunction of rib cage: Secondary | ICD-10-CM | POA: Diagnosis not present

## 2024-02-18 DIAGNOSIS — M25511 Pain in right shoulder: Secondary | ICD-10-CM | POA: Diagnosis not present

## 2024-02-18 DIAGNOSIS — J00 Acute nasopharyngitis [common cold]: Secondary | ICD-10-CM | POA: Diagnosis not present

## 2024-02-18 DIAGNOSIS — M25551 Pain in right hip: Secondary | ICD-10-CM | POA: Diagnosis not present

## 2024-02-18 DIAGNOSIS — M542 Cervicalgia: Secondary | ICD-10-CM | POA: Diagnosis not present

## 2024-02-19 DIAGNOSIS — U071 COVID-19: Secondary | ICD-10-CM | POA: Diagnosis not present

## 2024-03-09 DIAGNOSIS — I2510 Atherosclerotic heart disease of native coronary artery without angina pectoris: Secondary | ICD-10-CM | POA: Diagnosis not present

## 2024-03-09 DIAGNOSIS — I10 Essential (primary) hypertension: Secondary | ICD-10-CM | POA: Diagnosis not present

## 2024-03-25 ENCOUNTER — Other Ambulatory Visit: Payer: Self-pay | Admitting: Internal Medicine

## 2024-03-25 DIAGNOSIS — M9904 Segmental and somatic dysfunction of sacral region: Secondary | ICD-10-CM | POA: Diagnosis not present

## 2024-03-25 DIAGNOSIS — M9902 Segmental and somatic dysfunction of thoracic region: Secondary | ICD-10-CM | POA: Diagnosis not present

## 2024-03-25 DIAGNOSIS — M25511 Pain in right shoulder: Secondary | ICD-10-CM | POA: Diagnosis not present

## 2024-03-25 DIAGNOSIS — R262 Difficulty in walking, not elsewhere classified: Secondary | ICD-10-CM | POA: Diagnosis not present

## 2024-03-25 DIAGNOSIS — M9905 Segmental and somatic dysfunction of pelvic region: Secondary | ICD-10-CM | POA: Diagnosis not present

## 2024-03-25 DIAGNOSIS — M791 Myalgia, unspecified site: Secondary | ICD-10-CM | POA: Diagnosis not present

## 2024-03-25 DIAGNOSIS — M542 Cervicalgia: Secondary | ICD-10-CM | POA: Diagnosis not present

## 2024-03-25 DIAGNOSIS — M9903 Segmental and somatic dysfunction of lumbar region: Secondary | ICD-10-CM | POA: Diagnosis not present

## 2024-03-25 DIAGNOSIS — M545 Low back pain, unspecified: Secondary | ICD-10-CM | POA: Diagnosis not present

## 2024-03-25 DIAGNOSIS — M25551 Pain in right hip: Secondary | ICD-10-CM | POA: Diagnosis not present

## 2024-03-25 DIAGNOSIS — M9901 Segmental and somatic dysfunction of cervical region: Secondary | ICD-10-CM | POA: Diagnosis not present

## 2024-03-25 DIAGNOSIS — M9908 Segmental and somatic dysfunction of rib cage: Secondary | ICD-10-CM | POA: Diagnosis not present

## 2024-03-26 DIAGNOSIS — R7303 Prediabetes: Secondary | ICD-10-CM | POA: Diagnosis not present

## 2024-03-26 DIAGNOSIS — Z6829 Body mass index (BMI) 29.0-29.9, adult: Secondary | ICD-10-CM | POA: Diagnosis not present

## 2024-03-26 DIAGNOSIS — E782 Mixed hyperlipidemia: Secondary | ICD-10-CM | POA: Diagnosis not present

## 2024-03-26 DIAGNOSIS — Z8639 Personal history of other endocrine, nutritional and metabolic disease: Secondary | ICD-10-CM | POA: Diagnosis not present

## 2024-03-26 DIAGNOSIS — I10 Essential (primary) hypertension: Secondary | ICD-10-CM | POA: Diagnosis not present

## 2024-03-26 DIAGNOSIS — E663 Overweight: Secondary | ICD-10-CM | POA: Diagnosis not present

## 2024-03-26 DIAGNOSIS — F439 Reaction to severe stress, unspecified: Secondary | ICD-10-CM | POA: Diagnosis not present

## 2024-03-29 DIAGNOSIS — H0288A Meibomian gland dysfunction right eye, upper and lower eyelids: Secondary | ICD-10-CM | POA: Diagnosis not present

## 2024-03-29 DIAGNOSIS — H2513 Age-related nuclear cataract, bilateral: Secondary | ICD-10-CM | POA: Diagnosis not present

## 2024-03-29 DIAGNOSIS — H0288B Meibomian gland dysfunction left eye, upper and lower eyelids: Secondary | ICD-10-CM | POA: Diagnosis not present

## 2024-03-29 DIAGNOSIS — H16122 Filamentary keratitis, left eye: Secondary | ICD-10-CM | POA: Diagnosis not present

## 2024-04-01 DIAGNOSIS — L239 Allergic contact dermatitis, unspecified cause: Secondary | ICD-10-CM | POA: Diagnosis not present

## 2024-04-01 DIAGNOSIS — R208 Other disturbances of skin sensation: Secondary | ICD-10-CM | POA: Diagnosis not present

## 2024-04-02 ENCOUNTER — Other Ambulatory Visit: Payer: Self-pay | Admitting: Internal Medicine

## 2024-04-05 DIAGNOSIS — I10 Essential (primary) hypertension: Secondary | ICD-10-CM | POA: Diagnosis not present

## 2024-04-05 DIAGNOSIS — I2510 Atherosclerotic heart disease of native coronary artery without angina pectoris: Secondary | ICD-10-CM | POA: Diagnosis not present

## 2024-04-13 DIAGNOSIS — R632 Polyphagia: Secondary | ICD-10-CM | POA: Diagnosis not present

## 2024-04-13 DIAGNOSIS — F439 Reaction to severe stress, unspecified: Secondary | ICD-10-CM | POA: Diagnosis not present

## 2024-04-13 DIAGNOSIS — Z6830 Body mass index (BMI) 30.0-30.9, adult: Secondary | ICD-10-CM | POA: Diagnosis not present

## 2024-04-13 DIAGNOSIS — R7303 Prediabetes: Secondary | ICD-10-CM | POA: Diagnosis not present

## 2024-04-13 DIAGNOSIS — E66811 Obesity, class 1: Secondary | ICD-10-CM | POA: Diagnosis not present

## 2024-04-14 DIAGNOSIS — M9901 Segmental and somatic dysfunction of cervical region: Secondary | ICD-10-CM | POA: Diagnosis not present

## 2024-04-14 DIAGNOSIS — M9902 Segmental and somatic dysfunction of thoracic region: Secondary | ICD-10-CM | POA: Diagnosis not present

## 2024-04-14 DIAGNOSIS — M25531 Pain in right wrist: Secondary | ICD-10-CM | POA: Diagnosis not present

## 2024-04-14 DIAGNOSIS — M542 Cervicalgia: Secondary | ICD-10-CM | POA: Diagnosis not present

## 2024-04-14 DIAGNOSIS — M9903 Segmental and somatic dysfunction of lumbar region: Secondary | ICD-10-CM | POA: Diagnosis not present

## 2024-04-14 DIAGNOSIS — M9906 Segmental and somatic dysfunction of lower extremity: Secondary | ICD-10-CM | POA: Diagnosis not present

## 2024-04-14 DIAGNOSIS — M9904 Segmental and somatic dysfunction of sacral region: Secondary | ICD-10-CM | POA: Diagnosis not present

## 2024-04-14 DIAGNOSIS — M25511 Pain in right shoulder: Secondary | ICD-10-CM | POA: Diagnosis not present

## 2024-04-14 DIAGNOSIS — M9905 Segmental and somatic dysfunction of pelvic region: Secondary | ICD-10-CM | POA: Diagnosis not present

## 2024-04-14 DIAGNOSIS — M5459 Other low back pain: Secondary | ICD-10-CM | POA: Diagnosis not present

## 2024-04-14 DIAGNOSIS — M9907 Segmental and somatic dysfunction of upper extremity: Secondary | ICD-10-CM | POA: Diagnosis not present

## 2024-04-17 ENCOUNTER — Other Ambulatory Visit: Payer: Self-pay | Admitting: Internal Medicine

## 2024-04-21 DIAGNOSIS — M9907 Segmental and somatic dysfunction of upper extremity: Secondary | ICD-10-CM | POA: Diagnosis not present

## 2024-04-21 DIAGNOSIS — M9908 Segmental and somatic dysfunction of rib cage: Secondary | ICD-10-CM | POA: Diagnosis not present

## 2024-04-21 DIAGNOSIS — M25511 Pain in right shoulder: Secondary | ICD-10-CM | POA: Diagnosis not present

## 2024-04-21 DIAGNOSIS — M9906 Segmental and somatic dysfunction of lower extremity: Secondary | ICD-10-CM | POA: Diagnosis not present

## 2024-04-21 DIAGNOSIS — M542 Cervicalgia: Secondary | ICD-10-CM | POA: Diagnosis not present

## 2024-04-21 DIAGNOSIS — M9901 Segmental and somatic dysfunction of cervical region: Secondary | ICD-10-CM | POA: Diagnosis not present

## 2024-04-21 DIAGNOSIS — M9905 Segmental and somatic dysfunction of pelvic region: Secondary | ICD-10-CM | POA: Diagnosis not present

## 2024-04-21 DIAGNOSIS — M25541 Pain in joints of right hand: Secondary | ICD-10-CM | POA: Diagnosis not present

## 2024-04-21 DIAGNOSIS — M25551 Pain in right hip: Secondary | ICD-10-CM | POA: Diagnosis not present

## 2024-05-03 DIAGNOSIS — M25541 Pain in joints of right hand: Secondary | ICD-10-CM | POA: Diagnosis not present

## 2024-05-03 DIAGNOSIS — M9907 Segmental and somatic dysfunction of upper extremity: Secondary | ICD-10-CM | POA: Diagnosis not present

## 2024-05-03 DIAGNOSIS — M9903 Segmental and somatic dysfunction of lumbar region: Secondary | ICD-10-CM | POA: Diagnosis not present

## 2024-05-03 DIAGNOSIS — M6281 Muscle weakness (generalized): Secondary | ICD-10-CM | POA: Diagnosis not present

## 2024-05-03 DIAGNOSIS — M9901 Segmental and somatic dysfunction of cervical region: Secondary | ICD-10-CM | POA: Diagnosis not present

## 2024-05-03 DIAGNOSIS — M5459 Other low back pain: Secondary | ICD-10-CM | POA: Diagnosis not present

## 2024-05-03 DIAGNOSIS — M9904 Segmental and somatic dysfunction of sacral region: Secondary | ICD-10-CM | POA: Diagnosis not present

## 2024-05-03 DIAGNOSIS — M9906 Segmental and somatic dysfunction of lower extremity: Secondary | ICD-10-CM | POA: Diagnosis not present

## 2024-05-03 DIAGNOSIS — M25551 Pain in right hip: Secondary | ICD-10-CM | POA: Diagnosis not present

## 2024-05-03 DIAGNOSIS — M9905 Segmental and somatic dysfunction of pelvic region: Secondary | ICD-10-CM | POA: Diagnosis not present

## 2024-05-06 DIAGNOSIS — E663 Overweight: Secondary | ICD-10-CM | POA: Diagnosis not present

## 2024-05-06 DIAGNOSIS — E7800 Pure hypercholesterolemia, unspecified: Secondary | ICD-10-CM | POA: Diagnosis not present

## 2024-05-06 DIAGNOSIS — F418 Other specified anxiety disorders: Secondary | ICD-10-CM | POA: Diagnosis not present

## 2024-05-06 DIAGNOSIS — E66811 Obesity, class 1: Secondary | ICD-10-CM | POA: Diagnosis not present

## 2024-05-06 DIAGNOSIS — I10 Essential (primary) hypertension: Secondary | ICD-10-CM | POA: Diagnosis not present

## 2024-05-06 DIAGNOSIS — I2510 Atherosclerotic heart disease of native coronary artery without angina pectoris: Secondary | ICD-10-CM | POA: Diagnosis not present

## 2024-05-08 DIAGNOSIS — I10 Essential (primary) hypertension: Secondary | ICD-10-CM | POA: Diagnosis not present

## 2024-05-08 DIAGNOSIS — I2510 Atherosclerotic heart disease of native coronary artery without angina pectoris: Secondary | ICD-10-CM | POA: Diagnosis not present

## 2024-05-10 MED ORDER — FLECAINIDE ACETATE 50 MG PO TABS: 50.0000 mg | ORAL_TABLET | Freq: Two times a day (BID) | ORAL | 0 refills | Status: DC

## 2024-05-11 DIAGNOSIS — M9905 Segmental and somatic dysfunction of pelvic region: Secondary | ICD-10-CM | POA: Diagnosis not present

## 2024-05-11 DIAGNOSIS — M9908 Segmental and somatic dysfunction of rib cage: Secondary | ICD-10-CM | POA: Diagnosis not present

## 2024-05-11 DIAGNOSIS — M9902 Segmental and somatic dysfunction of thoracic region: Secondary | ICD-10-CM | POA: Diagnosis not present

## 2024-05-11 DIAGNOSIS — M9906 Segmental and somatic dysfunction of lower extremity: Secondary | ICD-10-CM | POA: Diagnosis not present

## 2024-05-11 DIAGNOSIS — M9901 Segmental and somatic dysfunction of cervical region: Secondary | ICD-10-CM | POA: Diagnosis not present

## 2024-05-11 DIAGNOSIS — M25561 Pain in right knee: Secondary | ICD-10-CM | POA: Diagnosis not present

## 2024-05-11 DIAGNOSIS — M542 Cervicalgia: Secondary | ICD-10-CM | POA: Diagnosis not present

## 2024-05-11 DIAGNOSIS — M5459 Other low back pain: Secondary | ICD-10-CM | POA: Diagnosis not present

## 2024-05-11 DIAGNOSIS — M791 Myalgia, unspecified site: Secondary | ICD-10-CM | POA: Diagnosis not present

## 2024-05-11 DIAGNOSIS — M546 Pain in thoracic spine: Secondary | ICD-10-CM | POA: Diagnosis not present

## 2024-05-11 DIAGNOSIS — M9904 Segmental and somatic dysfunction of sacral region: Secondary | ICD-10-CM | POA: Diagnosis not present

## 2024-05-30 NOTE — Progress Notes (Unsigned)
  Electrophysiology Office Note:   Date:  05/31/2024  ID:  Josiah Wojtaszek, DOB November 22, 1950, MRN [id number]  Primary Cardiologist: None Primary Heart Failure: None Electrophysiologist: Elspeth Sage, MD      History of Present Illness:   Mary Hernandez is a 73 y.o. female with h/o PVC's on flecainide , HLD, obesity, GERD, hypothyroidism, Meniere's Disease seen today for routine electrophysiology followup.   Since last being seen in our clinic the patient reports doing well overall. She has noted a slight tremor in her hands in the am that goes away as the day progresses.  It is not particularly bothersome.  She does not have any symptoms of PVC's.  She denies chest pain, palpitations, dyspnea, PND, orthopnea, nausea, vomiting, dizziness, syncope, edema, weight gain, or early satiety.   Review of systems complete and found to be negative unless listed in HPI.   EP Information / Studies Reviewed:    EKG is ordered today. Personal review as below.  EKG Interpretation Date/Time:  Monday May 31 2024 11:35:37 EDT Ventricular Rate:  77 PR Interval:  184 QRS Duration:  134 QT Interval:  408 QTC Calculation: 461 R Axis:   -62  Text Interpretation: Normal sinus rhythm Left axis deviation Right bundle branch block Confirmed by Aniceto Jarvis ([id number]) on 05/31/2024 12:05:07 PM    Arrhythmia / AAD / Pertinent EP Studies PVC's  Flecainide  > initiated in [facility] before 2019, dose reduced in 03/2023 to 50 mg BID due to P-QRS widening    Risk Assessment/Calculations:              Physical Exam:   VS:  BP 118/60   Pulse 77   Ht 5' 5 (1.651 m)   Wt 186 lb (84.4 kg)   SpO2 96%   BMI 30.95 kg/m    Wt Readings from Last 3 Encounters:  05/31/24 186 lb (84.4 kg)  08/28/23 175 lb (79.4 kg)  08/07/23 175 lb (79.4 kg)     GEN: Well nourished, well developed in no acute distress NECK: No JVD; No carotid bruits CARDIAC: Regular rate and rhythm, no murmurs, rubs,  gallops RESPIRATORY:  Clear to auscultation without rales, wheezing or rhonchi  ABDOMEN: Soft, non-tender, non-distended EXTREMITIES:  No edema; No deformity   ASSESSMENT AND PLAN:    PVC's  High Risk Medication Monitoring: Flecainide   LVEF 60-65% in 2020 -EKG with NSR, stable intervals on flecainide .  Previously dose reduced by Dr. Sage.  Monitor QRS. -Flecainide  50 mg BID  -continue magnesium  -low subjective burden   Follow up with EP Team in 6 months > transition to new EP - Dr. Kennyth or Dr. Almetta.   Signed, Jarvis Aniceto, NP-C, AGACNP-BC [facility] - Electrophysiology  05/31/2024, 12:38 PM

## 2024-05-31 ENCOUNTER — Encounter: Payer: Self-pay | Admitting: Pulmonary Disease

## 2024-05-31 ENCOUNTER — Ambulatory Visit: Attending: Pulmonary Disease | Admitting: Pulmonary Disease

## 2024-05-31 ENCOUNTER — Telehealth: Payer: Self-pay

## 2024-05-31 VITALS — BP 118/60 | HR 77 | Ht 65.0 in | Wt 186.0 lb

## 2024-05-31 DIAGNOSIS — Z79899 Other long term (current) drug therapy: Secondary | ICD-10-CM

## 2024-05-31 DIAGNOSIS — I493 Ventricular premature depolarization: Secondary | ICD-10-CM

## 2024-05-31 MED ORDER — FLECAINIDE ACETATE 50 MG PO TABS: 50.0000 mg | ORAL_TABLET | Freq: Two times a day (BID) | ORAL | 0 refills | Status: DC

## 2024-05-31 NOTE — Telephone Encounter (Signed)
 Refill of flecainide  sent to Pharmacy of choice per notes from today.

## 2024-05-31 NOTE — Patient Instructions (Signed)
 Medication Instructions:  Your physician recommends that you continue on your current medications as directed. Please refer to the Current Medication list given to you today.  *If you need a refill on your cardiac medications before your next appointment, please call your pharmacy*  Lab Work: None ordered If you have labs (blood work) drawn today and your tests are completely normal, you will receive your results only by: MyChart Message (if you have MyChart) OR A paper copy in the mail If you have any lab test that is abnormal or we need to change your treatment, we will call you to review the results.  Follow-Up: At [facility], you and your health needs are our priority.  As part of our continuing mission to provide you with exceptional heart care, our providers are all part of one team.  This team includes your primary Cardiologist (physician) and Advanced Practice Providers or APPs (Physician Assistants and Nurse Practitioners) who all work together to provide you with the care you need, when you need it.  Your next appointment:   6 month(s)  Provider:   Fonda Kitty, MD or Dr Almetta

## 2024-06-02 DIAGNOSIS — E663 Overweight: Secondary | ICD-10-CM | POA: Diagnosis not present

## 2024-06-02 DIAGNOSIS — M9901 Segmental and somatic dysfunction of cervical region: Secondary | ICD-10-CM | POA: Diagnosis not present

## 2024-06-02 DIAGNOSIS — M25551 Pain in right hip: Secondary | ICD-10-CM | POA: Diagnosis not present

## 2024-06-02 DIAGNOSIS — M9904 Segmental and somatic dysfunction of sacral region: Secondary | ICD-10-CM | POA: Diagnosis not present

## 2024-06-02 DIAGNOSIS — M9906 Segmental and somatic dysfunction of lower extremity: Secondary | ICD-10-CM | POA: Diagnosis not present

## 2024-06-02 DIAGNOSIS — G8929 Other chronic pain: Secondary | ICD-10-CM | POA: Diagnosis not present

## 2024-06-02 DIAGNOSIS — M79641 Pain in right hand: Secondary | ICD-10-CM | POA: Diagnosis not present

## 2024-06-02 DIAGNOSIS — R7303 Prediabetes: Secondary | ICD-10-CM | POA: Diagnosis not present

## 2024-06-02 DIAGNOSIS — M9905 Segmental and somatic dysfunction of pelvic region: Secondary | ICD-10-CM | POA: Diagnosis not present

## 2024-06-02 DIAGNOSIS — M9908 Segmental and somatic dysfunction of rib cage: Secondary | ICD-10-CM | POA: Diagnosis not present

## 2024-06-02 DIAGNOSIS — Z6829 Body mass index (BMI) 29.0-29.9, adult: Secondary | ICD-10-CM | POA: Diagnosis not present

## 2024-06-02 DIAGNOSIS — M25552 Pain in left hip: Secondary | ICD-10-CM | POA: Diagnosis not present

## 2024-06-02 DIAGNOSIS — E782 Mixed hyperlipidemia: Secondary | ICD-10-CM | POA: Diagnosis not present

## 2024-06-02 DIAGNOSIS — Z8639 Personal history of other endocrine, nutritional and metabolic disease: Secondary | ICD-10-CM | POA: Diagnosis not present

## 2024-06-03 MED ORDER — FLECAINIDE ACETATE 50 MG PO TABS: 50.0000 mg | ORAL_TABLET | Freq: Two times a day (BID) | ORAL | 3 refills | Status: AC

## 2024-06-06 DIAGNOSIS — E663 Overweight: Secondary | ICD-10-CM | POA: Diagnosis not present

## 2024-06-06 DIAGNOSIS — F418 Other specified anxiety disorders: Secondary | ICD-10-CM | POA: Diagnosis not present

## 2024-06-06 DIAGNOSIS — E7800 Pure hypercholesterolemia, unspecified: Secondary | ICD-10-CM | POA: Diagnosis not present

## 2024-06-06 DIAGNOSIS — E66811 Obesity, class 1: Secondary | ICD-10-CM | POA: Diagnosis not present

## 2024-06-09 ENCOUNTER — Other Ambulatory Visit: Payer: Self-pay | Admitting: Family Medicine

## 2024-06-09 DIAGNOSIS — Z1231 Encounter for screening mammogram for malignant neoplasm of breast: Secondary | ICD-10-CM

## 2024-06-10 DIAGNOSIS — L820 Inflamed seborrheic keratosis: Secondary | ICD-10-CM | POA: Diagnosis not present

## 2024-06-10 DIAGNOSIS — L821 Other seborrheic keratosis: Secondary | ICD-10-CM | POA: Diagnosis not present

## 2024-06-10 DIAGNOSIS — D485 Neoplasm of uncertain behavior of skin: Secondary | ICD-10-CM | POA: Diagnosis not present

## 2024-06-18 DIAGNOSIS — I2510 Atherosclerotic heart disease of native coronary artery without angina pectoris: Secondary | ICD-10-CM | POA: Diagnosis not present

## 2024-06-18 DIAGNOSIS — I10 Essential (primary) hypertension: Secondary | ICD-10-CM | POA: Diagnosis not present

## 2024-06-25 DIAGNOSIS — M9905 Segmental and somatic dysfunction of pelvic region: Secondary | ICD-10-CM | POA: Diagnosis not present

## 2024-06-25 DIAGNOSIS — R519 Headache, unspecified: Secondary | ICD-10-CM | POA: Diagnosis not present

## 2024-06-25 DIAGNOSIS — M9906 Segmental and somatic dysfunction of lower extremity: Secondary | ICD-10-CM | POA: Diagnosis not present

## 2024-06-25 DIAGNOSIS — M25551 Pain in right hip: Secondary | ICD-10-CM | POA: Diagnosis not present

## 2024-06-25 DIAGNOSIS — M9907 Segmental and somatic dysfunction of upper extremity: Secondary | ICD-10-CM | POA: Diagnosis not present

## 2024-06-25 DIAGNOSIS — M542 Cervicalgia: Secondary | ICD-10-CM | POA: Diagnosis not present

## 2024-06-25 DIAGNOSIS — G8918 Other acute postprocedural pain: Secondary | ICD-10-CM | POA: Diagnosis not present

## 2024-06-25 DIAGNOSIS — M9901 Segmental and somatic dysfunction of cervical region: Secondary | ICD-10-CM | POA: Diagnosis not present

## 2024-06-25 DIAGNOSIS — M9908 Segmental and somatic dysfunction of rib cage: Secondary | ICD-10-CM | POA: Diagnosis not present

## 2024-06-25 DIAGNOSIS — M9902 Segmental and somatic dysfunction of thoracic region: Secondary | ICD-10-CM | POA: Diagnosis not present

## 2024-06-25 DIAGNOSIS — M9904 Segmental and somatic dysfunction of sacral region: Secondary | ICD-10-CM | POA: Diagnosis not present

## 2024-06-28 ENCOUNTER — Other Ambulatory Visit (HOSPITAL_COMMUNITY): Payer: Self-pay

## 2024-06-28 MED ORDER — COVID-19 MRNA VAC-TRIS(PFIZER) 30 MCG/0.3ML IM SUSY
PREFILLED_SYRINGE | INTRAMUSCULAR | 0 refills | Status: DC
  Filled 2024-06-28: qty 0.3, 1d supply, fill #0

## 2024-06-30 DIAGNOSIS — Z6830 Body mass index (BMI) 30.0-30.9, adult: Secondary | ICD-10-CM | POA: Diagnosis not present

## 2024-06-30 DIAGNOSIS — F439 Reaction to severe stress, unspecified: Secondary | ICD-10-CM | POA: Diagnosis not present

## 2024-06-30 DIAGNOSIS — E782 Mixed hyperlipidemia: Secondary | ICD-10-CM | POA: Diagnosis not present

## 2024-06-30 DIAGNOSIS — R7303 Prediabetes: Secondary | ICD-10-CM | POA: Diagnosis not present

## 2024-06-30 DIAGNOSIS — E663 Overweight: Secondary | ICD-10-CM | POA: Diagnosis not present

## 2024-06-30 DIAGNOSIS — Z8639 Personal history of other endocrine, nutritional and metabolic disease: Secondary | ICD-10-CM | POA: Diagnosis not present

## 2024-07-06 ENCOUNTER — Other Ambulatory Visit: Payer: Self-pay | Admitting: Family Medicine

## 2024-07-06 DIAGNOSIS — E663 Overweight: Secondary | ICD-10-CM | POA: Diagnosis not present

## 2024-07-06 DIAGNOSIS — E66811 Obesity, class 1: Secondary | ICD-10-CM | POA: Diagnosis not present

## 2024-07-06 DIAGNOSIS — I2510 Atherosclerotic heart disease of native coronary artery without angina pectoris: Secondary | ICD-10-CM | POA: Diagnosis not present

## 2024-07-06 DIAGNOSIS — F418 Other specified anxiety disorders: Secondary | ICD-10-CM | POA: Diagnosis not present

## 2024-07-06 DIAGNOSIS — E7800 Pure hypercholesterolemia, unspecified: Secondary | ICD-10-CM | POA: Diagnosis not present

## 2024-07-06 DIAGNOSIS — I10 Essential (primary) hypertension: Secondary | ICD-10-CM | POA: Diagnosis not present

## 2024-07-14 ENCOUNTER — Ambulatory Visit
Admission: RE | Admit: 2024-07-14 | Discharge: 2024-07-14 | Disposition: A | Discharge status: Home / Self Care | Source: Ambulatory Visit | Attending: Family Medicine | Admitting: Family Medicine

## 2024-07-14 DIAGNOSIS — Z1231 Encounter for screening mammogram for malignant neoplasm of breast: Secondary | ICD-10-CM | POA: Diagnosis not present

## 2024-07-18 DIAGNOSIS — I10 Essential (primary) hypertension: Secondary | ICD-10-CM | POA: Diagnosis not present

## 2024-07-18 DIAGNOSIS — I2510 Atherosclerotic heart disease of native coronary artery without angina pectoris: Secondary | ICD-10-CM | POA: Diagnosis not present

## 2024-07-19 ENCOUNTER — Other Ambulatory Visit: Payer: Self-pay | Admitting: Family Medicine

## 2024-07-19 DIAGNOSIS — E663 Overweight: Secondary | ICD-10-CM | POA: Diagnosis not present

## 2024-07-19 DIAGNOSIS — R928 Other abnormal and inconclusive findings on diagnostic imaging of breast: Secondary | ICD-10-CM

## 2024-07-19 DIAGNOSIS — F439 Reaction to severe stress, unspecified: Secondary | ICD-10-CM | POA: Diagnosis not present

## 2024-07-19 DIAGNOSIS — Z8639 Personal history of other endocrine, nutritional and metabolic disease: Secondary | ICD-10-CM | POA: Diagnosis not present

## 2024-07-19 DIAGNOSIS — R7303 Prediabetes: Secondary | ICD-10-CM | POA: Diagnosis not present

## 2024-07-19 DIAGNOSIS — Z6829 Body mass index (BMI) 29.0-29.9, adult: Secondary | ICD-10-CM | POA: Diagnosis not present

## 2024-07-19 DIAGNOSIS — E782 Mixed hyperlipidemia: Secondary | ICD-10-CM | POA: Diagnosis not present

## 2024-07-26 ENCOUNTER — Ambulatory Visit
Admission: RE | Admit: 2024-07-26 | Discharge: 2024-07-26 | Disposition: A | Discharge status: Home / Self Care | Source: Ambulatory Visit | Attending: Family Medicine | Admitting: Family Medicine

## 2024-07-26 ENCOUNTER — Ambulatory Visit
Admission: RE | Admit: 2024-07-26 | Discharge: 2024-07-26 | Disposition: A | Source: Ambulatory Visit | Attending: Family Medicine

## 2024-07-26 DIAGNOSIS — R928 Other abnormal and inconclusive findings on diagnostic imaging of breast: Secondary | ICD-10-CM | POA: Diagnosis not present

## 2024-07-26 DIAGNOSIS — N6001 Solitary cyst of right breast: Secondary | ICD-10-CM | POA: Diagnosis not present

## 2024-07-27 DIAGNOSIS — R42 Dizziness and giddiness: Secondary | ICD-10-CM | POA: Diagnosis not present

## 2024-07-29 ENCOUNTER — Encounter: Payer: Self-pay | Admitting: Medical Genetics

## 2024-07-29 DIAGNOSIS — Z006 Encounter for examination for normal comparison and control in clinical research program: Secondary | ICD-10-CM

## 2024-07-29 NOTE — Research (Signed)
 Study: [place] [employer]   Subject Name: Mary Hernandez   Participant Mataya Kilduff [phone]) was identified as a potential candidate for the above listed study. The informed consent documents were provided to the participant e-mail.   The participant did not date the signature line for the eligibility questions. The participant was contacted on 07/29/2024 to confirm eligibility. The patient was asked the eligibility questions. Confirmed the participant met eligibility criteria.   A copy of the informed consent form was provided to the subject for review. The patient was encouraged to review at their convenience with their support network, including other care providers.    The patient was provided with the informed consent questionnaire to demonstrate understanding of the informed consent form. The patient was provided with the correct answers to the informed consent questionnaire.    All the participant's questions and concerns regarding participation in the study and the informed consent were addressed to the patient's satisfaction.    The patient has agreed to participate in the above-listed research study and has voluntarily signed the informed consent form V5 03/09/2024. The patient was provided with a copy of the signed informed consent form for their reference.   The informed consent was obtained prior to performance of any protocol-specific procedures for the subject. A copy of the signed informed consent will be scanned in the subject's medical record.   Noemi Bimler, Ph.D. [facility]   [facility] Clinical Research Coordinator Direct Dial: [phone]

## 2024-08-06 DIAGNOSIS — I10 Essential (primary) hypertension: Secondary | ICD-10-CM | POA: Diagnosis not present

## 2024-08-06 DIAGNOSIS — I2510 Atherosclerotic heart disease of native coronary artery without angina pectoris: Secondary | ICD-10-CM | POA: Diagnosis not present

## 2024-08-09 ENCOUNTER — Other Ambulatory Visit: Payer: Self-pay | Admitting: Medical Genetics

## 2024-08-09 DIAGNOSIS — Z006 Encounter for examination for normal comparison and control in clinical research program: Secondary | ICD-10-CM

## 2024-08-13 DIAGNOSIS — F419 Anxiety disorder, unspecified: Secondary | ICD-10-CM | POA: Diagnosis not present

## 2024-08-13 DIAGNOSIS — R7303 Prediabetes: Secondary | ICD-10-CM | POA: Diagnosis not present

## 2024-08-13 DIAGNOSIS — E890 Postprocedural hypothyroidism: Secondary | ICD-10-CM | POA: Diagnosis not present

## 2024-08-13 DIAGNOSIS — Z0000 Encounter for general adult medical examination without abnormal findings: Secondary | ICD-10-CM | POA: Diagnosis not present

## 2024-08-13 DIAGNOSIS — R42 Dizziness and giddiness: Secondary | ICD-10-CM | POA: Diagnosis not present

## 2024-08-13 DIAGNOSIS — Z1331 Encounter for screening for depression: Secondary | ICD-10-CM | POA: Diagnosis not present

## 2024-08-13 DIAGNOSIS — E782 Mixed hyperlipidemia: Secondary | ICD-10-CM | POA: Diagnosis not present

## 2024-08-16 DIAGNOSIS — Z6829 Body mass index (BMI) 29.0-29.9, adult: Secondary | ICD-10-CM | POA: Diagnosis not present

## 2024-08-16 DIAGNOSIS — E782 Mixed hyperlipidemia: Secondary | ICD-10-CM | POA: Diagnosis not present

## 2024-08-16 DIAGNOSIS — I10 Essential (primary) hypertension: Secondary | ICD-10-CM | POA: Diagnosis not present

## 2024-08-16 DIAGNOSIS — E663 Overweight: Secondary | ICD-10-CM | POA: Diagnosis not present

## 2024-08-16 DIAGNOSIS — Z8639 Personal history of other endocrine, nutritional and metabolic disease: Secondary | ICD-10-CM | POA: Diagnosis not present

## 2024-08-16 DIAGNOSIS — F439 Reaction to severe stress, unspecified: Secondary | ICD-10-CM | POA: Diagnosis not present

## 2024-08-16 DIAGNOSIS — R7303 Prediabetes: Secondary | ICD-10-CM | POA: Diagnosis not present

## 2024-08-16 DIAGNOSIS — R42 Dizziness and giddiness: Secondary | ICD-10-CM | POA: Diagnosis not present

## 2024-08-20 ENCOUNTER — Other Ambulatory Visit: Payer: Self-pay | Admitting: Nurse Practitioner

## 2024-08-20 ENCOUNTER — Ambulatory Visit
Admission: RE | Admit: 2024-08-20 | Discharge: 2024-08-20 | Disposition: A | Discharge status: Home / Self Care | Source: Ambulatory Visit | Attending: Nurse Practitioner | Admitting: Nurse Practitioner

## 2024-08-20 DIAGNOSIS — H8103 Meniere's disease, bilateral: Secondary | ICD-10-CM | POA: Diagnosis not present

## 2024-08-20 DIAGNOSIS — M9902 Segmental and somatic dysfunction of thoracic region: Secondary | ICD-10-CM | POA: Diagnosis not present

## 2024-08-20 DIAGNOSIS — M9906 Segmental and somatic dysfunction of lower extremity: Secondary | ICD-10-CM | POA: Diagnosis not present

## 2024-08-20 DIAGNOSIS — M542 Cervicalgia: Secondary | ICD-10-CM | POA: Diagnosis not present

## 2024-08-20 DIAGNOSIS — M9908 Segmental and somatic dysfunction of rib cage: Secondary | ICD-10-CM | POA: Diagnosis not present

## 2024-08-20 DIAGNOSIS — M25551 Pain in right hip: Secondary | ICD-10-CM

## 2024-08-20 DIAGNOSIS — M9904 Segmental and somatic dysfunction of sacral region: Secondary | ICD-10-CM | POA: Diagnosis not present

## 2024-08-20 DIAGNOSIS — M9901 Segmental and somatic dysfunction of cervical region: Secondary | ICD-10-CM | POA: Diagnosis not present

## 2024-08-20 DIAGNOSIS — M9903 Segmental and somatic dysfunction of lumbar region: Secondary | ICD-10-CM | POA: Diagnosis not present

## 2024-08-20 DIAGNOSIS — R42 Dizziness and giddiness: Secondary | ICD-10-CM | POA: Diagnosis not present

## 2024-08-20 DIAGNOSIS — M9905 Segmental and somatic dysfunction of pelvic region: Secondary | ICD-10-CM | POA: Diagnosis not present

## 2024-08-24 DIAGNOSIS — M9904 Segmental and somatic dysfunction of sacral region: Secondary | ICD-10-CM | POA: Diagnosis not present

## 2024-08-24 DIAGNOSIS — M9906 Segmental and somatic dysfunction of lower extremity: Secondary | ICD-10-CM | POA: Diagnosis not present

## 2024-08-24 DIAGNOSIS — M25551 Pain in right hip: Secondary | ICD-10-CM | POA: Diagnosis not present

## 2024-08-24 DIAGNOSIS — G8929 Other chronic pain: Secondary | ICD-10-CM | POA: Diagnosis not present

## 2024-08-24 DIAGNOSIS — M9902 Segmental and somatic dysfunction of thoracic region: Secondary | ICD-10-CM | POA: Diagnosis not present

## 2024-08-24 DIAGNOSIS — M9905 Segmental and somatic dysfunction of pelvic region: Secondary | ICD-10-CM | POA: Diagnosis not present

## 2024-08-24 DIAGNOSIS — M545 Low back pain, unspecified: Secondary | ICD-10-CM | POA: Diagnosis not present

## 2024-08-24 DIAGNOSIS — M436 Torticollis: Secondary | ICD-10-CM | POA: Diagnosis not present

## 2024-08-24 DIAGNOSIS — M9903 Segmental and somatic dysfunction of lumbar region: Secondary | ICD-10-CM | POA: Diagnosis not present

## 2024-08-30 DIAGNOSIS — I2510 Atherosclerotic heart disease of native coronary artery without angina pectoris: Secondary | ICD-10-CM | POA: Diagnosis not present

## 2024-08-30 DIAGNOSIS — I10 Essential (primary) hypertension: Secondary | ICD-10-CM | POA: Diagnosis not present

## 2024-09-03 LAB — GENECONNECT MOLECULAR SCREEN: Genetic Analysis Overall Interpretation: NEGATIVE

## 2024-09-05 DIAGNOSIS — E66811 Obesity, class 1: Secondary | ICD-10-CM | POA: Diagnosis not present

## 2024-09-05 DIAGNOSIS — I2510 Atherosclerotic heart disease of native coronary artery without angina pectoris: Secondary | ICD-10-CM | POA: Diagnosis not present

## 2024-09-05 DIAGNOSIS — E7800 Pure hypercholesterolemia, unspecified: Secondary | ICD-10-CM | POA: Diagnosis not present

## 2024-09-05 DIAGNOSIS — F418 Other specified anxiety disorders: Secondary | ICD-10-CM | POA: Diagnosis not present

## 2024-09-05 DIAGNOSIS — E663 Overweight: Secondary | ICD-10-CM | POA: Diagnosis not present

## 2024-09-05 DIAGNOSIS — I10 Essential (primary) hypertension: Secondary | ICD-10-CM | POA: Diagnosis not present

## 2024-09-06 DIAGNOSIS — F439 Reaction to severe stress, unspecified: Secondary | ICD-10-CM | POA: Diagnosis not present

## 2024-09-06 DIAGNOSIS — E782 Mixed hyperlipidemia: Secondary | ICD-10-CM | POA: Diagnosis not present

## 2024-09-06 DIAGNOSIS — Z6828 Body mass index (BMI) 28.0-28.9, adult: Secondary | ICD-10-CM | POA: Diagnosis not present

## 2024-09-06 DIAGNOSIS — R7303 Prediabetes: Secondary | ICD-10-CM | POA: Diagnosis not present

## 2024-09-06 DIAGNOSIS — E663 Overweight: Secondary | ICD-10-CM | POA: Diagnosis not present

## 2024-09-06 DIAGNOSIS — I10 Essential (primary) hypertension: Secondary | ICD-10-CM | POA: Diagnosis not present

## 2024-09-06 DIAGNOSIS — Z8639 Personal history of other endocrine, nutritional and metabolic disease: Secondary | ICD-10-CM | POA: Diagnosis not present

## 2024-09-08 DIAGNOSIS — H8103 Meniere's disease, bilateral: Secondary | ICD-10-CM | POA: Diagnosis not present

## 2024-09-08 DIAGNOSIS — G43909 Migraine, unspecified, not intractable, without status migrainosus: Secondary | ICD-10-CM | POA: Diagnosis not present

## 2024-09-08 DIAGNOSIS — R42 Dizziness and giddiness: Secondary | ICD-10-CM | POA: Diagnosis not present

## 2024-09-10 DIAGNOSIS — M9908 Segmental and somatic dysfunction of rib cage: Secondary | ICD-10-CM | POA: Diagnosis not present

## 2024-09-10 DIAGNOSIS — M9902 Segmental and somatic dysfunction of thoracic region: Secondary | ICD-10-CM | POA: Diagnosis not present

## 2024-09-10 DIAGNOSIS — M542 Cervicalgia: Secondary | ICD-10-CM | POA: Diagnosis not present

## 2024-09-10 DIAGNOSIS — M9905 Segmental and somatic dysfunction of pelvic region: Secondary | ICD-10-CM | POA: Diagnosis not present

## 2024-09-10 DIAGNOSIS — M9906 Segmental and somatic dysfunction of lower extremity: Secondary | ICD-10-CM | POA: Diagnosis not present

## 2024-09-10 DIAGNOSIS — R519 Headache, unspecified: Secondary | ICD-10-CM | POA: Diagnosis not present

## 2024-09-10 DIAGNOSIS — M25551 Pain in right hip: Secondary | ICD-10-CM | POA: Diagnosis not present

## 2024-09-10 DIAGNOSIS — M9901 Segmental and somatic dysfunction of cervical region: Secondary | ICD-10-CM | POA: Diagnosis not present

## 2024-09-10 DIAGNOSIS — G8929 Other chronic pain: Secondary | ICD-10-CM | POA: Diagnosis not present

## 2024-09-10 DIAGNOSIS — R42 Dizziness and giddiness: Secondary | ICD-10-CM | POA: Diagnosis not present

## 2024-09-10 DIAGNOSIS — M9904 Segmental and somatic dysfunction of sacral region: Secondary | ICD-10-CM | POA: Diagnosis not present

## 2024-09-10 DIAGNOSIS — M9903 Segmental and somatic dysfunction of lumbar region: Secondary | ICD-10-CM | POA: Diagnosis not present

## 2024-09-16 ENCOUNTER — Encounter: Payer: Self-pay | Admitting: Neurology

## 2024-11-02 NOTE — Progress Notes (Unsigned)
 "  NEUROLOGY CONSULTATION NOTE  Mary Hernandez MRN: [id number] DOB: 1951/03/16  Referring provider: Vernell Fort, MD Primary care provider: Vernell Fort, MD  Reason for consult:  migraine  Assessment/Plan:   ***   Subjective:  Mary Hernandez is a 74 year old ***-handed female with hypothyroidism, Meniere's disease, IBS and HLD who presents for migraines.  History supplemented by referring provider's note.  She began experiencing episodic ***  Past NSAIDS/analgesics:  *** Past abortive triptans:  *** Past abortive ergotamine:  *** Past muscle relaxants:  *** Past anti-emetic:  *** Past antihypertensive medications:  *** Past antidepressant/antipsychotic medications:  *** Past anticonvulsant medications:  *** Past anti-CGRP:  *** Past vitamins/Herbal/Supplements:  *** Past antihistamines/decongestants:  *** Other past therapies:  ***  Current NSAIDS/analgesics:  *** Current triptans:  *** Current ergotamine:  *** Current anti-emetic:  *** Current muscle relaxants:  *** Current Antihypertensive medications:  *** Current Antidepressant/antipsychotic medications:  *** Current Anticonvulsant medications:  *** Current anti-CGRP:  *** Current Vitamins/Herbal/Supplements:  *** Current Antihistamines/Decongestants:  *** Other therapy:  *** Birth control:  *** Other medications:  ***   Caffeine:  *** Alcohol:  *** Smoker:  *** Diet:  *** Exercise:  *** Depression:  ***; Anxiety:  *** Other pain:  *** Sleep hygiene:  ***  History of TBI/concussion:  *** Family history of headache:  *** Family history of cerebral aneurysm:  ***      PAST MEDICAL HISTORY: Past Medical History:  Diagnosis Date   Acquired hypothyroidism 03/01/2018   hx of goiter; s/p thyroidectomy   Anxiety    Atrial premature depolarization    BPPV (benign paroxysmal positional vertigo) 03/01/2018   Colon polyps    Complication of anesthesia    takes longer to wake up   Frequent PVCs  03/01/2018   Controlled on Flecainide , Acebutolol    Gallbladder problem    GERD (gastroesophageal reflux disease)    Hip pain    History of cardiac catheterization    Nuc 7/16:  normal perfusion; EF 76 // LHC 8/16:  normal coronary arteries   History of depression    History of dizziness    History of echocardiogram    Echo 03/2006:  Normal LVEF   History of stomach ulcers    HLD (hyperlipidemia)    Hypothyroidism    IBS (irritable bowel syndrome)    Infertility, female    Insulin  resistance    Lower back pain    Meniere's disease    Notalgia paresthetica    PONV (postoperative nausea and vomiting)    Prediabetes    PVC (premature ventricular contraction)    Vertigo    Vitamin D deficiency     PAST SURGICAL HISTORY: Past Surgical History:  Procedure Laterality Date   BLADDER SURGERY  1997   BREAST BIOPSY  2013   BREAST REDUCTION SURGERY  1999   CARDIAC CATHETERIZATION  05/2015   Carpel Tunnel Release     CHOLECYSTECTOMY  2011   COLONOSCOPY  08/20/2015   ESOPHAGOGASTRODUODENOSCOPY  01/13/2017   FOOT NEUROMA SURGERY  1986   REDUCTION MAMMAPLASTY     REFRACTIVE SURGERY     S/P Eye Right 1967   Muscle Clipped    THYROIDECTOMY  2012   TONSILLECTOMY  1975   TOTAL ABDOMINAL HYSTERECTOMY  1996   TRIGGER FINGER RELEASE Right 02/20/2022   Procedure: RIGHT MIDDLE AND RING FINGER RELEASE TRIGGER FINGER/A-1 PULLEY;  Surgeon: Romona Harari, MD;  Location: [facility];  Service: Orthopedics;  Laterality:  Right;    MEDICATIONS: Medications Ordered Prior to Encounter[1]  ALLERGIES: Allergies[2]  FAMILY HISTORY: Family History  Problem Relation Age of Onset   Breast cancer Mother    Hyperlipidemia Mother    Thyroid  disease Mother    Cancer Mother    Heart attack Father    Pulmonary fibrosis Father    Peripheral Artery Disease Father        s/p CEA   Heart disease Father    Hyperlipidemia Father    Early death Sister    Depression Brother    Hearing  loss Brother    Heart disease Paternal Grandmother    Diabetes Daughter    Colon cancer Neg Hx    Stomach cancer Neg Hx    Esophageal cancer Neg Hx    Rectal cancer Neg Hx     Objective:  *** General: No acute distress.  Patient appears well-groomed.   Head:  Normocephalic/atraumatic Eyes:  fundi examined but not visualized Neck: supple, no paraspinal tenderness, full range of motion Heart: regular rate and rhythm Neurological Exam: Mental status: alert and oriented to person, place, and time, speech fluent and not dysarthric, language intact. Cranial nerves: CN I: not tested CN II: pupils equal, round and reactive to light, visual fields intact CN III, IV, VI:  full range of motion, no nystagmus, no ptosis CN V: facial sensation intact. CN VII: upper and lower face symmetric CN VIII: hearing intact CN IX, X: gag intact, uvula midline CN XI: sternocleidomastoid and trapezius muscles intact CN XII: tongue midline Bulk & Tone: normal, no fasciculations. Motor:  muscle strength 5/5 throughout Sensation:  Pinprick and vibratory sensation intact. Deep Tendon Reflexes:  2+ throughout,  toes downgoing.   Finger to nose testing:  Without dysmetria.   Gait:  Normal station and stride.  Romberg negative.    Juliene Dunnings, DO  CC: Vernell Fort, MD         [1]  Current Outpatient Medications on File Prior to Visit  Medication Sig Dispense Refill   amitriptyline  (ELAVIL ) 10 MG tablet Take 10 mg by mouth at bedtime.     Calcium  Citrate-Vitamin D 315-250 MG-UNIT TABS Take by mouth.     COVID-19 mRNA vaccine, Pfizer, (COMIRNATY ) syringe Inject into the muscle. 0.3 mL 0   diclofenac  Sodium (VOLTAREN ) 1 % GEL Apply 2 g topically 4 (four) times daily. (Patient taking differently: Apply 2 g topically as needed.) 350 g 5   EVENING PRIMROSE OIL PO Take 1,500 mg by mouth 2 (two) times daily.     flecainide  (TAMBOCOR ) 50 MG tablet Take 1 tablet (50 mg total) by mouth 2 (two) times daily.  180 tablet 3   fluticasone  (FLONASE ) 50 MCG/ACT nasal spray SPRAY ONE SPRAY IN EACH NOSTRIL ONCE DAILY 16 mL 2   Glucosamine-Chondroit-Vit C-Mn (GLUCOSAMINE 1500 COMPLEX) CAPS Take by mouth.     glycopyrrolate  (ROBINUL ) 1 MG tablet Take 1 tablet (1 mg total) by mouth 2 (two) times daily. 180 tablet 3   hydrochlorothiazide  (MICROZIDE ) 12.5 MG capsule Take 1 capsule (12.5 mg total) by mouth every other day.     levothyroxine  (SYNTHROID ) 100 MCG tablet TAKE 1 TABLET BY MOUTH DAILY 90 tablet 3   LORazepam (ATIVAN) 0.5 MG tablet Take by mouth.     MAGNESIUM PO Take by mouth. Vit C & D     meclizine (ANTIVERT) 25 MG tablet Take 25 mg by mouth 3 (three) times daily as needed for dizziness.     meloxicam (  MOBIC) 7.5 MG tablet Take 7.5 mg by mouth as needed.     montelukast  (SINGULAIR ) 10 MG tablet Take 1 tablet (10 mg total) by mouth at bedtime. 90 tablet 3   naltrexone (DEPADE) 50 MG tablet Take 50 mg by mouth in the morning and at bedtime.     omega-3 acid ethyl esters (LOVAZA) 1 g capsule Take 1 g by mouth 2 (two) times daily.     omeprazole  (PRILOSEC) 40 MG capsule Take 40 mg by mouth 2 (two) times daily.     ondansetron  (ZOFRAN -ODT) 4 MG disintegrating tablet Take 4 mg by mouth every 8 (eight) hours as needed for nausea or vomiting.     Probiotic Product (PROBIOTIC-10 PO) Take 1 capsule by mouth daily.     rosuvastatin  (CRESTOR ) 10 MG tablet TAKE 1 TABLET BY MOUTH DAILY 90 tablet 0   Semaglutide , 2 MG/DOSE, (OZEMPIC , 2 MG/DOSE,) 8 MG/3ML SOPN Inject 2 mg into the skin once a week. (Patient not taking: Reported on 05/31/2024) 9 mL 1   semaglutide -weight management (WEGOVY ) 2.4 MG/0.75ML SOAJ SQ injection inject 2.4mg  Subcutaneous once weekly; Duration: 30 days     sertraline  (ZOLOFT ) 50 MG tablet Take 2 tablets (100 mg total) by mouth daily. 180 tablet 3   Turmeric 400 MG CAPS Take 3 capsules by mouth daily.     Vitamin D-Vitamin K (D3 + K2 DOTS PO) Take by mouth.     No current  facility-administered medications on file prior to visit.  [2]  Allergies Allergen Reactions   Adhesive [Tape] Rash   "

## 2024-11-03 ENCOUNTER — Ambulatory Visit: Admitting: Neurology

## 2024-11-08 NOTE — Progress Notes (Unsigned)
 "  NEUROLOGY CONSULTATION NOTE  Mary Hernandez MRN: [id number] DOB: 1951/03/16  Referring provider: Vernell Fort, MD Primary care provider: Vernell Fort, MD  Reason for consult:  migraine  Assessment/Plan:   ***   Subjective:  Mary Hernandez is a 74 year old ***-handed female with hypothyroidism, Meniere's disease, IBS and HLD who presents for migraines.  History supplemented by referring provider's note.  She began experiencing episodic ***  Past NSAIDS/analgesics:  *** Past abortive triptans:  *** Past abortive ergotamine:  *** Past muscle relaxants:  *** Past anti-emetic:  *** Past antihypertensive medications:  *** Past antidepressant/antipsychotic medications:  *** Past anticonvulsant medications:  *** Past anti-CGRP:  *** Past vitamins/Herbal/Supplements:  *** Past antihistamines/decongestants:  *** Other past therapies:  ***  Current NSAIDS/analgesics:  *** Current triptans:  *** Current ergotamine:  *** Current anti-emetic:  *** Current muscle relaxants:  *** Current Antihypertensive medications:  *** Current Antidepressant/antipsychotic medications:  *** Current Anticonvulsant medications:  *** Current anti-CGRP:  *** Current Vitamins/Herbal/Supplements:  *** Current Antihistamines/Decongestants:  *** Other therapy:  *** Birth control:  *** Other medications:  ***   Caffeine:  *** Alcohol:  *** Smoker:  *** Diet:  *** Exercise:  *** Depression:  ***; Anxiety:  *** Other pain:  *** Sleep hygiene:  ***  History of TBI/concussion:  *** Family history of headache:  *** Family history of cerebral aneurysm:  ***      PAST MEDICAL HISTORY: Past Medical History:  Diagnosis Date   Acquired hypothyroidism 03/01/2018   hx of goiter; s/p thyroidectomy   Anxiety    Atrial premature depolarization    BPPV (benign paroxysmal positional vertigo) 03/01/2018   Colon polyps    Complication of anesthesia    takes longer to wake up   Frequent PVCs  03/01/2018   Controlled on Flecainide , Acebutolol    Gallbladder problem    GERD (gastroesophageal reflux disease)    Hip pain    History of cardiac catheterization    Nuc 7/16:  normal perfusion; EF 76 // LHC 8/16:  normal coronary arteries   History of depression    History of dizziness    History of echocardiogram    Echo 03/2006:  Normal LVEF   History of stomach ulcers    HLD (hyperlipidemia)    Hypothyroidism    IBS (irritable bowel syndrome)    Infertility, female    Insulin  resistance    Lower back pain    Meniere's disease    Notalgia paresthetica    PONV (postoperative nausea and vomiting)    Prediabetes    PVC (premature ventricular contraction)    Vertigo    Vitamin D deficiency     PAST SURGICAL HISTORY: Past Surgical History:  Procedure Laterality Date   BLADDER SURGERY  1997   BREAST BIOPSY  2013   BREAST REDUCTION SURGERY  1999   CARDIAC CATHETERIZATION  05/2015   Carpel Tunnel Release     CHOLECYSTECTOMY  2011   COLONOSCOPY  08/20/2015   ESOPHAGOGASTRODUODENOSCOPY  01/13/2017   FOOT NEUROMA SURGERY  1986   REDUCTION MAMMAPLASTY     REFRACTIVE SURGERY     S/P Eye Right 1967   Muscle Clipped    THYROIDECTOMY  2012   TONSILLECTOMY  1975   TOTAL ABDOMINAL HYSTERECTOMY  1996   TRIGGER FINGER RELEASE Right 02/20/2022   Procedure: RIGHT MIDDLE AND RING FINGER RELEASE TRIGGER FINGER/A-1 PULLEY;  Surgeon: Romona Harari, MD;  Location: [facility];  Service: Orthopedics;  Laterality:  Right;    MEDICATIONS: Medications Ordered Prior to Encounter[1]  ALLERGIES: Allergies[2]  FAMILY HISTORY: Family History  Problem Relation Age of Onset   Breast cancer Mother    Hyperlipidemia Mother    Thyroid  disease Mother    Cancer Mother    Heart attack Father    Pulmonary fibrosis Father    Peripheral Artery Disease Father        s/p CEA   Heart disease Father    Hyperlipidemia Father    Early death Sister    Depression Brother    Hearing  loss Brother    Heart disease Paternal Grandmother    Diabetes Daughter    Colon cancer Neg Hx    Stomach cancer Neg Hx    Esophageal cancer Neg Hx    Rectal cancer Neg Hx     Objective:  *** General: No acute distress.  Patient appears well-groomed.   Head:  Normocephalic/atraumatic Eyes:  fundi examined but not visualized Neck: supple, no paraspinal tenderness, full range of motion Heart: regular rate and rhythm Neurological Exam: Mental status: alert and oriented to person, place, and time, speech fluent and not dysarthric, language intact. Cranial nerves: CN I: not tested CN II: pupils equal, round and reactive to light, visual fields intact CN III, IV, VI:  full range of motion, no nystagmus, no ptosis CN V: facial sensation intact. CN VII: upper and lower face symmetric CN VIII: hearing intact CN IX, X: gag intact, uvula midline CN XI: sternocleidomastoid and trapezius muscles intact CN XII: tongue midline Bulk & Tone: normal, no fasciculations. Motor:  muscle strength 5/5 throughout Sensation:  Pinprick and vibratory sensation intact. Deep Tendon Reflexes:  2+ throughout,  toes downgoing.   Finger to nose testing:  Without dysmetria.   Gait:  Normal station and stride.  Romberg negative.    Juliene Dunnings, DO  CC: Vernell Fort, MD         [1]  Current Outpatient Medications on File Prior to Visit  Medication Sig Dispense Refill   amitriptyline  (ELAVIL ) 10 MG tablet Take 10 mg by mouth at bedtime.     Calcium  Citrate-Vitamin D 315-250 MG-UNIT TABS Take by mouth.     COVID-19 mRNA vaccine, Pfizer, (COMIRNATY ) syringe Inject into the muscle. 0.3 mL 0   diclofenac  Sodium (VOLTAREN ) 1 % GEL Apply 2 g topically 4 (four) times daily. (Patient taking differently: Apply 2 g topically as needed.) 350 g 5   EVENING PRIMROSE OIL PO Take 1,500 mg by mouth 2 (two) times daily.     flecainide  (TAMBOCOR ) 50 MG tablet Take 1 tablet (50 mg total) by mouth 2 (two) times daily.  180 tablet 3   fluticasone  (FLONASE ) 50 MCG/ACT nasal spray SPRAY ONE SPRAY IN EACH NOSTRIL ONCE DAILY 16 mL 2   Glucosamine-Chondroit-Vit C-Mn (GLUCOSAMINE 1500 COMPLEX) CAPS Take by mouth.     glycopyrrolate  (ROBINUL ) 1 MG tablet Take 1 tablet (1 mg total) by mouth 2 (two) times daily. 180 tablet 3   hydrochlorothiazide  (MICROZIDE ) 12.5 MG capsule Take 1 capsule (12.5 mg total) by mouth every other day.     levothyroxine  (SYNTHROID ) 100 MCG tablet TAKE 1 TABLET BY MOUTH DAILY 90 tablet 3   LORazepam (ATIVAN) 0.5 MG tablet Take by mouth.     MAGNESIUM PO Take by mouth. Vit C & D     meclizine (ANTIVERT) 25 MG tablet Take 25 mg by mouth 3 (three) times daily as needed for dizziness.     meloxicam (  MOBIC) 7.5 MG tablet Take 7.5 mg by mouth as needed.     montelukast  (SINGULAIR ) 10 MG tablet Take 1 tablet (10 mg total) by mouth at bedtime. 90 tablet 3   naltrexone (DEPADE) 50 MG tablet Take 50 mg by mouth in the morning and at bedtime.     omega-3 acid ethyl esters (LOVAZA) 1 g capsule Take 1 g by mouth 2 (two) times daily.     omeprazole  (PRILOSEC) 40 MG capsule Take 40 mg by mouth 2 (two) times daily.     ondansetron  (ZOFRAN -ODT) 4 MG disintegrating tablet Take 4 mg by mouth every 8 (eight) hours as needed for nausea or vomiting.     Probiotic Product (PROBIOTIC-10 PO) Take 1 capsule by mouth daily.     rosuvastatin  (CRESTOR ) 10 MG tablet TAKE 1 TABLET BY MOUTH DAILY 90 tablet 0   Semaglutide , 2 MG/DOSE, (OZEMPIC , 2 MG/DOSE,) 8 MG/3ML SOPN Inject 2 mg into the skin once a week. (Patient not taking: Reported on 05/31/2024) 9 mL 1   semaglutide -weight management (WEGOVY ) 2.4 MG/0.75ML SOAJ SQ injection inject 2.4mg  Subcutaneous once weekly; Duration: 30 days     sertraline  (ZOLOFT ) 50 MG tablet Take 2 tablets (100 mg total) by mouth daily. 180 tablet 3   Turmeric 400 MG CAPS Take 3 capsules by mouth daily.     Vitamin D-Vitamin K (D3 + K2 DOTS PO) Take by mouth.     No current  facility-administered medications on file prior to visit.  [2]  Allergies Allergen Reactions   Adhesive [Tape] Rash   "

## 2024-11-09 ENCOUNTER — Ambulatory Visit: Payer: Self-pay | Admitting: Neurology

## 2024-11-09 ENCOUNTER — Encounter: Payer: Self-pay | Admitting: Neurology

## 2024-11-09 VITALS — BP 104/66 | HR 71 | Ht 64.0 in | Wt 171.2 lb

## 2024-11-09 DIAGNOSIS — G43009 Migraine without aura, not intractable, without status migrainosus: Secondary | ICD-10-CM

## 2024-11-09 DIAGNOSIS — M5481 Occipital neuralgia: Secondary | ICD-10-CM | POA: Diagnosis not present

## 2024-11-09 DIAGNOSIS — H8103 Meniere's disease, bilateral: Secondary | ICD-10-CM

## 2024-11-09 NOTE — Progress Notes (Signed)
 Medication Samples have been provided to the patient.  Drug name: nurtec       Strength: 75 mg        Qty: 2   LOT: [id number]  Exp.Date: 11/28  Dosing instructions: as needed  The patient has been instructed regarding the correct time, dose, and frequency of taking this medication, including desired effects and most common side effects.   Mary Hernandez 12:00 PM 11/09/2024

## 2024-12-08 ENCOUNTER — Ambulatory Visit: Admitting: Cardiology

## 2024-12-30 ENCOUNTER — Ambulatory Visit: Payer: Self-pay | Admitting: Neurology

## 2025-05-24 ENCOUNTER — Ambulatory Visit: Payer: Self-pay | Admitting: Neurology
